# Patient Record
Sex: Male | Born: 1945
Health system: Southern US, Community
[De-identification: ages and names within clinical notes are randomized; demographics above are authoritative.]

## PROBLEM LIST (undated history)

## (undated) DIAGNOSIS — D589 Hereditary hemolytic anemia, unspecified: Principal | ICD-10-CM

## (undated) DIAGNOSIS — R011 Cardiac murmur, unspecified: Secondary | ICD-10-CM

## (undated) DIAGNOSIS — Z9989 Dependence on other enabling machines and devices: Secondary | ICD-10-CM

## (undated) DIAGNOSIS — R0600 Dyspnea, unspecified: Secondary | ICD-10-CM

## (undated) DIAGNOSIS — I77810 Thoracic aortic ectasia: Secondary | ICD-10-CM

## (undated) DIAGNOSIS — I499 Cardiac arrhythmia, unspecified: Secondary | ICD-10-CM

## (undated) DIAGNOSIS — C801 Malignant (primary) neoplasm, unspecified: Secondary | ICD-10-CM

## (undated) DIAGNOSIS — J449 Chronic obstructive pulmonary disease, unspecified: Secondary | ICD-10-CM

## (undated) DIAGNOSIS — G4733 Obstructive sleep apnea (adult) (pediatric): Secondary | ICD-10-CM

## (undated) DIAGNOSIS — R17 Unspecified jaundice: Secondary | ICD-10-CM

## (undated) DIAGNOSIS — I10 Essential (primary) hypertension: Secondary | ICD-10-CM

## (undated) DIAGNOSIS — I480 Paroxysmal atrial fibrillation: Secondary | ICD-10-CM

## (undated) DIAGNOSIS — R0609 Other forms of dyspnea: Secondary | ICD-10-CM

## (undated) DIAGNOSIS — R7303 Prediabetes: Secondary | ICD-10-CM

## (undated) DIAGNOSIS — K429 Umbilical hernia without obstruction or gangrene: Secondary | ICD-10-CM

## (undated) DIAGNOSIS — E785 Hyperlipidemia, unspecified: Secondary | ICD-10-CM

## (undated) DIAGNOSIS — Z9889 Other specified postprocedural states: Secondary | ICD-10-CM

## (undated) DIAGNOSIS — D591 Other autoimmune hemolytic anemias: Principal | ICD-10-CM

## (undated) DIAGNOSIS — M1990 Unspecified osteoarthritis, unspecified site: Secondary | ICD-10-CM

## (undated) DIAGNOSIS — K760 Fatty (change of) liver, not elsewhere classified: Secondary | ICD-10-CM

## (undated) HISTORY — DX: Chronic obstructive pulmonary disease, unspecified: J44.9

## (undated) HISTORY — DX: Hyperlipidemia, unspecified: E78.5

## (undated) HISTORY — DX: Essential (primary) hypertension: I10

## (undated) HISTORY — PX: SKIN SURGERY: SHX2413

## (undated) HISTORY — DX: Dependence on other enabling machines and devices: Z99.89

## (undated) HISTORY — DX: Obstructive sleep apnea (adult) (pediatric): G47.33

## (undated) HISTORY — DX: Other specified postprocedural states: Z98.890

## (undated) HISTORY — DX: Thoracic aortic ectasia: I77.810

## (undated) HISTORY — DX: Prediabetes: R73.03

## (undated) HISTORY — DX: Unspecified jaundice: R17

## (undated) HISTORY — DX: Hereditary hemolytic anemia, unspecified: D58.9

## (undated) HISTORY — PX: ADENOIDECTOMY: SUR15

## (undated) HISTORY — DX: Paroxysmal atrial fibrillation: I48.0

## (undated) HISTORY — DX: Fatty (change of) liver, not elsewhere classified: K76.0

## (undated) HISTORY — DX: Other forms of dyspnea: R06.09

## (undated) HISTORY — DX: Umbilical hernia without obstruction or gangrene: K42.9

## (undated) HISTORY — DX: Dyspnea, unspecified: R06.00

## (undated) HISTORY — DX: Nonrheumatic aortic (valve) stenosis: I35.0

## (undated) HISTORY — DX: Other autoimmune hemolytic anemias: D59.1

## (undated) HISTORY — PX: TONSILLECTOMY: SUR1361

---

## 2000-05-14 ENCOUNTER — Ambulatory Visit (HOSPITAL_COMMUNITY): Admission: RE | Admit: 2000-05-14 | Discharge: 2000-05-14 | Payer: Self-pay | Admitting: Gastroenterology

## 2000-05-23 ENCOUNTER — Inpatient Hospital Stay (HOSPITAL_COMMUNITY): Admission: EM | Admit: 2000-05-23 | Discharge: 2000-05-24 | Payer: Self-pay | Admitting: *Deleted

## 2000-06-10 ENCOUNTER — Encounter: Admission: RE | Admit: 2000-06-10 | Discharge: 2000-06-10 | Payer: Self-pay | Admitting: Family Medicine

## 2000-06-10 ENCOUNTER — Encounter: Payer: Self-pay | Admitting: Family Medicine

## 2002-03-04 ENCOUNTER — Emergency Department (HOSPITAL_COMMUNITY): Admission: EM | Admit: 2002-03-04 | Discharge: 2002-03-04 | Payer: Self-pay | Admitting: Emergency Medicine

## 2002-03-04 ENCOUNTER — Encounter: Payer: Self-pay | Admitting: Family Medicine

## 2003-02-16 ENCOUNTER — Ambulatory Visit (HOSPITAL_BASED_OUTPATIENT_CLINIC_OR_DEPARTMENT_OTHER): Admission: RE | Admit: 2003-02-16 | Discharge: 2003-02-16 | Payer: Self-pay | Admitting: Pulmonary Disease

## 2003-03-13 HISTORY — PX: NECK SURGERY: SHX720

## 2003-07-14 ENCOUNTER — Inpatient Hospital Stay (HOSPITAL_COMMUNITY): Admission: RE | Admit: 2003-07-14 | Discharge: 2003-07-15 | Payer: Self-pay | Admitting: Neurosurgery

## 2004-10-10 ENCOUNTER — Ambulatory Visit: Payer: Self-pay | Admitting: Cardiology

## 2005-05-01 ENCOUNTER — Ambulatory Visit: Payer: Self-pay | Admitting: Cardiovascular Disease

## 2005-05-07 ENCOUNTER — Ambulatory Visit: Payer: Self-pay | Admitting: Internal Medicine

## 2005-06-12 ENCOUNTER — Ambulatory Visit: Payer: Self-pay | Admitting: Cardiology

## 2005-07-24 ENCOUNTER — Ambulatory Visit: Payer: Self-pay | Admitting: Cardiology

## 2006-01-25 ENCOUNTER — Ambulatory Visit: Payer: Self-pay | Admitting: Cardiology

## 2007-02-27 ENCOUNTER — Ambulatory Visit: Payer: Self-pay | Admitting: Cardiology

## 2007-11-27 DIAGNOSIS — K2100 Gastro-esophageal reflux disease with esophagitis, without bleeding: Secondary | ICD-10-CM | POA: Insufficient documentation

## 2008-02-10 ENCOUNTER — Ambulatory Visit: Payer: Self-pay | Admitting: Cardiology

## 2008-02-13 ENCOUNTER — Ambulatory Visit: Payer: Self-pay

## 2008-03-01 DIAGNOSIS — E785 Hyperlipidemia, unspecified: Secondary | ICD-10-CM | POA: Insufficient documentation

## 2008-03-01 DIAGNOSIS — I10 Essential (primary) hypertension: Secondary | ICD-10-CM | POA: Insufficient documentation

## 2008-03-02 ENCOUNTER — Ambulatory Visit: Payer: Self-pay | Admitting: Critical Care Medicine

## 2008-03-02 DIAGNOSIS — G4733 Obstructive sleep apnea (adult) (pediatric): Secondary | ICD-10-CM | POA: Insufficient documentation

## 2008-03-02 DIAGNOSIS — R0989 Other specified symptoms and signs involving the circulatory and respiratory systems: Secondary | ICD-10-CM

## 2008-03-02 DIAGNOSIS — R0609 Other forms of dyspnea: Secondary | ICD-10-CM | POA: Insufficient documentation

## 2008-03-15 ENCOUNTER — Ambulatory Visit: Payer: Self-pay | Admitting: Critical Care Medicine

## 2008-03-18 ENCOUNTER — Encounter: Payer: Self-pay | Admitting: Critical Care Medicine

## 2008-03-24 ENCOUNTER — Ambulatory Visit: Payer: Self-pay | Admitting: Critical Care Medicine

## 2008-03-31 ENCOUNTER — Telehealth (INDEPENDENT_AMBULATORY_CARE_PROVIDER_SITE_OTHER): Payer: Self-pay | Admitting: *Deleted

## 2008-12-01 ENCOUNTER — Encounter (INDEPENDENT_AMBULATORY_CARE_PROVIDER_SITE_OTHER): Payer: Self-pay | Admitting: *Deleted

## 2008-12-15 DIAGNOSIS — I10 Essential (primary) hypertension: Secondary | ICD-10-CM | POA: Insufficient documentation

## 2009-01-11 ENCOUNTER — Ambulatory Visit: Payer: Self-pay | Admitting: Cardiology

## 2009-01-11 ENCOUNTER — Encounter (INDEPENDENT_AMBULATORY_CARE_PROVIDER_SITE_OTHER): Payer: Self-pay | Admitting: *Deleted

## 2009-02-14 ENCOUNTER — Telehealth (INDEPENDENT_AMBULATORY_CARE_PROVIDER_SITE_OTHER): Payer: Self-pay | Admitting: *Deleted

## 2009-02-16 ENCOUNTER — Ambulatory Visit (HOSPITAL_COMMUNITY): Admission: RE | Admit: 2009-02-16 | Discharge: 2009-02-16 | Payer: Self-pay | Admitting: Surgery

## 2009-02-24 ENCOUNTER — Encounter: Payer: Self-pay | Admitting: Cardiology

## 2009-03-12 HISTORY — PX: HIATAL HERNIA REPAIR: SHX195

## 2009-07-11 ENCOUNTER — Encounter: Payer: Self-pay | Admitting: Critical Care Medicine

## 2009-07-12 ENCOUNTER — Telehealth (INDEPENDENT_AMBULATORY_CARE_PROVIDER_SITE_OTHER): Payer: Self-pay | Admitting: *Deleted

## 2009-08-10 ENCOUNTER — Ambulatory Visit: Payer: Self-pay | Admitting: Critical Care Medicine

## 2009-08-10 DIAGNOSIS — J449 Chronic obstructive pulmonary disease, unspecified: Secondary | ICD-10-CM | POA: Insufficient documentation

## 2009-12-20 ENCOUNTER — Encounter: Payer: Self-pay | Admitting: Cardiology

## 2009-12-20 DIAGNOSIS — J439 Emphysema, unspecified: Secondary | ICD-10-CM | POA: Insufficient documentation

## 2010-01-04 ENCOUNTER — Inpatient Hospital Stay (HOSPITAL_COMMUNITY): Admission: EM | Admit: 2010-01-04 | Discharge: 2010-01-05 | Payer: Self-pay | Admitting: Emergency Medicine

## 2010-01-10 ENCOUNTER — Ambulatory Visit: Payer: Self-pay | Admitting: Cardiology

## 2010-01-10 ENCOUNTER — Encounter (INDEPENDENT_AMBULATORY_CARE_PROVIDER_SITE_OTHER): Payer: Self-pay | Admitting: *Deleted

## 2010-01-10 DIAGNOSIS — IMO0002 Reserved for concepts with insufficient information to code with codable children: Secondary | ICD-10-CM | POA: Insufficient documentation

## 2010-01-10 DIAGNOSIS — M1991 Primary osteoarthritis, unspecified site: Secondary | ICD-10-CM | POA: Insufficient documentation

## 2010-01-10 DIAGNOSIS — E875 Hyperkalemia: Secondary | ICD-10-CM | POA: Insufficient documentation

## 2010-01-16 ENCOUNTER — Telehealth: Payer: Self-pay | Admitting: Cardiology

## 2010-02-14 DIAGNOSIS — I70229 Atherosclerosis of native arteries of extremities with rest pain, unspecified extremity: Secondary | ICD-10-CM | POA: Insufficient documentation

## 2010-02-14 DIAGNOSIS — M48062 Spinal stenosis, lumbar region with neurogenic claudication: Secondary | ICD-10-CM | POA: Insufficient documentation

## 2010-02-14 DIAGNOSIS — N189 Chronic kidney disease, unspecified: Secondary | ICD-10-CM | POA: Insufficient documentation

## 2010-04-09 LAB — CONVERTED CEMR LAB
BUN: 39 mg/dL — ABNORMAL HIGH (ref 6–23)
CO2: 27 meq/L (ref 19–32)
Calcium: 9.8 mg/dL (ref 8.4–10.5)
Chloride: 98 meq/L (ref 96–112)
Creatinine, Ser: 1.5 mg/dL (ref 0.4–1.5)
GFR calc non Af Amer: 49.87 mL/min (ref >60)
Glucose, Bld: 95 mg/dL (ref 70–99)
Potassium: 4.6 meq/L (ref 3.5–5.1)
Sodium: 133 meq/L — ABNORMAL LOW (ref 135–145)

## 2010-04-13 NOTE — Assessment & Plan Note (Signed)
Summary: yearly f/u /cy  Medications Added FLOMAX 0.4 MG CAPS (TAMSULOSIN HCL) 1 cap once daily        Visit Type:  EPH Referring Provider:  Dr. Mar Daring Primary Provider:  Dr. Florina Ou  CC:  pt was in [facility] for hyperkalemia....denies any other complaints today.  History of Present Illness: [name] returns today after being discharged the hospital with leg pain and hyperkalemia. Please see the discharge summary.  His spironolactone and ACE inhibitor were discontinued. Remarkably, blood pressures have been excellent. His log shows an average about 1:15 to 125/75-80. Heart rate in the 60s.  He needs a set of electrolytes today.  His weight is up he is prediabetic per primary care. He's lost 6 pounds his hearing this. His lose another 15-20.  Note his creatinine was 1.7 on admission. Ultrasound and CT of the kidneys were normal in the hospital.  Current Medications (verified): 1)  Atenolol 50 Mg Tabs (Atenolol) .... Once Daily 2)  Norvasc 10 Mg Tabs (Amlodipine Besylate) .Marland Kitchen.. 1 By Mouth Daily 3)  Lipitor 20 Mg Tabs (Atorvastatin Calcium) .Marland Kitchen.. 1 By Mouth At Bedtime 4)  Aspirin 325 Mg Tabs (Aspirin) .Marland Kitchen.. 1 By Mouth Daily 5)  Multivitamins  Tabs (Multiple Vitamin) .Marland Kitchen.. 1 By Mouth Daily 6)  Hydrochlorothiazide 50 Mg Tabs (Hydrochlorothiazide) .Marland Kitchen.. 1 By Mouth Daily 7)  Clonidine Hcl 0.1 Mg Tabs (Clonidine Hcl) .Marland Kitchen.. 1 By Mouth Two Times A Day 8)  Lovaza 1 Gm Caps (Omega-3-Acid Ethyl Esters) .... 2 By Mouth Two Times A Day 9)  Spiriva Handihaler 18 Mcg Caps (Tiotropium Bromide Monohydrate) .... Inhale 1 Capsule Daily Using Handihaler 10)  Flomax 0.4 Mg Caps (Tamsulosin Hcl) .Marland Kitchen.. 1 Cap Once Daily  Allergies: 1)  ! Codeine  Past History:  Past Medical History: Last updated: 01/06/2009 DYSPNEA ON EXERTION (ICD-786.09) HYPERTENSION (ICD-401.9) HYPERLIPIDEMIA (ICD-272.4) SLEEP APNEA (ICD-780.57)    Past Surgical History: Last updated: 08/10/2009 Tonsillectomy Skin cancer   Neck surgery 8891 Umbilical hernia repair  Family History: Last updated: 01/06/2009 Family History Coronary Heart Disease mother and father   Social History: Last updated: 01/06/2009 married and lives with wife has children 1 dog occupation: realtor  Risk Factors: Alcohol Use: <1 (03/02/2008)  Risk Factors: Smoking Status: quit > 6 months (08/10/2009) Passive Smoke Exposure: yes (08/10/2009)  Review of Systems       negative other than history of present illness  Vital Signs:  Patient profile:   65 year old male Height:      73 inches Weight:      238 pounds BMI:     31.51 Pulse rate:   63 / minute Pulse rhythm:   irregular BP sitting:   110 / 62  (left arm) Cuff size:   large  Vitals Entered By: Julaine Hua, CMA (January 10, 2010 10:17 AM)  Physical Exam  General:  obese.   Head:  normocephalic and atraumatic Eyes:  PERRLA/EOM intact; conjunctiva and lids normal. Neck:  Neck supple, no JVD. No masses, thyromegaly or abnormal cervical nodes. Lungs:  Clear bilaterally to auscultation and percussion. Heart:  PMI nondisplaced, normal S1-S2, no murmur or gallop. No obvious carotid bruit Abdomen:  distended, positive bowel sounds, nontender, no midline bruit or flank bruit Msk:  Back normal, normal gait. Muscle strength and tone normal. Pulses:  pulses normal in all 4 extremities Extremities:  No clubbing or cyanosis. Neurologic:  Alert and oriented x 3. Skin:  Intact without lesions or rashes. Psych:  Normal affect.  EKG  Procedure date:  01/10/2010  Findings:      normal sinus rhythm, ST segment changes, no acute changes.  Impression & Recommendations:  Problem # 1:  HYPERTENSION (ICD-401.9) Assessment Improved  we'll check electrolytes today. Her potassium is normal, will not restrict fruits and vegetables. He is losing weight but also prediabetic condition she also has blood pressure. He is also not she would fall. With normal blood pressure  we'll not pursue renovascular studies. The following medications were removed from the medication list:    Benazepril Hcl 40 Mg Tabs (Benazepril hcl) .Marland Kitchen... 1 by mouth daily    Spironolactone 25 Mg Tabs (Spironolactone) .Marland Kitchen... 1 by mouth two times a day His updated medication list for this problem includes:    Atenolol 50 Mg Tabs (Atenolol) ..... Once daily    Norvasc 10 Mg Tabs (Amlodipine besylate) .Marland Kitchen... 1 by mouth daily    Aspirin 325 Mg Tabs (Aspirin) .Marland Kitchen... 1 by mouth daily    Hydrochlorothiazide 50 Mg Tabs (Hydrochlorothiazide) .Marland Kitchen... 1 by mouth daily    Clonidine Hcl 0.1 Mg Tabs (Clonidine hcl) .Marland Kitchen... 1 by mouth two times a day  Orders: TLB-BMP (Basic Metabolic Panel-BMET) (13887-[id number])  Problem # 2:  HYPERLIPIDEMIA (ICD-272.4)  His updated medication list for this problem includes:    Lipitor 20 Mg Tabs (Atorvastatin calcium) .Marland Kitchen... 1 by mouth at bedtime    Lovaza 1 Gm Caps (Omega-3-acid ethyl esters) .Marland Kitchen... 2 by mouth two times a day  Problem # 3:  SLEEP APNEA (ICD-780.57)  Patient Instructions: 1)  Your physician recommends that you schedule a follow-up appointment in: 6 months 2)  Your physician recommends that you return for lab work in: today--BMET

## 2010-04-13 NOTE — Assessment & Plan Note (Signed)
Summary: Pulmonary OV   Copy to:  Dr. Mar Daring Primary Provider/Referring Provider:  Dr. Florina Ou  CC:  Yearly follow up for refills.  Pt last seen 02/2008.  Pt states breathing is doing well overall.  Denies wheezing, chest tightness, and cough.  Requesting Spiriva Rx-[facility].  .  History of Present Illness: Pulmonary OV       66 yo WM with mild copd on spiriva ex smoker, moderate obesity  August 10, 2009 Last seen 2009.  On spiriva and doing well. Still some dyspnea wiht exertion, gaining weight. Pt denies any significant sore throat, nasal congestion or excess secretions, fever, chills, sweats, unintended weight loss, pleurtic or exertional chest pain, orthopnea PND, or leg swelling Pt denies any increase in rescue therapy over baseline, denies waking up needing it or having any early am or nocturnal exacerbations of coughing/wheezing/or dyspnea.   Preventive Screening-Counseling & Management  Alcohol-Tobacco     Smoking Status: quit > 6 months     Year Quit: 1970s     Passive Smoke Exposure: yes  Current Medications (verified): 1)  Atenolol 50 Mg Tabs (Atenolol) .... Once Daily 2)  Benazepril Hcl 40 Mg Tabs (Benazepril Hcl) .Marland Kitchen.. 1 By Mouth Daily 3)  Norvasc 10 Mg Tabs (Amlodipine Besylate) .Marland Kitchen.. 1 By Mouth Daily 4)  Lipitor 20 Mg Tabs (Atorvastatin Calcium) .Marland Kitchen.. 1 By Mouth At Bedtime 5)  Nexium 40 Mg Cpdr (Esomeprazole Magnesium) .Marland Kitchen.. 1 By Mouth Daily 6)  Aspirin 325 Mg Tabs (Aspirin) .Marland Kitchen.. 1 By Mouth Daily 7)  Multivitamins  Tabs (Multiple Vitamin) .Marland Kitchen.. 1 By Mouth Daily 8)  Klor-Con M20 20 Meq Cr-Tabs (Potassium Chloride Crys Cr) .Marland Kitchen.. 1 By Mouth Two Times A Day 9)  Hydrochlorothiazide 50 Mg Tabs (Hydrochlorothiazide) .Marland Kitchen.. 1 By Mouth Daily 10)  Clonidine Hcl 0.1 Mg Tabs (Clonidine Hcl) .Marland Kitchen.. 1 By Mouth Two Times A Day 11)  Spironolactone 25 Mg Tabs (Spironolactone) .Marland Kitchen.. 1 By Mouth Two Times A Day 12)  Lovaza 1 Gm Caps (Omega-3-Acid Ethyl Esters) .... 2 By Mouth Two Times A  Day 13)  Spiriva Handihaler 18 Mcg Caps (Tiotropium Bromide Monohydrate) .... Inhale 1 Capsule Daily Using Handihaler  Allergies (verified): 1)  ! Codeine  Past History:  Past medical, surgical, family and social histories (including risk factors) reviewed, and no changes noted (except as noted below).  Past Medical History: Reviewed history from 01/06/2009 and no changes required. DYSPNEA ON EXERTION (ICD-786.09) HYPERTENSION (ICD-401.9) HYPERLIPIDEMIA (ICD-272.4) SLEEP APNEA (ICD-780.57)    Past Surgical History: Tonsillectomy Skin cancer  Neck surgery 3335 Umbilical hernia repair  Family History: Reviewed history from 01/06/2009 and no changes required. Family History Coronary Heart Disease mother and father   Social History: Reviewed history from 01/06/2009 and no changes required. married and lives with wife has children 1 dog occupation: Cabin crew Smoking Status:  quit > 6 months Passive Smoke Exposure:  yes  Review of Systems       The patient complains of shortness of breath with activity.  The patient denies shortness of breath at rest, productive cough, non-productive cough, coughing up blood, chest pain, irregular heartbeats, acid heartburn, indigestion, loss of appetite, weight change, abdominal pain, difficulty swallowing, sore throat, tooth/dental problems, headaches, nasal congestion/difficulty breathing through nose, sneezing, itching, ear ache, anxiety, depression, hand/feet swelling, joint stiffness or pain, rash, change in color of mucus, and fever.    Vital Signs:  Patient profile:   65 year old male Height:      73 inches Weight:  247 pounds BMI:     32.71 O2 Sat:      94 % on Room air Temp:     98.3 degrees F oral Pulse rate:   59 / minute BP sitting:   114 / 66  (right arm) Cuff size:   large  Vitals Entered By: Raymondo Band RN (August 10, 2009 4:03 PM)  Nutrition Counseling: Patient's BMI is greater than 25 and therefore counseled on  weight management options.  O2 Flow:  Room air CC: Yearly follow up for refills.  Pt last seen 02/2008.  Pt states breathing is doing well overall.  Denies wheezing, chest tightness, cough.  Requesting Spiriva Rx-[facility].   Comments Medications reviewed with patient Daytime contact number verified with patient. Raymondo Band RN  August 10, 2009 4:03 PM    Physical Exam  Additional Exam:  Gen: Pleasant, well-nourished, in no distress , normal affect ENT: no lesions, no post nasal drip Neck: No JVD, no TMG, no carotid bruits Lungs: No use of accessory muscles, no dullness to percussion, clear without rales or rhonchi, distant bs Cardiovascular: RRR, heart sounds normal, no murmurs or gallops, no peripheral edema Abdomen: soft and non-tender, no HSM, BS normal Musculoskeletal: No deformities, no cyanosis or clubbing Neuro: alert, non-focal     Pulmonary Function Test Date: 08/10/2009 4:17 PM Gender: Male  Pre-Spirometry FVC    Value: 3.63 L/min   % Pred: 69.90 % FEV1    Value: 2.72 L     Pred: 3.91 L     % Pred: 69.60 % FEV1/FVC  Value: 74.91 %     % Pred: 99.50 %  Impression & Recommendations:  Problem # 1:  COPD, MILD (ICD-496) Assessment Improved mild copd golds stage I better wiht spiriva needs to lose weight plan weight loss program cont spiriva  Complete Medication List: 1)  Atenolol 50 Mg Tabs (Atenolol) .... Once daily 2)  Benazepril Hcl 40 Mg Tabs (Benazepril hcl) .Marland Kitchen.. 1 by mouth daily 3)  Norvasc 10 Mg Tabs (Amlodipine besylate) .Marland Kitchen.. 1 by mouth daily 4)  Lipitor 20 Mg Tabs (Atorvastatin calcium) .Marland Kitchen.. 1 by mouth at bedtime 5)  Nexium 40 Mg Cpdr (Esomeprazole magnesium) .Marland Kitchen.. 1 by mouth daily 6)  Aspirin 325 Mg Tabs (Aspirin) .Marland Kitchen.. 1 by mouth daily 7)  Multivitamins Tabs (Multiple vitamin) .Marland Kitchen.. 1 by mouth daily 8)  Klor-con M20 20 Meq Cr-tabs (Potassium chloride crys cr) .Marland Kitchen.. 1 by mouth two times a day 9)  Hydrochlorothiazide 50 Mg Tabs (Hydrochlorothiazide) .Marland Kitchen.. 1  by mouth daily 10)  Clonidine Hcl 0.1 Mg Tabs (Clonidine hcl) .Marland Kitchen.. 1 by mouth two times a day 11)  Spironolactone 25 Mg Tabs (Spironolactone) .Marland Kitchen.. 1 by mouth two times a day 12)  Lovaza 1 Gm Caps (Omega-3-acid ethyl esters) .... 2 by mouth two times a day 13)  Spiriva Handihaler 18 Mcg Caps (Tiotropium bromide monohydrate) .... Inhale 1 capsule daily using handihaler  Other Orders: Spirometry w/Graph (94010) Est. Patient Level III (62263)  Patient Instructions: 1)  Continue spiriva daily 2)  Focus on weight loss 3)  Return 12 months or sooner if necessary Prescriptions: SPIRIVA HANDIHALER 18 MCG CAPS (TIOTROPIUM BROMIDE MONOHYDRATE) Inhale 1 capsule daily using handihaler  #90 x 4   Entered and Authorized by:   Elsie Stain MD   Signed by:   Elsie Stain MD on 08/10/2009   Method used:   Electronically to        [facility] (retail)       [place], [address]  [id number]       Ph: [phone] or [phone]       Fax: [phone]   RxID:   [id number]    [facility] Spirometry  ID: [id number] Patient: RUSHAWN, CAPSHAW DOB: [birth year] Age: 65 Years Old Sex: Male Race: White Physician: Dr. Asencion Noble Height: 73 Weight: 247 Status: Unconfirmed Past Medical History:  DYSPNEA ON EXERTION (ICD-786.09) HYPERTENSION (ICD-401.9) HYPERLIPIDEMIA (ICD-272.4) SLEEP APNEA (ICD-780.57)    Recorded: 08/10/2009 4:17 PM  Parameter  Measured Predicted %Predicted FVC     3.63        5.19        69.90 FEV1     2.72        3.91        69.60 FEV1%   74.91        75.26        99.50 PEF    8.16        9.72        83.90   Interpretation: Mild restriction,  no obstruction  Appended Document: Pulmonary OV fax tammy spears

## 2010-04-13 NOTE — Miscellaneous (Signed)
Summary: spiriva denial   Clinical Lists Changes    received a refill request from [facility] for pt spiriva. refill denied because pt last seen in 02/2008 with no appending appts. [employer] CMA  Jul 11, 2009 10:34 AM

## 2010-04-13 NOTE — Progress Notes (Signed)
Summary: spiriva rx  Phone Note Call from Patient Call back at Home Phone [phone]   Caller: Patient Call For: wright Reason for Call: Refill Medication, Talk to Nurse Summary of Call: pt schedulled to come in on 08/10/2009 @ 4:00pm.  Can you go ahead and fill his Spiriva w/ [facility] one time? Initial call taken by: Zigmund Gottron,  Jul 12, 2009 9:58 AM  Follow-up for Phone Call        [facility] TCB for pt: does he have enough spiriva at home to wait the few days for [facility] to ship his rx? - and MUST keep appt in June to receive future refills. Parke Poisson CNA  Jul 12, 2009 10:35 AM   pt returned call.  pt states that he has 10days of spiriva left that should last him until the rx arrives from [facility].  reminded pt to keep his upcoming appt with PW.  pt verbalized his understanding.  rx sent to pharmacy. Follow-up by: Parke Poisson CNA,  Jul 12, 2009 12:01 PM    Prescriptions: SPIRIVA HANDIHALER 18 MCG CAPS (TIOTROPIUM BROMIDE MONOHYDRATE) Inhale 1 capsule daily using handihaler  #90 x 4   Entered by:   Parke Poisson CNA   Authorized by:   Elsie Stain MD   Signed by:   Parke Poisson CNA on 07/12/2009   Method used:   Electronically to        [facility] (mail-order)             ,          Ph: [phone]       Fax: [phone]   RxID:   [id number]

## 2010-04-13 NOTE — Progress Notes (Signed)
Summary: lab results   Phone Note Call from Patient   Reason for Call: Talk to Nurse, Lab or Test Results Summary of Call: pt calling for lab results from [phone] Initial call taken by: Lorenda Hatchet,  January 16, 2010 2:53 PM  Follow-up for Phone Call        Pt aware of lab results and recommendations. Horton Chin RN

## 2010-04-13 NOTE — Miscellaneous (Signed)
Summary: allergy update  Clinical Lists Changes  Allergies: Added new allergy or adverse reaction of ACE INHIBITORS Added new allergy or adverse reaction of * SPIRONOLACTONE

## 2010-04-13 NOTE — Letter (Signed)
Summary: Dr Rodman Key Tsuei's Office Note  Dr Rodman Key Tsuei's Office Note   Imported By: Sallee Provencal 04/05/2009 11:57:48  _____________________________________________________________________  External Attachment:    Type:   Image     Comment:   External Document

## 2010-04-13 NOTE — Letter (Signed)
Summary: [facility]   Imported By: Marilynne Drivers 01/11/2010 14:48:00  _____________________________________________________________________  External Attachment:    Type:   Image     Comment:   External Document

## 2010-05-24 LAB — CBC
HCT: 38.3 % — ABNORMAL LOW (ref 39.0–52.0)
MCH: 32.3 pg (ref 26.0–34.0)
MCH: 33.9 pg (ref 26.0–34.0)
MCHC: 33.9 g/dL (ref 30.0–36.0)
MCV: 93.6 fL (ref 78.0–100.0)
MCV: 95 fL (ref 78.0–100.0)
Platelets: 215 10*3/uL (ref 150–400)
Platelets: 235 10*3/uL (ref 150–400)
RDW: 14.5 % (ref 11.5–15.5)

## 2010-05-24 LAB — BASIC METABOLIC PANEL
BUN: 34 mg/dL — ABNORMAL HIGH (ref 6–23)
CO2: 18 mEq/L — ABNORMAL LOW (ref 19–32)
CO2: 20 mEq/L (ref 19–32)
CO2: 21 mEq/L (ref 19–32)
CO2: 22 mEq/L (ref 19–32)
Calcium: 9.8 mg/dL (ref 8.4–10.5)
Chloride: 107 mEq/L (ref 96–112)
Chloride: 112 mEq/L (ref 96–112)
Creatinine, Ser: 1.28 mg/dL (ref 0.4–1.5)
Creatinine, Ser: 1.35 mg/dL (ref 0.4–1.5)
Creatinine, Ser: 1.43 mg/dL (ref 0.4–1.5)
GFR calc Af Amer: >60 mL/min (ref >60)
GFR calc Af Amer: >60 mL/min (ref >60)
GFR calc non Af Amer: 39 mL/min — ABNORMAL LOW (ref >60)
Glucose, Bld: 110 mg/dL — ABNORMAL HIGH (ref 70–99)
Glucose, Bld: 128 mg/dL — ABNORMAL HIGH (ref 70–99)
Potassium: 5.1 mEq/L (ref 3.5–5.1)
Potassium: 5.2 mEq/L — ABNORMAL HIGH (ref 3.5–5.1)

## 2010-05-24 LAB — URINALYSIS, ROUTINE W REFLEX MICROSCOPIC
Nitrite: NEGATIVE
Specific Gravity, Urine: 1.023 (ref 1.005–1.030)
Urobilinogen, UA: 0.2 mg/dL (ref 0.0–1.0)

## 2010-05-24 LAB — RAPID URINE DRUG SCREEN, HOSP PERFORMED
Amphetamines: NOT DETECTED
Benzodiazepines: NOT DETECTED
Opiates: NOT DETECTED
Tetrahydrocannabinol: NOT DETECTED

## 2010-05-24 LAB — POCT I-STAT, CHEM 8
BUN: 57 mg/dL — ABNORMAL HIGH (ref 6–23)
Calcium, Ion: 1.22 mmol/L (ref 1.12–1.32)
Chloride: 114 mEq/L — ABNORMAL HIGH (ref 96–112)
Creatinine, Ser: 1.9 mg/dL — ABNORMAL HIGH (ref 0.4–1.5)
Potassium: 7.5 mEq/L (ref 3.5–5.1)
Sodium: 132 mEq/L — ABNORMAL LOW (ref 135–145)
TCO2: 17 mmol/L (ref 0–100)

## 2010-05-24 LAB — PHOSPHORUS
Phosphorus: 4.2 mg/dL (ref 2.3–4.6)
Phosphorus: 4.7 mg/dL — ABNORMAL HIGH (ref 2.3–4.6)

## 2010-05-24 LAB — MAGNESIUM
Magnesium: 2.1 mg/dL (ref 1.5–2.5)
Magnesium: 2.1 mg/dL (ref 1.5–2.5)

## 2010-05-24 LAB — DIFFERENTIAL
Lymphs Abs: 1.5 10*3/uL (ref 0.7–4.0)
Neutrophils Relative %: 77 % (ref 43–77)

## 2010-05-24 LAB — HEMOGLOBIN A1C: Mean Plasma Glucose: 126 mg/dL — ABNORMAL HIGH (ref <117)

## 2010-05-24 LAB — CREATININE, URINE, RANDOM: Creatinine, Urine: 141.9 mg/dL

## 2010-06-07 DIAGNOSIS — I4891 Unspecified atrial fibrillation: Secondary | ICD-10-CM | POA: Insufficient documentation

## 2010-06-08 ENCOUNTER — Encounter: Payer: Self-pay | Admitting: Cardiology

## 2010-06-09 ENCOUNTER — Telehealth: Payer: Self-pay | Admitting: Cardiology

## 2010-06-09 ENCOUNTER — Encounter: Payer: Self-pay | Admitting: Cardiology

## 2010-06-09 ENCOUNTER — Ambulatory Visit (INDEPENDENT_AMBULATORY_CARE_PROVIDER_SITE_OTHER): Payer: BC Managed Care – PPO | Admitting: Cardiology

## 2010-06-09 DIAGNOSIS — I4891 Unspecified atrial fibrillation: Secondary | ICD-10-CM

## 2010-06-09 DIAGNOSIS — I480 Paroxysmal atrial fibrillation: Secondary | ICD-10-CM

## 2010-06-09 DIAGNOSIS — Z7901 Long term (current) use of anticoagulants: Secondary | ICD-10-CM

## 2010-06-09 NOTE — Patient Instructions (Signed)
Your physician recommends that you schedule a follow-up appointment in:  3 months with Dr. Verl Blalock  Please avoid more than 1 alcoholic beverage per day  Your physician has requested that you have an echocardiogram. Echocardiography is a painless test that uses sound waves to create images of your heart. It provides your doctor with information about the size and shape of your heart and how well your heart's chambers and valves are working. This procedure takes approximately one hour. There are no restrictions for this procedure.

## 2010-06-09 NOTE — Telephone Encounter (Signed)
Pt returning call for coumadin clinic appt on Monday 06/12/10 at 10:30am. Pt aware. Horton Chin RN

## 2010-06-09 NOTE — Progress Notes (Signed)
   Patient ID: Steven Shepard, male    DOB: 1945/04/04, 65 y.o.   MRN: [id number]  HPI  [name] come today referred by Dr Modena Morrow for new onset PAF. He has had brief episodes of palpitations over th past month or so. No CP, SOB,DOE, presyncope or syncope. Risks include HTN which at one point was difficult to control but has been at goal now for several yrs. He will have right hip surgery this July with Dr Maureen Ralphs. Dr Modena Morrow called and was advised by Dr Caryl Comes to start Coumadin and change atenolol to metoprolol.    Review of Systems  [all other systems reviewed and are negative      Physical Exam  Constitutional: He is oriented to person, place, and time. He appears well-developed and well-nourished.  HENT:  Head: Normocephalic and atraumatic.  Eyes: EOM are normal. Pupils are equal, round, and reactive to light.  Neck: Normal range of motion. Neck supple. No JVD present. No tracheal deviation present. No thyromegaly present.  Cardiovascular: Normal rate, regular rhythm, normal heart sounds and intact distal pulses.  Exam reveals no gallop.   No murmur heard. Pulmonary/Chest: Effort normal and breath sounds normal. He has no rales.  Abdominal: Soft. Bowel sounds are normal. He exhibits no distension. There is no tenderness.  Musculoskeletal: Normal range of motion. He exhibits no edema.  Neurological: He is alert and oriented to person, place, and time.  Skin: Skin is warm.  Psychiatric: He has a normal mood and affect. His behavior is normal. Judgment and thought content normal.

## 2010-06-09 NOTE — Assessment & Plan Note (Signed)
He is NSR today. His sxs of palpitations seem to correlate with drinking more than 2 alcoholic beverages. He has no sxs with 1 or less. He is not a binge drinker. Advised to keep to <2 and note any future episodes. Will continue Coumadin for now, but if Echo is normal will change to ECASA 360m/d. I will also clear him for surgery this July. We also spent a lot of time talking about Pradaxa vs Coumadin. He chose to stay on Coumadin.

## 2010-06-12 ENCOUNTER — Encounter: Payer: Self-pay | Admitting: Cardiology

## 2010-06-12 ENCOUNTER — Telehealth: Payer: Self-pay | Admitting: *Deleted

## 2010-06-12 ENCOUNTER — Ambulatory Visit (INDEPENDENT_AMBULATORY_CARE_PROVIDER_SITE_OTHER): Payer: BC Managed Care – PPO | Admitting: *Deleted

## 2010-06-12 DIAGNOSIS — I4891 Unspecified atrial fibrillation: Secondary | ICD-10-CM

## 2010-06-12 DIAGNOSIS — I480 Paroxysmal atrial fibrillation: Secondary | ICD-10-CM

## 2010-06-12 DIAGNOSIS — Z7901 Long term (current) use of anticoagulants: Secondary | ICD-10-CM

## 2010-06-12 LAB — POCT INR: INR: 2.6

## 2010-06-12 NOTE — Telephone Encounter (Signed)
Started in error--please disregard--nt

## 2010-06-12 NOTE — Patient Instructions (Signed)
INR 2.6 Continue taking 1 tablet (4m) daily.  Recheck in 1 week.

## 2010-06-13 LAB — BASIC METABOLIC PANEL
BUN: 23 mg/dL (ref 6–23)
CO2: 25 mEq/L (ref 19–32)
Chloride: 103 mEq/L (ref 96–112)
GFR calc Af Amer: >60 mL/min (ref >60)
Potassium: 5 mEq/L (ref 3.5–5.1)

## 2010-06-13 LAB — DIFFERENTIAL
Blasts: 0 %
Eosinophils Absolute: 0.1 10*3/uL (ref 0.0–0.7)
Eosinophils Absolute: 0.1 10*3/uL (ref 0.0–0.7)
Eosinophils Relative: 1 % (ref 0–5)
Eosinophils Relative: 1 % (ref 0–5)
Lymphs Abs: 1.9 10*3/uL (ref 0.7–4.0)
Metamyelocytes Relative: 0 %
Monocytes Relative: 6 % (ref 3–12)
Myelocytes: 0 %
Promyelocytes Absolute: 0 %
nRBC: 0 /100 WBC

## 2010-06-13 LAB — CBC
HCT: 36.8 % — ABNORMAL LOW (ref 39.0–52.0)
MCHC: 35.1 g/dL (ref 30.0–36.0)
MCV: 90.8 fL (ref 78.0–100.0)
Platelets: 209 10*3/uL (ref 150–400)
RDW: 14 % (ref 11.5–15.5)
WBC: 7.2 10*3/uL (ref 4.0–10.5)

## 2010-06-14 ENCOUNTER — Encounter: Payer: BC Managed Care – PPO | Admitting: *Deleted

## 2010-06-20 ENCOUNTER — Ambulatory Visit (INDEPENDENT_AMBULATORY_CARE_PROVIDER_SITE_OTHER): Payer: BC Managed Care – PPO | Admitting: *Deleted

## 2010-06-20 ENCOUNTER — Other Ambulatory Visit (HOSPITAL_COMMUNITY): Payer: Self-pay | Admitting: Cardiology

## 2010-06-20 DIAGNOSIS — I4891 Unspecified atrial fibrillation: Secondary | ICD-10-CM

## 2010-06-20 DIAGNOSIS — Z9229 Personal history of other drug therapy: Secondary | ICD-10-CM | POA: Insufficient documentation

## 2010-06-20 DIAGNOSIS — Z7901 Long term (current) use of anticoagulants: Secondary | ICD-10-CM

## 2010-06-20 DIAGNOSIS — I480 Paroxysmal atrial fibrillation: Secondary | ICD-10-CM

## 2010-06-21 ENCOUNTER — Encounter: Payer: Self-pay | Admitting: Cardiology

## 2010-06-22 ENCOUNTER — Ambulatory Visit (HOSPITAL_COMMUNITY): Payer: BC Managed Care – PPO | Attending: Cardiology

## 2010-06-22 ENCOUNTER — Encounter: Payer: Self-pay | Admitting: Cardiology

## 2010-06-22 DIAGNOSIS — Z7901 Long term (current) use of anticoagulants: Secondary | ICD-10-CM

## 2010-06-22 DIAGNOSIS — I4891 Unspecified atrial fibrillation: Secondary | ICD-10-CM

## 2010-06-22 DIAGNOSIS — R002 Palpitations: Secondary | ICD-10-CM

## 2010-06-26 ENCOUNTER — Encounter: Payer: Self-pay | Admitting: Critical Care Medicine

## 2010-06-27 ENCOUNTER — Ambulatory Visit (INDEPENDENT_AMBULATORY_CARE_PROVIDER_SITE_OTHER): Payer: BC Managed Care – PPO | Admitting: *Deleted

## 2010-06-27 DIAGNOSIS — I480 Paroxysmal atrial fibrillation: Secondary | ICD-10-CM

## 2010-06-27 DIAGNOSIS — I4891 Unspecified atrial fibrillation: Secondary | ICD-10-CM

## 2010-06-27 LAB — POCT INR: INR: 3.9

## 2010-06-28 ENCOUNTER — Encounter: Payer: Self-pay | Admitting: Critical Care Medicine

## 2010-06-28 ENCOUNTER — Ambulatory Visit (INDEPENDENT_AMBULATORY_CARE_PROVIDER_SITE_OTHER): Payer: BC Managed Care – PPO | Admitting: Critical Care Medicine

## 2010-06-28 VITALS — BP 138/80 | HR 72 | Temp 97.9°F | Ht 73.0 in | Wt 249.8 lb

## 2010-06-28 DIAGNOSIS — J449 Chronic obstructive pulmonary disease, unspecified: Secondary | ICD-10-CM

## 2010-06-28 NOTE — Progress Notes (Signed)
Subjective:    Patient ID: Steven Shepard, male    DOB: [birth year], 65 y.o.   MRN: [id number]  Shortness of Breath This is a chronic problem. The current episode started more than 1 year ago. The problem occurs daily (has dyspnea with exertion ? deconditioning.  will recover quickly.). The problem has been gradually worsening (d/t not able to exercise). The average episode lasts 1 minute. Associated symptoms include leg pain and leg swelling. Pertinent negatives include no chest pain, hemoptysis, orthopnea, PND, sputum production or wheezing. Exacerbated by: Up steps, up incline.   Associated symptoms comments: occ prod cough with allergies, has pn drip with this . He has tried ipratropium inhalers for the symptoms. The treatment provided mild relief. His past medical history is significant for COPD.  06/28/2010 F/u copd golds I.  Now needs THR,  No new issues.  See symptoms. Pt denies any significant sore throat, nasal congestion or excess secretions, fever, chills, sweats, unintended weight loss, pleurtic or exertional chest pain, orthopnea PND, or leg swelling Pt denies any increase in rescue therapy over baseline, denies waking up needing it or having any early am or nocturnal exacerbations of coughing/wheezing/or dyspnea. Pt also denies any obvious fluctuation in symptoms with  weather or environmental change or other alleviating or aggravating factors   Note: Pulmonary Function Test  Date: 08/10/2009 4:17 PM  Gender: Male  Pre-Spirometry  FVC Value: 3.63 L/min % Pred: 69.90 %  FEV1 Value: 2.72 L Pred: 3.91 L % Pred: 69.60 %  FEV1/FVC Value: 74.91 % % Pred: 99.50 %   Past Medical History  Diagnosis Date  . DOE (dyspnea on exertion)   . Hyperlipidemia   . Hypertension   . Sleep apnea   . History of neck surgery   . Umbilical hernia      Family History  Problem Relation Age of Onset  . Coronary artery disease Mother   . Coronary artery disease Father      History    Social History  . Marital Status: Married    Spouse Name: N/A    Number of Children: N/A  . Years of Education: N/A   Occupational History  . Realtor    Social History Main Topics  . Smoking status: Former Smoker -- 1.5 packs/day for 3 years    Types: Cigarettes, Pipe    Quit date: 03/12/1974  . Smokeless tobacco: Never Used   Comment: quit appox 36 years ago  . Alcohol Use: Yes     occasionally  . Drug Use: No  . Sexually Active: Not on file   Other Topics Concern  . Not on file   Social History Narrative  . No narrative on file     Allergies  Allergen Reactions  . Ace Inhibitors   . Codeine   . Spironolactone      Outpatient Prescriptions Prior to Visit  Medication Sig Dispense Refill  . aspirin 81 MG tablet Take 81 mg by mouth daily.        Marland Kitchen atorvastatin (LIPITOR) 20 MG tablet Take 20 mg by mouth daily.        . cloNIDine (CATAPRES) 0.1 MG tablet Take 0.1 mg by mouth 2 (two) times daily.        . metoprolol (TOPROL-XL) 50 MG 24 hr tablet Take 50 mg by mouth 2 (two) times daily.        . Multiple Vitamin (MULTIVITAMIN) capsule Take 1 capsule by mouth daily.        Marland Kitchen  omega-3 acid ethyl esters (LOVAZA) 1 G capsule Take 2 g by mouth 2 (two) times daily.       . ranitidine (ZANTAC) 75 MG tablet Take 75 mg by mouth as needed.        . Tamsulosin HCl (FLOMAX) 0.4 MG CAPS Take 0.4 mg by mouth daily.        Marland Kitchen tiotropium (SPIRIVA) 18 MCG inhalation capsule Place 18 mcg into inhaler and inhale daily.        Marland Kitchen warfarin (COUMADIN) 5 MG tablet Take by mouth as directed.       Marland Kitchen amLODipine (NORVASC) 10 MG tablet Take 10 mg by mouth daily.           Review of Systems  Respiratory: Positive for shortness of breath. Negative for hemoptysis, sputum production and wheezing.   Cardiovascular: Positive for leg swelling. Negative for chest pain, orthopnea and PND.       Objective:   Physical Exam Gen: Pleasant, obese , in no distress,  normal affect  ENT: No lesions,   mouth clear,  oropharynx clear, no postnasal drip  Neck: No JVD, no TMG, no carotid bruits  Lungs: No use of accessory muscles, no dullness to percussion,distant bs  Cardiovascular: RRR, heart sounds normal, no murmur or gallops, no peripheral edema  Abdomen: soft and NT, no HSM,  BS normal  Musculoskeletal: No deformities, no cyanosis or clubbing  Neuro: alert, non focal  Skin: Warm, no lesions or rashes        Assessment & Plan:   COPD, MILD Mild COPD,  Stable, The pt should be able to under go planned [place] for [facility] No change spiriva      Updated Medication List Outpatient Encounter Prescriptions as of 06/28/2010  Medication Sig Dispense Refill  . amLODipine (NORVASC) 5 MG tablet Take 5 mg by mouth daily.        Marland Kitchen aspirin 81 MG tablet Take 81 mg by mouth daily.        Marland Kitchen atorvastatin (LIPITOR) 20 MG tablet Take 20 mg by mouth daily.        . cloNIDine (CATAPRES) 0.1 MG tablet Take 0.1 mg by mouth 2 (two) times daily.        . metoprolol (TOPROL-XL) 50 MG 24 hr tablet Take 50 mg by mouth 2 (two) times daily.        . Multiple Vitamin (MULTIVITAMIN) capsule Take 1 capsule by mouth daily.        Marland Kitchen omega-3 acid ethyl esters (LOVAZA) 1 G capsule Take 2 g by mouth 2 (two) times daily.       . ranitidine (ZANTAC) 75 MG tablet Take 75 mg by mouth as needed.        . Tamsulosin HCl (FLOMAX) 0.4 MG CAPS Take 0.4 mg by mouth daily.        Marland Kitchen tiotropium (SPIRIVA) 18 MCG inhalation capsule Place 18 mcg into inhaler and inhale daily.        Marland Kitchen warfarin (COUMADIN) 5 MG tablet Take by mouth as directed.       Marland Kitchen DISCONTD: amLODipine (NORVASC) 10 MG tablet Take 10 mg by mouth daily.

## 2010-06-28 NOTE — Patient Instructions (Signed)
No change in medications. Return in       6 months You are cleared for planned surgery

## 2010-06-29 NOTE — Assessment & Plan Note (Signed)
Mild COPD,  Stable, The pt should be able to under go planned [place] for [facility] No change spiriva

## 2010-07-04 ENCOUNTER — Ambulatory Visit (INDEPENDENT_AMBULATORY_CARE_PROVIDER_SITE_OTHER): Payer: BC Managed Care – PPO | Admitting: *Deleted

## 2010-07-04 DIAGNOSIS — I480 Paroxysmal atrial fibrillation: Secondary | ICD-10-CM

## 2010-07-04 DIAGNOSIS — I4891 Unspecified atrial fibrillation: Secondary | ICD-10-CM

## 2010-07-04 LAB — POCT INR: INR: 3.2

## 2010-07-18 ENCOUNTER — Ambulatory Visit (INDEPENDENT_AMBULATORY_CARE_PROVIDER_SITE_OTHER): Payer: BC Managed Care – PPO | Admitting: *Deleted

## 2010-07-18 DIAGNOSIS — I4891 Unspecified atrial fibrillation: Secondary | ICD-10-CM

## 2010-07-18 DIAGNOSIS — I480 Paroxysmal atrial fibrillation: Secondary | ICD-10-CM

## 2010-07-25 NOTE — Assessment & Plan Note (Signed)
[place] OFFICE NOTE   NAME:Eastridge, KEYNAN                    MRN:          [id number]  DATE:02/27/2007                            DOB:          10/21/1945    Mr. Macmaster comes in today for follow-up of his cardiac risk factors  as well as his hypertension.   His blood pressure has been under excellent control, around 120/60-70.  He is out of his exercise routine but denies any exertional chest  discomfort or angina.  He is followed by Dr. Florina Ou and his risk  factors are being appropriately and aggressively managed.   His weight has stayed about the same.  He weighs 236 today, 237 last  year.   MEDICATIONS:  1. Atenolol 100 mg a day.  2. Benazepril 40 mg a day.  3. Norvasc 10 mg a day.  4. Lipitor 20 mg at bedtime.  5. Nexium 40 mg a day.  6. Enteric-coated aspirin 325 a day.  7. Potassium 20 mEq b.i.d.  8. HCTZ 50 mg a day.  9. Clonidine 0.1 b.i.d.  10.Spironolactone 25 mg p.o. b.i.d.  11.Lovaza  two b.i.d.   PHYSICAL EXAMINATION:  Blood pressure is 115/68, pulse 58 and regular.   His EKG shows sinus brady, otherwise normal.  HEENT:  Normocephalic, atraumatic.  PERRLA.  Extraocular movements  intact.  Sclerae clear.  Facial symmetry is normal.  NECK:  Carotid upstrokes are equal bilaterally without bruits.  No JVD.  Thyroid is not enlarged.  Trachea is midline.  LUNGS:  Clear.  HEART:  Reveals soft S1/S2.  PMI is difficult to appreciate.  ABDOMEN:  Abdominal exam is soft.  No anisidine bruit.  No flank bruit.  No hepatomegaly.  EXTREMITIES:  No cyanosis, clubbing, or edema.  Pulses are brisk.  NEUROLOGIC:  Exam is intact.   Mr. Archey is doing well.  I have made no changes in his clinical  program.  We will see him back in a year.  We reviewed again the  symptoms of angina which he report  to Dr. Modena Morrow or I  if he has such.     Donley C. Verl Blalock, MD, [facility]  Electronically  Signed    TCW/MedQ  DD: 02/27/2007  DT: 02/28/2007  Job #: [id number]   cc:   Tammy R. Modena Morrow, M.D.

## 2010-07-25 NOTE — Assessment & Plan Note (Signed)
[place] OFFICE NOTE   NAME:SOMERVILLECarrson, Lightcap                    MRN:          [id number]  DATE:02/10/2008                            DOB:          [birth year]    Mr. Sutphin comes in today for followup of his hypertension and other  risk factors.  His blood pressure has been running extremely well.   His weight is up, and he has not been exercising.  He now weighs 244  from 53.   He does have some dyspnea on exertion, but no chest tightness or  pressure.  He really would like to get back into an exercise program.  It has been 5+ years since he has had a stress test.   He is now followed by Dr. Florina Ou.  She is following his lipids as  well as his other blood work.  I will leave this care in her excellent  hands.   His current meds are:  1. Atenolol 100 mg a day.  2. Benazepril 40 mg a day.  3. Lipitor 20 mg nightly.  4. Nexium 40 mg a day.  5. Enteric-coated aspirin 325 mg a day.  6. Multivitamin.  7. Potassium 20 mEq b.i.d.  8. Hydrochlorothiazide 50 mg a day.  9. Clonidine 0.1 mg b.i.d.  10.Spironolactone 25 mg p.o. b.i.d.  11.Lovaza 2 b.i.d.   PHYSICAL EXAMINATION:  Blood pressure is 120/72, his pulse is 54 and  regular.  His weight is 244.  HEENT is normal.  Carotid upstrokes are  equal bilaterally without bruits.  No JVD.  Thyroid is not enlarged.  Trachea is midline.  Lungs clear to auscultation and percussion.  Heart  reveals a regular rate and rhythm.  No gallop.  Abdominal exam is soft.  Good bowel sounds.  No midline or pulsatile mass.  No bruit.  Extremities reveal no cyanosis, clubbing, or edema.  Pulses are intact.  Neuro exam is intact.   ASSESSMENT AND PLAN:  Mr. Dwan blood pressure is under good  control and I am certain his lipids and other risk factors are in good  control with Dr. Teofilo Pod care.  Before we begin an exercise program, I  have suggested we do an  exercise rest stress Myoview to rule out any  obstructive coronary disease.   He also would like to lower the dose of his atenolol so he can get his  heart rate up higher with exercise.  I think this is quite reasonable.  I have cut him to 50 mg a day.  I suspect he will not have much effect  on his blood pressure.   Assuming his stress Myoview is normal, I will see him back in a year.     Bravlio C. Verl Blalock, MD, [facility]  Electronically Signed    TCW/MedQ  DD: 02/10/2008  DT: 02/11/2008  Job #: [id number]

## 2010-07-28 NOTE — Assessment & Plan Note (Signed)
[place] OFFICE NOTE   NAME:SOMERVILLETrelon, Plush                    MRN:          [id number]  DATE:01/25/2006                            DOB:          [birth year]    Mr. Kujawa returns today for further management of his hypertension.  He  brings in a long list of blood pressure checks today.  All are excellent.  He is running around 884-166 AYTKZSWF/09-32 diastolic.   He has not lost any further weight, but he is working out on a regular  basis.  He looks good, feels good; and is having no symptoms of angina,  ischemia, or exercise intolerance.   His medicines are listed on the maintenance medication list.  He has had  fish oil added per Dr. Modena Morrow.   PHYSICAL EXAMINATION:  His blood pressure today is 110/72.  His pulse is 56  and irregular.  Weight is 237.  He is in no acute distress.  Skin is warm and dry.  PERLA.  Extraocular movements are intact.  Sclerae are clear.  Facial  asymmetry is normal.  Dentition is satisfactory.  Carotid upstrokes are equal bilaterally without bruits.  No JVD.  Thyroid is  not enlarged.  Trachea is midline.  LUNGS:  Clear.  HEART: Reveals a regular rate and rhythm.  ABDOMINAL EXAM:  Soft bowel sounds, no organomegaly and no hepatomegaly.  EXTREMITIES:  Reveal no clubbing, cyanosis, or edema. Pulses are intact.  NEUROLOGIC EXAM:  Intact.  SKIN:  Intact.   EKG:  Shows sinus bradycardia.   ASSESSMENT AND PLAN:  Mr. Branam is doing well.  His blood pressure is  under excellent control.  I think that I have very little to offer at this  point.  I have asked him to see me back on a p.r.n. basis.  He will follow  along with [facility] as indicated.     Avin C. Verl Blalock, MD, [facility]  Electronically Signed    TCW/MedQ  DD: 01/25/2006  DT: 01/25/2006  Job #: [id number]   cc:   Tammy R. Modena Morrow, M.D.

## 2010-08-01 ENCOUNTER — Ambulatory Visit (INDEPENDENT_AMBULATORY_CARE_PROVIDER_SITE_OTHER): Payer: BC Managed Care – PPO | Admitting: *Deleted

## 2010-08-01 DIAGNOSIS — I480 Paroxysmal atrial fibrillation: Secondary | ICD-10-CM

## 2010-08-01 DIAGNOSIS — I4891 Unspecified atrial fibrillation: Secondary | ICD-10-CM

## 2010-08-01 LAB — POCT INR: INR: 3.3

## 2010-08-02 ENCOUNTER — Encounter: Payer: BC Managed Care – PPO | Admitting: *Deleted

## 2010-08-15 ENCOUNTER — Ambulatory Visit (INDEPENDENT_AMBULATORY_CARE_PROVIDER_SITE_OTHER): Payer: BC Managed Care – PPO | Admitting: *Deleted

## 2010-08-15 DIAGNOSIS — I480 Paroxysmal atrial fibrillation: Secondary | ICD-10-CM

## 2010-08-15 DIAGNOSIS — I4891 Unspecified atrial fibrillation: Secondary | ICD-10-CM

## 2010-08-15 LAB — POCT INR: INR: 3

## 2010-08-16 ENCOUNTER — Telehealth: Payer: Self-pay | Admitting: Cardiology

## 2010-08-16 NOTE — Telephone Encounter (Signed)
Faxed LOV, EKG & Echo. To [facility] @ [facility] ([phone]).

## 2010-08-23 ENCOUNTER — Telehealth: Payer: Self-pay | Admitting: Cardiology

## 2010-08-23 NOTE — Telephone Encounter (Signed)
Steven Shepard from Dr Sherwood Gambler Office # [phone] would like to talk to Dr. Verl Blalock re pt when Dr. Verl Blalock comes back to the office.

## 2010-08-24 NOTE — Telephone Encounter (Signed)
Will route to Dr. Verl Blalock.

## 2010-08-30 NOTE — Telephone Encounter (Signed)
Returned call to Dr Rita Ohara. Cleared for surgery or procedures.

## 2010-08-30 NOTE — Telephone Encounter (Signed)
Note faxed to Dr Lollie Sails office

## 2010-09-01 ENCOUNTER — Telehealth: Payer: Self-pay | Admitting: Cardiology

## 2010-09-01 NOTE — Telephone Encounter (Signed)
Faxed OV, EKG & Echo. To [facility] @ [place] ([phone]).

## 2010-09-07 ENCOUNTER — Encounter: Payer: Self-pay | Admitting: Cardiology

## 2010-09-07 ENCOUNTER — Ambulatory Visit (INDEPENDENT_AMBULATORY_CARE_PROVIDER_SITE_OTHER): Payer: BC Managed Care – PPO | Admitting: *Deleted

## 2010-09-07 ENCOUNTER — Ambulatory Visit (INDEPENDENT_AMBULATORY_CARE_PROVIDER_SITE_OTHER): Payer: BC Managed Care – PPO | Admitting: Cardiology

## 2010-09-07 VITALS — BP 120/82 | HR 69 | Ht 73.0 in | Wt 253.4 lb

## 2010-09-07 DIAGNOSIS — I4891 Unspecified atrial fibrillation: Secondary | ICD-10-CM

## 2010-09-07 DIAGNOSIS — Z7901 Long term (current) use of anticoagulants: Secondary | ICD-10-CM

## 2010-09-07 DIAGNOSIS — I10 Essential (primary) hypertension: Secondary | ICD-10-CM

## 2010-09-07 DIAGNOSIS — I480 Paroxysmal atrial fibrillation: Secondary | ICD-10-CM

## 2010-09-07 MED ORDER — AMLODIPINE BESYLATE 5 MG PO TABS: 5.0000 mg | ORAL_TABLET | Freq: Every day | ORAL | Status: DC

## 2010-09-07 NOTE — Progress Notes (Signed)
HPI Steven Shepard returns today for evaluation and management of his hypertension , atrial fibrillation,and also needs preoperative clearance for right hip surgery  July 9.  He is having no symptoms of the irregularity or palpitations. He is in sinus rhythm with a stable EKG today. He remains on Coumadin and will need to stop it at least 5 days before surgery. He is low risk and will not need overlap.  He's had significant swelling in the lower extremities. He is clearly not as mobile but is on maximum dose amlodipine. It  Has been effective in controlling his blood pressure. He is not on a diuretic.  Past Medical History  Diagnosis Date  . DOE (dyspnea on exertion)   . Hyperlipidemia   . Hypertension   . Sleep apnea   . History of neck surgery   . Umbilical hernia     Past Surgical History  Procedure Date  . Tonsillectomy   . Skin surgery     skin cancer  . Neck surgery 2005  . Umbilical hernia repair     Family History  Problem Relation Age of Onset  . Coronary artery disease Mother   . Coronary artery disease Father     History   Social History  . Marital Status: Married    Spouse Name: N/A    Number of Children: N/A  . Years of Education: N/A   Occupational History  . Realtor    Social History Main Topics  . Smoking status: Former Smoker -- 1.5 packs/day for 3 years    Types: Cigarettes, Pipe    Quit date: 03/12/1974  . Smokeless tobacco: Never Used   Comment: quit appox 36 years ago  . Alcohol Use: Yes     occasionally  . Drug Use: No  . Sexually Active: Not on file   Other Topics Concern  . Not on file   Social History Narrative  . No narrative on file    Allergies  Allergen Reactions  . Ace Inhibitors   . Codeine   . Spironolactone     Increases potassium    Current Outpatient Prescriptions  Medication Sig Dispense Refill  . amLODipine (NORVASC) 10 MG tablet Take 10 mg by mouth daily.        Marland Kitchen atorvastatin (LIPITOR) 20 MG tablet Take 20  mg by mouth daily.        . cloNIDine (CATAPRES) 0.1 MG tablet Take 0.1 mg by mouth 2 (two) times daily.        . metoprolol (TOPROL-XL) 50 MG 24 hr tablet Take 50 mg by mouth 2 (two) times daily.       . ranitidine (ZANTAC) 75 MG tablet Take 75 mg by mouth as needed.        . Tamsulosin HCl (FLOMAX) 0.4 MG CAPS Take 0.4 mg by mouth daily.        Marland Kitchen tiotropium (SPIRIVA) 18 MCG inhalation capsule Place 18 mcg into inhaler and inhale daily.        Marland Kitchen aspirin 81 MG tablet Take 81 mg by mouth daily.        . Multiple Vitamin (MULTIVITAMIN) capsule Take 1 capsule by mouth daily.        Marland Kitchen omega-3 acid ethyl esters (LOVAZA) 1 G capsule Take 2 g by mouth 2 (two) times daily.       Marland Kitchen warfarin (COUMADIN) 5 MG tablet Take by mouth as directed.       Marland Kitchen DISCONTD: amLODipine (NORVASC) 5 MG  tablet Take 5 mg by mouth daily.          ROS Negative other than HPI.   PE General Appearance: well developed, well nourished in no acute distress HEENT: symmetrical face, PERRLA, good dentition  Neck: no JVD, thyromegaly, or adenopathy, trachea midline Chest: symmetric without deformity Cardiac: PMI non-displaced, RRR, normal S1, S2, no gallop or murmur Lung: clear to ausculation and percussion Vascular: all pulses full without bruits  Abdominal: nondistended, nontender, good bowel sounds, no HSM, no bruits Extremities: no cyanosis, clubbing, bilateral lower study edema 2+ over 4+ no sign of DVT, no varicosities  Skin: normal color, no rashes Neuro: alert and oriented x 3, non-focal Pysch: normal affect  Filed Vitals:   09/07/10 1044  BP: 120/82  Pulse: 69  Height: 6' 1"  (1.854 m)  Weight: 253 lb 6.4 oz (114.941 kg)    EKG  Labs and Studies Reviewed.   Lab Results  Component Value Date   WBC 9.0 01/05/2010   HGB 13.0 01/05/2010   HCT 38.3* 01/05/2010   MCV 95.0 01/05/2010   PLT 215 01/05/2010      Chemistry      Component Value Date/Time   NA 133* 01/10/2010 0000   K 4.6 01/10/2010 0000   CL  98 01/10/2010 0000   CO2 27 01/10/2010 0000   BUN 39* 01/10/2010 0000   CREATININE 1.5 01/10/2010 0000      Component Value Date/Time   CALCIUM 9.8 01/10/2010 0000       No results found for this basename: CHOL   No results found for this basename: HDL   No results found for this basename: LDLCALC   No results found for this basename: TRIG   No results found for this basename: CHOLHDL   Lab Results  Component Value Date   HGBA1C  Value: 6.0 (NOTE)                                                                       According to the ADA Clinical Practice Recommendations for 2011, when HbA1c is used as a screening test:   >=6.5%   Diagnostic of Diabetes Mellitus           (if abnormal result  is confirmed)  5.7-6.4%   Increased risk of developing Diabetes Mellitus  References:Diagnosis and Classification of Diabetes Mellitus,Diabetes OIBB,0488,89(VQXIH 1):S62-S69 and Standards of Medical Care in         Diabetes - 2011,Diabetes WTUU,8280,03  (Suppl 1):S11-S61.* 01/05/2010   No results found for this basename: ALT, AST, GGT, ALKPHOS, BILITOT   No results found for this basename: TSH

## 2010-09-07 NOTE — Assessment & Plan Note (Signed)
Stable. Currently in normal sinus rhythm. Does not the overlap with Lovenox for surgery. Cleared for surgery at low risk.

## 2010-09-07 NOTE — Assessment & Plan Note (Signed)
Under good control. With his edema will initially increased salt restriction and lower the dose of his amlodipine to 5 mg per day. He'll let us know if he is having poor control of his blood pressure. See back in 3 months

## 2010-09-07 NOTE — Patient Instructions (Signed)
Your physician has recommended you make the following change in your medication: decrease amlodipine (norvasc). You may take 1/2 tablet of your 15m daily  Then start new prescription.  Your physician recommends that you schedule a follow-up appointment in: 3 months with Dr. WVerl Blalock Your physician has requested that you regularly monitor and record your blood pressure readings at home. Please use the same machine at the same time of day to check your readings and record them to bring to your follow-up visit.  A goal blood pressure of 130/80 or less.  Please remember to follow a low salt diet ( sodium).   Call our office if your blood pressure begins to go up and/ or your swelling increases.

## 2010-09-11 ENCOUNTER — Other Ambulatory Visit: Payer: Self-pay | Admitting: Orthopedic Surgery

## 2010-09-11 ENCOUNTER — Encounter (HOSPITAL_COMMUNITY): Payer: BC Managed Care – PPO

## 2010-09-11 ENCOUNTER — Ambulatory Visit (HOSPITAL_COMMUNITY)
Admission: RE | Admit: 2010-09-11 | Discharge: 2010-09-11 | Disposition: A | Discharge status: Home / Self Care | Payer: BC Managed Care – PPO | Source: Ambulatory Visit | Attending: Orthopedic Surgery | Admitting: Orthopedic Surgery

## 2010-09-11 ENCOUNTER — Other Ambulatory Visit (HOSPITAL_COMMUNITY): Payer: Self-pay | Admitting: Orthopedic Surgery

## 2010-09-11 DIAGNOSIS — M169 Osteoarthritis of hip, unspecified: Secondary | ICD-10-CM

## 2010-09-11 DIAGNOSIS — Z01812 Encounter for preprocedural laboratory examination: Secondary | ICD-10-CM | POA: Insufficient documentation

## 2010-09-11 DIAGNOSIS — J449 Chronic obstructive pulmonary disease, unspecified: Secondary | ICD-10-CM | POA: Insufficient documentation

## 2010-09-11 DIAGNOSIS — J4489 Other specified chronic obstructive pulmonary disease: Secondary | ICD-10-CM | POA: Insufficient documentation

## 2010-09-11 DIAGNOSIS — I4891 Unspecified atrial fibrillation: Secondary | ICD-10-CM | POA: Insufficient documentation

## 2010-09-11 DIAGNOSIS — Z01818 Encounter for other preprocedural examination: Secondary | ICD-10-CM | POA: Insufficient documentation

## 2010-09-11 DIAGNOSIS — Z79899 Other long term (current) drug therapy: Secondary | ICD-10-CM | POA: Insufficient documentation

## 2010-09-11 DIAGNOSIS — Z01811 Encounter for preprocedural respiratory examination: Secondary | ICD-10-CM | POA: Insufficient documentation

## 2010-09-11 DIAGNOSIS — Z7982 Long term (current) use of aspirin: Secondary | ICD-10-CM | POA: Insufficient documentation

## 2010-09-11 DIAGNOSIS — Z7901 Long term (current) use of anticoagulants: Secondary | ICD-10-CM | POA: Insufficient documentation

## 2010-09-11 DIAGNOSIS — I10 Essential (primary) hypertension: Secondary | ICD-10-CM | POA: Insufficient documentation

## 2010-09-11 DIAGNOSIS — M25559 Pain in unspecified hip: Secondary | ICD-10-CM | POA: Insufficient documentation

## 2010-09-11 LAB — COMPREHENSIVE METABOLIC PANEL
ALT: 118 U/L — ABNORMAL HIGH (ref 0–53)
AST: 56 U/L — ABNORMAL HIGH (ref 0–37)
Albumin: 3.5 g/dL (ref 3.5–5.2)
Alkaline Phosphatase: 61 U/L (ref 39–117)
BUN: 19 mg/dL (ref 6–23)
CO2: 27 mEq/L (ref 19–32)
Calcium: 9.6 mg/dL (ref 8.4–10.5)
Chloride: 102 mEq/L (ref 96–112)
Creatinine, Ser: 1 mg/dL (ref 0.50–1.35)
GFR calc Af Amer: >60 mL/min (ref >60)
GFR calc non Af Amer: >60 mL/min (ref >60)
Glucose, Bld: 136 mg/dL — ABNORMAL HIGH (ref 70–99)
Potassium: 4 mEq/L (ref 3.5–5.1)
Sodium: 136 mEq/L (ref 135–145)
Total Bilirubin: 1.1 mg/dL (ref 0.3–1.2)
Total Protein: 6.9 g/dL (ref 6.0–8.3)

## 2010-09-11 LAB — APTT: aPTT: 53 seconds — ABNORMAL HIGH (ref 24–37)

## 2010-09-11 LAB — CBC
Hemoglobin: 13.7 g/dL (ref 13.0–17.0)
MCH: 38.6 pg — ABNORMAL HIGH (ref 26.0–34.0)
MCHC: 39.7 g/dL — ABNORMAL HIGH (ref 30.0–36.0)
RDW: 18.7 % — ABNORMAL HIGH (ref 11.5–15.5)

## 2010-09-11 LAB — PROTIME-INR
INR: 2.81 — ABNORMAL HIGH (ref 0.00–1.49)
Prothrombin Time: 30 seconds — ABNORMAL HIGH (ref 11.6–15.2)

## 2010-09-11 LAB — SURGICAL PCR SCREEN
MRSA, PCR: NEGATIVE
Staphylococcus aureus: NEGATIVE

## 2010-09-11 LAB — URINALYSIS, ROUTINE W REFLEX MICROSCOPIC
Bilirubin Urine: NEGATIVE
Glucose, UA: NEGATIVE mg/dL
Hgb urine dipstick: NEGATIVE
Ketones, ur: NEGATIVE mg/dL
Protein, ur: NEGATIVE mg/dL
pH: 6 (ref 5.0–8.0)

## 2010-09-18 ENCOUNTER — Inpatient Hospital Stay (HOSPITAL_COMMUNITY)
Admission: RE | Admit: 2010-09-18 | Discharge: 2010-09-21 | DRG: 818 | Disposition: A | Discharge status: Home Health | Payer: BC Managed Care – PPO | Source: Ambulatory Visit | Attending: Orthopedic Surgery | Admitting: Orthopedic Surgery

## 2010-09-18 ENCOUNTER — Inpatient Hospital Stay (HOSPITAL_COMMUNITY): Payer: BC Managed Care – PPO

## 2010-09-18 DIAGNOSIS — J449 Chronic obstructive pulmonary disease, unspecified: Secondary | ICD-10-CM | POA: Diagnosis present

## 2010-09-18 DIAGNOSIS — M1610 Unilateral primary osteoarthritis, unspecified hip: Principal | ICD-10-CM | POA: Diagnosis present

## 2010-09-18 DIAGNOSIS — M169 Osteoarthritis of hip, unspecified: Principal | ICD-10-CM | POA: Diagnosis present

## 2010-09-18 DIAGNOSIS — H547 Unspecified visual loss: Secondary | ICD-10-CM | POA: Diagnosis present

## 2010-09-18 DIAGNOSIS — J4489 Other specified chronic obstructive pulmonary disease: Secondary | ICD-10-CM | POA: Diagnosis present

## 2010-09-18 DIAGNOSIS — I4891 Unspecified atrial fibrillation: Secondary | ICD-10-CM | POA: Diagnosis present

## 2010-09-18 DIAGNOSIS — I10 Essential (primary) hypertension: Secondary | ICD-10-CM | POA: Diagnosis present

## 2010-09-18 HISTORY — PX: TOTAL HIP ARTHROPLASTY: SHX124

## 2010-09-18 LAB — APTT: aPTT: 31 seconds (ref 24–37)

## 2010-09-18 LAB — ABO/RH: ABO/RH(D): O POS

## 2010-09-19 DIAGNOSIS — J449 Chronic obstructive pulmonary disease, unspecified: Secondary | ICD-10-CM

## 2010-09-19 LAB — BASIC METABOLIC PANEL
Calcium: 8.4 mg/dL (ref 8.4–10.5)
Chloride: 99 mEq/L (ref 96–112)
Creatinine, Ser: 0.87 mg/dL (ref 0.50–1.35)
GFR calc Af Amer: >60 mL/min (ref >60)
Sodium: 132 mEq/L — ABNORMAL LOW (ref 135–145)

## 2010-09-19 LAB — CBC
MCH: 35 pg — ABNORMAL HIGH (ref 26.0–34.0)
MCHC: 36.5 g/dL — ABNORMAL HIGH (ref 30.0–36.0)
Platelets: 158 10*3/uL (ref 150–400)

## 2010-09-19 LAB — TYPE AND SCREEN
ABO/RH(D): O POS
DAT, IgG: NEGATIVE

## 2010-09-19 LAB — PROTIME-INR: Prothrombin Time: 15.7 seconds — ABNORMAL HIGH (ref 11.6–15.2)

## 2010-09-20 LAB — BASIC METABOLIC PANEL
BUN: 19 mg/dL (ref 6–23)
Calcium: 8.3 mg/dL — ABNORMAL LOW (ref 8.4–10.5)
Creatinine, Ser: 0.86 mg/dL (ref 0.50–1.35)
GFR calc Af Amer: >60 mL/min (ref >60)
GFR calc non Af Amer: >60 mL/min (ref >60)

## 2010-09-20 LAB — CBC
MCHC: 33.9 g/dL (ref 30.0–36.0)
Platelets: 155 10*3/uL (ref 150–400)
RDW: 14.7 % (ref 11.5–15.5)

## 2010-09-20 LAB — PROTIME-INR: Prothrombin Time: 20.9 seconds — ABNORMAL HIGH (ref 11.6–15.2)

## 2010-09-20 NOTE — H&P (Signed)
NAME:  DANISH, RUFFINS NO.:  [id number]  MEDICAL RECORD NO.:  [id number]  LOCATION:                                 FACILITY:  PHYSICIAN:  Gaynelle Arabian, M.D.    DATE OF BIRTH:  1945-11-29  DATE OF ADMISSION:  09/18/2010 DATE OF DISCHARGE:                             HISTORY & PHYSICAL   CHIEF COMPLAINT:  Right hip pain.  BRIEF HISTORY:  Mr. Benney came in to see Dr. Wynelle Link back in March regarding his right hip pain.  He states this problem started back in October of last year.  He had a lot of back trouble and spinal stenosis. Dr. Sherwood Gambler had been following him since that.  Back in October, however, he started to experience some right groin and anterior thigh pain, difficulty putting on his socks.  He states he is at a point now where the hip pain is bothering him to do his daily activities and he now presents for a right total hip arthroplasty.  MEDICATION ALLERGIES:  CODEINE causes hallucinations and ACE INHIBITORS.  PRIMARY CARE PHYSICIAN:  Tammy R. Modena Morrow, M.D.  CARDIOLOGIST:  Marijo Conception. Verl Blalock, MD, [facility]  PULMONOLOGIST:  Burnett Harry. Joya Gaskins, MD, FCCP.  NEUROLOGIST:  Hosie Spangle, M.D.  GASTROENTEROLOGY:  Joyice Faster. Oletta Lamas, M.D.  CURRENT MEDICATIONS: 1. Amlodipine. 2. Aspirin which she will discontinue. 3. Atorvastatin. 4. Clonidine. 5. Metoprolol. 6. Multivitamin. 7. Omega-3 which she will discontinue. 8. Ranitidine. 9. Tamsulosin. 10.Spiriva. 11.Coumadin which we will await Dr. Winnifred Friar recommendation regarding     but I told the patient most likely he will discontinue it 5 days     prior to surgery.  PAST MEDICAL HISTORY: 1. End-stage arthritis of the right hip. 2. Impaired vision, he wears glasses. 3. COPD. 4. AFib. 5. Hypertension. 6. Arthritis.  PAST SURGICAL HISTORY: 1. C-spine surgery in 2005. 2. Umbilical hernia repair. 3. Tonsillectomy and adenoidectomy.  He has done fine with anesthesia in the past.  FAMILY  HISTORY:  Father passed at the age of 23, he had heart disease. Mother passed at age of 26, she also had heart disease.  SOCIAL HISTORY:  The patient is married.  He works as a Forensic psychologist.  He admits past use of tobacco products.  He quit about 34 years ago.  He also does not consume alcohol as this was triggering his atrial fibrillation.  He lives at home with his wife.  He does plan to go home following his hospital stay.  REVIEW OF SYSTEMS:  GENERAL:  Negative for fevers or weight change. HEENT/NEURO:  Negative for headache or blurred vision.  DERMATOLOGIC: Negative for rash.  RESPIRATORY:  Negative for shortness of breath. CARDIOVASCULAR:  Negative for palpitations.  Last EKG was 1 month ago. GI:  Negative for nausea or vomiting.  GU:  Positive for urinating at nighttime.  Negative for hematuria, dysuria.  MUSCULOSKELETAL:  Positive for joint pain, muscular pain, back pain.  PHYSICAL EXAMINATION:  VITAL SIGNS:  Pulse 72, respirations 18, blood pressure 120/70 in the left arm. GENERAL:  Mr. Antonelli is alert and oriented x3, well developed, well nourished, in no apparent distress.  He is a pleasant 65 year old male who has  stated height of 6 feet 1 inch and stated weight of 252 pounds. HEENT:  Normocephalic, atraumatic.  Extraocular movements intact.  The patient wears glasses. NECK:  Supple.  Full range of motion without lymphadenopathy. CHEST:  Lungs are clear to auscultation bilaterally without wheezes. HEART:  Regular rate and rhythm without murmur. ABDOMEN:  Bowel sounds present in all 4 quadrants.  Abdomen is nontender to palpation. EXTREMITIES:  Right hip flexion is 90 degrees.  No internal rotation, about 10 degrees of external rotation, 10 to 20 degrees of abduction with discomfort. SKIN:  Unremarkable. NEUROLOGIC:  Intact.  Peripheral vascular, carotid pulses 2+ bilaterally without bruit.  RADIOGRAPHS:  AP and lateral views of the right hip reveal  bone-on-bone changes with large osteophyte formation.  IMPRESSION:  End-stage arthritis of the right hip.  PLAN:  Right total hip arthroplasty to be performed by Dr. Wynelle Link.  At the time of dictation, we are pending clearance from Dr. Verl Blalock.     Toniann Fail, PAC   ______________________________ Gaynelle Arabian, M.D.    LD/MEDQ  D:  09/06/2010  T:  09/06/2010  Job:  [id number]  Electronically Signed by Gaynelle Arabian M.D. on 09/20/2010 12:33:56 PM

## 2010-09-20 NOTE — Op Note (Signed)
NAME:  [name], [name] NO.:  [id number]  MEDICAL RECORD NO.:  [id number]  LOCATION:  0006                         FACILITY:  [facility]  PHYSICIAN:  Gaynelle Arabian, M.D.    DATE OF BIRTH:  1945-11-26  DATE OF PROCEDURE:  09/18/2010 DATE OF DISCHARGE:                              OPERATIVE REPORT   PREOPERATIVE DIAGNOSIS:  Osteoarthritis right hip.  POSTOPERATIVE DIAGNOSIS:  Osteoarthritis right hip.  PROCEDURE:  Right total hip arthroplasty.  SURGEON:  Gaynelle Arabian, M.D.  ASSISTANT:  Arlee Muslim, PA-C  ANESTHESIA:  General.  ESTIMATED BLOOD LOSS:  600 mL.  DRAIN:  Hemovac x1.  COMPLICATIONS:  None.  CONDITION:  Stable to recovery.  BRIEF CLINICAL NOTE:  [name] is a 65 year old male with advanced end-stage arthritis of the right hip with progressively worsening pain and dysfunction.  He has failed nonoperative management and presents for total hip arthroplasty.  PROCEDURE IN DETAIL:  After successful administration of general anesthetic, the patient was placed in left lateral decubitus position with the right side up and held with the hip positioner.  Right lower extremity was isolated from his perineum with plastic drapes and prepped and draped in usual sterile fashion.  Short posterolateral incision was made with a 10 blade through subcutaneous tissue to the level of the fascia lata, which was incised in line with the skin incision.  The sciatic nerve was palpated and protected, and short rotators and capsule were isolated off the femur.  The hip was dislocated and the center of femoral head was marked.  A trial prosthesis was placed such that the center of the trial head corresponds to the center of his native femoral head.  Osteotomy line was marked on the femoral neck and osteotomy with an oscillating saw.  The femoral head was then removed.  Femoral retractors were placed around the proximal femur to gain access to the canal.  Starter  reamer was passed into the femoral canal, which was then thoroughly irrigated with saline to remove fatty contents. Axial reaming was performed up to 15.5 mm, then proximal reaming to a 20 F and the sleeve machined to extra-extra large.  A 20 F extra-extra large trial sleeve was then placed.  The femur was retracted anteriorly to gain acetabular exposure. Acetabular retractors were placed, labrum and osteophytes removed. Reaming was performed up to 55 mm for placement of 56 mm pinnacle acetabular shell.  This was impacted in anatomic position with excellent purchase.  Two additional dome screws were placed with excellent purchase.  Apex hole eliminator was placed and a 36-mm neutral +4 marathon liner was placed.  The trial stem was a 20 x 15 with a 36 +12 neck about 10 degrees beyond native anteversion.  The 36 +0 trial head was placed.  We reduced this fairly easily, but with a +3 we had appropriate soft-tissue tension.  Stability was great with full extension, full external rotation, 70 degrees flexion, 4 degrees adduction, 90 degrees internal rotation and 90 degrees of flexion and 70 degrees of internal rotation.  By placing the right leg on top of the left, it feels that leg lengths were equal.  The hips then dislocated and trials are removed.  The permanent 20 F extra-extra large sleeve was placed in the 20 x 15 stem with the 36 +12 neck placed and 10 degrees anteversion further than native.  The 36 +3 ceramic head was placed. The hips then reduced with the same stability parameters.  The wound was copiously irrigated with saline solution then the short rotators and capsule reattached to the femur through drill holes with Ethibond suture.  Fascia lata was closed over Hemovac drain with interrupted #1 Vicryl, subcu closed with #1, then #2-0 Vicryl and subcuticular closed with running 4-0 Monocryl.  Catheter for Marcaine pain pump was placed and pumps initiated.  The incisions cleaned  and dried and a sterile dressing applied.  He was then placed into a knee immobilizer, awakened and transported to Recovery in stable condition.     Gaynelle Arabian, M.D.     FA/MEDQ  D:  09/18/2010  T:  09/18/2010  Job:  [id number]  Electronically Signed by Gaynelle Arabian M.D. on 09/20/2010 12:33:59 PM

## 2010-09-21 LAB — CBC
HCT: 26 % — ABNORMAL LOW (ref 39.0–52.0)
MCHC: 33.1 g/dL (ref 30.0–36.0)
RDW: 15.2 % (ref 11.5–15.5)

## 2010-09-21 LAB — PROTIME-INR
INR: 1.57 — ABNORMAL HIGH (ref 0.00–1.49)
Prothrombin Time: 19.1 seconds — ABNORMAL HIGH (ref 11.6–15.2)

## 2010-09-22 ENCOUNTER — Ambulatory Visit (INDEPENDENT_AMBULATORY_CARE_PROVIDER_SITE_OTHER): Payer: Self-pay | Admitting: Cardiology

## 2010-09-22 DIAGNOSIS — R0989 Other specified symptoms and signs involving the circulatory and respiratory systems: Secondary | ICD-10-CM

## 2010-09-22 LAB — POCT INR: INR: 1.8

## 2010-09-25 NOTE — Discharge Summary (Signed)
NAME:  Steven Shepard, Steven Shepard NO.:  [id number]  MEDICAL RECORD NO.:  [id number]  LOCATION:  3220                         FACILITY:  [facility]  PHYSICIAN:  Gaynelle Arabian, M.D.    DATE OF BIRTH:  1945/08/07  DATE OF ADMISSION:  09/18/2010 DATE OF DISCHARGE:  09/21/2010                              DISCHARGE SUMMARY   ADMITTING DIAGNOSES: 1. Osteoarthritis right hip. 2. Impaired vision 3. Chronic obstructive pulmonary disease. 4. History of atrial fibrillation. 5. Hypertension 6. Osteoarthritis.  DISCHARGE DIAGNOSES: 1. Osteoarthritis, right hip, status post right total hip replacement     arthroplasty. 2. Postop acute blood loss anemia did not require transfusion. 3. Postop hyponatremia, improved. 4. Impaired vision 5. Chronic obstructive pulmonary disease. 6. History of atrial fibrillation. 7. Hypertension 8. Osteoarthritis.  PROCEDURE:  September 18, 2010, right total hip.  Surgeon, Dr. Wynelle Link. Assistant, Greenland Perkins, P.A.C.  Anesthesia general.  CONSULTS:  Pulmonary, Dr. Asencion Noble.  BRIEF HISTORY:  The patient is a 65 year old male well known to Dr. Wynelle Link with known end-stage arthritis of the right hip.  He has failed nonoperative management, now presents for total hip arthroplasty.  LABORATORY DATA:  Preop CBC showed hemoglobin 13.7 with hematocrit 34.5, white cell count 7.4, platelets 193.  PT/INR preop 30 and 2.81 with PTT of 53, on chronic Coumadin reading.  INR rechecked on the day of surgery was normal at 1.22.  Chem panel on admission slightly elevated glucose of 136, elevated AST of 56, elevated ALT of 118, otherwise, Chem panel within normal limits.  Preop UA negative.  Blood group type O positive. Nasal swabs were negative for staph aureus, negative for MRSA.  Serial CBCs were followed.  Hemoglobin dropped down to 11.3, then 10.8.  Last noted H and H of 8.6 and 26.0.  White count went up from 7.4 to 15.7, back down to 13.1.  Started  back on Coumadin.  Serial protimes were followed.  Last noted PT/INR 19.1 and 1.57.  Please note the patient did have a Lovenox bridge postop.  Serial BMETs were followed for 48 hours. Sodium did drop from 136 to 132, back up to 137.  Remaining BMETs within normal limits.  HOSPITAL COURSE:  The patient admitted to [facility], taken to OR, underwent above-stated procedure without complication.  The patient tolerated the procedure well, later transferred to recovery room on orthopedic floor, started on p.o. and IV analgesic pain control following surgery, was started back on his home meds.  Did have a history of COPD and we encouraged pulmonary toilet.  Started back on his Spiriva.  He was seen postop Dr. Asencion Noble and was doing satisfactory stable after his surgery.  Dr. Joya Gaskins was available for questions and did see the patient on postop day #1.  Started getting up out of bed, partial weightbearing by day 2.  The patient was doing very well.  Hemoglobin was 10.8 and electrolytes looked good.  We changed the dressing.  Incision looked good.  Continued to progress with therapy and walking over 150 feet on day 2.  By day 3, he was seen in rounds by Dr. Wynelle Link, no complaints.  Hemoglobin was down to 8.6 but he is  asymptomatic with this so we just put him on some iron supplementation, otherwise, everything else looked good.  Incision was healing well, was discharged home after therapy.  DISCHARGE/PLAN: 1. The patient discharged home on September 21, 2010. 2. Discharge diagnoses, please see above. 3. Discharge meds:  Dilaudid, Robaxin, Coumadin.  Continue home meds     of amlodipine, baby aspirin, clonidine, Flomax, Lipitor and     eyedrops, also Spiriva and Zantac.  DIET:  Heart-healthy diet.  ACTIVITIES:  Partial weightbearing, 25-50% to the right lower extremity. Hip precautions, total hip protocol.  Follow-up in 2 weeks.  DISPOSITION:  Home.  CONDITION ON DISCHARGE:   Improved.     Alexzandrew L. Dara Lords, P.A.C.   ______________________________ Gaynelle Arabian, M.D.    ALP/MEDQ  D:  09/21/2010  T:  09/21/2010  Job:  [id number]  cc:   Gaynelle Arabian, M.D. Fax: [phone]  Tammy R. Modena Morrow, M.D. Fax: [place]. [place], [place], [place] [address]  [address] [place] [address]  Patrick E. Joya Gaskins, MD, FCCP [address] [place] [address]  Hosie Spangle, M.D. Fax: Chireno Rolla Flatten., M.D. Fax: [phone]  Electronically Signed by Mickel Crow P.A.C. on 09/21/2010 10:42:55 AM Electronically Signed by Gaynelle Arabian M.D. on 09/25/2010 06:51:37 AM

## 2010-09-28 ENCOUNTER — Ambulatory Visit (INDEPENDENT_AMBULATORY_CARE_PROVIDER_SITE_OTHER): Payer: Self-pay | Admitting: Cardiovascular Disease

## 2010-09-28 DIAGNOSIS — R0989 Other specified symptoms and signs involving the circulatory and respiratory systems: Secondary | ICD-10-CM

## 2010-09-28 LAB — POCT INR: INR: 2.6

## 2010-10-04 ENCOUNTER — Telehealth: Payer: Self-pay | Admitting: Critical Care Medicine

## 2010-10-04 DIAGNOSIS — G4730 Sleep apnea, unspecified: Secondary | ICD-10-CM

## 2010-10-04 NOTE — Telephone Encounter (Signed)
Ok to make referral for sleep study

## 2010-10-04 NOTE — Telephone Encounter (Signed)
Order was sent to [facility]. Pt aware.  

## 2010-10-04 NOTE — Telephone Encounter (Signed)
Called and spoke with pt.  Pt is wanting to know if PW will order sleep study for him.  States he was dx with "borderline" osa approx 5 to 6 when he had a sleep study done at [facility] and was never started on cpap.  Pt states his wife tells him he stops breathing when sleeping.  Therefore pt would like to have another sleep study done to see if the results have worsened from the study he had done previously.  Pw, please advise.  Thanks.

## 2010-10-10 ENCOUNTER — Ambulatory Visit (HOSPITAL_BASED_OUTPATIENT_CLINIC_OR_DEPARTMENT_OTHER): Payer: BC Managed Care – PPO | Attending: Critical Care Medicine

## 2010-10-10 DIAGNOSIS — J4489 Other specified chronic obstructive pulmonary disease: Secondary | ICD-10-CM | POA: Insufficient documentation

## 2010-10-10 DIAGNOSIS — I10 Essential (primary) hypertension: Secondary | ICD-10-CM | POA: Insufficient documentation

## 2010-10-10 DIAGNOSIS — G4730 Sleep apnea, unspecified: Secondary | ICD-10-CM

## 2010-10-10 DIAGNOSIS — G4733 Obstructive sleep apnea (adult) (pediatric): Secondary | ICD-10-CM | POA: Insufficient documentation

## 2010-10-10 DIAGNOSIS — J449 Chronic obstructive pulmonary disease, unspecified: Secondary | ICD-10-CM | POA: Insufficient documentation

## 2010-10-12 ENCOUNTER — Ambulatory Visit (INDEPENDENT_AMBULATORY_CARE_PROVIDER_SITE_OTHER): Payer: BC Managed Care – PPO | Admitting: *Deleted

## 2010-10-12 DIAGNOSIS — I480 Paroxysmal atrial fibrillation: Secondary | ICD-10-CM

## 2010-10-12 DIAGNOSIS — I4891 Unspecified atrial fibrillation: Secondary | ICD-10-CM

## 2010-10-17 ENCOUNTER — Telehealth: Payer: Self-pay | Admitting: Critical Care Medicine

## 2010-10-17 NOTE — Telephone Encounter (Signed)
Spoke with Steven Shepard over at sleep center and she states the sleep study is in Dr. Bari Mantis box for him to read. Pt is aware we will call him as soon as it has been read. Please advise Dr. Elsworth Soho. Thanks  Charma Igo, [place]

## 2010-10-17 NOTE — Telephone Encounter (Signed)
Study not read yet,  Call sleep center and ask them to get it read and sent to me

## 2010-10-17 NOTE — Telephone Encounter (Signed)
lmomtcb x1 for danean

## 2010-10-17 NOTE — Telephone Encounter (Signed)
Dr Joya Gaskins pls advise if you have reviewed these results.

## 2010-10-19 ENCOUNTER — Telehealth: Payer: Self-pay | Admitting: Critical Care Medicine

## 2010-10-19 ENCOUNTER — Telehealth: Payer: Self-pay | Admitting: Cardiology

## 2010-10-19 DIAGNOSIS — I10 Essential (primary) hypertension: Secondary | ICD-10-CM

## 2010-10-19 DIAGNOSIS — G4733 Obstructive sleep apnea (adult) (pediatric): Secondary | ICD-10-CM

## 2010-10-19 DIAGNOSIS — J449 Chronic obstructive pulmonary disease, unspecified: Secondary | ICD-10-CM

## 2010-10-19 NOTE — Telephone Encounter (Signed)
Pl let him know - PSG showed severe obstructive sleep apnea corrected by CPAP. Pat, can you pl send order to start him on - CPAP 9cm, small nasal comfort gel mask, heated humidity, C-flex setting of 2 cm Download in 1 month I will ask JR to schedule routine OV with me for sleep within the next month.

## 2010-10-19 NOTE — Telephone Encounter (Signed)
Pt was taken off klondine when he had surgery and he wants to know if he needs to go back on it

## 2010-10-19 NOTE — Telephone Encounter (Signed)
See telephone encounter.

## 2010-10-19 NOTE — Telephone Encounter (Signed)
Call the pt and tell him sleep study shows severe sleep apnea See order for cpap and pt to f/u with RA for sleep eval

## 2010-10-19 NOTE — Telephone Encounter (Signed)
Pt calls today about resuming his clonidine post hip surgery. BP as follows:  Today  153/93                           10/18/10  142/93   184/103  Pt will resume clonidine as prior to surgery.  His discharge summary had stated for him to continue his clonidine but pt said " my blood pressure ran less than 150 and they told me to not take it unless it was over 237"  Diastolic remains 62'G-315'V Prior to surgery last ov with Dr. Verl Blalock Bp was 120/82 Horton Chin RN

## 2010-10-19 NOTE — Telephone Encounter (Signed)
Called, spoke with pt.  He is aware sleep study shows sever sleep apnea per PW.  He is aware order for cpap has been sent to [facility] and they will be contacting him about setting this up.  He is also aware PW wants him to f/u with RA for sleep eval.  This was schedule for Sept 6, 2012 at 4pm (this was RA's first available as he was out of the office the end of Aug).  Pt aware to arrive at 3:45.

## 2010-10-19 NOTE — Procedures (Signed)
NAME:  Steven Shepard, Steven Shepard NO.:  [id number]  MEDICAL RECORD NO.:  [id number]          PATIENT TYPE:  OUT  LOCATION:  SLEEP CENTER                 FACILITY:  [facility]  PHYSICIAN:  Rigoberto Noel, MD      DATE OF BIRTH:  11/23/1945  DATE OF STUDY:  10/10/2010                           NOCTURNAL POLYSOMNOGRAM  REFERRING PHYSICIAN:  Burnett Harry. Joya Gaskins, MD, [facility]  REFERRING PHYSICIAN:  Burnett Harry. Joya Gaskins, MD, FCCP  INDICATION FOR STUDY:  Mr. Isenberg is a 65 year old gentleman with COPD and hypertension, reported excessive daytime fatigue, witnessed apneas.  He also reported that the study 10 years ago had shown "borderline sleep apnea," but has not started CPAP.  EPWORTH SLEEPINESS SCORE:  At the time of the study, he weighed 245 pounds with a height of 6 feet and 1 inch, BMI of 32, neck size of 17 inches, Epworth sleepiness score of 7.  MEDICATIONS:  Amlodipine, aspirin, Spiriva, clonidine, methocarbamol, and tramadol.  At bedtime, he uses Lipitor, Flomax, metoprolol, clonidine, and warfarin.  This intervention polysomnogram was performed with a sleep technologist in attendance.  EEG, EOG, EMG, EKG, and respiratory parameters were recorded.  Sleep stages arousals, limb movements, and respiratory data were scored according to criteria laid out by the American Academy of Sleep Medicine.  SLEEP ARCHITECTURE:  Lights out was at 10:30 p.m. and lights on was at 5:02 a.m.  Total sleep time was 132 minutes with a sleep period time of 160 minutes and a sleep efficiency of 73%.  Sleep latency was 16 minutes and latency to REM sleep was 123 minutes.  Sleep stages of the percentage of total sleep time was N1 27%, N2 63%, N3 0%, REM sleep 10% (13 minutes).  Supine sleep was noted throughout.  During the titration portion, supine REM sleep accounted for 26 minutes. REM sleep was scattered around 3 a.m. and 4 a.m.  RESPIRATORY DATA:  During the diagnostic portion, there were  160 obstructive apneas, 5 central apneas, and 1 mixed apnea and 24 hypopneas with an apnea/hypopnea index of 86 events per hour at a lowest desaturation of 66%.  At this degree of respiratory disturbance, CPAP was initiated at 4 cm with nasal mask and titrated to a final level of 9 cm at this level for 97 minutes including 22 minutes of REM supine sleep, 1 obstructive apnea, 6 central apneas, and 0 mixed apnea were noted and 14 hypopneas were noted with an AHI of 13 events per hour and the low desaturation of 87%.  This appears to be the optimal level used during the study.  AROUSAL DATA:  During the diagnostic portion, there were 180 arousals with an arousal index of 81 events per hour of these, 35 were spontaneous and the rests were associated with respiratory events.  He had much fewer arousals with an index of 13 events per hour during titration portion.  OXYGEN DATA:  Oxygen saturation data:  Lowest desaturation during the diagnostic portion was 66% with the desaturation index of 74 events per hour and CPAP was 9 cm.  The lowest desaturation was 87%.  He spent 3 minutes with a saturation less than 88% during the titration portion.  CARDIAC  DATA:  The low heart rate was 35 beats per minute.  The high heart rate recorded was an artifact.  No arrhythmias were noted.  MOVEMENT-PARASOMNIA:  Limb movement data:  No significant limb movements were noted.  DISCUSSION:  Severe desaturations during REM sleep.  He was desensitized with a small Respironics ComfortGel nasal mask.  Heated humidity and C- Flex of 3 cm were all added for patient's comfort.  He appeared to be better rested in the morning after the study.  IMPRESSIONS-RECOMMENDATIONS:  Impression: 1. Severe obstructive sleep apnea with hypopneas causing sleep     fragmentation and oxygen desaturation and today this was corrected     by CPAP of 9 cm with a small nasal mask. 2. No evidence of cardiac arrhythmias, limb  movements, or behavioral     disturbance during sleep.  Recommendations: 1. CPAP should be initiated at 9 cm with a small nasal mask.  Heated     humidity and C-Flex of 3 cm can be added for patient comfort and     compliance can be monitored at this level. 2. He should be advised against or decreasing doses of medications     with sedative side effects such as methocarbamol. 3. He should be cautioned against driving when sleepy.     Rigoberto Noel, MD Electronically Signed    RVA/MEDQ  D:  10/19/2010 08:37:29  T:  10/19/2010 09:18:31  Job:  [id number]

## 2010-10-23 NOTE — Telephone Encounter (Signed)
JR, Please confirm this message has been taken care of. I do see appt with RA made for 11-16-10 at 4pm. Thanks.

## 2010-10-24 NOTE — Telephone Encounter (Signed)
Pt is aware of above and has started CPAP on Friday, no complaints at this time.

## 2010-11-09 ENCOUNTER — Ambulatory Visit (INDEPENDENT_AMBULATORY_CARE_PROVIDER_SITE_OTHER): Payer: BC Managed Care – PPO | Admitting: *Deleted

## 2010-11-09 DIAGNOSIS — I480 Paroxysmal atrial fibrillation: Secondary | ICD-10-CM

## 2010-11-09 DIAGNOSIS — I4891 Unspecified atrial fibrillation: Secondary | ICD-10-CM

## 2010-11-16 ENCOUNTER — Ambulatory Visit (INDEPENDENT_AMBULATORY_CARE_PROVIDER_SITE_OTHER): Payer: BC Managed Care – PPO | Admitting: Pulmonary Disease

## 2010-11-16 ENCOUNTER — Encounter: Payer: Self-pay | Admitting: Pulmonary Disease

## 2010-11-16 DIAGNOSIS — G4730 Sleep apnea, unspecified: Secondary | ICD-10-CM

## 2010-11-16 NOTE — Progress Notes (Signed)
  Subjective:    Patient ID: Steven Shepard, male    DOB: 1945/06/28, 65 y.o.   MRN: [id number]  HPI PCP - Charleston Poot  64/M, ex smoker, realtor with midl COPD referred for management of obstructive sleep apnea . He was dx with "borderline" osa approx 5 to 6 when he had a sleep study done at [facility] and was never started on cpap. Pt states his wife tells him he stops breathing when sleeping.  ESS 8. Bedtime 10-11p, bedtime meds include clonidine, lipitor & metoprolol. Latency 10-15 mins, 1-2 awakenings , nocturia +, sleeps on his side with 1 pillow, loud snoring & apneas witnessed by wife. He was hospitalised for hip replacement & needed oxygen during sleep. He has gained 30 lbs over the last 2 yrs. PSG showed severe obstructive sleep apnea with AHI 86/h  corrected by CPAP 9 cm Started on CPAP 9cm, small nasal comfort gel mask, heated humidity, C-flex setting of 2 cm - He is tolertaing this well, feels more refreshed, no leak, mask ok, pressure ok.   Review of Systems  Constitutional: Positive for unexpected weight change. Negative for fever and appetite change.  HENT: Negative for ear pain, congestion, sore throat, rhinorrhea, sneezing, trouble swallowing, dental problem and postnasal drip.   Eyes: Negative for redness.  Respiratory: Positive for shortness of breath. Negative for cough and wheezing.   Cardiovascular: Positive for leg swelling. Negative for chest pain and palpitations.  Gastrointestinal: Negative for nausea, vomiting, abdominal pain and diarrhea.  Genitourinary: Negative for dysuria and urgency.  Musculoskeletal: Negative for joint swelling.  Skin: Negative for rash.  Neurological: Negative for syncope and headaches.  Hematological: Does not bruise/bleed easily.  Psychiatric/Behavioral: Negative for dysphoric mood. The patient is not nervous/anxious.        Objective:   Physical Exam  Gen. Pleasant, well-nourished, in no distress, normal affect ENT - no lesions, no  post nasal drip, class 2 airway Neck: No JVD, no thyromegaly, no carotid bruits Lungs: no use of accessory muscles, no dullness to percussion, clear without rales or rhonchi  Cardiovascular: Rhythm regular, heart sounds  normal, no murmurs or gallops, no peripheral edema Abdomen: soft and non-tender, no hepatosplenomegaly, BS normal. Musculoskeletal: No deformities, no cyanosis or clubbing Neuro:  alert, non focal       Assessment & Plan:

## 2010-11-16 NOTE — Patient Instructions (Addendum)
You are adjusting well to your machine You are set at 9 cm We will give you feedback once we look at the download

## 2010-11-16 NOTE — Assessment & Plan Note (Signed)
The pathophysiology of obstructive sleep apnea , it's cardiovascular consequences & modes of treatment including CPAP were discused with the patient in detail & they evidenced understanding. PSG showed severe obstructive sleep apnea with AHI 86/h  corrected by CPAP 9 cm & he is having good results with cpap. Hopefully he will have benefits for BP  & A fibn too. Weight loss encouraged, compliance with goal of at least 4-6 hrs every night is the expectation. Advised against medications with sedative side effects Cautioned against driving when sleepy - understanding that sleepiness will vary on a day to day basis

## 2010-11-29 ENCOUNTER — Telehealth: Payer: Self-pay | Admitting: Critical Care Medicine

## 2010-11-29 DIAGNOSIS — G4730 Sleep apnea, unspecified: Secondary | ICD-10-CM

## 2010-11-29 DIAGNOSIS — G4733 Obstructive sleep apnea (adult) (pediatric): Secondary | ICD-10-CM

## 2010-11-29 NOTE — Telephone Encounter (Signed)
Called, spoke with pt.  He was informed of below results and recs per PW.  He verbalized understanding of this and is aware order will be sent to DME for this.  He voiced no further questions/concerns at this time.

## 2010-11-29 NOTE — Telephone Encounter (Signed)
Call pt and tell him I received his cpap compliance report.  He is doing well.  The Cpap seems to be working well,  Cpap setting of 9 appears to be the best setting. I will send order to his DME company for this

## 2010-12-07 ENCOUNTER — Ambulatory Visit (INDEPENDENT_AMBULATORY_CARE_PROVIDER_SITE_OTHER): Payer: BC Managed Care – PPO | Admitting: *Deleted

## 2010-12-07 DIAGNOSIS — I480 Paroxysmal atrial fibrillation: Secondary | ICD-10-CM

## 2010-12-07 DIAGNOSIS — I4891 Unspecified atrial fibrillation: Secondary | ICD-10-CM

## 2010-12-08 ENCOUNTER — Encounter: Payer: Self-pay | Admitting: Critical Care Medicine

## 2010-12-15 ENCOUNTER — Ambulatory Visit: Payer: BC Managed Care – PPO | Admitting: Cardiology

## 2010-12-21 ENCOUNTER — Telehealth: Payer: Self-pay | Admitting: Critical Care Medicine

## 2010-12-21 DIAGNOSIS — G4730 Sleep apnea, unspecified: Secondary | ICD-10-CM

## 2010-12-21 NOTE — Telephone Encounter (Signed)
Call the pt and tell him his compliance with CPAP is excellent with good resolution of his apneas based on 12/20/10 report

## 2010-12-25 NOTE — Telephone Encounter (Signed)
I called and spoke with pt.  I informed him his compliance with CPAP is excellent with good resolution of his apneas based on the 12/20/10 report per Dr. Joya Gaskins.  He verbalized understanding of these results and voiced no further questions/concerns at this time.

## 2010-12-26 ENCOUNTER — Encounter: Payer: Self-pay | Admitting: Cardiology

## 2010-12-26 ENCOUNTER — Ambulatory Visit (INDEPENDENT_AMBULATORY_CARE_PROVIDER_SITE_OTHER): Payer: BC Managed Care – PPO | Admitting: Cardiology

## 2010-12-26 ENCOUNTER — Encounter (INDEPENDENT_AMBULATORY_CARE_PROVIDER_SITE_OTHER): Payer: BC Managed Care – PPO | Admitting: *Deleted

## 2010-12-26 ENCOUNTER — Encounter: Payer: Self-pay | Admitting: Critical Care Medicine

## 2010-12-26 VITALS — BP 138/80 | HR 72 | Ht 73.0 in | Wt 258.0 lb

## 2010-12-26 DIAGNOSIS — Z7901 Long term (current) use of anticoagulants: Secondary | ICD-10-CM

## 2010-12-26 DIAGNOSIS — I480 Paroxysmal atrial fibrillation: Secondary | ICD-10-CM

## 2010-12-26 DIAGNOSIS — M7989 Other specified soft tissue disorders: Secondary | ICD-10-CM

## 2010-12-26 DIAGNOSIS — R609 Edema, unspecified: Secondary | ICD-10-CM

## 2010-12-26 DIAGNOSIS — M79609 Pain in unspecified limb: Secondary | ICD-10-CM

## 2010-12-26 DIAGNOSIS — I10 Essential (primary) hypertension: Secondary | ICD-10-CM

## 2010-12-26 DIAGNOSIS — I4891 Unspecified atrial fibrillation: Secondary | ICD-10-CM

## 2010-12-26 MED ORDER — DILTIAZEM HCL ER COATED BEADS 240 MG PO CP24: 240.0000 mg | ORAL_CAPSULE | Freq: Every day | ORAL | Status: DC

## 2010-12-26 NOTE — Patient Instructions (Addendum)
Your physician has recommended you make the following change in your medication:  Stop Amlodipine  Start Diltiazem  Your physician has requested that you have a lower extremity venous duplex. This test is an ultrasound of the veins in the legs or arms. It looks at venous blood flow that carries blood from the heart to the legs or arms. Allow one hour for a Lower Venous exam. Allow thirty minutes for an Upper Venous exam. There are no restrictions or special instructions.Today at [place] wants you to follow-up in: 6 months with Dr. Verl Blalock. You will receive a reminder letter in the mail two months in advance. If you don't receive a letter, please call our office to schedule the follow-up appointment.

## 2010-12-26 NOTE — Progress Notes (Signed)
HPI Steven Shepard comes in today for evaluation and management of some edema. He had right hip surgery in July and ever since she's had swelling in both lower extremities. He is on chronic Coumadin his INRs been therapeutic over the last several months. He does have some pain in the back of his right calf when he stretches his foot upward.  He denies any chest pain, including pleuritic pain. He said no shortness of breath for months. He is not really increased his activity that much. His weight is up. He is on amlodipine.  The swelling does go down at night. Blood pressure is under good control. Past Medical History  Diagnosis Date  . DOE (dyspnea on exertion)   . Hyperlipidemia   . Hypertension   . Sleep apnea   . History of neck surgery   . Umbilical hernia     Past Surgical History  Procedure Date  . Tonsillectomy   . Skin surgery     skin cancer  . Neck surgery 2005  . Umbilical hernia repair   . Total hip arthroplasty September 18, 2010    right Dr. Maureen Ralphs    Family History  Problem Relation Age of Onset  . Coronary artery disease Mother   . Coronary artery disease Father     History   Social History  . Marital Status: Married    Spouse Name: N/A    Number of Children: N/A  . Years of Education: N/A   Occupational History  . Realtor    Social History Main Topics  . Smoking status: Former Smoker -- 1.5 packs/day for 3 years    Types: Cigarettes, Pipe    Quit date: 03/12/1974  . Smokeless tobacco: Never Used   Comment: quit appox 36 years ago  . Alcohol Use: Yes     occasionally  . Drug Use: No  . Sexually Active: Not on file   Other Topics Concern  . Not on file   Social History Narrative  . No narrative on file    Allergies  Allergen Reactions  . Ace Inhibitors   . Codeine   . Spironolactone     Increases potassium    Current Outpatient Prescriptions  Medication Sig Dispense Refill  . acetaminophen (TYLENOL) 650 MG CR tablet Take 650 mg by mouth  2 (two) times daily as needed.        Marland Kitchen aspirin 81 MG tablet Take 81 mg by mouth daily.        Marland Kitchen atorvastatin (LIPITOR) 20 MG tablet Take 20 mg by mouth daily.        . cloNIDine (CATAPRES) 0.1 MG tablet Take 0.1 mg by mouth 2 (two) times daily.        . methocarbamol (ROBAXIN) 500 MG tablet Take 500 mg by mouth 4 (four) times daily as needed.        . metoprolol (TOPROL-XL) 50 MG 24 hr tablet Take 50 mg by mouth 2 (two) times daily.       . Multiple Vitamin (MULTIVITAMIN) capsule Take 1 capsule by mouth daily.        . ranitidine (ZANTAC) 75 MG tablet Take 75 mg by mouth as needed.        . Tamsulosin HCl (FLOMAX) 0.4 MG CAPS Take 0.4 mg by mouth daily.        Marland Kitchen tiotropium (SPIRIVA) 18 MCG inhalation capsule Place 18 mcg into inhaler and inhale daily.        Marland Kitchen warfarin (COUMADIN)  5 MG tablet Take by mouth as directed.       Marland Kitchen omega-3 acid ethyl esters (LOVAZA) 1 G capsule Take 2 g by mouth 2 (two) times daily.         ROS Negative other than HPI.   PE General Appearance: well developed, well nourished in no acute distress, obese HEENT: symmetrical face, PERRLA, good dentition  Neck: no JVD, thyromegaly, or adenopathy, trachea midline Chest: symmetric without deformity Cardiac: PMI non-displaced, RRR, normal S1, S2, no gallop or murmur Lung: clear to ausculation and percussion Vascular: all pulses full without bruits  Abdominal: nondistended, nontender, good bowel sounds, no HSM, no bruits Extremities: no cyanosis, clubbing ,2+ pitting edema bilaterally in the lower extremities,no sign of DVT, no varicosities  Skin: normal color, no rashes Neuro: alert and oriented x 3, non-focal Pysch: normal affect Filed Vitals:   12/26/10 1421  BP: 138/80  Pulse: 72  Height: 6' 1"  (1.854 m)  Weight: 258 lb (117.028 kg)    EKG Normal sinus rhythm normal EKG Labs and Studies Reviewed.   Lab Results  Component Value Date   WBC 13.1* 09/21/2010   HGB 8.6* 09/21/2010   HCT 26.0* 09/21/2010    MCV 91.2 09/21/2010   PLT 144* 09/21/2010      Chemistry      Component Value Date/Time   NA 137 09/20/2010 0443   K 4.3 09/20/2010 0443   CL 104 09/20/2010 0443   CO2 29 09/20/2010 0443   BUN 19 09/20/2010 0443   CREATININE 0.86 09/20/2010 0443      Component Value Date/Time   CALCIUM 8.3* 09/20/2010 0443   ALKPHOS 61 09/11/2010 1400   AST 56* 09/11/2010 1400   ALT 118* 09/11/2010 1400   BILITOT 1.1 09/11/2010 1400       No results found for this basename: CHOL   No results found for this basename: HDL   No results found for this basename: LDLCALC   No results found for this basename: TRIG   No results found for this basename: CHOLHDL   Lab Results  Component Value Date   HGBA1C  Value: 6.0 (NOTE)                                                                       According to the ADA Clinical Practice Recommendations for 2011, when HbA1c is used as a screening test:   >=6.5%   Diagnostic of Diabetes Mellitus           (if abnormal result  is confirmed)  5.7-6.4%   Increased risk of developing Diabetes Mellitus  References:Diagnosis and Classification of Diabetes Mellitus,Diabetes FKCL,2751,70(YFVCB 1):S62-S69 and Standards of Medical Care in         Diabetes - 2011,Diabetes Care,2011,34  (Suppl 1):S11-S61.* 01/05/2010   Lab Results  Component Value Date   ALT 118* 09/11/2010   AST 56* 09/11/2010   ALKPHOS 61 09/11/2010   BILITOT 1.1 09/11/2010   No results found for this basename: TSH

## 2010-12-26 NOTE — Assessment & Plan Note (Signed)
This could all be from amlodipine and decreased mobility. His INRs have been therapeutic with his right calf pain we will check venous Dopplers. I switched his amlodipine to diltiazem extended release 240 mg per day. I've encouraged increased activity.

## 2011-01-03 ENCOUNTER — Encounter: Payer: Self-pay | Admitting: Cardiology

## 2011-01-04 ENCOUNTER — Ambulatory Visit (INDEPENDENT_AMBULATORY_CARE_PROVIDER_SITE_OTHER): Payer: BC Managed Care – PPO | Admitting: *Deleted

## 2011-01-04 DIAGNOSIS — I480 Paroxysmal atrial fibrillation: Secondary | ICD-10-CM

## 2011-01-04 DIAGNOSIS — I4891 Unspecified atrial fibrillation: Secondary | ICD-10-CM

## 2011-01-04 DIAGNOSIS — Z7901 Long term (current) use of anticoagulants: Secondary | ICD-10-CM

## 2011-01-12 ENCOUNTER — Telehealth: Payer: Self-pay | Admitting: Hematology & Oncology

## 2011-01-12 NOTE — Telephone Encounter (Signed)
Left pt message to call and schedule appointment

## 2011-01-12 NOTE — Telephone Encounter (Signed)
Talked to Pt about coming in to see MD 11-13 he has an appointment for another MD at 2pm. Talked to Dr. Marin Olp he said to have Pt come in at 8am. Pt said he has a Dental appointment that day and will call me back

## 2011-01-12 NOTE — Telephone Encounter (Signed)
Per MD review left message to call and schedule apoointment

## 2011-01-12 NOTE — Telephone Encounter (Signed)
Pt made 11-13 appointment

## 2011-01-22 ENCOUNTER — Other Ambulatory Visit: Payer: Self-pay | Admitting: Hematology & Oncology

## 2011-01-22 ENCOUNTER — Encounter: Payer: Self-pay | Admitting: Hematology & Oncology

## 2011-01-22 DIAGNOSIS — D589 Hereditary hemolytic anemia, unspecified: Secondary | ICD-10-CM

## 2011-01-22 HISTORY — DX: Hereditary hemolytic anemia, unspecified: D58.9

## 2011-01-23 ENCOUNTER — Ambulatory Visit: Payer: BC Managed Care – PPO

## 2011-01-23 ENCOUNTER — Ambulatory Visit (HOSPITAL_BASED_OUTPATIENT_CLINIC_OR_DEPARTMENT_OTHER): Payer: BC Managed Care – PPO | Admitting: Hematology & Oncology

## 2011-01-23 ENCOUNTER — Ambulatory Visit (INDEPENDENT_AMBULATORY_CARE_PROVIDER_SITE_OTHER): Payer: BC Managed Care – PPO | Admitting: Pulmonary Disease

## 2011-01-23 ENCOUNTER — Other Ambulatory Visit (HOSPITAL_BASED_OUTPATIENT_CLINIC_OR_DEPARTMENT_OTHER): Payer: BC Managed Care – PPO | Admitting: Lab

## 2011-01-23 ENCOUNTER — Encounter: Payer: Self-pay | Admitting: Hematology & Oncology

## 2011-01-23 ENCOUNTER — Encounter: Payer: Self-pay | Admitting: Pulmonary Disease

## 2011-01-23 VITALS — BP 147/80 | HR 73 | Temp 98.1°F

## 2011-01-23 DIAGNOSIS — D589 Hereditary hemolytic anemia, unspecified: Secondary | ICD-10-CM

## 2011-01-23 DIAGNOSIS — Z7901 Long term (current) use of anticoagulants: Secondary | ICD-10-CM

## 2011-01-23 DIAGNOSIS — D591 Autoimmune hemolytic anemia, unspecified: Secondary | ICD-10-CM

## 2011-01-23 DIAGNOSIS — D5912 Cold autoimmune hemolytic anemia: Secondary | ICD-10-CM | POA: Insufficient documentation

## 2011-01-23 DIAGNOSIS — G4730 Sleep apnea, unspecified: Secondary | ICD-10-CM

## 2011-01-23 DIAGNOSIS — I480 Paroxysmal atrial fibrillation: Secondary | ICD-10-CM

## 2011-01-23 DIAGNOSIS — I4891 Unspecified atrial fibrillation: Secondary | ICD-10-CM

## 2011-01-23 HISTORY — DX: Cold autoimmune hemolytic anemia: D59.12

## 2011-01-23 LAB — CBC WITH DIFFERENTIAL (CANCER CENTER ONLY)
BASO#: 0 10*3/uL (ref 0.0–0.2)
Eosinophils Absolute: 0.1 10*3/uL (ref 0.0–0.5)
HCT: UNDETERMINED % (ref 38.7–49.9)
HGB: 13.6 g/dL (ref 13.0–17.1)
MCH: UNDETERMINED pg (ref 28.0–33.4)
MCHC: UNDETERMINED g/dL (ref 32.0–35.9)
MCV: UNDETERMINED fL (ref 82–98)
MONO%: 10 % (ref 0.0–13.0)
NEUT#: 3.4 10*3/uL (ref 1.5–6.5)
NEUT%: 58.8 % (ref 40.0–80.0)
RBC: UNDETERMINED 10*6/uL (ref 4.20–5.70)

## 2011-01-23 NOTE — Patient Instructions (Signed)
Your CPAP set at 9cm We discussed supplies & care of your machine

## 2011-01-23 NOTE — Progress Notes (Signed)
This office note has been dictated. CSN: [id number]

## 2011-01-23 NOTE — Progress Notes (Signed)
  Subjective:    Patient ID: Steven Shepard, male    DOB: [birth year], 65 y.o.   MRN: [id number]  HPI PCP - Charleston Poot  64/M, ex smoker, realtor with mild COPD for FU of obstructive sleep apnea .  He was dx with "borderline" osa approx 5 to 6 when he had a sleep study done at [facility] and was never started on cpap. Pt states his wife tells him he stops breathing when sleeping.  ESS 8. Bedtime 10-11p, bedtime meds include clonidine, lipitor & metoprolol. Latency 10-15 mins, 1-2 awakenings , nocturia +, sleeps on his side with 1 pillow, loud snoring & apneas witnessed by wife. He was hospitalised for hip replacement & needed oxygen during sleep. He has gained 30 lbs over the last 2 yrs.  PSG showed severe obstructive sleep apnea with AHI 86/h corrected by CPAP 9 cm  Started on CPAP 9cm, small nasal comfort gel mask, heated humidity, C-flex setting of 2 cm -  01/23/2011 Reviewed downloads sep & oct' 12 >> excellent compliance 7.5 h on 9 cm, no leak  He is tolertaing this well, feels more refreshed, no leak, mask ok, pressure ok    Review of Systems Patient denies significant dyspnea,cough, hemoptysis,  chest pain, palpitations, pedal edema, orthopnea, paroxysmal nocturnal dyspnea, lightheadedness, nausea, vomiting, abdominal or  leg pains      Objective:   Physical Exam  Gen. Pleasant, well-nourished, in no distress ENT - no lesions, no post nasal drip Neck: No JVD, no thyromegaly, no carotid bruits Lungs: no use of accessory muscles, no dullness to percussion, clear without rales or rhonchi  Cardiovascular: Rhythm regular, heart sounds  normal, no murmurs or gallops, no peripheral edema Musculoskeletal: No deformities, no cyanosis or clubbing        Assessment & Plan:

## 2011-01-23 NOTE — Assessment & Plan Note (Signed)
8/12 PSG showed severe obstructive sleep apnea with AHI 86/h  corrected by CPAP 9 cm 11/12 >> excellent compliance on downloads Reviewed care of CPPA machine, supplies & compliance issues Weight loss encouraged, compliance with goal of at least 4-6 hrs every night is the expectation. Advised against medications with sedative side effects Cautioned against driving when sleepy - understanding that sleepiness will vary on a day to day basis Reassess if wt fluctuates by 20 lbs

## 2011-01-23 NOTE — Progress Notes (Signed)
CC:   Tammy R. Modena Morrow, M.D. Amy C. Verl Blalock, MD, [facility]. 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  HISTORY OF PRESENT ILLNESS:  Steven Shepard is a very nice 65 year old white gentleman.  He is followed by Dr. Florina Ou.  He has several medical issues.  He does have a history of atrial fibrillation.  He is on Coumadin.  He has hyperlipidemia.  He has hypertension.  He does have a history of past tobacco use.  He had a hip surgery, I think, for his right hip back in July.  He said at that time they had some issues with his blood.  Apparently, he has been having difficulties with getting his blood work done.  Going back to 2010, a CBC was done which showed a white cell count of 7.4, hemoglobin 12.9, hematocrit 27.9, and platelet count 171.  This, I believe, was an error.  He had lab work done in December 2011.  At that point in time, his hemoglobin was 12.4.  It was reported, however, that there was some interference that indicated the presence of a cold agglutinin.  Dr. Modena Morrow felt that a formal hematologic evaluation was indicated and, as such, referred to the [facility].  He has had lab work done recently.  He did have hepatitis panel done which was all negative.  In June 2012, a CBC showed a white cell count of 6.2, hemoglobin 12 and 13.4, hematocrit 32.3, and platelet count 173.  MCV was 88.  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  He has not had any weight loss or weight gain.  He has had no fevers, sweats, or chills.  He has had no nausea or vomiting.  There has been no change in medications.  He says that his wife has noted that when he goes out in cold weather that his nose does turn "blue."  His past medical history is remarkable for: 1. Hyperlipidemia. 2. Hypertension. 3. Sleep apnea. 4. Paroxysmal atrial fibrillation.  ALLERGIES:  ACE  inhibitors.  MEDICATIONS:  Aspirin 81 mg p.o. daily, Lipitor 20 mg p.o. daily, clonidine 0.1 mg p.o. b.i.d., Cardizem CD 240 mg p.o. daily, Toprol-XL 100 mg p.o. daily, Zantac 75 mg p.o. daily p.r.n., Flomax 0.4 mg p.o. daily, Coumadin daily, Spiriva inhaler as indicated.  SOCIAL HISTORY:  There is a past history of tobacco use.  He has not smoked for quite a while.  He has rare alcohol use.  FAMILY HISTORY:  Noncontributory.  PHYSICAL EXAM:  General:  This is a well-developed, well-nourished white gentleman in no obvious distress.  Vital Signs:  Temperature of 98.4. Pulse is 72, respiratory rate 16, blood pressure 138/80.  Weight is 258. Head and Neck Exam:  Normocephalic, atraumatic skull.  There are no ocular or oral lesions.  There are no palpable cervical or supraclavicular lymph nodes.  Thyroid is not palpable.  There is no scleral icterus.  Lungs:  Clear bilaterally.  Cardiac Exam:  Regular rate and rhythm with normal S1 and S2.  There are no murmurs, rubs, or bruits.  Abdominal Exam:  Soft with good bowel sounds.  There is no palpable abdominal mass.  There is no palpable hepatosplenomegaly.  Back Exam:  No tenderness over the spine, ribs, or hips.  Extremities:  No clubbing, cyanosis, or edema.  He has good range motion of his joints. He has good pulses in distal  extremities.  Skin Exam:  No rashes, ecchymosis, or petechia.  Neurological Exam:  No focal neurological deficits.  His laboratory studies are pending.  IMPRESSION:  [name] is a 65 year old gentleman with, in all likelihood, cold agglutinin disease.  He appears to be relatively asymptomatic with this.  The Lab is having to run this under warm conditions.  I asked for a cold agglutinin titer on him.  I still could not find anything on his physical exam that would indicate a source for the cold agglutinin disease.  However, we probably will need to send him over to CT scan to rule out an underlying  lymphoma.  I do recommend that [name] be started on folic acid.  I think 1 mg of folic acid a day would be appropriate for him.  I gave him instructions as to how to protect himself in the cold weather.  He sort of has been doing this already.  I do not see need for a bone marrow biopsy on [name] at the present time.  I do not think we need to get him back to the office unless he is having problems.  I told [name] to watch out for shortness breath and fatigue.  These may be indicators of worsening anemia.  If we do need to treat [name], I probably would consider Rituxan or prednisone.  I spent a good hour or more with [name].  He is a real nice guy. He really is from [place], [place].  We talked about that quite a bit.    ______________________________ Volanda Napoleon, M.D. PRE/MEDQ  D:  01/23/2011  T:  01/23/2011  Job:  [id number]

## 2011-01-24 LAB — COMPREHENSIVE METABOLIC PANEL
Albumin: 4.2 g/dL (ref 3.5–5.2)
Alkaline Phosphatase: 76 U/L (ref 39–117)
BUN: 17 mg/dL (ref 6–23)
Calcium: 9.6 mg/dL (ref 8.4–10.5)
Creatinine, Ser: 1.08 mg/dL (ref 0.50–1.35)
Glucose, Bld: 97 mg/dL (ref 70–99)
Potassium: 4.2 mEq/L (ref 3.5–5.3)

## 2011-01-24 LAB — RETICULOCYTES (CHCC)

## 2011-01-24 LAB — HAPTOGLOBIN: Haptoglobin: 117 mg/dL (ref 30–200)

## 2011-01-31 ENCOUNTER — Other Ambulatory Visit: Payer: Self-pay

## 2011-02-14 ENCOUNTER — Other Ambulatory Visit: Payer: Self-pay | Admitting: Hematology & Oncology

## 2011-02-14 DIAGNOSIS — D5912 Cold autoimmune hemolytic anemia: Secondary | ICD-10-CM

## 2011-02-15 ENCOUNTER — Telehealth: Payer: Self-pay | Admitting: *Deleted

## 2011-02-15 ENCOUNTER — Ambulatory Visit (INDEPENDENT_AMBULATORY_CARE_PROVIDER_SITE_OTHER): Payer: BC Managed Care – PPO | Admitting: *Deleted

## 2011-02-15 ENCOUNTER — Encounter: Payer: BC Managed Care – PPO | Admitting: *Deleted

## 2011-02-15 DIAGNOSIS — I4891 Unspecified atrial fibrillation: Secondary | ICD-10-CM

## 2011-02-15 DIAGNOSIS — Z7901 Long term (current) use of anticoagulants: Secondary | ICD-10-CM

## 2011-02-15 DIAGNOSIS — I480 Paroxysmal atrial fibrillation: Secondary | ICD-10-CM

## 2011-02-15 NOTE — Telephone Encounter (Signed)
Mailed 04-2011 schedule

## 2011-03-28 ENCOUNTER — Other Ambulatory Visit: Payer: Self-pay | Admitting: Neurosurgery

## 2011-03-28 DIAGNOSIS — M5030 Other cervical disc degeneration, unspecified cervical region: Secondary | ICD-10-CM

## 2011-03-28 DIAGNOSIS — M5412 Radiculopathy, cervical region: Secondary | ICD-10-CM

## 2011-03-28 DIAGNOSIS — M47812 Spondylosis without myelopathy or radiculopathy, cervical region: Secondary | ICD-10-CM

## 2011-03-29 ENCOUNTER — Ambulatory Visit (INDEPENDENT_AMBULATORY_CARE_PROVIDER_SITE_OTHER): Payer: BC Managed Care – PPO | Admitting: *Deleted

## 2011-03-29 DIAGNOSIS — Z7901 Long term (current) use of anticoagulants: Secondary | ICD-10-CM

## 2011-03-29 DIAGNOSIS — I480 Paroxysmal atrial fibrillation: Secondary | ICD-10-CM

## 2011-03-29 DIAGNOSIS — I4891 Unspecified atrial fibrillation: Secondary | ICD-10-CM

## 2011-03-29 LAB — POCT INR: INR: 2.2

## 2011-04-03 ENCOUNTER — Ambulatory Visit
Admission: RE | Admit: 2011-04-03 | Discharge: 2011-04-03 | Disposition: A | Discharge status: Home / Self Care | Payer: BC Managed Care – PPO | Source: Ambulatory Visit | Attending: Neurosurgery | Admitting: Neurosurgery

## 2011-04-03 DIAGNOSIS — M47812 Spondylosis without myelopathy or radiculopathy, cervical region: Secondary | ICD-10-CM

## 2011-04-03 DIAGNOSIS — M5412 Radiculopathy, cervical region: Secondary | ICD-10-CM

## 2011-04-03 DIAGNOSIS — M5030 Other cervical disc degeneration, unspecified cervical region: Secondary | ICD-10-CM

## 2011-04-18 ENCOUNTER — Other Ambulatory Visit: Payer: Self-pay | Admitting: Neurosurgery

## 2011-04-18 DIAGNOSIS — M67919 Unspecified disorder of synovium and tendon, unspecified shoulder: Secondary | ICD-10-CM

## 2011-04-18 DIAGNOSIS — M48061 Spinal stenosis, lumbar region without neurogenic claudication: Secondary | ICD-10-CM

## 2011-04-18 DIAGNOSIS — M25512 Pain in left shoulder: Secondary | ICD-10-CM

## 2011-04-26 ENCOUNTER — Ambulatory Visit
Admission: RE | Admit: 2011-04-26 | Discharge: 2011-04-26 | Disposition: A | Discharge status: Home / Self Care | Payer: BC Managed Care – PPO | Source: Ambulatory Visit | Attending: Neurosurgery | Admitting: Neurosurgery

## 2011-04-26 DIAGNOSIS — M67919 Unspecified disorder of synovium and tendon, unspecified shoulder: Secondary | ICD-10-CM

## 2011-05-09 ENCOUNTER — Other Ambulatory Visit (HOSPITAL_BASED_OUTPATIENT_CLINIC_OR_DEPARTMENT_OTHER): Payer: BC Managed Care – PPO | Admitting: Lab

## 2011-05-09 ENCOUNTER — Ambulatory Visit (HOSPITAL_BASED_OUTPATIENT_CLINIC_OR_DEPARTMENT_OTHER): Payer: BC Managed Care – PPO | Admitting: Hematology & Oncology

## 2011-05-09 DIAGNOSIS — D591 Autoimmune hemolytic anemia, unspecified: Secondary | ICD-10-CM

## 2011-05-09 DIAGNOSIS — D589 Hereditary hemolytic anemia, unspecified: Secondary | ICD-10-CM

## 2011-05-09 DIAGNOSIS — I4891 Unspecified atrial fibrillation: Secondary | ICD-10-CM

## 2011-05-09 DIAGNOSIS — D5912 Cold autoimmune hemolytic anemia: Secondary | ICD-10-CM

## 2011-05-09 LAB — CBC WITH DIFFERENTIAL (CANCER CENTER ONLY)
BASO%: 0.4 % (ref 0.0–2.0)
EOS%: 1.1 % (ref 0.0–7.0)
LYMPH#: 1.8 10*3/uL (ref 0.9–3.3)
LYMPH%: 25 % (ref 14.0–48.0)
MCV: UNDETERMINED fL (ref 82–98)
MONO#: 0.6 10*3/uL (ref 0.1–0.9)
Platelets: 240 10*3/uL (ref 145–400)
WBC: 7.1 10*3/uL (ref 4.0–10.0)

## 2011-05-09 NOTE — Progress Notes (Signed)
This office note has been dictated.

## 2011-05-10 ENCOUNTER — Ambulatory Visit (INDEPENDENT_AMBULATORY_CARE_PROVIDER_SITE_OTHER): Payer: BC Managed Care – PPO | Admitting: *Deleted

## 2011-05-10 DIAGNOSIS — I480 Paroxysmal atrial fibrillation: Secondary | ICD-10-CM

## 2011-05-10 DIAGNOSIS — I4891 Unspecified atrial fibrillation: Secondary | ICD-10-CM

## 2011-05-10 DIAGNOSIS — Z7901 Long term (current) use of anticoagulants: Secondary | ICD-10-CM

## 2011-05-10 NOTE — Progress Notes (Signed)
CC:   Tammy R. Modena Morrow, M.D. Blakeley C. Verl Blalock, MD, [facility]. Joya Gaskins, MD, FCCP  DIAGNOSIS:  Cold agglutinin disease.  CURRENT THERAPY:  Folic acid 1 mg p.o. daily.  INTERIM HISTORY:  Steven Shepard comes in for follow-up.  I first saw him back in November.  At that point in time, we did a workup for his hemolytic anemia.  So far his workup has been pretty much unremarkable.  He has had an MRI of his cervical spine and shoulder.  This was done in January.  Everything looked okay with respect to his bone marrow.  His lab results when we first saw him all were fairly much unremarkable. His blood was positive for complement.  He had a normal haptoglobin.  A cold agglutinin titer was to have been sent off.  Unfortunately, I do not think we can find the results in the Epic system.  He has had no problems with his hands or feet.  He has not noticed any change in color with or temperature when he gets out in the cold weather.  He has had no headache.  He has had some cough.  He did smoke in the past.  PHYSICAL EXAMINATION:  General Appearance:  This is a well-developed, well-nourished white gentleman in no obvious distress.  Vital Signs: Show a temperature of 97.9, pulse 74, respiratory rate 18, blood pressure 160/88.  Weight is 257.  Head and Neck Exam:  Shows a normocephalic, atraumatic skull.  There is no scleral icterus.  There are no ocular or oral lesions.  He has no palpable cervical or supraclavicular lymph nodes.  Lungs:  Clear bilaterally.  He has no rales, wheezes or rhonchi.  Cardiac Exam:  Regular rate and rhythm with an occasional extra beat.  He has no murmurs, rubs or bruits.  Abdominal Exam:  Soft with good bowel sounds.  He is mildly obese.  He has no fluid wave.  There is no palpable hepatosplenomegaly.  Back Exam:  No tenderness over the spine, ribs or hips.  Extremities:  Show no clubbing, cyanosis or edema.  Neurological Exam:  Shows no focal neurological  deficits.  LABORATORY STUDIES:  White cell count is 7.1, hemoglobin 13, and platelet count is 240.  His white cell differential shows 65 segs, 25 lymphocytes.  IMPRESSION:  Steven Shepard is a 66 year old gentleman with cold agglutinin hemolytic anemia.  His hemolysis really is minimal at this point.  Again, the folic acid is going to be very key for Steven Shepard.  At this point, we will plan to get him back in 6 months.  Within one more month, I really believe that he will do well.  He is on Coumadin for the atrial fibrillation.  This actually may help him out a little bit.    ______________________________ Volanda Napoleon, M.D. PRE/MEDQ  D:  05/09/2011  T:  05/10/2011  Job:  [id number]

## 2011-05-13 LAB — RETICULOCYTES (CHCC)

## 2011-05-13 LAB — LACTATE DEHYDROGENASE: LDH: 187 U/L (ref 94–250)

## 2011-05-13 LAB — COLD AGGLUTININ TITER: Cold Agglutinin Titer: 1:2560 {titer} — AB

## 2011-06-13 ENCOUNTER — Encounter: Payer: Self-pay | Admitting: Cardiology

## 2011-06-13 ENCOUNTER — Ambulatory Visit (INDEPENDENT_AMBULATORY_CARE_PROVIDER_SITE_OTHER): Payer: BC Managed Care – PPO | Admitting: Cardiology

## 2011-06-13 VITALS — BP 158/78 | HR 66 | Ht 73.0 in | Wt 257.0 lb

## 2011-06-13 DIAGNOSIS — R609 Edema, unspecified: Secondary | ICD-10-CM

## 2011-06-13 DIAGNOSIS — I4891 Unspecified atrial fibrillation: Secondary | ICD-10-CM

## 2011-06-13 DIAGNOSIS — I480 Paroxysmal atrial fibrillation: Secondary | ICD-10-CM

## 2011-06-13 DIAGNOSIS — I10 Essential (primary) hypertension: Secondary | ICD-10-CM

## 2011-06-13 DIAGNOSIS — E785 Hyperlipidemia, unspecified: Secondary | ICD-10-CM

## 2011-06-13 DIAGNOSIS — Z7901 Long term (current) use of anticoagulants: Secondary | ICD-10-CM

## 2011-06-13 NOTE — Patient Instructions (Signed)
Your physician recommends that you continue on your current medications as directed. Please refer to the Current Medication list given to you today.  Your physician wants you to follow-up in: 1 year with Dr. Verl Blalock. You will receive a reminder letter in the mail two months in advance. If you don't receive a letter, please call our office to schedule the follow-up appointment.

## 2011-06-13 NOTE — Assessment & Plan Note (Signed)
Stable. No clinical recurrence. Continue current meds including anticoagulation.

## 2011-06-13 NOTE — Progress Notes (Signed)
HPI Steven Shepard comes in today for evaluation and management of his history of difficult to control hypertension and also history of paroxysmal A. fib. He has occasional palpitations for  brief periods. He remains on Coumadin. He denies any bleeding or melena.  His regular blood work checked by Dr.Spear. He does get annual physical. He is compliant with medications. Does not check his blood pressures however.  He denies any chest pain or angina. He has no claudication or symptoms of TIAs.  Past Medical History  Diagnosis Date  . DOE (dyspnea on exertion)   . Hyperlipidemia   . Hypertension   . Sleep apnea   . History of neck surgery   . Umbilical hernia   . Anemia, hemolytic 01/22/2011  . 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 acetaminophen (TYLENOL) 650 MG CR tablet Take 650 mg by mouth 2 (two) times daily as needed.        Marland Kitchen aspirin 81 MG tablet Take 81 mg by mouth daily.        Marland Kitchen atorvastatin (LIPITOR) 20 MG tablet Take 20 mg by mouth daily.        . cloNIDine (CATAPRES) 0.1 MG tablet Take 0.1 mg by mouth 2 (two) times daily.        Marland Kitchen diltiazem (CARDIZEM CD) 240 MG 24 hr capsule Take 1 capsule (240 mg total) by mouth daily.  90 capsule  3  . folic acid (FOLVITE) 037 MCG tablet Take 400 mcg by mouth 2 (two) times daily.      . methocarbamol (ROBAXIN) 500 MG tablet Take 500 mg by mouth 4 (four) times daily as needed.        . metoprolol (TOPROL-XL) 50 MG 24 hr tablet Take 50 mg by mouth 2 (two) times daily.       . Multiple Vitamin (MULTIVITAMIN) capsule Take 1 capsule by mouth daily.        . ranitidine (ZANTAC) 75 MG tablet Take 75 mg by mouth 2 (two) times daily as needed.      . Tamsulosin HCl (FLOMAX) 0.4 MG CAPS Take 0.4 mg by mouth daily.        Marland Kitchen tiotropium (SPIRIVA) 18 MCG inhalation capsule Place 18 mcg into inhaler and inhale daily.        Marland Kitchen warfarin (COUMADIN) 5 MG tablet Take by mouth as directed.          Allergies  Allergen Reactions  . Ace Inhibitors Other (See Comments)  . Codeine   . Spironolactone     Increases potassium    Family History  Problem Relation Age of Onset  . Coronary artery disease Mother   . Coronary artery disease Father     History   Social History  . Marital Status: Married    Spouse Name: N/A    Number of Children: N/A  . Years of Education: N/A   Occupational History  . Realtor    Social History Main Topics  . Smoking status: Former Smoker -- 1.5 packs/day for 3 years    Types: Cigarettes, Pipe    Quit date: 03/12/1974  . Smokeless tobacco: Never Used   Comment: quit appox 36 years ago  . Alcohol Use: Yes     occasionally  . Drug Use: No  . Sexually Active: Not on file   Other Topics Concern  . Not on file   Social History Narrative  . No narrative on file  ROS ALL NEGATIVE EXCEPT THOSE NOTED IN HPI  PE  General Appearance: well developed, well nourished in no acute distress,obese HEENT: symmetrical face, PERRLA, good dentition  Neck: no JVD, thyromegaly, or adenopathy, trachea midline Chest: symmetric without deformity Cardiac: PMI non-displaced, RRR, normal S1, S2, no gallop or murmur Lung: clear to ausculation and percussion Vascular: all pulses full without bruits  Abdominal: nondistended, nontender, good bowel sounds, no HSM, no bruits Extremities: no cyanosis, clubbing, 1+ edema no sign of DVT, no varicosities  Skin: normal color, no rashes Neuro: alert and oriented x 3, non-focal Pysch: normal affect  EKG Normal sinus rhythm, first AV block, no acute changes. BMET    Component Value Date/Time   NA 139 01/23/2011 0821   K 4.2 01/23/2011 0821   CL 103 01/23/2011 0821   CO2 25 01/23/2011 0821   GLUCOSE 97 01/23/2011 0821   BUN 17 01/23/2011 0821   CREATININE 1.08 01/23/2011 0821   CALCIUM 9.6 01/23/2011 0821   GFRNONAA >60 09/20/2010 0443   GFRAA >60 09/20/2010 0443    Lipid Panel  No results found for  this basename: chol, trig, hdl, cholhdl, vldl, ldlcalc    CBC    Component Value Date/Time   WBC 7.1 05/09/2011 0933   WBC 13.1* 09/21/2010 0442   RBC 2.85* 09/21/2010 0442   HGB 13.0 05/09/2011 0933   HGB 8.6* 09/21/2010 0442   HCT Unable to Determine 05/09/2011 0933   HCT 26.0* 09/21/2010 0442   PLT 240 Platelet count confirmed by slide estimate 05/09/2011 0933   PLT 144* 09/21/2010 0442   MCV Unable to Determine 05/09/2011 0933   MCV 91.2 09/21/2010 0442   MCH Unable to Determine 05/09/2011 0933   MCH 30.2 09/21/2010 0442   MCHC Unable to Determine 05/09/2011 0933   MCHC 33.1 09/21/2010 0442   RDW Cold Agglutinin 05/09/2011 0933   RDW 15.2 09/21/2010 0442   LYMPHSABS 1.8 05/09/2011 0933   LYMPHSABS 1.5 01/04/2010 1533   MONOABS 0.4 01/04/2010 1533   EOSABS 0.1 05/09/2011 0933   EOSABS 0.0 01/04/2010 1533   BASOSABS 0.0 05/09/2011 0933   BASOSABS 0.0 01/04/2010 1533

## 2011-06-21 ENCOUNTER — Ambulatory Visit (INDEPENDENT_AMBULATORY_CARE_PROVIDER_SITE_OTHER): Payer: BC Managed Care – PPO

## 2011-06-21 DIAGNOSIS — Z7901 Long term (current) use of anticoagulants: Secondary | ICD-10-CM

## 2011-06-21 DIAGNOSIS — I4891 Unspecified atrial fibrillation: Secondary | ICD-10-CM

## 2011-06-21 DIAGNOSIS — I480 Paroxysmal atrial fibrillation: Secondary | ICD-10-CM

## 2011-06-21 LAB — POCT INR: INR: 2.4

## 2011-08-02 ENCOUNTER — Ambulatory Visit (INDEPENDENT_AMBULATORY_CARE_PROVIDER_SITE_OTHER): Payer: BC Managed Care – PPO

## 2011-08-02 DIAGNOSIS — I4891 Unspecified atrial fibrillation: Secondary | ICD-10-CM

## 2011-08-02 DIAGNOSIS — Z7901 Long term (current) use of anticoagulants: Secondary | ICD-10-CM

## 2011-08-02 DIAGNOSIS — I480 Paroxysmal atrial fibrillation: Secondary | ICD-10-CM

## 2011-08-02 LAB — POCT INR: INR: 2.5

## 2011-09-18 ENCOUNTER — Ambulatory Visit (INDEPENDENT_AMBULATORY_CARE_PROVIDER_SITE_OTHER): Payer: BC Managed Care – PPO | Admitting: Pharmacist

## 2011-09-18 DIAGNOSIS — Z7901 Long term (current) use of anticoagulants: Secondary | ICD-10-CM

## 2011-09-18 DIAGNOSIS — I480 Paroxysmal atrial fibrillation: Secondary | ICD-10-CM

## 2011-09-18 DIAGNOSIS — I4891 Unspecified atrial fibrillation: Secondary | ICD-10-CM

## 2011-09-18 LAB — POCT INR: INR: 3.2

## 2011-10-16 ENCOUNTER — Ambulatory Visit (INDEPENDENT_AMBULATORY_CARE_PROVIDER_SITE_OTHER): Payer: BC Managed Care – PPO | Admitting: *Deleted

## 2011-10-16 DIAGNOSIS — I480 Paroxysmal atrial fibrillation: Secondary | ICD-10-CM

## 2011-10-16 DIAGNOSIS — Z7901 Long term (current) use of anticoagulants: Secondary | ICD-10-CM

## 2011-10-16 DIAGNOSIS — I4891 Unspecified atrial fibrillation: Secondary | ICD-10-CM

## 2011-10-16 LAB — POCT INR: INR: 3.2

## 2011-11-08 ENCOUNTER — Ambulatory Visit (INDEPENDENT_AMBULATORY_CARE_PROVIDER_SITE_OTHER): Payer: BC Managed Care – PPO | Admitting: *Deleted

## 2011-11-08 DIAGNOSIS — Z7901 Long term (current) use of anticoagulants: Secondary | ICD-10-CM

## 2011-11-08 DIAGNOSIS — I480 Paroxysmal atrial fibrillation: Secondary | ICD-10-CM

## 2011-11-08 DIAGNOSIS — I4891 Unspecified atrial fibrillation: Secondary | ICD-10-CM

## 2011-11-08 LAB — POCT INR: INR: 2.2

## 2011-11-21 ENCOUNTER — Other Ambulatory Visit (HOSPITAL_BASED_OUTPATIENT_CLINIC_OR_DEPARTMENT_OTHER): Payer: BC Managed Care – PPO | Admitting: Lab

## 2011-11-21 ENCOUNTER — Ambulatory Visit (HOSPITAL_BASED_OUTPATIENT_CLINIC_OR_DEPARTMENT_OTHER): Payer: BC Managed Care – PPO | Admitting: Hematology & Oncology

## 2011-11-21 VITALS — BP 147/81 | HR 66 | Temp 97.6°F | Resp 20 | Ht 73.0 in | Wt 254.0 lb

## 2011-11-21 DIAGNOSIS — D591 Autoimmune hemolytic anemia, unspecified: Secondary | ICD-10-CM

## 2011-11-21 DIAGNOSIS — D589 Hereditary hemolytic anemia, unspecified: Secondary | ICD-10-CM

## 2011-11-21 DIAGNOSIS — D5912 Cold autoimmune hemolytic anemia: Secondary | ICD-10-CM

## 2011-11-21 LAB — CBC WITH DIFFERENTIAL (CANCER CENTER ONLY)
BASO#: 0 10*3/uL (ref 0.0–0.2)
BASO%: 0.6 % (ref 0.0–2.0)
EOS%: 1.4 % (ref 0.0–7.0)
HGB: 13.5 g/dL (ref 13.0–17.1)
LYMPH#: 1.9 10*3/uL (ref 0.9–3.3)
MCH: UNDETERMINED pg (ref 28.0–33.4)
MCHC: UNDETERMINED g/dL (ref 32.0–35.9)
MONO%: 9.6 % (ref 0.0–13.0)
NEUT#: 3.9 10*3/uL (ref 1.5–6.5)
Platelets: 172 10*3/uL (ref 145–400)

## 2011-11-21 LAB — CHCC SATELLITE - SMEAR

## 2011-11-21 NOTE — Progress Notes (Signed)
This office note has been dictated.

## 2011-11-22 NOTE — Progress Notes (Signed)
CC:   Tammy R. Modena Morrow, M.D. Jaques C. Verl Blalock, MD, [facility]. Joya Gaskins, MD, FCCP  DIAGNOSIS:  Cold agglutinin disease.  CURRENT THERAPY:  Folic acid 1 mg p.o. daily.  INTERIM HISTORY:  Mr. Barreiro comes in for followup.  I think we last saw him back in February.  He did have some lab work done in I think May.  He has had no problems with the cold agglutinins.  He is on folic acid. He is working.  He is having no problem with fatigue or weakness.  He has had no change in his other medications.  He is on Coumadin.  There has been no bleeding from the Coumadin.  He also takes low-dose aspirin.  PHYSICAL EXAMINATION:  This is a well-developed, well-nourished white gentleman in no obvious distress.  Vital signs:  Temperature of 97.6, pulse 66, respiratory rate 20, blood pressure 147/81.  Weight is 254. Head and neck:  Normocephalic, atraumatic skull.  There are no ocular or oral lesions.  There are no palpable cervical or supraclavicular lymph nodes.  Lungs:  Clear bilaterally.  Cardiac:  Regular rate and rhythm with a normal S1, S2.  He does have a 2/6 systolic ejection murmur. Abdomen:  Soft with good bowel sounds.  There is no fluid wave.  There is no palpable hepatosplenomegaly.  Back:  No tenderness of the spine, ribs, or hips.  Extremities:  No clubbing, cyanosis or edema.  Skin:  No rashes.  LABORATORY STUDIES:  White cell count is 6.6, hemoglobin 13.5, platelet count 172.  IMPRESSION:  Mr. Ambrosio is a 66 year old gentleman with cold agglutinin disease.  He is holding very stable.  With the warm weather, it is no surprise that his blood count is actually a little bit better.  I think back we can probably get him back in 6 months' time.  I do not think we need any blood work in between visits.    ______________________________ Volanda Napoleon, M.D. PRE/MEDQ  D:  11/21/2011  T:  11/22/2011  Job:  [id number]

## 2011-11-29 LAB — COLD AGGLUTININ TITER: Cold Agglutinin Titer: 1:5120 {titer} — AB

## 2011-12-05 ENCOUNTER — Other Ambulatory Visit: Payer: Self-pay | Admitting: Cardiology

## 2011-12-06 ENCOUNTER — Ambulatory Visit (INDEPENDENT_AMBULATORY_CARE_PROVIDER_SITE_OTHER): Payer: BC Managed Care – PPO | Admitting: *Deleted

## 2011-12-06 DIAGNOSIS — I4891 Unspecified atrial fibrillation: Secondary | ICD-10-CM

## 2011-12-06 DIAGNOSIS — I480 Paroxysmal atrial fibrillation: Secondary | ICD-10-CM

## 2011-12-06 DIAGNOSIS — Z7901 Long term (current) use of anticoagulants: Secondary | ICD-10-CM

## 2012-01-03 ENCOUNTER — Ambulatory Visit (INDEPENDENT_AMBULATORY_CARE_PROVIDER_SITE_OTHER): Payer: BC Managed Care – PPO | Admitting: *Deleted

## 2012-01-03 DIAGNOSIS — I4891 Unspecified atrial fibrillation: Secondary | ICD-10-CM

## 2012-01-03 DIAGNOSIS — I480 Paroxysmal atrial fibrillation: Secondary | ICD-10-CM

## 2012-01-03 DIAGNOSIS — Z7901 Long term (current) use of anticoagulants: Secondary | ICD-10-CM

## 2012-01-10 DIAGNOSIS — R972 Elevated prostate specific antigen [PSA]: Secondary | ICD-10-CM | POA: Insufficient documentation

## 2012-01-10 DIAGNOSIS — G4730 Sleep apnea, unspecified: Secondary | ICD-10-CM | POA: Insufficient documentation

## 2012-01-17 ENCOUNTER — Ambulatory Visit (INDEPENDENT_AMBULATORY_CARE_PROVIDER_SITE_OTHER): Payer: Medicare Other | Admitting: *Deleted

## 2012-01-17 DIAGNOSIS — I4891 Unspecified atrial fibrillation: Secondary | ICD-10-CM

## 2012-01-17 DIAGNOSIS — I480 Paroxysmal atrial fibrillation: Secondary | ICD-10-CM

## 2012-01-17 DIAGNOSIS — Z7901 Long term (current) use of anticoagulants: Secondary | ICD-10-CM

## 2012-02-14 ENCOUNTER — Encounter: Payer: Self-pay | Admitting: Pulmonary Disease

## 2012-02-14 ENCOUNTER — Ambulatory Visit (INDEPENDENT_AMBULATORY_CARE_PROVIDER_SITE_OTHER): Payer: Medicare Other | Admitting: Pulmonary Disease

## 2012-02-14 ENCOUNTER — Ambulatory Visit (INDEPENDENT_AMBULATORY_CARE_PROVIDER_SITE_OTHER): Payer: Medicare Other | Admitting: Pharmacist

## 2012-02-14 VITALS — BP 138/78 | HR 67 | Temp 98.0°F | Ht 73.0 in | Wt 259.6 lb

## 2012-02-14 DIAGNOSIS — G4730 Sleep apnea, unspecified: Secondary | ICD-10-CM

## 2012-02-14 DIAGNOSIS — I4891 Unspecified atrial fibrillation: Secondary | ICD-10-CM

## 2012-02-14 DIAGNOSIS — Z7901 Long term (current) use of anticoagulants: Secondary | ICD-10-CM

## 2012-02-14 DIAGNOSIS — J449 Chronic obstructive pulmonary disease, unspecified: Secondary | ICD-10-CM

## 2012-02-14 DIAGNOSIS — I480 Paroxysmal atrial fibrillation: Secondary | ICD-10-CM

## 2012-02-14 LAB — POCT INR: INR: 2.3

## 2012-02-14 NOTE — Assessment & Plan Note (Signed)
Stable Ct spiriva

## 2012-02-14 NOTE — Assessment & Plan Note (Signed)
8/12 PSG showed severe obstructive sleep apnea with AHI 86/h  corrected by CPAP 9 cm 11/13 >> excellent compliance on downloads  Weight loss encouraged, compliance with goal of at least 6 hrs every night is the expectation. Advised against medications with sedative side effects Cautioned against driving when sleepy - understanding that sleepiness will vary on a day to day basis

## 2012-02-14 NOTE — Progress Notes (Signed)
  Subjective:    Patient ID: Steven Shepard, male    DOB: 1945/07/29, 66 y.o.   MRN: [id number]  HPI PCP - Tammy Spears   66/M, ex smoker, realtor with mild COPD & A fibn for FU of obstructive sleep apnea .  He was dx with "borderline" osa approx 5 to 6 when he had a sleep study done at [facility] and was never started on cpap. Pt states his wife tells him he stops breathing when sleeping.  ESS 8. Bedtime 10-11p, bedtime meds include clonidine, lipitor & metoprolol. Latency 10-15 mins, 1-2 awakenings , nocturia +, sleeps on his side with 1 pillow, loud snoring & apneas witnessed by wife. He was hospitalised for hip replacement & needed oxygen during sleep. He has gained 30 lbs over the last 2 yrs.  PSG showed severe obstructive sleep apnea with AHI 86/h corrected by CPAP 9 cm  Started on CPAP 9cm, small nasal comfort gel mask, heated humidity, C-flex setting of 2 cm -  02/14/2012 downloads sep & oct' 12 >> excellent compliance 7.5 h on 9 cm, no leak  He is tolertaing cpap well, feels more refreshed, no leak, mask ok, pressure ok Download on 9cm 11/13 -no residuals, small leak +, good usage He feels he has received pneumovax    Review of Systems  neg for any significant sore throat, dysphagia, itching, sneezing, nasal congestion or excess/ purulent secretions, fever, chills, sweats, unintended wt loss, pleuritic or exertional cp, hempoptysis, orthopnea pnd or change in chronic leg swelling. Also denies presyncope, palpitations, heartburn, abdominal pain, nausea, vomiting, diarrhea or change in bowel or urinary habits, dysuria,hematuria, rash, arthralgias, visual complaints, headache, numbness weakness or ataxia.     Objective:   Physical Exam  Gen. Pleasant, well-nourished, in no distress ENT - no lesions, no post nasal drip Neck: No JVD, no thyromegaly, no carotid bruits Lungs: no use of accessory muscles, no dullness to percussion, clear without rales or rhonchi  Cardiovascular: Rhythm  regular, heart sounds  normal, no murmurs or gallops, no peripheral edema Musculoskeletal: No deformities, no cyanosis or clubbing        Assessment & Plan:

## 2012-02-14 NOTE — Patient Instructions (Signed)
Stay on CPAP 9 cm

## 2012-02-21 ENCOUNTER — Ambulatory Visit (INDEPENDENT_AMBULATORY_CARE_PROVIDER_SITE_OTHER): Payer: Medicare Other | Admitting: Cardiology

## 2012-02-21 ENCOUNTER — Encounter: Payer: Self-pay | Admitting: Cardiology

## 2012-02-21 VITALS — BP 140/80 | Ht 73.0 in | Wt 251.0 lb

## 2012-02-21 DIAGNOSIS — I480 Paroxysmal atrial fibrillation: Secondary | ICD-10-CM

## 2012-02-21 DIAGNOSIS — Z7901 Long term (current) use of anticoagulants: Secondary | ICD-10-CM

## 2012-02-21 DIAGNOSIS — I4891 Unspecified atrial fibrillation: Secondary | ICD-10-CM

## 2012-02-21 DIAGNOSIS — E785 Hyperlipidemia, unspecified: Secondary | ICD-10-CM

## 2012-02-21 DIAGNOSIS — I10 Essential (primary) hypertension: Secondary | ICD-10-CM

## 2012-02-21 DIAGNOSIS — R609 Edema, unspecified: Secondary | ICD-10-CM

## 2012-02-21 NOTE — Assessment & Plan Note (Signed)
At goal most the time looking through his Journal. Continue current medications per Dr. Modena Morrow.

## 2012-02-21 NOTE — Assessment & Plan Note (Signed)
Stable. Continue anticoagulation and rate control. He appears to be in sinus rhythm today.

## 2012-02-21 NOTE — Patient Instructions (Addendum)
Your physician recommends that you continue on your current medications as directed. Please refer to the Current Medication list given to you today.  Your physician wants you to follow-up in: 1 year with Dr. Verl Blalock. You will receive a reminder letter in the mail two months in advance. If you don't receive a letter, please call our office to schedule the follow-up appointment.

## 2012-02-21 NOTE — Progress Notes (Signed)
HPI Steven Shepard returns today for evaluation and management of his history of atrial fibrillation, anticoagulation, and difficult to control blood pressure. Dr. Modena Morrow a primary care is managing his blood pressure as well as his hyperlipidemia. I reviewed lab work from our office today which demonstrates an LDL 91, total cholesterol 154, and an HDL 38. His triglycerides are normal. Fasting blood sugar was borderline at 100.  He also brings blood pressure readings in today. Some numbers are elevated but he is averaging somewhere around 135-140/80-85. His clonidine was recently doubled by Dr. Modena Morrow.  He denies any palpitations, chest pain, orthopnea, PND and his edema has improved. He is exercising a couple times a week. Flexibility is improved dramatically from his hip procedure.  He also has been diagnosed with cold agglutinins.  Past Medical History  Diagnosis Date  . DOE (dyspnea on exertion)   . Hyperlipidemia   . Hypertension   . Sleep apnea   . History of neck surgery   . Umbilical hernia   . Anemia, hemolytic 01/22/2011  . 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 acetaminophen (TYLENOL) 650 MG CR tablet Take 650 mg by mouth 2 (two) times daily as needed.        Marland Kitchen aspirin 81 MG tablet Take 81 mg by mouth daily.        Marland Kitchen atorvastatin (LIPITOR) 20 MG tablet Take 20 mg by mouth daily.        . cloNIDine (CATAPRES) 0.2 MG tablet Take 0.2 mg by mouth 2 (two) times daily.      Marland Kitchen diltiazem (CARDIZEM CD) 240 MG 24 hr capsule TAKE 1 TABLET BY MOUTH EVERY DAY  90 capsule  3  . folic acid (FOLVITE) 235 MCG tablet Take 400 mcg by mouth 2 (two) times daily.      . methocarbamol (ROBAXIN) 500 MG tablet Take 500 mg by mouth 4 (four) times daily as needed.        . metoprolol (TOPROL-XL) 50 MG 24 hr tablet Take 50 mg by mouth 2 (two) times daily.       . Multiple Vitamin (MULTIVITAMIN) capsule Take 1 capsule by mouth daily.         . ranitidine (ZANTAC) 75 MG tablet Take 75 mg by mouth 2 (two) times daily as needed.      . Tamsulosin HCl (FLOMAX) 0.4 MG CAPS Take 0.4 mg by mouth daily.        Marland Kitchen tiotropium (SPIRIVA) 18 MCG inhalation capsule Place 18 mcg into inhaler and inhale daily.        Marland Kitchen warfarin (COUMADIN) 5 MG tablet Take by mouth as directed. 2.5 mg. every day but Thursday and Saturday is 5 mg.        Allergies  Allergen Reactions  . Ace Inhibitors Other (See Comments)  . Codeine   . Spironolactone     Increases potassium    Family History  Problem Relation Age of Onset  . Coronary artery disease Mother   . Coronary artery disease Father     History   Social History  . Marital Status: Married    Spouse Name: N/A    Number of Children: N/A  . Years of Education: N/A   Occupational History  . Realtor    Social History Main Topics  . Smoking status: Former Smoker -- 1.5 packs/day for 3 years    Types: Cigarettes, Pipe    Quit date: 03/12/1974  .  Smokeless tobacco: Never Used     Comment: quit appox 36 years ago  . Alcohol Use: Yes     Comment: occasionally  . Drug Use: No  . Sexually Active: Not on file   Other Topics Concern  . Not on file   Social History Narrative  . No narrative on file    ROS ALL NEGATIVE EXCEPT THOSE NOTED IN HPI  PE  General Appearance: well developed, well nourished in no acute distress, overweight HEENT: symmetrical face, PERRLA, good dentition  Neck: no JVD, thyromegaly, or adenopathy, trachea midline Chest: symmetric without deformity Cardiac: PMI non-displaced, RRR, normal S1, S2, no gallop or murmur Lung: clear to ausculation and percussion Vascular: all pulses full without bruits  Abdominal: nondistended, nontender, good bowel sounds, no HSM, no bruits Extremities: no cyanosis, clubbing or edema, no sign of DVT, no varicosities  Skin: normal color, no rashes Neuro: alert and oriented x 3, non-focal Pysch: normal affect  EKG  BMET     Component Value Date/Time   NA 139 01/23/2011 0821   K 4.2 01/23/2011 0821   CL 103 01/23/2011 0821   CO2 25 01/23/2011 0821   GLUCOSE 97 01/23/2011 0821   BUN 17 01/23/2011 0821   CREATININE 1.08 01/23/2011 0821   CALCIUM 9.6 01/23/2011 0821   GFRNONAA >60 09/20/2010 0443   GFRAA >60 09/20/2010 0443    Lipid Panel  No results found for this basename: chol, trig, hdl, cholhdl, vldl, ldlcalc    CBC    Component Value Date/Time   WBC 6.6 11/21/2011 0830   WBC 13.1* 09/21/2010 0442   RBC 2.85* 09/21/2010 0442   HGB 13.5 11/21/2011 0830   HGB 8.6* 09/21/2010 0442   HCT Unable to Determine 11/21/2011 0830   HCT 26.0* 09/21/2010 0442   PLT 172 11/21/2011 0830   PLT 144* 09/21/2010 0442   MCV Unable to Determine 11/21/2011 0830   MCV 91.2 09/21/2010 0442   MCH Unable to Determine 11/21/2011 0830   MCH 30.2 09/21/2010 0442   MCHC Unable to Determine 11/21/2011 0830   MCHC 33.1 09/21/2010 0442   RDW Cold Agglutinin 11/21/2011 0830   RDW 15.2 09/21/2010 0442   LYMPHSABS 1.9 11/21/2011 0830   LYMPHSABS 1.5 01/04/2010 1533   MONOABS 0.4 01/04/2010 1533   EOSABS 0.1 11/21/2011 0830   EOSABS 0.0 01/04/2010 1533   BASOSABS 0.0 11/21/2011 0830   BASOSABS 0.0 01/04/2010 1533

## 2012-03-14 ENCOUNTER — Other Ambulatory Visit: Payer: Self-pay | Admitting: *Deleted

## 2012-03-14 MED ORDER — WARFARIN SODIUM 5 MG PO TABS: ORAL_TABLET | ORAL | Status: DC

## 2012-03-27 ENCOUNTER — Ambulatory Visit (INDEPENDENT_AMBULATORY_CARE_PROVIDER_SITE_OTHER): Payer: Medicare Other

## 2012-03-27 DIAGNOSIS — I4891 Unspecified atrial fibrillation: Secondary | ICD-10-CM

## 2012-03-27 DIAGNOSIS — Z7901 Long term (current) use of anticoagulants: Secondary | ICD-10-CM

## 2012-03-27 DIAGNOSIS — I480 Paroxysmal atrial fibrillation: Secondary | ICD-10-CM

## 2012-03-27 LAB — POCT INR: INR: 2.8

## 2012-05-06 ENCOUNTER — Telehealth: Payer: Self-pay | Admitting: Hematology & Oncology

## 2012-05-06 NOTE — Telephone Encounter (Signed)
Left pt message moved 3-12 to 4-2

## 2012-05-08 ENCOUNTER — Ambulatory Visit (INDEPENDENT_AMBULATORY_CARE_PROVIDER_SITE_OTHER): Payer: Medicare Other

## 2012-05-08 DIAGNOSIS — Z7901 Long term (current) use of anticoagulants: Secondary | ICD-10-CM

## 2012-05-08 DIAGNOSIS — I4891 Unspecified atrial fibrillation: Secondary | ICD-10-CM

## 2012-05-08 DIAGNOSIS — I480 Paroxysmal atrial fibrillation: Secondary | ICD-10-CM

## 2012-05-08 LAB — POCT INR: INR: 2.2

## 2012-05-21 ENCOUNTER — Other Ambulatory Visit: Payer: BC Managed Care – PPO | Admitting: Lab

## 2012-05-21 ENCOUNTER — Ambulatory Visit: Payer: BC Managed Care – PPO | Admitting: Hematology & Oncology

## 2012-06-11 ENCOUNTER — Other Ambulatory Visit: Payer: Self-pay | Admitting: Lab

## 2012-06-11 ENCOUNTER — Other Ambulatory Visit (HOSPITAL_BASED_OUTPATIENT_CLINIC_OR_DEPARTMENT_OTHER): Payer: Medicare Other | Admitting: Lab

## 2012-06-11 ENCOUNTER — Ambulatory Visit (HOSPITAL_BASED_OUTPATIENT_CLINIC_OR_DEPARTMENT_OTHER): Payer: Medicare Other | Admitting: Hematology & Oncology

## 2012-06-11 ENCOUNTER — Ambulatory Visit: Payer: Self-pay | Admitting: Hematology & Oncology

## 2012-06-11 VITALS — BP 158/84 | HR 64 | Temp 97.7°F | Resp 18 | Ht 73.0 in | Wt 254.0 lb

## 2012-06-11 DIAGNOSIS — D591 Autoimmune hemolytic anemia, unspecified: Secondary | ICD-10-CM

## 2012-06-11 DIAGNOSIS — D5912 Cold autoimmune hemolytic anemia: Secondary | ICD-10-CM

## 2012-06-11 DIAGNOSIS — D589 Hereditary hemolytic anemia, unspecified: Secondary | ICD-10-CM

## 2012-06-11 LAB — RETICULOCYTES (CHCC)
RBC.: 4.8 MIL/uL (ref 4.22–5.81)
Retic Ct Pct: 3.1 % — ABNORMAL HIGH (ref 0.4–2.3)

## 2012-06-11 LAB — CBC WITH DIFFERENTIAL (CANCER CENTER ONLY)
BASO%: 0.7 % (ref 0.0–2.0)
EOS%: 1.2 % (ref 0.0–7.0)
HGB: 13.1 g/dL (ref 13.0–17.1)
LYMPH#: 1.8 10*3/uL (ref 0.9–3.3)
MCHC: UNDETERMINED g/dL (ref 32.0–35.9)
MONO%: 8.5 % (ref 0.0–13.0)
NEUT#: 4.7 10*3/uL (ref 1.5–6.5)
Platelets: 175 10*3/uL (ref 145–400)

## 2012-06-11 LAB — CHCC SATELLITE - SMEAR

## 2012-06-11 NOTE — Progress Notes (Signed)
This office note has been dictated.

## 2012-06-12 NOTE — Progress Notes (Signed)
CC:   Tammy R. Modena Morrow, M.D. Mete C. Verl Blalock, MD, [facility] [place] Harry. Joya Gaskins, MD, FCCP  DIAGNOSES:  Cold agglutinin disease.  CURRENT THERAPY:  Folic acid 1 mg p.o. daily.  INTERIM HISTORY:  [name] comes in for his 56-monthfollowup.  He is doing well.  He got through the wintertime okay.  He did not notice any problems with pain in his hands or feet.  He dressed very warmly.  He has had no fatigue.  He is still working without any difficulties.  He does continue on Coumadin.  He has had no problems with the Coumadin. He is also on low-dose aspirin.  PHYSICAL EXAMINATION:  General:  This is a well-developed, well- nourished white gentleman in no obvious distress.  Vital signs: Temperature of 97.7, pulse 54, respiratory rate 18, blood pressure 158/84.  Weight is 254.  Head and neck:  Normocephalic, atraumatic skull.  There are no ocular or oral lesions.  There are no palpable cervical or supraclavicular lymph nodes.  Lungs:  Clear bilaterally. Cardiac:  Regular rate and rhythm with a normal S1 and S2.  There are no murmurs, rubs, or bruits.  Abdomen:  Soft with good bowel sounds.  There is no palpable abdominal mass.  There is no fluid wave.  No palpable hepatosplenomegaly.  Extremities:  No clubbing, cyanosis, or edema. Neurological:  No focal neurological deficit.  Skin:  No rashes, ecchymoses, or petechiae.  LABORATORY STUDIES:  White cell count is 6.1, hemoglobin 13.1, and platelet count 150.  IMPRESSION:  [name] a 67year old gentleman with cold agglutinin disease.  He actually has done well with this.  He is only on folic acid.  For now, we will just follow him along every 6 months.  Now with the warmer weather coming, his blood count should continue to stabilize, or even improve.  When we last saw him in September, his cold agglutinin titer was 1:5120.    ______________________________ PVolanda Napoleon M.D. PRE/MEDQ  D:  06/11/2012  T:  06/12/2012  Job:   [id number]

## 2012-06-17 ENCOUNTER — Ambulatory Visit (INDEPENDENT_AMBULATORY_CARE_PROVIDER_SITE_OTHER): Payer: Medicare Other | Admitting: Cardiology

## 2012-06-17 ENCOUNTER — Ambulatory Visit (INDEPENDENT_AMBULATORY_CARE_PROVIDER_SITE_OTHER): Payer: Medicare Other | Admitting: *Deleted

## 2012-06-17 ENCOUNTER — Encounter: Payer: Self-pay | Admitting: Cardiology

## 2012-06-17 VITALS — BP 170/90 | HR 80 | Ht 73.0 in | Wt 258.0 lb

## 2012-06-17 DIAGNOSIS — Z7901 Long term (current) use of anticoagulants: Secondary | ICD-10-CM

## 2012-06-17 DIAGNOSIS — I480 Paroxysmal atrial fibrillation: Secondary | ICD-10-CM

## 2012-06-17 DIAGNOSIS — I358 Other nonrheumatic aortic valve disorders: Secondary | ICD-10-CM | POA: Insufficient documentation

## 2012-06-17 DIAGNOSIS — I10 Essential (primary) hypertension: Secondary | ICD-10-CM

## 2012-06-17 DIAGNOSIS — I4891 Unspecified atrial fibrillation: Secondary | ICD-10-CM

## 2012-06-17 DIAGNOSIS — R011 Cardiac murmur, unspecified: Secondary | ICD-10-CM

## 2012-06-17 LAB — POCT INR: INR: 3.1

## 2012-06-17 MED ORDER — DILTIAZEM HCL ER COATED BEADS 360 MG PO CP24: 360.0000 mg | ORAL_CAPSULE | Freq: Every day | ORAL | Status: DC

## 2012-06-17 MED ORDER — CLONIDINE HCL 0.3 MG PO TABS: 0.3000 mg | ORAL_TABLET | Freq: Two times a day (BID) | ORAL | Status: DC

## 2012-06-17 NOTE — Progress Notes (Signed)
HPI Mr Steven Shepard turns today for his evaluation and management of paroxysmal A. fib and difficult to control hypertension. He also is hyperlipidemia which is being followed by Dr.Spear.  He denies any palpitations or symptoms of recurrent A. fib. He does not check his blood pressure at home. He is working out in [employer] 2 days a week. Weight has been stable but he has not lost any. He's very compliant with his medications.  Past Medical History  Diagnosis Date  . DOE (dyspnea on exertion)   . Hyperlipidemia   . Hypertension   . Sleep apnea   . History of neck surgery   . Umbilical hernia   . Anemia, hemolytic 01/22/2011  . 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 acetaminophen (TYLENOL) 650 MG CR tablet Take 650 mg by mouth 2 (two) times daily as needed.        Marland Kitchen aspirin 81 MG tablet Take 81 mg by mouth daily.        Marland Kitchen atorvastatin (LIPITOR) 20 MG tablet Take 20 mg by mouth daily.        . cloNIDine (CATAPRES) 0.3 MG tablet Take 1 tablet (0.3 mg total) by mouth 2 (two) times daily.  180 tablet  3  . diltiazem (CARDIZEM CD) 360 MG 24 hr capsule Take 1 capsule (360 mg total) by mouth daily.  90 capsule  3  . folic acid (FOLVITE) 830 MCG tablet Take 400 mcg by mouth 2 (two) times daily.      . metoprolol (TOPROL-XL) 50 MG 24 hr tablet Take 50 mg by mouth 2 (two) times daily.       . Multiple Vitamin (MULTIVITAMIN) capsule Take 1 capsule by mouth daily.        . ranitidine (ZANTAC) 75 MG tablet Take 75 mg by mouth 2 (two) times daily as needed.      . Tamsulosin HCl (FLOMAX) 0.4 MG CAPS Take 0.4 mg by mouth daily.        Marland Kitchen tiotropium (SPIRIVA) 18 MCG inhalation capsule Place 18 mcg into inhaler and inhale daily.        Marland Kitchen warfarin (COUMADIN) 5 MG tablet Take as directed by coumadin clinic  30 tablet  3   No current facility-administered medications for this visit.    Allergies  Allergen Reactions  . Ace Inhibitors Other  (See Comments)  . Codeine   . Spironolactone     Increases potassium    Family History  Problem Relation Age of Onset  . Coronary artery disease Mother   . Coronary artery disease Father     History   Social History  . Marital Status: Married    Spouse Name: N/A    Number of Children: N/A  . Years of Education: N/A   Occupational History  . Realtor    Social History Main Topics  . Smoking status: Former Smoker -- 1.50 packs/day for 3 years    Types: Cigarettes, Pipe    Quit date: 03/12/1974  . Smokeless tobacco: Never Used     Comment: quit appox 36 years ago  . Alcohol Use: Yes     Comment: occasionally  . Drug Use: No  . Sexually Active: Not on file   Other Topics Concern  . Not on file   Social History Narrative  . No narrative on file    ROS ALL NEGATIVE EXCEPT THOSE NOTED IN HPI  PE  General Appearance: well  developed, well nourished in no acute distress, muscular, overweight HEENT: symmetrical face, PERRLA, good dentition  Neck: no JVD, thyromegaly, or adenopathy, trachea midline Chest: symmetric without deformity Cardiac: PMI non-displaced, RRR, normal S1, S2, soft systolic murmur left upper sternal border with normal splitting of S2, no diastolic component Lung: clear to ausculation and percussion Vascular: all pulses full without bruits  Abdominal: nondistended, nontender, good bowel sounds, no HSM, no bruits Extremities: no cyanosis, clubbing or edema, no sign of DVT, no varicosities  Skin: normal color, no rashes Neuro: alert and oriented x 3, non-focal Pysch: normal affect  EKG Normal sinus rhythm, no acute change.  BMET    Component Value Date/Time   NA 139 01/23/2011 0821   K 4.2 01/23/2011 0821   CL 103 01/23/2011 0821   CO2 25 01/23/2011 0821   GLUCOSE 97 01/23/2011 0821   BUN 17 01/23/2011 0821   CREATININE 1.08 01/23/2011 0821   CALCIUM 9.6 01/23/2011 0821   GFRNONAA >60 09/20/2010 0443   GFRAA >60 09/20/2010 0443    Lipid  Panel  No results found for this basename: chol, trig, hdl, cholhdl, vldl, ldlcalc    CBC    Component Value Date/Time   WBC 7.2 06/11/2012 0825   WBC 13.1* 09/21/2010 0442   RBC 2.85* 09/21/2010 0442   HGB 13.1 06/11/2012 0825   HGB 8.6* 09/21/2010 0442   HCT Unable to Determine 06/11/2012 0825   HCT 26.0* 09/21/2010 0442   PLT 175 06/11/2012 0825   PLT 144* 09/21/2010 0442   MCV Unable to Determine 06/11/2012 0825   MCV 91.2 09/21/2010 0442   MCH Unable to Determine 06/11/2012 0825   MCH 30.2 09/21/2010 0442   MCHC Unable to Determine 06/11/2012 0825   MCHC 33.1 09/21/2010 0442   RDW Cold Agglutinin 06/11/2012 0825   RDW 15.2 09/21/2010 0442   LYMPHSABS 1.8 06/11/2012 0825   LYMPHSABS 1.5 01/04/2010 1533   MONOABS 0.4 01/04/2010 1533   EOSABS 0.1 06/11/2012 0825   EOSABS 0.0 01/04/2010 1533   BASOSABS 0.1 06/11/2012 0825   BASOSABS 0.0 01/04/2010 1533

## 2012-06-17 NOTE — Assessment & Plan Note (Signed)
Not well-controlled. Will increase clonidine to 0.3 twice a day and increase diltiazem 360 mg daily. Systolic blood pressure goal less than 140.

## 2012-06-17 NOTE — Patient Instructions (Addendum)
Increase Diltiazem to 319m daily.  Change Clonidine dose to 0.3104mtwice daily.  Your physician wants you to follow-up in: 1 year with Dr. WaVerl Blalock You will receive a reminder letter in the mail two months in advance. If you don't receive a letter, please call our office to schedule the follow-up appointment.

## 2012-06-17 NOTE — Assessment & Plan Note (Signed)
In sinus rhythm today. Continue better blood pressure control as well as anticoagulation.

## 2012-07-29 ENCOUNTER — Ambulatory Visit (INDEPENDENT_AMBULATORY_CARE_PROVIDER_SITE_OTHER): Payer: Medicare Other

## 2012-07-29 DIAGNOSIS — Z7901 Long term (current) use of anticoagulants: Secondary | ICD-10-CM

## 2012-07-29 DIAGNOSIS — I480 Paroxysmal atrial fibrillation: Secondary | ICD-10-CM

## 2012-07-29 DIAGNOSIS — I4891 Unspecified atrial fibrillation: Secondary | ICD-10-CM

## 2012-07-29 LAB — POCT INR: INR: 2.7

## 2012-08-28 ENCOUNTER — Telehealth: Payer: Self-pay | Admitting: Pharmacist

## 2012-08-28 NOTE — Telephone Encounter (Signed)
New problem   Pt wants to move appt out for 1 wk

## 2012-08-28 NOTE — Telephone Encounter (Signed)
Moved patients coumadin appt back 1 week.

## 2012-09-02 ENCOUNTER — Ambulatory Visit (INDEPENDENT_AMBULATORY_CARE_PROVIDER_SITE_OTHER): Payer: Medicare Other | Admitting: *Deleted

## 2012-09-02 DIAGNOSIS — I4891 Unspecified atrial fibrillation: Secondary | ICD-10-CM

## 2012-09-02 DIAGNOSIS — I480 Paroxysmal atrial fibrillation: Secondary | ICD-10-CM

## 2012-09-02 DIAGNOSIS — Z7901 Long term (current) use of anticoagulants: Secondary | ICD-10-CM

## 2012-09-02 LAB — POCT INR: INR: 3.3

## 2012-09-03 ENCOUNTER — Other Ambulatory Visit: Payer: Self-pay | Admitting: Cardiology

## 2012-10-07 ENCOUNTER — Ambulatory Visit (INDEPENDENT_AMBULATORY_CARE_PROVIDER_SITE_OTHER): Payer: Medicare Other | Admitting: *Deleted

## 2012-10-07 DIAGNOSIS — I4891 Unspecified atrial fibrillation: Secondary | ICD-10-CM

## 2012-10-07 DIAGNOSIS — Z7901 Long term (current) use of anticoagulants: Secondary | ICD-10-CM

## 2012-10-07 DIAGNOSIS — I480 Paroxysmal atrial fibrillation: Secondary | ICD-10-CM

## 2012-10-21 ENCOUNTER — Ambulatory Visit (INDEPENDENT_AMBULATORY_CARE_PROVIDER_SITE_OTHER): Payer: Medicare Other | Admitting: *Deleted

## 2012-10-21 DIAGNOSIS — Z7901 Long term (current) use of anticoagulants: Secondary | ICD-10-CM

## 2012-10-21 DIAGNOSIS — I480 Paroxysmal atrial fibrillation: Secondary | ICD-10-CM

## 2012-10-21 DIAGNOSIS — I4891 Unspecified atrial fibrillation: Secondary | ICD-10-CM

## 2012-11-13 ENCOUNTER — Ambulatory Visit (INDEPENDENT_AMBULATORY_CARE_PROVIDER_SITE_OTHER): Payer: Medicare Other | Admitting: Pharmacist

## 2012-11-13 DIAGNOSIS — I4891 Unspecified atrial fibrillation: Secondary | ICD-10-CM

## 2012-11-13 DIAGNOSIS — Z7901 Long term (current) use of anticoagulants: Secondary | ICD-10-CM

## 2012-11-13 DIAGNOSIS — I480 Paroxysmal atrial fibrillation: Secondary | ICD-10-CM

## 2012-11-13 LAB — POCT INR: INR: 1.9

## 2012-12-04 ENCOUNTER — Ambulatory Visit (INDEPENDENT_AMBULATORY_CARE_PROVIDER_SITE_OTHER): Payer: Medicare Other | Admitting: *Deleted

## 2012-12-04 DIAGNOSIS — Z7901 Long term (current) use of anticoagulants: Secondary | ICD-10-CM

## 2012-12-04 DIAGNOSIS — I4891 Unspecified atrial fibrillation: Secondary | ICD-10-CM

## 2012-12-04 DIAGNOSIS — I480 Paroxysmal atrial fibrillation: Secondary | ICD-10-CM

## 2012-12-09 ENCOUNTER — Other Ambulatory Visit: Payer: Self-pay | Admitting: Gastroenterology

## 2012-12-09 DIAGNOSIS — R748 Abnormal levels of other serum enzymes: Secondary | ICD-10-CM

## 2012-12-09 DIAGNOSIS — K824 Cholesterolosis of gallbladder: Secondary | ICD-10-CM

## 2012-12-10 ENCOUNTER — Ambulatory Visit (HOSPITAL_BASED_OUTPATIENT_CLINIC_OR_DEPARTMENT_OTHER): Payer: Medicare Other | Admitting: Hematology & Oncology

## 2012-12-10 ENCOUNTER — Other Ambulatory Visit (HOSPITAL_BASED_OUTPATIENT_CLINIC_OR_DEPARTMENT_OTHER): Payer: Medicare Other | Admitting: Lab

## 2012-12-10 VITALS — BP 147/73 | HR 61 | Temp 97.7°F | Resp 18 | Ht 73.0 in | Wt 257.0 lb

## 2012-12-10 DIAGNOSIS — D5912 Cold autoimmune hemolytic anemia: Secondary | ICD-10-CM

## 2012-12-10 DIAGNOSIS — D591 Autoimmune hemolytic anemia, unspecified: Secondary | ICD-10-CM

## 2012-12-10 LAB — CBC WITH DIFFERENTIAL (CANCER CENTER ONLY)
BASO#: 0 10*3/uL (ref 0.0–0.2)
EOS%: 1.5 % (ref 0.0–7.0)
HGB: 13.3 g/dL (ref 13.0–17.1)
MCH: UNDETERMINED pg (ref 28.0–33.4)
MCHC: UNDETERMINED g/dL (ref 32.0–35.9)
MONO%: 8.8 % (ref 0.0–13.0)
NEUT#: 4.2 10*3/uL (ref 1.5–6.5)
Platelets: 177 10*3/uL (ref 145–400)

## 2012-12-11 NOTE — Progress Notes (Signed)
This office note has been dictated.

## 2012-12-12 NOTE — Progress Notes (Signed)
CC:   Tammy R. Modena Morrow, M.D.  DIAGNOSIS:  Cold agglutinin disease.  CURRENT THERAPY:  Folic acid 1 mg p.o. q. day.  INTERIM HISTORY:  Mr. Batten comes in for followup.  We last saw him back in April.  Typically, with his cold agglutinin disease, his hemoglobin tends to get better in the summertime.  He has had no problems over the summertime.  He has had no fatigue or weakness.  He is still working.  He wants to try to retire, but is not sure when he can do this.  He has had no joint aches or pains.  He has had no pain in the hands or feet.  There has been no change in bowel or bladder habits.  He has had no cough.  There has been no nausea or vomiting.  He has had no skin rashes.  He has had no headache.  There has been no double vision or blurred vision.  PHYSICAL EXAMINATION:  General:  This is a well-developed, well- nourished white gentleman in no obvious distress.  Vital signs: Temperature of 97.7, pulse 61, respiratory rate 18, blood pressure 147/73.  Weight is 257 pounds.  Head and neck:  Normocephalic, atraumatic skull.  There are no ocular or oral lesions.  There are no palpable cervical or supraclavicular lymph nodes.  Lungs:  Clear to percussion and auscultation bilaterally.  Cardiac:  Regular rate and rhythm with a normal S1 and S2.  There are no murmurs, rubs, or bruits. Abdomen:  Soft.  He has good bowel sounds.  There is no palpable abdominal mass.  There is no fluid wave.  There is no palpable hepatosplenomegaly.  Back:  No tenderness over the spine, ribs, or hips. Extremities:  No clubbing, cyanosis, or edema.  LABORATORY STUDIES:  White cell count is 7.4, hemoglobin 13.3, and platelet count 177.  IMPRESSION:  Mr. Gianino is a 67 year old gentleman with cold agglutinin disease.  He has been asymptomatic with this.  He said he has cold agglutinins, but they really have not caused him problems, to-date.  He continues on folic acid.  We will plan to see  him back in 6 more months.  He knows that he needs to make sure to wear gloves and a hat if it gets below 40 degrees.  I told him that he could come back to see [place] before 6 months if he feels that he needs to have any blood work done.    ______________________________ Volanda Napoleon, M.D. PRE/MEDQ  D:  12/11/2012  T:  12/12/2012  Job:  [id number]

## 2012-12-16 ENCOUNTER — Ambulatory Visit
Admission: RE | Admit: 2012-12-16 | Discharge: 2012-12-16 | Disposition: A | Discharge status: Home / Self Care | Payer: Medicare Other | Source: Ambulatory Visit | Attending: Gastroenterology | Admitting: Gastroenterology

## 2012-12-16 DIAGNOSIS — K824 Cholesterolosis of gallbladder: Secondary | ICD-10-CM

## 2012-12-16 DIAGNOSIS — R748 Abnormal levels of other serum enzymes: Secondary | ICD-10-CM

## 2012-12-17 LAB — COLD AGGLUTININ TITER: Cold Agglutinin Titer: 1:2560 {titer} — AB

## 2013-01-01 ENCOUNTER — Ambulatory Visit (INDEPENDENT_AMBULATORY_CARE_PROVIDER_SITE_OTHER): Payer: Medicare Other | Admitting: General Practice

## 2013-01-01 DIAGNOSIS — Z7901 Long term (current) use of anticoagulants: Secondary | ICD-10-CM

## 2013-01-01 DIAGNOSIS — I480 Paroxysmal atrial fibrillation: Secondary | ICD-10-CM

## 2013-01-01 DIAGNOSIS — I4891 Unspecified atrial fibrillation: Secondary | ICD-10-CM

## 2013-01-01 LAB — POCT INR: INR: 3

## 2013-01-09 DIAGNOSIS — D891 Cryoglobulinemia: Secondary | ICD-10-CM | POA: Insufficient documentation

## 2013-01-29 ENCOUNTER — Ambulatory Visit (INDEPENDENT_AMBULATORY_CARE_PROVIDER_SITE_OTHER): Payer: Medicare Other | Admitting: Pharmacist

## 2013-01-29 ENCOUNTER — Encounter (INDEPENDENT_AMBULATORY_CARE_PROVIDER_SITE_OTHER): Payer: Self-pay

## 2013-01-29 DIAGNOSIS — I4891 Unspecified atrial fibrillation: Secondary | ICD-10-CM

## 2013-01-29 DIAGNOSIS — Z7901 Long term (current) use of anticoagulants: Secondary | ICD-10-CM

## 2013-01-29 DIAGNOSIS — I480 Paroxysmal atrial fibrillation: Secondary | ICD-10-CM

## 2013-01-29 LAB — POCT INR: INR: 2.4

## 2013-02-02 ENCOUNTER — Other Ambulatory Visit: Payer: Self-pay | Admitting: *Deleted

## 2013-02-02 MED ORDER — WARFARIN SODIUM 5 MG PO TABS: ORAL_TABLET | ORAL | Status: DC

## 2013-02-24 ENCOUNTER — Encounter: Payer: Self-pay | Admitting: Cardiology

## 2013-02-24 ENCOUNTER — Ambulatory Visit (INDEPENDENT_AMBULATORY_CARE_PROVIDER_SITE_OTHER): Payer: Medicare Other | Admitting: Cardiology

## 2013-02-24 VITALS — BP 144/82 | HR 60 | Ht 72.0 in | Wt 254.0 lb

## 2013-02-24 DIAGNOSIS — I4891 Unspecified atrial fibrillation: Secondary | ICD-10-CM

## 2013-02-24 DIAGNOSIS — I480 Paroxysmal atrial fibrillation: Secondary | ICD-10-CM

## 2013-02-24 DIAGNOSIS — I10 Essential (primary) hypertension: Secondary | ICD-10-CM

## 2013-02-24 DIAGNOSIS — E785 Hyperlipidemia, unspecified: Secondary | ICD-10-CM

## 2013-02-24 LAB — COMPREHENSIVE METABOLIC PANEL
ALT: 25 U/L (ref 0–53)
AST: 24 U/L (ref 0–37)
Albumin: 4.1 g/dL (ref 3.5–5.2)
Alkaline Phosphatase: 71 U/L (ref 39–117)
BUN: 13 mg/dL (ref 6–23)
CO2: 28 mEq/L (ref 19–32)
Calcium: 8.9 mg/dL (ref 8.4–10.5)
Chloride: 101 mEq/L (ref 96–112)
Creatinine, Ser: 1.1 mg/dL (ref 0.4–1.5)
GFR: 74.28 mL/min (ref >60.00)
Glucose, Bld: 111 mg/dL — ABNORMAL HIGH (ref 70–99)
Potassium: 4.1 mEq/L (ref 3.5–5.1)
Sodium: 135 mEq/L (ref 135–145)
Total Bilirubin: 1.5 mg/dL — ABNORMAL HIGH (ref 0.3–1.2)
Total Protein: 6.7 g/dL (ref 6.0–8.3)

## 2013-02-24 NOTE — Patient Instructions (Signed)
Lab work ( cmp ) today     Your physician wants you to follow-up in: 6 months. You will receive a reminder letter in the mail two months in advance. If you don't receive a letter, please call our office to schedule the follow-up appointment.

## 2013-02-24 NOTE — Progress Notes (Signed)
Patient ID: Steven Shepard, male   DOB: 01/10/1946, 67 y.o.   MRN: [id number]     Patient Name: Steven Shepard Date of Encounter: 02/24/2013  Primary Care Provider:  Florina Ou, MD Primary Cardiologist:  Ena Dawley, H   Problem List   Past Medical History  Diagnosis Date  . DOE (dyspnea on exertion)   . Hyperlipidemia   . Hypertension   . Sleep apnea   . History of neck surgery   . Umbilical hernia   . Anemia, hemolytic 01/22/2011  . 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 Tonsillectomy    . Skin surgery      skin cancer  . Neck surgery  2005  . Umbilical hernia repair    . Total hip arthroplasty  September 18, 2010    right Dr. Maureen Ralphs    Allergies  Allergies  Allergen Reactions  . Ace Inhibitors Other (See Comments)  . Codeine   . Spironolactone     Increases potassium    HPI Steven Shepard turns today for his evaluation and management of paroxysmal A. fib and difficult to control hypertension. He also is hyperlipidemia which is being followed by Dr.Spear.  He states that he has palpitations especially after few glasses of wine but not more than once every couple of months. He does check his blood pressure at home and is normal. He is working out in the gym 2 days a week. Weight has been stable but he has not lost any. He's very compliant with his medications. No chest pain or SOB, but fatigue with exercise. Muscle pain (harmstring), worse at rest.  ROS ALL NEGATIVE EXCEPT THOSE NOTED IN HPI  PE  General Appearance: well developed, well nourished in no acute distress, muscular, overweight HEENT: symmetrical face, PERRLA, good dentition  Neck: no JVD, thyromegaly, or adenopathy, trachea midline Chest: symmetric without deformity Cardiac: PMI non-displaced, RRR, normal S1, S2, soft systolic murmur left upper sternal border with normal splitting of S2, no diastolic component Lung: clear to ausculation and  percussion Vascular: all pulses full without bruits  Abdominal: nondistended, nontender, good bowel sounds, no HSM, no bruits Extremities: no cyanosis, clubbing or edema, no sign of DVT, no varicosities  Skin: normal color, no rashes Neuro: alert and oriented x 3, non-focal Pysch: normal affect  EKG Normal sinus rhythm, no acute change.   Home Medications  Prior to Admission medications   Medication Sig Start Date End Date Taking? Authorizing Provider  acetaminophen (TYLENOL) 650 MG CR tablet Take 650 mg by mouth 2 (two) times daily as needed.     Yes Historical Provider, MD  aspirin 81 MG tablet Take 81 mg by mouth daily.     Yes Historical Provider, MD  atorvastatin (LIPITOR) 20 MG tablet Take 20 mg by mouth daily.     Yes Historical Provider, MD  cloNIDine (CATAPRES) 0.3 MG tablet Take 1 tablet (0.3 mg total) by mouth 2 (two) times daily. 06/17/12  Yes Renella Cunas, MD  diltiazem (CARDIZEM CD) 360 MG 24 hr capsule Take 1 capsule (360 mg total) by mouth daily. 06/17/12  Yes Renella Cunas, MD  Fexofenadine HCl (ALLEGRA PO) Take by mouth every morning.   Yes Historical Provider, MD  folic acid (FOLVITE) 235 MCG tablet Take 400 mcg by mouth 2 (two) times daily.   Yes Historical Provider, MD  metoprolol (TOPROL-XL) 50 MG 24 hr tablet Take 50 mg by mouth 2 (two) times daily.  Yes Historical Provider, MD  Multiple Vitamin (MULTIVITAMIN) capsule Take 1 capsule by mouth daily.     Yes Historical Provider, MD  ranitidine (ZANTAC) 75 MG tablet Take 75 mg by mouth 2 (two) times daily as needed.   Yes Historical Provider, MD  Tamsulosin HCl (FLOMAX) 0.4 MG CAPS Take 0.4 mg by mouth daily.     Yes Historical Provider, MD  tiotropium (SPIRIVA) 18 MCG inhalation capsule Place 18 mcg into inhaler and inhale daily.     Yes Historical Provider, MD  warfarin (COUMADIN) 5 MG tablet TAKE AS DIRECTED BY COUMADIN CLINIC 02/02/13  Yes Dorothy Spark, MD    Family History  Family History  Problem  Relation Age of Onset  . Coronary artery disease Mother   . Coronary artery disease Father     Social History  History   Social History  . Marital Status: Married    Spouse Name: N/A    Number of Children: N/A  . Years of Education: N/A   Occupational History  . Realtor    Social History Main Topics  . Smoking status: Former Smoker -- 1.50 packs/day for 3 years    Types: Cigarettes, Pipe    Quit date: 03/12/1974  . Smokeless tobacco: Never Used     Comment: quit appox 36 years ago  . Alcohol Use: Yes     Comment: occasionally  . Drug Use: No  . Sexual Activity: Not on file   Other Topics Concern  . Not on file   Social History Narrative  . No narrative on file     Review of Systems, as per HPI, otherwise negative General:  No chills, fever, night sweats or weight changes.  Cardiovascular:  No chest pain, dyspnea on exertion, edema, orthopnea, palpitations, paroxysmal nocturnal dyspnea. Dermatological: No rash, lesions/masses Respiratory: No cough, dyspnea Urologic: No hematuria, dysuria Abdominal:   No nausea, vomiting, diarrhea, bright red blood per rectum, melena, or hematemesis Neurologic:  No visual changes, wkns, changes in mental status. All other systems reviewed and are otherwise negative except as noted above.  Physical Exam  Blood pressure 144/82, pulse 60, height 6' (1.829 m), weight 254 lb (115.214 kg).  General: Pleasant, NAD Psych: Normal affect. Neuro: Alert and oriented X 3. Moves all extremities spontaneously. HEENT: Normal  Neck: Supple without bruits or JVD. Lungs:  Resp regular and unlabored, CTA. Heart: RRR no s3, s4, or murmurs. Abdomen: Soft, non-tender, non-distended, BS + x 4.  Extremities: No clubbing, cyanosis or edema. DP/PT/Radials 2+ and equal bilaterally.  Labs:  No results found for this basename: CKTOTAL, CKMB, TROPONINI,  in the last 72 hours Lab Results  Component Value Date   WBC 7.4 12/10/2012   HGB 13.3 12/10/2012    HCT Unable to Determine 12/10/2012   MCV Unable to Determine 12/10/2012   PLT 177 12/10/2012   No results found for this basename: NA, K, CL, CO2, BUN, CREATININE, CALCIUM, LABALBU, PROT, BILITOT, ALKPHOS, ALT, AST, GLUCOSE,  in the last 168 hours No results found for this basename: CHOL, HDL, LDLCALC, TRIG   No results found for this basename: DDIMER   No components found with this basename: POCBNP,   Accessory Clinical Findings  echocardiogram  ECG - SR, 1. AVB. RAD, negat T wave in III< and aVF, unchanged from April 2014   Assessment & Plan  67 year old male  1. Hypertension - well controlled, at home 130-140 range in the am, then lower throughout the day, continue the  same regimen  2. Hyperlipidemia - followed by his PCP Dr Greta Doom  3. Lower extremity pain at rest and harmstring pain - doesn't sound like PAD, also good pulses on exam, we will check liver enzymes to evaluate for statin side effects. He was advised to follow with his PCP about potentially switching to a weaker statin, I won't change as I don't know the lipids levels  Follow up in 6 months    Dorothy Spark, MD, [facility] 02/24/2013, 8:51 AM

## 2013-02-26 ENCOUNTER — Ambulatory Visit (INDEPENDENT_AMBULATORY_CARE_PROVIDER_SITE_OTHER): Payer: Medicare Other | Admitting: *Deleted

## 2013-02-26 DIAGNOSIS — I480 Paroxysmal atrial fibrillation: Secondary | ICD-10-CM

## 2013-02-26 DIAGNOSIS — I4891 Unspecified atrial fibrillation: Secondary | ICD-10-CM

## 2013-02-26 DIAGNOSIS — Z7901 Long term (current) use of anticoagulants: Secondary | ICD-10-CM

## 2013-03-17 ENCOUNTER — Other Ambulatory Visit: Payer: Self-pay | Admitting: Cardiology

## 2013-04-07 ENCOUNTER — Ambulatory Visit (INDEPENDENT_AMBULATORY_CARE_PROVIDER_SITE_OTHER): Payer: Medicare Other | Admitting: *Deleted

## 2013-04-07 DIAGNOSIS — Z5181 Encounter for therapeutic drug level monitoring: Secondary | ICD-10-CM

## 2013-04-07 DIAGNOSIS — Z7901 Long term (current) use of anticoagulants: Secondary | ICD-10-CM

## 2013-04-07 DIAGNOSIS — I4891 Unspecified atrial fibrillation: Secondary | ICD-10-CM

## 2013-04-07 DIAGNOSIS — I480 Paroxysmal atrial fibrillation: Secondary | ICD-10-CM

## 2013-04-07 LAB — POCT INR: INR: 3.8

## 2013-04-17 ENCOUNTER — Ambulatory Visit (INDEPENDENT_AMBULATORY_CARE_PROVIDER_SITE_OTHER): Payer: Medicare Other | Admitting: *Deleted

## 2013-04-17 DIAGNOSIS — Z7901 Long term (current) use of anticoagulants: Secondary | ICD-10-CM

## 2013-04-17 DIAGNOSIS — I4891 Unspecified atrial fibrillation: Secondary | ICD-10-CM

## 2013-04-17 DIAGNOSIS — I480 Paroxysmal atrial fibrillation: Secondary | ICD-10-CM

## 2013-04-17 DIAGNOSIS — Z5181 Encounter for therapeutic drug level monitoring: Secondary | ICD-10-CM

## 2013-04-17 LAB — POCT INR: INR: 2.7

## 2013-04-24 ENCOUNTER — Encounter: Payer: Self-pay | Admitting: Pulmonary Disease

## 2013-04-24 ENCOUNTER — Ambulatory Visit (INDEPENDENT_AMBULATORY_CARE_PROVIDER_SITE_OTHER): Payer: Medicare Other | Admitting: Pulmonary Disease

## 2013-04-24 VITALS — BP 158/80 | HR 74 | Temp 98.0°F | Ht 73.0 in | Wt 263.0 lb

## 2013-04-24 DIAGNOSIS — J449 Chronic obstructive pulmonary disease, unspecified: Secondary | ICD-10-CM

## 2013-04-24 DIAGNOSIS — G4730 Sleep apnea, unspecified: Secondary | ICD-10-CM

## 2013-04-24 DIAGNOSIS — L03119 Cellulitis of unspecified part of limb: Secondary | ICD-10-CM | POA: Insufficient documentation

## 2013-04-24 NOTE — Assessment & Plan Note (Signed)
Trial off spiriva Prevnar shot

## 2013-04-24 NOTE — Patient Instructions (Signed)
Your CPAP is set at 9 cm Trial off spiriva Prevnar shot

## 2013-04-24 NOTE — Progress Notes (Signed)
   Subjective:    Patient ID: Steven Shepard, male    DOB: 1945-07-19, 68 y.o.   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  PSG showed severe obstructive sleep apnea with AHI 86/h corrected by CPAP 9 cm  Started on CPAP 9cm, small nasal comfort gel mask, heated humidity, C-flex setting of 2 cm -   downloads sep & oct' 12 >> excellent compliance 7.5 h on 9 cm, no leak   Download on 9cm 11/13 -no residuals, small leak +, good usage   pneumovax 2011  32mDownload 04/2013 - no residuals, , usage >7h, excellent! No leak He is tolerating cpap well, feels more refreshed, no leak, mask ok, pressure ok Remains on spiriva & allegra 2010 Spirometry FEV1 83%, ratio 69    Review of Systems neg for any significant sore throat, dysphagia, itching, sneezing, nasal congestion or excess/ purulent secretions, fever, chills, sweats, unintended wt loss, pleuritic or exertional cp, hempoptysis, orthopnea pnd or change in chronic leg swelling. Also denies presyncope, palpitations, heartburn, abdominal pain, nausea, vomiting, diarrhea or change in bowel or urinary habits, dysuria,hematuria, rash, arthralgias, visual complaints, headache, numbness weakness or ataxia.     Objective:   Physical Exam  Gen. Pleasant, obese, in no distress ENT - no lesions, no post nasal drip Neck: No JVD, no thyromegaly, no carotid bruits Lungs: no use of accessory muscles, no dullness to percussion, decreased without rales or rhonchi  Cardiovascular: Rhythm regular, heart sounds  normal, no murmurs or gallops, no peripheral edema Musculoskeletal: No deformities, no cyanosis or clubbing , no tremors       Assessment & Plan:

## 2013-04-24 NOTE — Assessment & Plan Note (Signed)
Ct cpap 9 cm  Weight loss encouraged, compliance with goal of at least 4-6 hrs every night is the expectation. Advised against medications with sedative side effects Cautioned against driving when sleepy - understanding that sleepiness will vary on a day to day basis

## 2013-05-13 ENCOUNTER — Ambulatory Visit (INDEPENDENT_AMBULATORY_CARE_PROVIDER_SITE_OTHER): Payer: Medicare Other | Admitting: *Deleted

## 2013-05-13 DIAGNOSIS — Z5181 Encounter for therapeutic drug level monitoring: Secondary | ICD-10-CM

## 2013-05-13 DIAGNOSIS — I4891 Unspecified atrial fibrillation: Secondary | ICD-10-CM

## 2013-05-13 DIAGNOSIS — I480 Paroxysmal atrial fibrillation: Secondary | ICD-10-CM

## 2013-05-13 LAB — POCT INR: INR: 4.2

## 2013-05-20 ENCOUNTER — Other Ambulatory Visit: Payer: Self-pay | Admitting: Cardiology

## 2013-05-24 ENCOUNTER — Other Ambulatory Visit: Payer: Self-pay | Admitting: Cardiology

## 2013-05-26 ENCOUNTER — Ambulatory Visit (INDEPENDENT_AMBULATORY_CARE_PROVIDER_SITE_OTHER): Payer: Medicare Other

## 2013-05-26 DIAGNOSIS — I4891 Unspecified atrial fibrillation: Secondary | ICD-10-CM

## 2013-05-26 DIAGNOSIS — Z5181 Encounter for therapeutic drug level monitoring: Secondary | ICD-10-CM

## 2013-05-26 DIAGNOSIS — I480 Paroxysmal atrial fibrillation: Secondary | ICD-10-CM

## 2013-05-26 LAB — POCT INR: INR: 3.7

## 2013-06-09 ENCOUNTER — Ambulatory Visit (INDEPENDENT_AMBULATORY_CARE_PROVIDER_SITE_OTHER): Payer: Medicare Other

## 2013-06-09 DIAGNOSIS — Z5181 Encounter for therapeutic drug level monitoring: Secondary | ICD-10-CM

## 2013-06-09 DIAGNOSIS — I4891 Unspecified atrial fibrillation: Secondary | ICD-10-CM

## 2013-06-09 DIAGNOSIS — I480 Paroxysmal atrial fibrillation: Secondary | ICD-10-CM

## 2013-06-09 LAB — POCT INR: INR: 2.6

## 2013-06-10 ENCOUNTER — Other Ambulatory Visit (HOSPITAL_BASED_OUTPATIENT_CLINIC_OR_DEPARTMENT_OTHER): Payer: Medicare Other | Admitting: Lab

## 2013-06-10 ENCOUNTER — Ambulatory Visit (HOSPITAL_BASED_OUTPATIENT_CLINIC_OR_DEPARTMENT_OTHER): Payer: Medicare Other | Admitting: Hematology & Oncology

## 2013-06-10 VITALS — BP 141/81 | HR 65 | Temp 98.0°F | Resp 20 | Ht 73.0 in | Wt 260.0 lb

## 2013-06-10 DIAGNOSIS — D5912 Cold autoimmune hemolytic anemia: Secondary | ICD-10-CM

## 2013-06-10 DIAGNOSIS — D589 Hereditary hemolytic anemia, unspecified: Secondary | ICD-10-CM

## 2013-06-10 DIAGNOSIS — D591 Autoimmune hemolytic anemia, unspecified: Secondary | ICD-10-CM

## 2013-06-10 LAB — CBC WITH DIFFERENTIAL (CANCER CENTER ONLY)
BASO#: 0 10*3/uL (ref 0.0–0.2)
BASO%: 0.5 % (ref 0.0–2.0)
EOS%: 1.2 % (ref 0.0–7.0)
Eosinophils Absolute: 0.1 10*3/uL (ref 0.0–0.5)
HCT: UNDETERMINED % (ref 38.7–49.9)
HEMOGLOBIN: 12.3 g/dL — AB (ref 13.0–17.1)
LYMPH#: 2.2 10*3/uL (ref 0.9–3.3)
LYMPH%: 28.5 % (ref 14.0–48.0)
MCH: UNDETERMINED pg (ref 28.0–33.4)
MCHC: UNDETERMINED g/dL (ref 32.0–35.9)
MCV: UNDETERMINED fL (ref 82–98)
MONO#: 0.6 10*3/uL (ref 0.1–0.9)
MONO%: 8.4 % (ref 0.0–13.0)
NEUT#: 4.7 10*3/uL (ref 1.5–6.5)
NEUT%: 61.4 % (ref 40.0–80.0)
Platelets: 177 10*3/uL (ref 145–400)
RBC: UNDETERMINED 10*6/uL (ref 4.20–5.70)
WBC: 7.6 10*3/uL (ref 4.0–10.0)

## 2013-06-10 LAB — CHCC SATELLITE - SMEAR

## 2013-06-10 NOTE — Progress Notes (Signed)
Hematology and Oncology Follow Up Visit  Steven Shepard [id number] [birth year] 68 y.o. 06/10/2013   Principle Diagnosis:   Cold agglutinin disease  Current Therapy:    Folic acid 2 mg by mouth daily     Interim History:  Mr.  Shepard is back for followup to be seen her 6 months. He's done well. He had no problems over the wintertime. He makes sure that he stays warm.  He's had no fatigue or weakness. He still working. He should try to retire soon.  He's had no headache. He's had no abdominal pain. There's been no cough or shortness of breath. He's had no change in bowel or bladder habits. There's been no rashes. He's had no palpable lymph glands. He's had no issues with infectious.  He does have atrial fibrillation. He is on Coumadin. He's not had any bleeding from this.  Medications: Current outpatient prescriptions:acetaminophen (TYLENOL) 650 MG CR tablet, Take 650 mg by mouth 2 (two) times daily as needed.  , Disp: , Rfl: ;  aspirin 81 MG tablet, Take 81 mg by mouth daily.  , Disp: , Rfl: ;  atorvastatin (LIPITOR) 20 MG tablet, Take 20 mg by mouth daily.  , Disp: , Rfl: ;  cloNIDine (CATAPRES) 0.3 MG tablet, TAKE 1 TABLET BY MOUTH TWICE A DAY, Disp: 180 tablet, Rfl: 0 DILTIAZEM HCL CD 360 MG 24 hr capsule, TAKE ONE CAPSULE BY MOUTH EVERY DAY, Disp: 90 capsule, Rfl: 0;  Fexofenadine HCl (ALLEGRA PO), Take by mouth every morning., Disp: , Rfl: ;  folic acid (FOLVITE) 809 MCG tablet, Take 400 mcg by mouth 2 (two) times daily., Disp: , Rfl: ;  metoprolol (TOPROL-XL) 50 MG 24 hr tablet, Take 50 mg by mouth 2 (two) times daily. , Disp: , Rfl:  Multiple Vitamin (MULTIVITAMIN) capsule, Take 1 capsule by mouth daily.  , Disp: , Rfl: ;  ranitidine (ZANTAC) 75 MG tablet, Take 75 mg by mouth 2 (two) times daily as needed., Disp: , Rfl: ;  Tamsulosin HCl (FLOMAX) 0.4 MG CAPS, Take 0.4 mg by mouth daily.  , Disp: , Rfl: ;  tiotropium (SPIRIVA) 18 MCG inhalation capsule, Place 18 mcg into inhaler and  inhale daily. , Disp: , Rfl:  warfarin (COUMADIN) 5 MG tablet, TAKE AS DIRECTED BY COUMADIN CLINIC, Disp: 30 tablet, Rfl: 3  Allergies:  Allergies  Allergen Reactions  . Ace Inhibitors Other (See Comments)  . Codeine   . Spironolactone     Increases potassium    Past Medical History, Surgical history, Social history, and Family History were reviewed and updated.  Review of Systems: As above  Physical Exam:  height is 6' 1"  (1.854 m) and weight is 260 lb (117.935 kg). His oral temperature is 98 F (36.7 C). His blood pressure is 141/81 and his pulse is 65. His respiration is 20.   Well-developed and well-nourished gentleman. Head and neck exam shows no adenopathy in the neck. There is no scleral icterus. Lungs are clear. Cardiac exam regular in rhythm. He does have a 2/6 systolic ejection murmur. Abdomen soft. He has good bowel sounds. There is no fluid wave. There is no palpable liver or spleen tip. Extremities shows no clubbing cyanosis or edema. Skin exam no rashes.  Lab Results  Component Value Date   WBC 7.6 06/10/2013   HGB 12.3* 06/10/2013   HCT Unable to Determine 06/10/2013   MCV Unable to Determine 06/10/2013   PLT 177 06/10/2013     Chemistry  Component Value Date/Time   NA 135 02/24/2013 0924   K 4.1 02/24/2013 0924   CL 101 02/24/2013 0924   CO2 28 02/24/2013 0924   BUN 13 02/24/2013 0924   CREATININE 1.1 02/24/2013 0924      Component Value Date/Time   CALCIUM 8.9 02/24/2013 0924   ALKPHOS 71 02/24/2013 0924   AST 24 02/24/2013 0924   ALT 25 02/24/2013 0924   BILITOT 1.5* 02/24/2013 0924         Impression and Plan: Steven Shepard is C59-year-old gentleman with cold agglutinin disease. This is idiopathic. He has never required therapy for this. His hemoglobin has been minimally depressed. With the wintertime, I expect his hemoglobin to be down a little bit.  We will plan for another six-month followup.  I do not see any need for any additional studies  that need to be done.   Volanda Napoleon, MD 4/1/20159:16 AM

## 2013-06-13 LAB — RETICULOCYTES (CHCC)
ABS Retic: 117.5 10*3/uL (ref 19.0–186.0)
RETIC CT PCT: 2.7 % — AB (ref 0.4–2.3)

## 2013-06-13 LAB — COLD AGGLUTININ TITER: Cold Agglutinin Titer: 1:640 {titer} — AB

## 2013-06-13 LAB — HAPTOGLOBIN: Haptoglobin: 83 mg/dL (ref 45–215)

## 2013-06-30 ENCOUNTER — Ambulatory Visit (INDEPENDENT_AMBULATORY_CARE_PROVIDER_SITE_OTHER): Payer: Medicare Other | Admitting: *Deleted

## 2013-06-30 DIAGNOSIS — I480 Paroxysmal atrial fibrillation: Secondary | ICD-10-CM

## 2013-06-30 DIAGNOSIS — I4891 Unspecified atrial fibrillation: Secondary | ICD-10-CM

## 2013-06-30 DIAGNOSIS — Z5181 Encounter for therapeutic drug level monitoring: Secondary | ICD-10-CM

## 2013-06-30 LAB — POCT INR: INR: 3.3

## 2013-07-14 ENCOUNTER — Ambulatory Visit (INDEPENDENT_AMBULATORY_CARE_PROVIDER_SITE_OTHER): Payer: Medicare Other | Admitting: Pharmacist

## 2013-07-14 DIAGNOSIS — Z5181 Encounter for therapeutic drug level monitoring: Secondary | ICD-10-CM

## 2013-07-14 DIAGNOSIS — I4891 Unspecified atrial fibrillation: Secondary | ICD-10-CM

## 2013-07-14 DIAGNOSIS — I480 Paroxysmal atrial fibrillation: Secondary | ICD-10-CM

## 2013-07-14 LAB — POCT INR: INR: 3.6

## 2013-07-28 ENCOUNTER — Ambulatory Visit (INDEPENDENT_AMBULATORY_CARE_PROVIDER_SITE_OTHER): Payer: Medicare Other | Admitting: *Deleted

## 2013-07-28 DIAGNOSIS — I4891 Unspecified atrial fibrillation: Secondary | ICD-10-CM

## 2013-07-28 DIAGNOSIS — Z5181 Encounter for therapeutic drug level monitoring: Secondary | ICD-10-CM

## 2013-07-28 DIAGNOSIS — I480 Paroxysmal atrial fibrillation: Secondary | ICD-10-CM

## 2013-07-28 LAB — POCT INR: INR: 2.2

## 2013-08-18 ENCOUNTER — Ambulatory Visit (INDEPENDENT_AMBULATORY_CARE_PROVIDER_SITE_OTHER): Payer: Medicare Other | Admitting: *Deleted

## 2013-08-18 DIAGNOSIS — I480 Paroxysmal atrial fibrillation: Secondary | ICD-10-CM

## 2013-08-18 DIAGNOSIS — Z5181 Encounter for therapeutic drug level monitoring: Secondary | ICD-10-CM

## 2013-08-18 DIAGNOSIS — I4891 Unspecified atrial fibrillation: Secondary | ICD-10-CM

## 2013-08-18 LAB — POCT INR: INR: 1.8

## 2013-08-24 ENCOUNTER — Ambulatory Visit (INDEPENDENT_AMBULATORY_CARE_PROVIDER_SITE_OTHER): Payer: Medicare Other | Admitting: Cardiology

## 2013-08-24 ENCOUNTER — Encounter: Payer: Self-pay | Admitting: Cardiology

## 2013-08-24 VITALS — BP 128/70 | HR 57 | Ht 73.0 in | Wt 250.0 lb

## 2013-08-24 DIAGNOSIS — R600 Localized edema: Secondary | ICD-10-CM

## 2013-08-24 DIAGNOSIS — I10 Essential (primary) hypertension: Secondary | ICD-10-CM

## 2013-08-24 DIAGNOSIS — I739 Peripheral vascular disease, unspecified: Secondary | ICD-10-CM

## 2013-08-24 DIAGNOSIS — R609 Edema, unspecified: Secondary | ICD-10-CM

## 2013-08-24 MED ORDER — HYDROCHLOROTHIAZIDE 25 MG PO TABS: 25.0000 mg | ORAL_TABLET | Freq: Every day | ORAL | Status: DC

## 2013-08-24 NOTE — Progress Notes (Signed)
Patient ID: Steven Shepard, male   DOB: April 03, 1945, 68 y.o.   MRN: [id number]    Patient Name: Steven Shepard Date of Encounter: 08/24/2013  Primary Care Provider:  Florina Ou, MD Primary Cardiologist:  Dorothy Spark  Problem List   Past Medical History  Diagnosis Date  . DOE (dyspnea on exertion)   . Hyperlipidemia   . Hypertension   . Sleep apnea   . History of neck surgery   . Umbilical hernia   . Anemia, hemolytic 01/22/2011  . 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 Tonsillectomy    . Skin surgery      skin cancer  . Neck surgery  2005  . Umbilical hernia repair    . Total hip arthroplasty  September 18, 2010    right Dr. Maureen Ralphs    Allergies  Allergies  Allergen Reactions  . Ace Inhibitors Other (See Comments)  . Codeine   . Spironolactone     Increases potassium    HPI Mr Mustin turns today for his evaluation and management of paroxysmal A. fib and difficult to control hypertension. He also is hyperlipidemia which is being followed by Dr.Spear.  He states that he has palpitations especially after few glasses of wine but not more than once every couple of months. He does check his blood pressure at home and is normal. He is working out in the gym 2 days a week. Weight has been stable but he has not lost any. He's very compliant with his medications. No chest pain or SOB, but fatigue with exercise. Muscle pain (harmstring), worse at rest.  The patient is coming after 6 months, his checked liver function test was normal. He's complaining of lower extremity edema that has been present for the last few months and calf pain after walking moderate distances. He denies chest pain, dyspnea on exertion, he experiences a few second lasting palpitations with no syncope.  ROS ALL NEGATIVE EXCEPT THOSE NOTED IN HPI  PE  General Appearance: well developed, well nourished in no acute distress, muscular, overweight  HEENT: symmetrical face, PERRLA, good dentition  Neck: no JVD, thyromegaly, or adenopathy, trachea midline Chest: symmetric without deformity Cardiac: PMI non-displaced, RRR, normal S1, S2, soft systolic murmur left upper sternal border with normal splitting of S2, no diastolic component Lung: clear to ausculation and percussion Vascular: all pulses full without bruits  Abdominal: nondistended, nontender, good bowel sounds, no HSM, no bruits Extremities: no cyanosis, clubbing or edema, no sign of DVT, no varicosities  Skin: normal color, no rashes Neuro: alert and oriented x 3, non-focal Pysch: normal affect  EKG Normal sinus rhythm, no acute change.   Home Medications  Prior to Admission medications   Medication Sig Start Date End Date Taking? Authorizing Provider  acetaminophen (TYLENOL) 650 MG CR tablet Take 650 mg by mouth 2 (two) times daily as needed.     Yes Historical Provider, MD  aspirin 81 MG tablet Take 81 mg by mouth daily.     Yes Historical Provider, MD  atorvastatin (LIPITOR) 20 MG tablet Take 20 mg by mouth daily.     Yes Historical Provider, MD  cloNIDine (CATAPRES) 0.3 MG tablet Take 1 tablet (0.3 mg total) by mouth 2 (two) times daily. 06/17/12  Yes Renella Cunas, MD  diltiazem (CARDIZEM CD) 360 MG 24 hr capsule Take 1 capsule (360 mg total) by mouth daily. 06/17/12  Yes Renella Cunas, MD  Fexofenadine HCl (  ALLEGRA PO) Take by mouth every morning.   Yes Historical Provider, MD  folic acid (FOLVITE) 834 MCG tablet Take 400 mcg by mouth 2 (two) times daily.   Yes Historical Provider, MD  metoprolol (TOPROL-XL) 50 MG 24 hr tablet Take 50 mg by mouth 2 (two) times daily.    Yes Historical Provider, MD  Multiple Vitamin (MULTIVITAMIN) capsule Take 1 capsule by mouth daily.     Yes Historical Provider, MD  ranitidine (ZANTAC) 75 MG tablet Take 75 mg by mouth 2 (two) times daily as needed.   Yes Historical Provider, MD  Tamsulosin HCl (FLOMAX) 0.4 MG CAPS Take 0.4 mg by mouth  daily.     Yes Historical Provider, MD  tiotropium (SPIRIVA) 18 MCG inhalation capsule Place 18 mcg into inhaler and inhale daily.     Yes Historical Provider, MD  warfarin (COUMADIN) 5 MG tablet TAKE AS DIRECTED BY COUMADIN CLINIC 02/02/13  Yes Dorothy Spark, MD    Family History  Family History  Problem Relation Age of Onset  . Coronary artery disease Mother   . Coronary artery disease Father     Social History  History   Social History  . Marital Status: Married    Spouse Name: N/A    Number of Children: N/A  . Years of Education: N/A   Occupational History  . Realtor    Social History Main Topics  . Smoking status: Former Smoker -- 1.50 packs/day for 3 years    Types: Cigarettes, Pipe    Quit date: 03/12/1974  . Smokeless tobacco: Never Used     Comment: quit appox 36 years ago  . Alcohol Use: Yes     Comment: occasionally  . Drug Use: No  . Sexual Activity: Not on file   Other Topics Concern  . Not on file   Social History Narrative  . No narrative on file     Review of Systems, as per HPI, otherwise negative General:  No chills, fever, night sweats or weight changes.  Cardiovascular:  No chest pain, dyspnea on exertion, edema, orthopnea, palpitations, paroxysmal nocturnal dyspnea. Dermatological: No rash, lesions/masses Respiratory: No cough, dyspnea Urologic: No hematuria, dysuria Abdominal:   No nausea, vomiting, diarrhea, bright red blood per rectum, melena, or hematemesis Neurologic:  No visual changes, wkns, changes in mental status. All other systems reviewed and are otherwise negative except as noted above.  Physical Exam  Blood pressure 128/70, pulse 57, height 6' 1"  (1.854 m), weight 250 lb (113.399 kg).  General: Pleasant, NAD Psych: Normal affect. Neuro: Alert and oriented X 3. Moves all extremities spontaneously. HEENT: Normal  Neck: Supple without bruits or JVD. Lungs:  Resp regular and unlabored, CTA. Heart: RRR no s3, s4, or  murmurs. Abdomen: Soft, non-tender, non-distended, BS + x 4.  Extremities: No clubbing, cyanosis or edema. DP/PT/Radials 2+ and equal bilaterally.  Labs:  No results found for this basename: CKTOTAL, CKMB, TROPONINI,  in the last 72 hours Lab Results  Component Value Date   WBC 7.6 06/10/2013   HGB 12.3* 06/10/2013   HCT Unable to Determine 06/10/2013   MCV Unable to Determine 06/10/2013   PLT 177 06/10/2013   No results found for this basename: NA, K, CL, CO2, BUN, CREATININE, CALCIUM, LABALBU, PROT, BILITOT, ALKPHOS, ALT, AST, GLUCOSE,  in the last 168 hours No results found for this basename: CHOL,  HDL,  LDLCALC,  TRIG   No results found for this basename: DDIMER   No components found  with this basename: POCBNP,   Accessory Clinical Findings  Echocardiogram - none  ECG - Sinus bradycardia, RAD, negat T wave in III, and aVF, unchanged from April 2014 and December 2014    Assessment & Plan  68 year old male  1. Hypertension - well controlled, at home 130-140 range in the am, then lower throughout the day, continue the same regimen  2. Hyperlipidemia - followed by his PCP Dr Greta Doom, normal LFTs  3. Lower extremity pain at rest - normal liver enzymes, we'll check bilateral arterial duplex to rule out peripheral arterial disease.. to evaluate for statin side effects. He was advised to follow with his PCP about potentially switching to a weaker statin, I won't change as I don't know the lipids levels  4. LE edema - start HCTZ 25 mg po daily and check BMP in 3 week, we will check echo for systolic and diastolic function as none in our records   Follow up in 3 months.   Dorothy Spark, MD, [facility] 08/24/2013, 11:38 AM

## 2013-08-24 NOTE — Patient Instructions (Addendum)
**Note De-Identified  Obfuscation** Your physician has requested that you have a lower extremity arterial duplex. This test is an ultrasound of the arteries in the legs . It looks at arterial blood flow in the legs. Allow one hour for Lower and Upper Arterial scans. There are no restrictions or special instructions  Your physician has requested that you have an echocardiogram. Echocardiography is a painless test that uses sound waves to create images of your heart. It provides your doctor with information about the size and shape of your heart and how well your heart's chambers and valves are working. This procedure takes approximately one hour. There are no restrictions for this procedure.  Your physician has recommended you make the following change in your medication: start taking HCTZ 25 mg daily  Your physician recommends that you return for lab work on: September 07, 2013  Your physician recommends that you schedule a follow-up appointment in: 3 months

## 2013-08-25 ENCOUNTER — Other Ambulatory Visit (HOSPITAL_COMMUNITY): Payer: Self-pay | Admitting: *Deleted

## 2013-08-25 DIAGNOSIS — I70219 Atherosclerosis of native arteries of extremities with intermittent claudication, unspecified extremity: Secondary | ICD-10-CM

## 2013-08-27 ENCOUNTER — Other Ambulatory Visit: Payer: Self-pay | Admitting: *Deleted

## 2013-08-27 MED ORDER — CLONIDINE HCL 0.3 MG PO TABS: ORAL_TABLET | ORAL | Status: DC

## 2013-08-27 MED ORDER — DILTIAZEM HCL ER COATED BEADS 360 MG PO CP24: ORAL_CAPSULE | ORAL | Status: DC

## 2013-08-31 ENCOUNTER — Ambulatory Visit (HOSPITAL_COMMUNITY): Payer: Medicare Other | Attending: Cardiology | Admitting: Radiology

## 2013-08-31 DIAGNOSIS — R202 Paresthesia of skin: Secondary | ICD-10-CM

## 2013-08-31 DIAGNOSIS — Z87891 Personal history of nicotine dependence: Secondary | ICD-10-CM | POA: Insufficient documentation

## 2013-08-31 DIAGNOSIS — R200 Anesthesia of skin: Secondary | ICD-10-CM

## 2013-08-31 DIAGNOSIS — I70219 Atherosclerosis of native arteries of extremities with intermittent claudication, unspecified extremity: Secondary | ICD-10-CM

## 2013-08-31 DIAGNOSIS — E785 Hyperlipidemia, unspecified: Secondary | ICD-10-CM | POA: Insufficient documentation

## 2013-08-31 DIAGNOSIS — I10 Essential (primary) hypertension: Secondary | ICD-10-CM | POA: Insufficient documentation

## 2013-08-31 DIAGNOSIS — I739 Peripheral vascular disease, unspecified: Secondary | ICD-10-CM

## 2013-08-31 NOTE — Progress Notes (Signed)
Lower arterial doppler performed.

## 2013-09-08 ENCOUNTER — Other Ambulatory Visit (INDEPENDENT_AMBULATORY_CARE_PROVIDER_SITE_OTHER): Payer: Medicare Other

## 2013-09-08 ENCOUNTER — Ambulatory Visit (INDEPENDENT_AMBULATORY_CARE_PROVIDER_SITE_OTHER): Payer: Medicare Other | Admitting: Pharmacist Clinician (PhC)/ Clinical Pharmacy Specialist

## 2013-09-08 DIAGNOSIS — R609 Edema, unspecified: Secondary | ICD-10-CM

## 2013-09-08 DIAGNOSIS — I4891 Unspecified atrial fibrillation: Secondary | ICD-10-CM

## 2013-09-08 DIAGNOSIS — Z5181 Encounter for therapeutic drug level monitoring: Secondary | ICD-10-CM

## 2013-09-08 DIAGNOSIS — I739 Peripheral vascular disease, unspecified: Secondary | ICD-10-CM

## 2013-09-08 DIAGNOSIS — I480 Paroxysmal atrial fibrillation: Secondary | ICD-10-CM

## 2013-09-08 DIAGNOSIS — R600 Localized edema: Secondary | ICD-10-CM

## 2013-09-08 DIAGNOSIS — I10 Essential (primary) hypertension: Secondary | ICD-10-CM

## 2013-09-08 LAB — BASIC METABOLIC PANEL
BUN: 18 mg/dL (ref 6–23)
CO2: 27 mEq/L (ref 19–32)
Calcium: 8.9 mg/dL (ref 8.4–10.5)
Chloride: 101 mEq/L (ref 96–112)
Creatinine, Ser: 1 mg/dL (ref 0.4–1.5)
GFR: 81.19 mL/min (ref >60.00)
Glucose, Bld: 157 mg/dL — ABNORMAL HIGH (ref 70–99)
Potassium: 3.6 mEq/L (ref 3.5–5.1)
Sodium: 136 mEq/L (ref 135–145)

## 2013-09-08 LAB — POCT INR: INR: 2.2

## 2013-09-16 ENCOUNTER — Ambulatory Visit (HOSPITAL_COMMUNITY): Payer: Medicare Other | Attending: Cardiovascular Disease | Admitting: Cardiology

## 2013-09-16 DIAGNOSIS — I4891 Unspecified atrial fibrillation: Secondary | ICD-10-CM | POA: Diagnosis not present

## 2013-09-16 DIAGNOSIS — R0609 Other forms of dyspnea: Secondary | ICD-10-CM | POA: Diagnosis not present

## 2013-09-16 DIAGNOSIS — E785 Hyperlipidemia, unspecified: Secondary | ICD-10-CM | POA: Insufficient documentation

## 2013-09-16 DIAGNOSIS — I10 Essential (primary) hypertension: Secondary | ICD-10-CM | POA: Diagnosis not present

## 2013-09-16 DIAGNOSIS — F17200 Nicotine dependence, unspecified, uncomplicated: Secondary | ICD-10-CM | POA: Insufficient documentation

## 2013-09-16 DIAGNOSIS — R609 Edema, unspecified: Secondary | ICD-10-CM

## 2013-09-16 DIAGNOSIS — R0989 Other specified symptoms and signs involving the circulatory and respiratory systems: Secondary | ICD-10-CM | POA: Insufficient documentation

## 2013-09-16 DIAGNOSIS — R600 Localized edema: Secondary | ICD-10-CM

## 2013-09-16 NOTE — Progress Notes (Signed)
Echo performed. 

## 2013-10-06 ENCOUNTER — Ambulatory Visit (INDEPENDENT_AMBULATORY_CARE_PROVIDER_SITE_OTHER): Payer: Medicare Other

## 2013-10-06 DIAGNOSIS — I4891 Unspecified atrial fibrillation: Secondary | ICD-10-CM

## 2013-10-06 DIAGNOSIS — I480 Paroxysmal atrial fibrillation: Secondary | ICD-10-CM

## 2013-10-06 DIAGNOSIS — Z5181 Encounter for therapeutic drug level monitoring: Secondary | ICD-10-CM

## 2013-10-06 LAB — POCT INR: INR: 1.7

## 2013-10-22 ENCOUNTER — Ambulatory Visit (INDEPENDENT_AMBULATORY_CARE_PROVIDER_SITE_OTHER): Payer: Medicare Other | Admitting: Pharmacist

## 2013-10-22 DIAGNOSIS — Z5181 Encounter for therapeutic drug level monitoring: Secondary | ICD-10-CM

## 2013-10-22 DIAGNOSIS — I480 Paroxysmal atrial fibrillation: Secondary | ICD-10-CM

## 2013-10-22 DIAGNOSIS — I4891 Unspecified atrial fibrillation: Secondary | ICD-10-CM

## 2013-10-22 LAB — POCT INR: INR: 2.8

## 2013-11-13 ENCOUNTER — Telehealth: Payer: Self-pay | Admitting: Hematology & Oncology

## 2013-11-13 NOTE — Telephone Encounter (Signed)
Pt moved 10-1 to 10-16 RN aware

## 2013-11-18 ENCOUNTER — Other Ambulatory Visit: Payer: Self-pay | Admitting: Gastroenterology

## 2013-11-18 DIAGNOSIS — K824 Cholesterolosis of gallbladder: Secondary | ICD-10-CM

## 2013-11-19 ENCOUNTER — Ambulatory Visit (INDEPENDENT_AMBULATORY_CARE_PROVIDER_SITE_OTHER): Payer: Medicare Other | Admitting: *Deleted

## 2013-11-19 DIAGNOSIS — Z5181 Encounter for therapeutic drug level monitoring: Secondary | ICD-10-CM

## 2013-11-19 DIAGNOSIS — I4891 Unspecified atrial fibrillation: Secondary | ICD-10-CM

## 2013-11-19 DIAGNOSIS — I480 Paroxysmal atrial fibrillation: Secondary | ICD-10-CM

## 2013-11-19 LAB — POCT INR: INR: 2.3

## 2013-11-25 ENCOUNTER — Encounter: Payer: Self-pay | Admitting: Cardiology

## 2013-11-25 ENCOUNTER — Telehealth: Payer: Self-pay | Admitting: Cardiology

## 2013-11-25 ENCOUNTER — Ambulatory Visit (INDEPENDENT_AMBULATORY_CARE_PROVIDER_SITE_OTHER): Payer: Medicare Other | Admitting: Cardiology

## 2013-11-25 VITALS — BP 130/78 | HR 63 | Ht 73.0 in | Wt 251.0 lb

## 2013-11-25 DIAGNOSIS — I358 Other nonrheumatic aortic valve disorders: Secondary | ICD-10-CM

## 2013-11-25 DIAGNOSIS — E785 Hyperlipidemia, unspecified: Secondary | ICD-10-CM

## 2013-11-25 DIAGNOSIS — R011 Cardiac murmur, unspecified: Secondary | ICD-10-CM

## 2013-11-25 DIAGNOSIS — I10 Essential (primary) hypertension: Secondary | ICD-10-CM

## 2013-11-25 DIAGNOSIS — E119 Type 2 diabetes mellitus without complications: Secondary | ICD-10-CM

## 2013-11-25 DIAGNOSIS — I4891 Unspecified atrial fibrillation: Secondary | ICD-10-CM

## 2013-11-25 DIAGNOSIS — Z7901 Long term (current) use of anticoagulants: Secondary | ICD-10-CM

## 2013-11-25 DIAGNOSIS — I480 Paroxysmal atrial fibrillation: Secondary | ICD-10-CM

## 2013-11-25 DIAGNOSIS — I70219 Atherosclerosis of native arteries of extremities with intermittent claudication, unspecified extremity: Secondary | ICD-10-CM

## 2013-11-25 MED ORDER — FUROSEMIDE 40 MG PO TABS: 40.0000 mg | ORAL_TABLET | Freq: Every day | ORAL | Status: DC

## 2013-11-25 MED ORDER — POTASSIUM CHLORIDE ER 10 MEQ PO TBCR: 10.0000 meq | EXTENDED_RELEASE_TABLET | Freq: Every day | ORAL | Status: DC

## 2013-11-25 NOTE — Patient Instructions (Addendum)
Your physician has recommended you make the following change in your medication:   STOP TAKING HYDROCHLOROTHIAZIDE NOW  START TAKING LASIX 40 MG DAILY  START TAKING POTASSIUM 10 mEq DAILY   Your physician recommends that you return for lab work in: [place] [address] [place] (CMP, HEMOGLOBIN A1C, LIPIDS)   PLEASE BE FASTING AT [place]    Your physician recommends that you schedule a follow-up appointment in: [place]

## 2013-11-25 NOTE — Telephone Encounter (Signed)
Pt wanted to know if its ok to start taking his newly prescribed Lasix and Potassium pill until next Monday, d/t driving out of town, long distances.  Pt states he will continue to take her current regimen prior to, and start new meds next Monday.  Pt states its for comfort reasons because of long travel time.  Informed the pt that would be safe to take his current regiment until next Monday, and then d/c the HCTZ and start the Lasix and K.  Pt verbalized understanding and pleased with all the assistance provided.

## 2013-11-25 NOTE — Progress Notes (Signed)
Patient ID: Demaurion Dicioccio, male   DOB: [birth year], 68 y.o.   MRN: [id number]    Patient Name: Steven Shepard Date of Encounter: 11/25/2013  Primary Care Provider:  Florina Ou, MD Primary Cardiologist:  Dorothy Spark  Problem List   Past Medical History  Diagnosis Date  . DOE (dyspnea on exertion)   . Hyperlipidemia   . Hypertension   . Sleep apnea   . History of neck surgery   . Umbilical hernia   . Anemia, hemolytic 01/22/2011  . 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 Tonsillectomy    . Skin surgery      skin cancer  . Neck surgery  2005  . Umbilical hernia repair    . Total hip arthroplasty  September 18, 2010    right Dr. Maureen Ralphs    Allergies  Allergies  Allergen Reactions  . Ace Inhibitors Other (See Comments)  . Codeine   . Spironolactone     Increases potassium    HPI Mr Licciardi turns today for his evaluation and management of paroxysmal A. fib and difficult to control hypertension. He also is hyperlipidemia which is being followed by Dr.Spear.  He states that he has palpitations especially after few glasses of wine but not more than once every couple of months. He does check his blood pressure at home and is normal. He is working out in the gym 2 days a week. Weight has been stable but he has not lost any. He's very compliant with his medications. No chest pain or SOB, but fatigue with exercise. Muscle pain (harmstring), worse at rest.  The patient is coming after 3 months, his checked liver function test was normal. He's complaining of unchanged lower extremity edema despite HCTZ, no chest pain, stable exertional SOB, no palpitations or syncope.  He has been seen by GI for fatty liver and gallbladder polyps.  The patient has elevated blood sugar, he is starting diet under dietitian (his brother in law) and staring to exercise. HbA1c checked yesterday, no results yet.   PE  General Appearance:  well developed, well nourished in no acute distress, muscular, overweight HEENT: symmetrical face, PERRLA, good dentition  Neck: no JVD, thyromegaly, or adenopathy, trachea midline Chest: symmetric without deformity Cardiac: PMI non-displaced, RRR, normal S1, S2, soft systolic murmur left upper sternal border with normal splitting of S2, no diastolic component Lung: clear to ausculation and percussion Vascular: all pulses full without bruits  Abdominal: nondistended, nontender, good bowel sounds, no HSM, no bruits Extremities: no cyanosis, clubbing or edema, no sign of DVT, no varicosities  Skin: normal color, no rashes Neuro: alert and oriented x 3, non-focal Pysch: normal affect  EKG Normal sinus rhythm, no acute change.   Home Medications  Prior to Admission medications   Medication Sig Start Date End Date Taking? Authorizing Provider  acetaminophen (TYLENOL) 650 MG CR tablet Take 650 mg by mouth 2 (two) times daily as needed.     Yes Historical Provider, MD  aspirin 81 MG tablet Take 81 mg by mouth daily.     Yes Historical Provider, MD  atorvastatin (LIPITOR) 20 MG tablet Take 20 mg by mouth daily.     Yes Historical Provider, MD  cloNIDine (CATAPRES) 0.3 MG tablet Take 1 tablet (0.3 mg total) by mouth 2 (two) times daily. 06/17/12  Yes Renella Cunas, MD  diltiazem (CARDIZEM CD) 360 MG 24 hr capsule Take 1 capsule (360 mg total) by  mouth daily. 06/17/12  Yes Renella Cunas, MD  Fexofenadine HCl (ALLEGRA PO) Take by mouth every morning.   Yes Historical Provider, MD  folic acid (FOLVITE) 774 MCG tablet Take 400 mcg by mouth 2 (two) times daily.   Yes Historical Provider, MD  metoprolol (TOPROL-XL) 50 MG 24 hr tablet Take 50 mg by mouth 2 (two) times daily.    Yes Historical Provider, MD  Multiple Vitamin (MULTIVITAMIN) capsule Take 1 capsule by mouth daily.     Yes Historical Provider, MD  ranitidine (ZANTAC) 75 MG tablet Take 75 mg by mouth 2 (two) times daily as needed.   Yes Historical  Provider, MD  Tamsulosin HCl (FLOMAX) 0.4 MG CAPS Take 0.4 mg by mouth daily.     Yes Historical Provider, MD  tiotropium (SPIRIVA) 18 MCG inhalation capsule Place 18 mcg into inhaler and inhale daily.     Yes Historical Provider, MD  warfarin (COUMADIN) 5 MG tablet TAKE AS DIRECTED BY COUMADIN CLINIC 02/02/13  Yes Dorothy Spark, MD    Family History  Family History  Problem Relation Age of Onset  . Coronary artery disease Mother   . Coronary artery disease Father     Social History  History   Social History  . Marital Status: Married    Spouse Name: N/A    Number of Children: N/A  . Years of Education: N/A   Occupational History  . Realtor    Social History Main Topics  . Smoking status: Former Smoker -- 1.50 packs/day for 3 years    Types: Cigarettes, Pipe    Quit date: 03/12/1974  . Smokeless tobacco: Never Used     Comment: quit appox 36 years ago  . Alcohol Use: Yes     Comment: occasionally  . Drug Use: No  . Sexual Activity: Not on file   Other Topics Concern  . Not on file   Social History Narrative  . No narrative on file     Review of Systems, as per HPI, otherwise negative General:  No chills, fever, night sweats or weight changes.  Cardiovascular:  No chest pain, dyspnea on exertion, edema, orthopnea, palpitations, paroxysmal nocturnal dyspnea. Dermatological: No rash, lesions/masses Respiratory: No cough, dyspnea Urologic: No hematuria, dysuria Abdominal:   No nausea, vomiting, diarrhea, bright red blood per rectum, melena, or hematemesis Neurologic:  No visual changes, wkns, changes in mental status. All other systems reviewed and are otherwise negative except as noted above.  Physical Exam  Blood pressure 130/78, pulse 63, height 6' 1"  (1.854 m), weight 251 lb (113.853 kg).  General: Pleasant, NAD Psych: Normal affect. Neuro: Alert and oriented X 3. Moves all extremities spontaneously. HEENT: Normal  Neck: Supple without bruits or  JVD. Lungs:  Resp regular and unlabored, CTA. Heart: RRR no s3, s4, or murmurs. Abdomen: Soft, non-tender, non-distended, BS + x 4.  Extremities: No clubbing, cyanosis or edema. DP/PT/Radials 2+ and equal bilaterally.  Labs:  No results found for this basename: CKTOTAL, CKMB, TROPONINI,  in the last 72 hours Lab Results  Component Value Date   WBC 7.6 06/10/2013   HGB 12.3* 06/10/2013   HCT Unable to Determine 06/10/2013   MCV Unable to Determine 06/10/2013   PLT 177 06/10/2013   Accessory Clinical Findings  Echocardiogram - none  ECG - Sinus bradycardia, RAD, negat T wave in III, and aVF, unchanged from April 2014 and December 2014    Assessment & Plan  68 year old male  1. Hypertension -  well controlled now with adding HCTZ  2. Hyperlipidemia - followed by his PCP Dr Greta Doom, normal LFTs  3. Lower extremity pain at rest - normal liver enzymes, normal bilateral arterial duplex.  4. LE edema - no improvement to HCTZ, we will switch to Lasix 40 mg po daily and KCl 10 mEq daily.   5. Abnormal blood sugar - starting diet, encourage to exercise, we will check HbA1c in 2 months  Follow up in 2 months with CMP, lipids and HbA1c.   Dorothy Spark, MD, [facility] 11/25/2013, 8:14 AM

## 2013-11-25 NOTE — Telephone Encounter (Signed)
New message    Patient calling back to speak with the nurse - seen in the office today

## 2013-11-26 ENCOUNTER — Other Ambulatory Visit: Payer: Self-pay | Admitting: *Deleted

## 2013-11-26 ENCOUNTER — Ambulatory Visit: Payer: Medicare Other | Admitting: Cardiology

## 2013-11-27 ENCOUNTER — Other Ambulatory Visit: Payer: Self-pay | Admitting: *Deleted

## 2013-11-27 ENCOUNTER — Other Ambulatory Visit: Payer: Self-pay

## 2013-11-27 MED ORDER — WARFARIN SODIUM 5 MG PO TABS: ORAL_TABLET | ORAL | Status: DC

## 2013-11-27 MED ORDER — CLONIDINE HCL 0.3 MG PO TABS: ORAL_TABLET | ORAL | Status: DC

## 2013-11-27 MED ORDER — DILTIAZEM HCL ER COATED BEADS 360 MG PO CP24: ORAL_CAPSULE | ORAL | Status: DC

## 2013-12-01 ENCOUNTER — Ambulatory Visit
Admission: RE | Admit: 2013-12-01 | Discharge: 2013-12-01 | Disposition: A | Discharge status: Home / Self Care | Payer: Medicare Other | Source: Ambulatory Visit | Attending: Gastroenterology | Admitting: Gastroenterology

## 2013-12-01 DIAGNOSIS — K824 Cholesterolosis of gallbladder: Secondary | ICD-10-CM

## 2013-12-08 ENCOUNTER — Encounter: Payer: Self-pay | Admitting: Pharmacist

## 2013-12-08 NOTE — Progress Notes (Signed)
This encounter was created in error - please disregard.

## 2013-12-10 ENCOUNTER — Other Ambulatory Visit: Payer: Medicare Other | Admitting: Lab

## 2013-12-10 ENCOUNTER — Ambulatory Visit: Payer: Medicare Other | Admitting: Hematology & Oncology

## 2013-12-15 ENCOUNTER — Ambulatory Visit (INDEPENDENT_AMBULATORY_CARE_PROVIDER_SITE_OTHER): Payer: Medicare Other | Admitting: *Deleted

## 2013-12-15 DIAGNOSIS — I480 Paroxysmal atrial fibrillation: Secondary | ICD-10-CM

## 2013-12-15 DIAGNOSIS — Z5181 Encounter for therapeutic drug level monitoring: Secondary | ICD-10-CM

## 2013-12-15 LAB — POCT INR: INR: 1.7

## 2013-12-25 ENCOUNTER — Encounter: Payer: Self-pay | Admitting: Hematology & Oncology

## 2013-12-25 ENCOUNTER — Ambulatory Visit (HOSPITAL_BASED_OUTPATIENT_CLINIC_OR_DEPARTMENT_OTHER): Payer: Medicare Other | Admitting: Hematology & Oncology

## 2013-12-25 ENCOUNTER — Other Ambulatory Visit (HOSPITAL_BASED_OUTPATIENT_CLINIC_OR_DEPARTMENT_OTHER): Payer: Medicare Other | Admitting: Lab

## 2013-12-25 VITALS — BP 120/70 | HR 59 | Temp 98.0°F | Resp 18 | Ht 71.0 in | Wt 239.0 lb

## 2013-12-25 DIAGNOSIS — D591 Other autoimmune hemolytic anemias: Secondary | ICD-10-CM

## 2013-12-25 DIAGNOSIS — D589 Hereditary hemolytic anemia, unspecified: Secondary | ICD-10-CM

## 2013-12-25 DIAGNOSIS — D5912 Cold autoimmune hemolytic anemia: Secondary | ICD-10-CM

## 2013-12-25 LAB — CBC WITH DIFFERENTIAL (CANCER CENTER ONLY)
BASO#: 0 10*3/uL (ref 0.0–0.2)
BASO%: 0.5 % (ref 0.0–2.0)
EOS%: 0.8 % (ref 0.0–7.0)
Eosinophils Absolute: 0.1 10*3/uL (ref 0.0–0.5)
HCT: UNDETERMINED % (ref 38.7–49.9)
HGB: 13.3 g/dL (ref 13.0–17.1)
LYMPH#: 1.4 10*3/uL (ref 0.9–3.3)
LYMPH%: 22.4 % (ref 14.0–48.0)
MCH: UNDETERMINED pg (ref 28.0–33.4)
MCHC: UNDETERMINED g/dL (ref 32.0–35.9)
MCV: UNDETERMINED fL (ref 82–98)
MONO#: 0.6 10*3/uL (ref 0.1–0.9)
MONO%: 9.3 % (ref 0.0–13.0)
NEUT#: 4.3 10*3/uL (ref 1.5–6.5)
NEUT%: 67 % (ref 40.0–80.0)
PLATELETS: 190 10*3/uL (ref 145–400)
RBC: UNDETERMINED 10*6/uL (ref 4.20–5.70)
WBC: 6.4 10*3/uL (ref 4.0–10.0)

## 2013-12-25 LAB — CHCC SATELLITE - SMEAR

## 2013-12-25 NOTE — Progress Notes (Signed)
Hematology and Oncology Follow Up Visit  Steven Shepard [id number] [birth year] 68 y.o. 12/25/2013   Principle Diagnosis:   Cold agglutinin disease  Current Therapy:    Folic acid 2 mg by mouth daily     Interim History:  Steven Shepard is back for followup to be seen his 6 months. He's done well. He had no problems over the summer. He makes sure that he stays warm.  He is still working. He's had no problems with work. He's had no fatigue. Appetite is doing good. He's had no bleeding. He is on Coumadin. He has atrial fibrillation.  He's had no rashes.  He's lost some weight loss. He's had a few changes in his medications.  He's had no headache. He's had no abdominal pain. There's been no cough or shortness of breath. He's had no change in bowel or bladder habits.  He's had no palpable lymph glands. He's had no issues with i infections  Medications: Current outpatient prescriptions:acetaminophen (TYLENOL) 650 MG CR tablet, Take 650 mg by mouth 2 (two) times daily as needed.  , Disp: , Rfl: ;  aspirin 81 MG tablet, Take 81 mg by mouth daily.  , Disp: , Rfl: ;  atorvastatin (LIPITOR) 20 MG tablet, Take 20 mg by mouth daily.  , Disp: , Rfl: ;  cloNIDine (CATAPRES) 0.3 MG tablet, TAKE 1 TABLET BY MOUTH TWICE A DAY, Disp: 180 tablet, Rfl: 0 diltiazem (DILTIAZEM HCL CD) 360 MG 24 hr capsule, TAKE ONE CAPSULE BY MOUTH EVERY DAY, Disp: 90 capsule, Rfl: 0;  Fexofenadine HCl (ALLEGRA PO), Take by mouth every morning., Disp: , Rfl: ;  folic acid (FOLVITE) 725 MCG tablet, Take 400 mcg by mouth 2 (two) times daily., Disp: , Rfl: ;  furosemide (LASIX) 40 MG tablet, Take 1 tablet (40 mg total) by mouth daily., Disp: 90 tablet, Rfl: 6 metoprolol (TOPROL-XL) 50 MG 24 hr tablet, Take 50 mg by mouth 2 (two) times daily. , Disp: , Rfl: ;  Multiple Vitamin (MULTIVITAMIN) capsule, Take 1 capsule by mouth daily.  , Disp: , Rfl: ;  potassium chloride (K-DUR) 10 MEQ tablet, Take 1 tablet (10 mEq total) by mouth  daily., Disp: 90 tablet, Rfl: 6;  ranitidine (ZANTAC) 75 MG tablet, Take 75 mg by mouth as needed. , Disp: , Rfl:  Tamsulosin HCl (FLOMAX) 0.4 MG CAPS, Take 0.4 mg by mouth daily.  , Disp: , Rfl: ;  warfarin (COUMADIN) 5 MG tablet, Take as directed by Coumadin Clinic, Disp: 30 tablet, Rfl: 3  Allergies:  Allergies  Allergen Reactions  . Ace Inhibitors Other (See Comments)  . Codeine   . Spironolactone     Increases potassium    Past Medical History, Surgical history, Social history, and Family History were reviewed and updated.  Review of Systems: As above  Physical Exam:  height is 5' 11"  (1.803 m) and weight is 239 lb (108.41 kg). His oral temperature is 98 F (36.7 C). His blood pressure is 120/70 and his pulse is 59. His respiration is 18.   Well-developed and well-nourished gentleman. Head and neck exam shows no adenopathy in the neck. There is no scleral icterus. Lungs are clear. Cardiac exam regular in rhythm. He does have a 2/6 systolic ejection murmur. Abdomen soft. He has good bowel sounds. There is no fluid wave. There is no palpable liver or spleen tip. Extremities shows no clubbing cyanosis or edema. Skin exam no rashes.  Lab Results  Component Value Date   WBC  6.4 12/25/2013   HGB 13.3 12/25/2013   HCT Unable to Determine 12/25/2013   MCV Unable to Determine 12/25/2013   PLT 190 12/25/2013     Chemistry      Component Value Date/Time   NA 136 09/08/2013 0805   K 3.6 09/08/2013 0805   CL 101 09/08/2013 0805   CO2 27 09/08/2013 0805   BUN 18 09/08/2013 0805   CREATININE 1.0 09/08/2013 0805      Component Value Date/Time   CALCIUM 8.9 09/08/2013 0805   ALKPHOS 71 02/24/2013 0924   AST 24 02/24/2013 0924   ALT 25 02/24/2013 0924   BILITOT 1.5* 02/24/2013 0924         Impression and Plan: Steven Shepard is 68 year-old gentleman with cold agglutinin disease. This is idiopathic. He has never required therapy for this. His hemoglobin has been pretty stable over  the summer, it goes up. With the wintertime, I expect his hemoglobin to be down a little bit.  Since he is doing well. I don't we have to see her back formally. I told him that he can was call us if he thinks he needs to have blood work done. He is definitely okay with this. I told him to make sure that he takes his folic acid.  I do not see any need for any additional studies that need to be done.   Volanda Napoleon, MD 10/16/20152:47 PM

## 2013-12-29 LAB — RETICULOCYTES (CHCC)
ABS RETIC: 78.5 10*3/uL (ref 19.0–186.0)
RBC.: 4.13 MIL/uL — ABNORMAL LOW (ref 4.22–5.81)
Retic Ct Pct: 1.9 % (ref 0.4–2.3)

## 2013-12-29 LAB — COLD AGGLUTININ TITER: Cold Agglutinin Titer: 1:5120 {titer} — AB

## 2013-12-30 ENCOUNTER — Ambulatory Visit (INDEPENDENT_AMBULATORY_CARE_PROVIDER_SITE_OTHER): Payer: Medicare Other | Admitting: Pharmacist

## 2013-12-30 DIAGNOSIS — Z5181 Encounter for therapeutic drug level monitoring: Secondary | ICD-10-CM

## 2013-12-30 DIAGNOSIS — I480 Paroxysmal atrial fibrillation: Secondary | ICD-10-CM

## 2013-12-30 LAB — POCT INR: INR: 1.6

## 2014-01-11 DIAGNOSIS — N400 Enlarged prostate without lower urinary tract symptoms: Secondary | ICD-10-CM | POA: Insufficient documentation

## 2014-01-14 ENCOUNTER — Ambulatory Visit (INDEPENDENT_AMBULATORY_CARE_PROVIDER_SITE_OTHER): Payer: Medicare Other | Admitting: *Deleted

## 2014-01-14 DIAGNOSIS — I480 Paroxysmal atrial fibrillation: Secondary | ICD-10-CM

## 2014-01-14 DIAGNOSIS — Z5181 Encounter for therapeutic drug level monitoring: Secondary | ICD-10-CM

## 2014-01-14 LAB — POCT INR: INR: 2.1

## 2014-01-19 ENCOUNTER — Other Ambulatory Visit (INDEPENDENT_AMBULATORY_CARE_PROVIDER_SITE_OTHER): Payer: Medicare Other

## 2014-01-19 DIAGNOSIS — Z7901 Long term (current) use of anticoagulants: Secondary | ICD-10-CM

## 2014-01-19 DIAGNOSIS — E785 Hyperlipidemia, unspecified: Secondary | ICD-10-CM

## 2014-01-19 DIAGNOSIS — I480 Paroxysmal atrial fibrillation: Secondary | ICD-10-CM

## 2014-01-19 DIAGNOSIS — I10 Essential (primary) hypertension: Secondary | ICD-10-CM

## 2014-01-19 DIAGNOSIS — E119 Type 2 diabetes mellitus without complications: Secondary | ICD-10-CM

## 2014-01-19 DIAGNOSIS — R011 Cardiac murmur, unspecified: Secondary | ICD-10-CM

## 2014-01-19 DIAGNOSIS — I358 Other nonrheumatic aortic valve disorders: Secondary | ICD-10-CM

## 2014-01-19 LAB — LIPID PANEL
Cholesterol: 133 mg/dL (ref 0–200)
HDL: 32.5 mg/dL — ABNORMAL LOW (ref >39.00)
LDL Cholesterol: 85 mg/dL (ref 0–99)
NonHDL: 100.5
Total CHOL/HDL Ratio: 4
Triglycerides: 80 mg/dL (ref 0.0–149.0)
VLDL: 16 mg/dL (ref 0.0–40.0)

## 2014-01-19 LAB — COMPREHENSIVE METABOLIC PANEL
ALT: 21 U/L (ref 0–53)
AST: 20 U/L (ref 0–37)
Albumin: 3.4 g/dL — ABNORMAL LOW (ref 3.5–5.2)
Alkaline Phosphatase: 63 U/L (ref 39–117)
BUN: 15 mg/dL (ref 6–23)
CO2: 27 mEq/L (ref 19–32)
Calcium: 9 mg/dL (ref 8.4–10.5)
Chloride: 105 mEq/L (ref 96–112)
Creatinine, Ser: 1 mg/dL (ref 0.4–1.5)
GFR: 75.72 mL/min (ref >60.00)
Glucose, Bld: 106 mg/dL — ABNORMAL HIGH (ref 70–99)
Potassium: 4.2 mEq/L (ref 3.5–5.1)
Sodium: 142 mEq/L (ref 135–145)
Total Bilirubin: 1.3 mg/dL — ABNORMAL HIGH (ref 0.2–1.2)
Total Protein: 6.4 g/dL (ref 6.0–8.3)

## 2014-01-19 LAB — HEMOGLOBIN A1C: Hgb A1c MFr Bld: 5.3 % (ref 4.6–6.5)

## 2014-01-22 ENCOUNTER — Telehealth: Payer: Self-pay | Admitting: Cardiology

## 2014-01-22 NOTE — Telephone Encounter (Signed)
New message      Returning a nurses call from yesterday

## 2014-01-22 NOTE — Telephone Encounter (Signed)
Already informed

## 2014-01-22 NOTE — Telephone Encounter (Signed)
**Note De-Identified  Obfuscation** The pt is advised of his lab results, he verbalized understanding.

## 2014-01-25 ENCOUNTER — Encounter: Payer: Self-pay | Admitting: Cardiology

## 2014-01-25 ENCOUNTER — Ambulatory Visit (INDEPENDENT_AMBULATORY_CARE_PROVIDER_SITE_OTHER): Payer: Medicare Other | Admitting: Cardiology

## 2014-01-25 ENCOUNTER — Ambulatory Visit (INDEPENDENT_AMBULATORY_CARE_PROVIDER_SITE_OTHER): Payer: Medicare Other | Admitting: *Deleted

## 2014-01-25 VITALS — BP 126/72 | HR 59 | Ht 71.0 in | Wt 232.0 lb

## 2014-01-25 DIAGNOSIS — I10 Essential (primary) hypertension: Secondary | ICD-10-CM

## 2014-01-25 DIAGNOSIS — Z5181 Encounter for therapeutic drug level monitoring: Secondary | ICD-10-CM

## 2014-01-25 DIAGNOSIS — I70219 Atherosclerosis of native arteries of extremities with intermittent claudication, unspecified extremity: Secondary | ICD-10-CM

## 2014-01-25 DIAGNOSIS — I480 Paroxysmal atrial fibrillation: Secondary | ICD-10-CM

## 2014-01-25 LAB — POCT INR: INR: 2.2

## 2014-01-25 NOTE — Progress Notes (Signed)
Patient ID: Nico Syme, male   DOB: [birth year], 68 y.o.   MRN: [id number]    Patient Name: Steven Shepard Date of Encounter: 01/25/2014  Primary Care Provider:  Florina Ou, MD Primary Cardiologist:  Dorothy Spark  Problem List   Past Medical History  Diagnosis Date  . DOE (dyspnea on exertion)   . Hyperlipidemia   . Hypertension   . Sleep apnea   . History of neck surgery   . Umbilical hernia   . Anemia, hemolytic 01/22/2011  . 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 Tonsillectomy    . Skin surgery      skin cancer  . Neck surgery  2005  . Umbilical hernia repair    . Total hip arthroplasty  September 18, 2010    right Dr. Maureen Ralphs    Allergies  Allergies  Allergen Reactions  . Ace Inhibitors Other (See Comments)  . Codeine   . Spironolactone     Increases potassium    HPI Mr Michon turns today for his evaluation and management of paroxysmal A. fib and difficult to control hypertension. He also is hyperlipidemia which is being followed by Dr.Spear.  He states that he has palpitations especially after few glasses of wine but not more than once every couple of months. He does check his blood pressure at home and is normal. He is working out in the gym 2 days a week. Weight has been stable but he has not lost any. He's very compliant with his medications. No chest pain or SOB, but fatigue with exercise. Muscle pain (harmstring), worse at rest.  The patient is coming after 3 months, his checked liver function test was normal. He's complaining of unchanged lower extremity edema despite HCTZ, no chest pain, stable exertional SOB, no palpitations or syncope.  He has been seen by GI for fatty liver and gallbladder polyps.  The patient has elevated blood sugar, he is starting diet under dietitian (his brother in law) and staring to exercise. HbA1c checked yesterday, no results yet.  01/25/2014 - patient is  coming for 2 months for follow-up for lower extremity edema. He states that his edema has significantly improved since switching from hydrochlorothiazide to Lasix. He is compliant with all of his meds. He denies any chest pain or shortness of breath. He exercises twice a week with a personal trainer at the gym. His no other complaints.   PE  General Appearance: well developed, well nourished in no acute distress, muscular, overweight HEENT: symmetrical face, PERRLA, good dentition  Neck: no JVD, thyromegaly, or adenopathy, trachea midline Chest: symmetric without deformity Cardiac: PMI non-displaced, RRR, normal S1, S2, soft systolic murmur left upper sternal border with normal splitting of S2, no diastolic component Lung: clear to ausculation and percussion Vascular: all pulses full without bruits  Abdominal: nondistended, nontender, good bowel sounds, no HSM, no bruits Extremities: no cyanosis, clubbing or edema, no sign of DVT, no varicosities  Skin: normal color, no rashes Neuro: alert and oriented x 3, non-focal Pysch: normal affect  EKG Normal sinus rhythm, no acute change.   Home Medications  Prior to Admission medications   Medication Sig Start Date End Date Taking? Authorizing Provider  acetaminophen (TYLENOL) 650 MG CR tablet Take 650 mg by mouth 2 (two) times daily as needed.     Yes Historical Provider, MD  aspirin 81 MG tablet Take 81 mg by mouth daily.     Yes Historical Provider,  MD  atorvastatin (LIPITOR) 20 MG tablet Take 20 mg by mouth daily.     Yes Historical Provider, MD  cloNIDine (CATAPRES) 0.3 MG tablet Take 1 tablet (0.3 mg total) by mouth 2 (two) times daily. 06/17/12  Yes Renella Cunas, MD  diltiazem (CARDIZEM CD) 360 MG 24 hr capsule Take 1 capsule (360 mg total) by mouth daily. 06/17/12  Yes Renella Cunas, MD  Fexofenadine HCl (ALLEGRA PO) Take by mouth every morning.   Yes Historical Provider, MD  folic acid (FOLVITE) 828 MCG tablet Take 400 mcg by mouth 2  (two) times daily.   Yes Historical Provider, MD  metoprolol (TOPROL-XL) 50 MG 24 hr tablet Take 50 mg by mouth 2 (two) times daily.    Yes Historical Provider, MD  Multiple Vitamin (MULTIVITAMIN) capsule Take 1 capsule by mouth daily.     Yes Historical Provider, MD  ranitidine (ZANTAC) 75 MG tablet Take 75 mg by mouth 2 (two) times daily as needed.   Yes Historical Provider, MD  Tamsulosin HCl (FLOMAX) 0.4 MG CAPS Take 0.4 mg by mouth daily.     Yes Historical Provider, MD  tiotropium (SPIRIVA) 18 MCG inhalation capsule Place 18 mcg into inhaler and inhale daily.     Yes Historical Provider, MD  warfarin (COUMADIN) 5 MG tablet TAKE AS DIRECTED BY COUMADIN CLINIC 02/02/13  Yes Dorothy Spark, MD    Family History  Family History  Problem Relation Age of Onset  . Coronary artery disease Mother   . Coronary artery disease Father     Social History  History   Social History  . Marital Status: Married    Spouse Name: N/A    Number of Children: N/A  . Years of Education: N/A   Occupational History  . Realtor    Social History Main Topics  . Smoking status: Former Smoker -- 1.50 packs/day for 3 years    Types: Cigarettes, Pipe    Quit date: 03/12/1974  . Smokeless tobacco: Never Used     Comment: quit appox 36 years ago  . Alcohol Use: Yes     Comment: occasionally  . Drug Use: No  . Sexual Activity: Not on file   Other Topics Concern  . Not on file   Social History Narrative     Review of Systems, as per HPI, otherwise negative General:  No chills, fever, night sweats or weight changes.  Cardiovascular:  No chest pain, dyspnea on exertion, edema, orthopnea, palpitations, paroxysmal nocturnal dyspnea. Dermatological: No rash, lesions/masses Respiratory: No cough, dyspnea Urologic: No hematuria, dysuria Abdominal:   No nausea, vomiting, diarrhea, bright red blood per rectum, melena, or hematemesis Neurologic:  No visual changes, wkns, changes in mental status. All  other systems reviewed and are otherwise negative except as noted above.  Physical Exam  Blood pressure 126/72, pulse 59, height 5' 11"  (1.803 m), weight 232 lb (105.235 kg).  General: Pleasant, NAD Psych: Normal affect. Neuro: Alert and oriented X 3. Moves all extremities spontaneously. HEENT: Normal  Neck: Supple without bruits or JVD. Lungs:  Resp regular and unlabored, CTA. Heart: RRR no s3, s4, or murmurs. Abdomen: Soft, non-tender, non-distended, BS + x 4.  Extremities: No clubbing, cyanosis or edema. DP/PT/Radials 2+ and equal bilaterally.  Labs:  No results for input(s): CKTOTAL, CKMB, TROPONINI in the last 72 hours. Lab Results  Component Value Date   WBC 6.4 12/25/2013   HGB 13.3 12/25/2013   HCT Unable to Determine 12/25/2013  MCV Unable to Determine 12/25/2013   PLT 190 12/25/2013   Accessory Clinical Findings  Echocardiogram - 09/16/2013 Left ventricle: The cavity size was normal. Wall thickness was increased in a pattern of mild LVH. Systolic function was normal. The estimated ejection fraction was in the range of 55% to 60%. Wall motion was normal; there were no regional wall motion abnormalities. - Left atrium: The atrium was mildly dilated. - Atrial septum: No defect or patent foramen ovale was identified.  ECG - Sinus bradycardia, RAD, negat T wave in III, and aVF, unchanged from April 2014 and December 2014    Assessment & Plan  68 year old male  1. Hypertension - well controlled now with adding HCTZ  2. Hyperlipidemia - followed by his PCP Dr Greta Doom, normal LFTs  3. Lower extremity pain at rest - normal liver enzymes, normal bilateral arterial duplex.  4. LE edema - no improvement to HCTZ, resulted Lasix.   5. Abnormal blood sugar - with strict diet and exercise a hemoglobin A1c 5.3% 40s normal value.  Follow-up in one year.   Dorothy Spark, MD, [facility] 01/25/2014, 9:04 AM

## 2014-01-25 NOTE — Patient Instructions (Signed)
Your physician wants you to follow-up in: [place]. You will receive a reminder letter in the mail two months in advance. If you don't receive a letter, please call our office to schedule the follow-up appointment.   NO CHANGES WERE MADE TODAY

## 2014-02-23 ENCOUNTER — Ambulatory Visit (INDEPENDENT_AMBULATORY_CARE_PROVIDER_SITE_OTHER): Payer: Medicare Other

## 2014-02-23 DIAGNOSIS — I480 Paroxysmal atrial fibrillation: Secondary | ICD-10-CM

## 2014-02-23 DIAGNOSIS — Z5181 Encounter for therapeutic drug level monitoring: Secondary | ICD-10-CM

## 2014-02-23 LAB — POCT INR: INR: 1.7

## 2014-02-25 ENCOUNTER — Other Ambulatory Visit: Payer: Self-pay | Admitting: *Deleted

## 2014-02-25 MED ORDER — CLONIDINE HCL 0.3 MG PO TABS: ORAL_TABLET | ORAL | Status: DC

## 2014-02-25 MED ORDER — DILTIAZEM HCL ER COATED BEADS 360 MG PO CP24: ORAL_CAPSULE | ORAL | Status: DC

## 2014-03-16 ENCOUNTER — Ambulatory Visit (INDEPENDENT_AMBULATORY_CARE_PROVIDER_SITE_OTHER): Payer: Medicare Other

## 2014-03-16 DIAGNOSIS — L821 Other seborrheic keratosis: Secondary | ICD-10-CM | POA: Diagnosis not present

## 2014-03-16 DIAGNOSIS — D2372 Other benign neoplasm of skin of left lower limb, including hip: Secondary | ICD-10-CM | POA: Diagnosis not present

## 2014-03-16 DIAGNOSIS — D1801 Hemangioma of skin and subcutaneous tissue: Secondary | ICD-10-CM | POA: Diagnosis not present

## 2014-03-16 DIAGNOSIS — D2271 Melanocytic nevi of right lower limb, including hip: Secondary | ICD-10-CM | POA: Diagnosis not present

## 2014-03-16 DIAGNOSIS — Z85828 Personal history of other malignant neoplasm of skin: Secondary | ICD-10-CM | POA: Diagnosis not present

## 2014-03-16 DIAGNOSIS — I480 Paroxysmal atrial fibrillation: Secondary | ICD-10-CM

## 2014-03-16 DIAGNOSIS — L570 Actinic keratosis: Secondary | ICD-10-CM | POA: Diagnosis not present

## 2014-03-16 DIAGNOSIS — D2261 Melanocytic nevi of right upper limb, including shoulder: Secondary | ICD-10-CM | POA: Diagnosis not present

## 2014-03-16 DIAGNOSIS — Z5181 Encounter for therapeutic drug level monitoring: Secondary | ICD-10-CM | POA: Diagnosis not present

## 2014-03-16 DIAGNOSIS — D2262 Melanocytic nevi of left upper limb, including shoulder: Secondary | ICD-10-CM | POA: Diagnosis not present

## 2014-03-16 DIAGNOSIS — D225 Melanocytic nevi of trunk: Secondary | ICD-10-CM | POA: Diagnosis not present

## 2014-03-16 LAB — POCT INR: INR: 1.7

## 2014-03-17 DIAGNOSIS — M4726 Other spondylosis with radiculopathy, lumbar region: Secondary | ICD-10-CM | POA: Diagnosis not present

## 2014-03-17 DIAGNOSIS — M4806 Spinal stenosis, lumbar region: Secondary | ICD-10-CM | POA: Diagnosis not present

## 2014-03-17 DIAGNOSIS — Z6830 Body mass index (BMI) 30.0-30.9, adult: Secondary | ICD-10-CM | POA: Diagnosis not present

## 2014-03-17 DIAGNOSIS — M4316 Spondylolisthesis, lumbar region: Secondary | ICD-10-CM | POA: Diagnosis not present

## 2014-03-17 DIAGNOSIS — M5136 Other intervertebral disc degeneration, lumbar region: Secondary | ICD-10-CM | POA: Diagnosis not present

## 2014-03-30 ENCOUNTER — Ambulatory Visit (INDEPENDENT_AMBULATORY_CARE_PROVIDER_SITE_OTHER): Payer: Medicare Other | Admitting: Pharmacist

## 2014-03-30 DIAGNOSIS — I480 Paroxysmal atrial fibrillation: Secondary | ICD-10-CM

## 2014-03-30 DIAGNOSIS — Z5181 Encounter for therapeutic drug level monitoring: Secondary | ICD-10-CM

## 2014-03-30 LAB — POCT INR: INR: 2.3

## 2014-04-13 ENCOUNTER — Ambulatory Visit (INDEPENDENT_AMBULATORY_CARE_PROVIDER_SITE_OTHER): Payer: Medicare Other | Admitting: *Deleted

## 2014-04-13 DIAGNOSIS — I480 Paroxysmal atrial fibrillation: Secondary | ICD-10-CM

## 2014-04-13 DIAGNOSIS — Z5181 Encounter for therapeutic drug level monitoring: Secondary | ICD-10-CM

## 2014-04-13 LAB — POCT INR: INR: 2.6

## 2014-04-13 NOTE — Patient Instructions (Signed)
For your procedure take your last dose of coumadin on 04/15/14 for procedure on 04/22/14. Resume Coumadin as instructed by Dr. Sherwood Gambler. Call us with any questions [phone]

## 2014-04-22 DIAGNOSIS — M4806 Spinal stenosis, lumbar region: Secondary | ICD-10-CM | POA: Diagnosis not present

## 2014-04-22 DIAGNOSIS — M5136 Other intervertebral disc degeneration, lumbar region: Secondary | ICD-10-CM | POA: Diagnosis not present

## 2014-04-22 DIAGNOSIS — M47816 Spondylosis without myelopathy or radiculopathy, lumbar region: Secondary | ICD-10-CM | POA: Diagnosis not present

## 2014-04-22 DIAGNOSIS — M4316 Spondylolisthesis, lumbar region: Secondary | ICD-10-CM | POA: Diagnosis not present

## 2014-04-23 ENCOUNTER — Telehealth: Payer: Self-pay | Admitting: *Deleted

## 2014-04-23 NOTE — Telephone Encounter (Signed)
Patient called and stated that per Dr. Sherwood Gambler he should restart Coumadin on 11/13/98 due to complications after spinal injection. He had an upcoming appt on 2/16 and would like to change appt 1 week after Coumadin restart. Appt changed to 1 week after starting coumadin back. He understands risks of stroke and clots. Advised to follow Physician instructions.

## 2014-05-04 ENCOUNTER — Ambulatory Visit (INDEPENDENT_AMBULATORY_CARE_PROVIDER_SITE_OTHER): Payer: Medicare Other | Admitting: *Deleted

## 2014-05-04 DIAGNOSIS — I480 Paroxysmal atrial fibrillation: Secondary | ICD-10-CM

## 2014-05-04 DIAGNOSIS — Z5181 Encounter for therapeutic drug level monitoring: Secondary | ICD-10-CM

## 2014-05-04 DIAGNOSIS — M150 Primary generalized (osteo)arthritis: Secondary | ICD-10-CM | POA: Diagnosis not present

## 2014-05-04 DIAGNOSIS — M1990 Unspecified osteoarthritis, unspecified site: Secondary | ICD-10-CM | POA: Diagnosis not present

## 2014-05-04 LAB — POCT INR: INR: 1.6

## 2014-05-13 ENCOUNTER — Ambulatory Visit (INDEPENDENT_AMBULATORY_CARE_PROVIDER_SITE_OTHER): Payer: Medicare Other | Admitting: *Deleted

## 2014-05-13 DIAGNOSIS — Z5181 Encounter for therapeutic drug level monitoring: Secondary | ICD-10-CM

## 2014-05-13 DIAGNOSIS — I480 Paroxysmal atrial fibrillation: Secondary | ICD-10-CM

## 2014-05-13 LAB — POCT INR: INR: 3.2

## 2014-05-17 ENCOUNTER — Other Ambulatory Visit: Payer: Self-pay | Admitting: *Deleted

## 2014-05-18 MED ORDER — WARFARIN SODIUM 5 MG PO TABS: ORAL_TABLET | ORAL | Status: DC

## 2014-05-18 NOTE — Telephone Encounter (Signed)
Refill done as requested

## 2014-05-25 ENCOUNTER — Ambulatory Visit (INDEPENDENT_AMBULATORY_CARE_PROVIDER_SITE_OTHER): Payer: Medicare Other | Admitting: *Deleted

## 2014-05-25 DIAGNOSIS — I480 Paroxysmal atrial fibrillation: Secondary | ICD-10-CM | POA: Diagnosis not present

## 2014-05-25 DIAGNOSIS — Z5181 Encounter for therapeutic drug level monitoring: Secondary | ICD-10-CM | POA: Diagnosis not present

## 2014-05-25 LAB — POCT INR: INR: 3.4

## 2014-05-26 ENCOUNTER — Encounter (INDEPENDENT_AMBULATORY_CARE_PROVIDER_SITE_OTHER): Payer: Self-pay

## 2014-05-26 ENCOUNTER — Encounter: Payer: Self-pay | Admitting: Pulmonary Disease

## 2014-05-26 ENCOUNTER — Ambulatory Visit (INDEPENDENT_AMBULATORY_CARE_PROVIDER_SITE_OTHER): Payer: Medicare Other | Admitting: Pulmonary Disease

## 2014-05-26 VITALS — BP 102/68 | HR 62 | Ht 73.0 in | Wt 230.6 lb

## 2014-05-26 DIAGNOSIS — G4733 Obstructive sleep apnea (adult) (pediatric): Secondary | ICD-10-CM | POA: Diagnosis not present

## 2014-05-26 DIAGNOSIS — J410 Simple chronic bronchitis: Secondary | ICD-10-CM

## 2014-05-26 NOTE — Assessment & Plan Note (Signed)
Remains on Allegra as needed for allergies. No SOB or DOE, no persistent cough.

## 2014-05-26 NOTE — Progress Notes (Signed)
   Subjective:    Patient ID: Steven Shepard, male    DOB: 1945/07/08, 69 y.o.   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  PSG showed severe obstructive sleep apnea with AHI 86/h corrected by CPAP 9 cm  Started on CPAP 9cm, small nasal comfort gel mask, heated humidity, C-flex setting of 2 cm -   downloads sep & oct' 12 >> excellent compliance 7.5 h on 9 cm, no leak   Download on 9cm 11/13 -no residuals, small leak +, good usage  pneumovax 2011 2010 Spirometry FEV1 83%, ratio 69   76mDownload 04/2013 - no residuals, , usage >7h, excellent! 05/26/14: >7h usage, no leakage, av pressure 8, AHI 0.2.  He is tolerating cpap well, feels more refreshed, no leak, mask ok, pressure ok Remains on spiriva & allegra A. Fib has been rate controlled, remains on coumadin, Cardiologist is Dr. NMeda Coffee On Lasix 470maily.  Has mild peripheral edema but follows low sodium diet. Was prediabetic but has lost weight by cutting carbs/sugars with improvement of blood sugar.    Review of Systems neg for any significant sore throat, dysphagia, itching, sneezing, nasal congestion or excess/ purulent secretions, fever, chills, sweats, unintended wt loss, pleuritic or exertional cp, hempoptysis, orthopnea pnd or change in chronic leg swelling. Also denies presyncope, palpitations, heartburn, abdominal pain, nausea, vomiting, diarrhea or change in bowel or urinary habits, dysuria,hematuria, rash, arthralgias, visual complaints, headache, numbness weakness or ataxia.     Objective:   Physical Exam  Gen. Pleasant, obese, in no distress ENT - no lesions, no post nasal drip Neck: No JVD, no thyromegaly, no carotid bruits Lungs: no use of accessory muscles, no dullness to percussion, decreased without rales or rhonchi  Cardiovascular: Rhythm regular, heart sounds  normal, no murmurs  or gallops, 1+ pitting edema bilaterally up to his knees Musculoskeletal: No deformities, no cyanosis or clubbing , no tremors       Assessment & Plan:

## 2014-05-26 NOTE — Patient Instructions (Signed)
-  Excellent job on using your CPAP machine.  -Follow up with [place] in 1 year or sooner as needed.

## 2014-05-26 NOTE — Assessment & Plan Note (Addendum)
Excellent compliance >7 hrs, tolerating mask well. AHI 0.2, no leak.  Continue current settings.  Follow up in 1 yr  Weight loss encouraged, compliance with goal of at least 4-6 hrs every night is the expectation. Advised against medications with sedative side effects Cautioned against driving when sleepy - understanding that sleepiness will vary on a day to day basis

## 2014-06-14 ENCOUNTER — Ambulatory Visit (INDEPENDENT_AMBULATORY_CARE_PROVIDER_SITE_OTHER): Payer: Medicare Other

## 2014-06-14 DIAGNOSIS — Z5181 Encounter for therapeutic drug level monitoring: Secondary | ICD-10-CM | POA: Diagnosis not present

## 2014-06-14 DIAGNOSIS — I480 Paroxysmal atrial fibrillation: Secondary | ICD-10-CM | POA: Diagnosis not present

## 2014-06-14 LAB — POCT INR: INR: 2.7

## 2014-06-15 DIAGNOSIS — Z6830 Body mass index (BMI) 30.0-30.9, adult: Secondary | ICD-10-CM | POA: Diagnosis not present

## 2014-06-15 DIAGNOSIS — M4316 Spondylolisthesis, lumbar region: Secondary | ICD-10-CM | POA: Diagnosis not present

## 2014-06-15 DIAGNOSIS — M4726 Other spondylosis with radiculopathy, lumbar region: Secondary | ICD-10-CM | POA: Diagnosis not present

## 2014-06-15 DIAGNOSIS — M4806 Spinal stenosis, lumbar region: Secondary | ICD-10-CM | POA: Diagnosis not present

## 2014-06-15 DIAGNOSIS — M5136 Other intervertebral disc degeneration, lumbar region: Secondary | ICD-10-CM | POA: Diagnosis not present

## 2014-06-25 DIAGNOSIS — Z7982 Long term (current) use of aspirin: Secondary | ICD-10-CM | POA: Diagnosis not present

## 2014-06-25 DIAGNOSIS — S9031XA Contusion of right foot, initial encounter: Secondary | ICD-10-CM | POA: Diagnosis not present

## 2014-06-25 DIAGNOSIS — S99921A Unspecified injury of right foot, initial encounter: Secondary | ICD-10-CM | POA: Diagnosis not present

## 2014-06-25 DIAGNOSIS — M19071 Primary osteoarthritis, right ankle and foot: Secondary | ICD-10-CM | POA: Diagnosis not present

## 2014-06-25 DIAGNOSIS — M79671 Pain in right foot: Secondary | ICD-10-CM | POA: Diagnosis not present

## 2014-06-25 DIAGNOSIS — I10 Essential (primary) hypertension: Secondary | ICD-10-CM | POA: Diagnosis not present

## 2014-06-25 DIAGNOSIS — Z7901 Long term (current) use of anticoagulants: Secondary | ICD-10-CM | POA: Diagnosis not present

## 2014-06-29 DIAGNOSIS — Z85828 Personal history of other malignant neoplasm of skin: Secondary | ICD-10-CM | POA: Diagnosis not present

## 2014-06-29 DIAGNOSIS — L821 Other seborrheic keratosis: Secondary | ICD-10-CM | POA: Diagnosis not present

## 2014-06-29 DIAGNOSIS — D485 Neoplasm of uncertain behavior of skin: Secondary | ICD-10-CM | POA: Diagnosis not present

## 2014-06-29 DIAGNOSIS — D2372 Other benign neoplasm of skin of left lower limb, including hip: Secondary | ICD-10-CM | POA: Diagnosis not present

## 2014-06-29 DIAGNOSIS — L570 Actinic keratosis: Secondary | ICD-10-CM | POA: Diagnosis not present

## 2014-06-29 DIAGNOSIS — L859 Epidermal thickening, unspecified: Secondary | ICD-10-CM | POA: Diagnosis not present

## 2014-06-29 DIAGNOSIS — L738 Other specified follicular disorders: Secondary | ICD-10-CM | POA: Diagnosis not present

## 2014-07-09 DIAGNOSIS — S838X2A Sprain of other specified parts of left knee, initial encounter: Secondary | ICD-10-CM | POA: Diagnosis not present

## 2014-07-09 DIAGNOSIS — M25562 Pain in left knee: Secondary | ICD-10-CM | POA: Diagnosis not present

## 2014-07-09 DIAGNOSIS — M2242 Chondromalacia patellae, left knee: Secondary | ICD-10-CM | POA: Diagnosis not present

## 2014-07-09 DIAGNOSIS — M25662 Stiffness of left knee, not elsewhere classified: Secondary | ICD-10-CM | POA: Diagnosis not present

## 2014-07-14 ENCOUNTER — Ambulatory Visit (INDEPENDENT_AMBULATORY_CARE_PROVIDER_SITE_OTHER): Payer: Medicare Other | Admitting: *Deleted

## 2014-07-14 DIAGNOSIS — Z5181 Encounter for therapeutic drug level monitoring: Secondary | ICD-10-CM

## 2014-07-14 DIAGNOSIS — I480 Paroxysmal atrial fibrillation: Secondary | ICD-10-CM

## 2014-07-14 LAB — POCT INR: INR: 2.1

## 2014-07-27 DIAGNOSIS — M25562 Pain in left knee: Secondary | ICD-10-CM | POA: Diagnosis not present

## 2014-07-27 DIAGNOSIS — M2242 Chondromalacia patellae, left knee: Secondary | ICD-10-CM | POA: Diagnosis not present

## 2014-08-18 ENCOUNTER — Ambulatory Visit (INDEPENDENT_AMBULATORY_CARE_PROVIDER_SITE_OTHER): Payer: Medicare Other | Admitting: Pharmacist

## 2014-08-18 DIAGNOSIS — Z5181 Encounter for therapeutic drug level monitoring: Secondary | ICD-10-CM

## 2014-08-18 DIAGNOSIS — I480 Paroxysmal atrial fibrillation: Secondary | ICD-10-CM

## 2014-08-18 LAB — POCT INR: INR: 2.3

## 2014-08-19 DIAGNOSIS — H35033 Hypertensive retinopathy, bilateral: Secondary | ICD-10-CM | POA: Diagnosis not present

## 2014-08-23 DIAGNOSIS — Z0000 Encounter for general adult medical examination without abnormal findings: Secondary | ICD-10-CM | POA: Diagnosis not present

## 2014-08-23 DIAGNOSIS — Z125 Encounter for screening for malignant neoplasm of prostate: Secondary | ICD-10-CM | POA: Diagnosis not present

## 2014-08-23 DIAGNOSIS — R972 Elevated prostate specific antigen [PSA]: Secondary | ICD-10-CM | POA: Diagnosis not present

## 2014-08-23 DIAGNOSIS — I10 Essential (primary) hypertension: Secondary | ICD-10-CM | POA: Diagnosis not present

## 2014-09-22 ENCOUNTER — Ambulatory Visit (INDEPENDENT_AMBULATORY_CARE_PROVIDER_SITE_OTHER): Payer: Medicare Other | Admitting: Pharmacist

## 2014-09-22 DIAGNOSIS — Z5181 Encounter for therapeutic drug level monitoring: Secondary | ICD-10-CM | POA: Diagnosis not present

## 2014-09-22 DIAGNOSIS — I480 Paroxysmal atrial fibrillation: Secondary | ICD-10-CM | POA: Diagnosis not present

## 2014-09-22 LAB — POCT INR: INR: 2.1

## 2014-10-05 DIAGNOSIS — Z85828 Personal history of other malignant neoplasm of skin: Secondary | ICD-10-CM | POA: Diagnosis not present

## 2014-10-05 DIAGNOSIS — L821 Other seborrheic keratosis: Secondary | ICD-10-CM | POA: Diagnosis not present

## 2014-10-05 DIAGNOSIS — L570 Actinic keratosis: Secondary | ICD-10-CM | POA: Diagnosis not present

## 2014-10-05 DIAGNOSIS — D1801 Hemangioma of skin and subcutaneous tissue: Secondary | ICD-10-CM | POA: Diagnosis not present

## 2014-10-13 ENCOUNTER — Telehealth: Payer: Self-pay | Admitting: Hematology & Oncology

## 2014-10-13 NOTE — Telephone Encounter (Signed)
Faxed all medical records to:  [place]  [address] F: [phone] P: [phone]  If did not get records, advised to call: [phone] for [place] pick up.  Case: [id number]   COPY SCANNED

## 2014-10-19 ENCOUNTER — Telehealth: Payer: Self-pay | Admitting: Cardiology

## 2014-10-19 NOTE — Telephone Encounter (Signed)
Will route this message to Dr Meda Coffee to advise on this.

## 2014-10-19 NOTE — Telephone Encounter (Signed)
New message    Pt needs referral to a general PCP and would like to know who Dr. Meda Coffee would suggest

## 2014-10-20 NOTE — Telephone Encounter (Signed)
Physicians from [facility] on [address], he should call and see if any of them accept new patients.

## 2014-10-20 NOTE — Telephone Encounter (Signed)
Left detailed message on pts confirmed VM that per Dr Meda Coffee she recommends the pt seek out a MD from [facility] on [address], and he should call and see if they accept any new pts.  Left detailed message that a referral will not be required for the pt to establish care with recommended PCP.  Left detailed message for the pt to call back with any additional questions.

## 2014-10-29 DIAGNOSIS — D044 Carcinoma in situ of skin of scalp and neck: Secondary | ICD-10-CM | POA: Diagnosis not present

## 2014-10-29 DIAGNOSIS — Z85828 Personal history of other malignant neoplasm of skin: Secondary | ICD-10-CM | POA: Diagnosis not present

## 2014-10-29 DIAGNOSIS — L821 Other seborrheic keratosis: Secondary | ICD-10-CM | POA: Diagnosis not present

## 2014-11-03 ENCOUNTER — Ambulatory Visit (INDEPENDENT_AMBULATORY_CARE_PROVIDER_SITE_OTHER): Payer: Medicare Other | Admitting: Pharmacist

## 2014-11-03 DIAGNOSIS — Z5181 Encounter for therapeutic drug level monitoring: Secondary | ICD-10-CM

## 2014-11-03 DIAGNOSIS — I480 Paroxysmal atrial fibrillation: Secondary | ICD-10-CM

## 2014-11-03 LAB — POCT INR: INR: 2.5

## 2014-11-09 ENCOUNTER — Other Ambulatory Visit: Payer: Self-pay | Admitting: Gastroenterology

## 2014-11-09 DIAGNOSIS — I480 Paroxysmal atrial fibrillation: Secondary | ICD-10-CM | POA: Diagnosis not present

## 2014-11-09 DIAGNOSIS — Z7901 Long term (current) use of anticoagulants: Secondary | ICD-10-CM | POA: Diagnosis not present

## 2014-11-09 DIAGNOSIS — K7581 Nonalcoholic steatohepatitis (NASH): Secondary | ICD-10-CM

## 2014-11-09 DIAGNOSIS — R932 Abnormal findings on diagnostic imaging of liver and biliary tract: Secondary | ICD-10-CM

## 2014-11-09 DIAGNOSIS — K824 Cholesterolosis of gallbladder: Secondary | ICD-10-CM | POA: Diagnosis not present

## 2014-11-09 DIAGNOSIS — D591 Other autoimmune hemolytic anemias: Secondary | ICD-10-CM | POA: Diagnosis not present

## 2014-11-12 ENCOUNTER — Ambulatory Visit
Admission: RE | Admit: 2014-11-12 | Discharge: 2014-11-12 | Disposition: A | Discharge status: Home / Self Care | Payer: Medicare Other | Source: Ambulatory Visit | Attending: Gastroenterology | Admitting: Gastroenterology

## 2014-11-12 DIAGNOSIS — R932 Abnormal findings on diagnostic imaging of liver and biliary tract: Secondary | ICD-10-CM

## 2014-11-12 DIAGNOSIS — K7581 Nonalcoholic steatohepatitis (NASH): Secondary | ICD-10-CM

## 2014-11-14 ENCOUNTER — Other Ambulatory Visit: Payer: Self-pay | Admitting: Cardiology

## 2014-11-23 ENCOUNTER — Telehealth: Payer: Self-pay | Admitting: Hematology & Oncology

## 2014-11-23 NOTE — Telephone Encounter (Signed)
Faxed medical records to:  [facility], [place]  [id number] P: [phone] F: [place] SCANNED

## 2014-11-24 ENCOUNTER — Telehealth: Payer: Self-pay | Admitting: Cardiology

## 2014-11-24 NOTE — Telephone Encounter (Signed)
New message    Pt has question regarding something that is noted in his records Pt needs clarification for his life insurance

## 2014-11-24 NOTE — Telephone Encounter (Signed)
Pt calling to request his echo results from 09/16/13 Dr Meda Coffee ordered and resulted.  Pt request to pick this report up, for he needs this to apply for life insurance.  Printed off report and informed the pt this will be available for him to pick up at the front desk today.  Pt verbalized understanding and gracious for all the assistance provided.  Confirmed with Maudie Mercury in medical records that this is ok to leave this report at the front desk for the pt to pick up.  Per Maudie Mercury she states that the greeter will need the pt to sign a pt access form when he comes in to pick this form up.  Will inform Greeter Amber to have the pt sign this form.

## 2014-12-09 ENCOUNTER — Other Ambulatory Visit: Payer: Self-pay | Admitting: Obstetrics and Gynecology

## 2014-12-09 DIAGNOSIS — M5136 Other intervertebral disc degeneration, lumbar region: Secondary | ICD-10-CM | POA: Diagnosis not present

## 2014-12-09 DIAGNOSIS — M4806 Spinal stenosis, lumbar region: Secondary | ICD-10-CM | POA: Diagnosis not present

## 2014-12-09 DIAGNOSIS — R160 Hepatomegaly, not elsewhere classified: Secondary | ICD-10-CM

## 2014-12-09 DIAGNOSIS — M47816 Spondylosis without myelopathy or radiculopathy, lumbar region: Secondary | ICD-10-CM | POA: Diagnosis not present

## 2014-12-14 ENCOUNTER — Ambulatory Visit (INDEPENDENT_AMBULATORY_CARE_PROVIDER_SITE_OTHER): Payer: Medicare Other | Admitting: *Deleted

## 2014-12-14 ENCOUNTER — Ambulatory Visit
Admission: RE | Admit: 2014-12-14 | Discharge: 2014-12-14 | Disposition: A | Discharge status: Home / Self Care | Payer: Medicare Other | Source: Ambulatory Visit | Attending: Obstetrics and Gynecology | Admitting: Obstetrics and Gynecology

## 2014-12-14 DIAGNOSIS — K7689 Other specified diseases of liver: Secondary | ICD-10-CM | POA: Diagnosis not present

## 2014-12-14 DIAGNOSIS — R160 Hepatomegaly, not elsewhere classified: Secondary | ICD-10-CM

## 2014-12-14 DIAGNOSIS — I480 Paroxysmal atrial fibrillation: Secondary | ICD-10-CM | POA: Diagnosis not present

## 2014-12-14 DIAGNOSIS — Z5181 Encounter for therapeutic drug level monitoring: Secondary | ICD-10-CM | POA: Diagnosis not present

## 2014-12-14 LAB — POCT INR: INR: 1

## 2014-12-14 MED ORDER — IOPAMIDOL (ISOVUE-300) INJECTION 61%
125.0000 mL | Freq: Once | INTRAVENOUS | Status: AC | PRN
  Administered 2014-12-14: 125 mL via INTRAVENOUS

## 2014-12-22 ENCOUNTER — Ambulatory Visit (INDEPENDENT_AMBULATORY_CARE_PROVIDER_SITE_OTHER): Payer: Medicare Other | Admitting: *Deleted

## 2014-12-22 DIAGNOSIS — I480 Paroxysmal atrial fibrillation: Secondary | ICD-10-CM | POA: Diagnosis not present

## 2014-12-22 DIAGNOSIS — Z5181 Encounter for therapeutic drug level monitoring: Secondary | ICD-10-CM

## 2014-12-22 LAB — POCT INR: INR: 1.9

## 2014-12-28 DIAGNOSIS — L821 Other seborrheic keratosis: Secondary | ICD-10-CM | POA: Diagnosis not present

## 2014-12-28 DIAGNOSIS — D2362 Other benign neoplasm of skin of left upper limb, including shoulder: Secondary | ICD-10-CM | POA: Diagnosis not present

## 2014-12-28 DIAGNOSIS — L570 Actinic keratosis: Secondary | ICD-10-CM | POA: Diagnosis not present

## 2014-12-28 DIAGNOSIS — D692 Other nonthrombocytopenic purpura: Secondary | ICD-10-CM | POA: Diagnosis not present

## 2014-12-28 DIAGNOSIS — Z85828 Personal history of other malignant neoplasm of skin: Secondary | ICD-10-CM | POA: Diagnosis not present

## 2014-12-28 DIAGNOSIS — D485 Neoplasm of uncertain behavior of skin: Secondary | ICD-10-CM | POA: Diagnosis not present

## 2015-01-05 ENCOUNTER — Ambulatory Visit (INDEPENDENT_AMBULATORY_CARE_PROVIDER_SITE_OTHER): Payer: Medicare Other | Admitting: *Deleted

## 2015-01-05 DIAGNOSIS — Z5181 Encounter for therapeutic drug level monitoring: Secondary | ICD-10-CM

## 2015-01-05 DIAGNOSIS — I480 Paroxysmal atrial fibrillation: Secondary | ICD-10-CM | POA: Diagnosis not present

## 2015-01-05 LAB — POCT INR: INR: 3.6

## 2015-01-19 ENCOUNTER — Ambulatory Visit (INDEPENDENT_AMBULATORY_CARE_PROVIDER_SITE_OTHER): Payer: Medicare Other | Admitting: *Deleted

## 2015-01-19 DIAGNOSIS — Z5181 Encounter for therapeutic drug level monitoring: Secondary | ICD-10-CM | POA: Diagnosis not present

## 2015-01-19 DIAGNOSIS — I480 Paroxysmal atrial fibrillation: Secondary | ICD-10-CM | POA: Diagnosis not present

## 2015-01-19 LAB — POCT INR: INR: 2.4

## 2015-02-04 ENCOUNTER — Other Ambulatory Visit: Payer: Self-pay | Admitting: Cardiology

## 2015-02-10 ENCOUNTER — Ambulatory Visit (INDEPENDENT_AMBULATORY_CARE_PROVIDER_SITE_OTHER): Payer: Medicare Other | Admitting: *Deleted

## 2015-02-10 DIAGNOSIS — Z5181 Encounter for therapeutic drug level monitoring: Secondary | ICD-10-CM

## 2015-02-10 DIAGNOSIS — I480 Paroxysmal atrial fibrillation: Secondary | ICD-10-CM

## 2015-02-10 LAB — POCT INR: INR: 2.3

## 2015-02-11 ENCOUNTER — Other Ambulatory Visit: Payer: Self-pay | Admitting: Cardiology

## 2015-02-11 ENCOUNTER — Telehealth: Payer: Self-pay | Admitting: *Deleted

## 2015-02-11 NOTE — Telephone Encounter (Signed)
Will have to forward this message to Dr Meda Coffee for further review and orders to change recommended medication, and follow-up thereafter.

## 2015-02-11 NOTE — Telephone Encounter (Signed)
Pharmacist from [facility] on [address] left a voicemail on the refill line stating that the patient is in the donut hole and the cardizem cd 374m will cost him $281.00, but he can get cardizem cd 1850mcheaper and take two daily. Ok to change?

## 2015-02-13 NOTE — Telephone Encounter (Signed)
No problem, Steven Shepard please change. K

## 2015-02-14 ENCOUNTER — Other Ambulatory Visit: Payer: Self-pay | Admitting: *Deleted

## 2015-02-14 MED ORDER — DILTIAZEM HCL ER COATED BEADS 180 MG PO CP24: 360.0000 mg | ORAL_CAPSULE | Freq: Every day | ORAL | Status: DC

## 2015-02-14 NOTE — Telephone Encounter (Signed)
New rx sent in for cardizem cd 176m with a sig of take two daily.

## 2015-02-16 ENCOUNTER — Ambulatory Visit (INDEPENDENT_AMBULATORY_CARE_PROVIDER_SITE_OTHER): Payer: Medicare Other | Admitting: Cardiology

## 2015-02-16 ENCOUNTER — Encounter: Payer: Self-pay | Admitting: Cardiology

## 2015-02-16 VITALS — BP 142/74 | HR 66 | Ht 72.5 in | Wt 249.0 lb

## 2015-02-16 DIAGNOSIS — I5031 Acute diastolic (congestive) heart failure: Secondary | ICD-10-CM | POA: Diagnosis not present

## 2015-02-16 DIAGNOSIS — I480 Paroxysmal atrial fibrillation: Secondary | ICD-10-CM | POA: Diagnosis not present

## 2015-02-16 DIAGNOSIS — E785 Hyperlipidemia, unspecified: Secondary | ICD-10-CM

## 2015-02-16 DIAGNOSIS — R600 Localized edema: Secondary | ICD-10-CM

## 2015-02-16 DIAGNOSIS — I110 Hypertensive heart disease with heart failure: Secondary | ICD-10-CM

## 2015-02-16 DIAGNOSIS — I4891 Unspecified atrial fibrillation: Secondary | ICD-10-CM | POA: Diagnosis not present

## 2015-02-16 DIAGNOSIS — I509 Heart failure, unspecified: Secondary | ICD-10-CM | POA: Diagnosis not present

## 2015-02-16 LAB — CBC WITH DIFFERENTIAL/PLATELET
Basophils Absolute: 0 10*3/uL (ref 0.0–0.1)
Basophils Relative: 0 % (ref 0–1)
Eosinophils Absolute: 0.1 10*3/uL (ref 0.0–0.7)
Eosinophils Relative: 1 % (ref 0–5)
HCT: 38.6 % — ABNORMAL LOW (ref 39.0–52.0)
Hemoglobin: 13.7 g/dL (ref 13.0–17.0)
Lymphocytes Relative: 32 % (ref 12–46)
Lymphs Abs: 2.2 10*3/uL (ref 0.7–4.0)
MCH: 33.3 pg (ref 26.0–34.0)
MCHC: 35.5 g/dL (ref 30.0–36.0)
MCV: 93.7 fL (ref 78.0–100.0)
MPV: 9.8 fL (ref 8.6–12.4)
Monocytes Absolute: 0.6 10*3/uL (ref 0.1–1.0)
Monocytes Relative: 9 % (ref 3–12)
Neutro Abs: 4 10*3/uL (ref 1.7–7.7)
Neutrophils Relative %: 58 % (ref 43–77)
Platelets: 246 10*3/uL (ref 150–400)
RBC: 4.12 MIL/uL — ABNORMAL LOW (ref 4.22–5.81)
RDW: 15.7 % — ABNORMAL HIGH (ref 11.5–15.5)
WBC: 6.9 10*3/uL (ref 4.0–10.5)

## 2015-02-16 LAB — COMPREHENSIVE METABOLIC PANEL
ALT: 28 U/L (ref 9–46)
AST: 19 U/L (ref 10–35)
Albumin: 4.2 g/dL (ref 3.6–5.1)
Alkaline Phosphatase: 76 U/L (ref 40–115)
BUN: 14 mg/dL (ref 7–25)
CO2: 28 mmol/L (ref 20–31)
Calcium: 9.1 mg/dL (ref 8.6–10.3)
Chloride: 103 mmol/L (ref 98–110)
Creat: 0.77 mg/dL (ref 0.70–1.25)
Glucose, Bld: 84 mg/dL (ref 65–99)
Potassium: 3.7 mmol/L (ref 3.5–5.3)
Sodium: 139 mmol/L (ref 135–146)
Total Bilirubin: 2 mg/dL — ABNORMAL HIGH (ref 0.2–1.2)
Total Protein: 6.3 g/dL (ref 6.1–8.1)

## 2015-02-16 LAB — TSH: TSH: 3.721 u[IU]/mL (ref 0.350–4.500)

## 2015-02-16 MED ORDER — FUROSEMIDE 40 MG PO TABS: 40.0000 mg | ORAL_TABLET | Freq: Two times a day (BID) | ORAL | Status: DC

## 2015-02-16 MED ORDER — POTASSIUM CHLORIDE CRYS ER 20 MEQ PO TBCR: 20.0000 meq | EXTENDED_RELEASE_TABLET | Freq: Every day | ORAL | Status: DC

## 2015-02-16 NOTE — Progress Notes (Signed)
Patient ID: Steven Shepard, male   DOB: [birth year], 69 y.o.   MRN: [id number]    Patient Name: Steven Shepard Date of Encounter: 02/16/2015  Primary Care Provider:  Charolette Forward, PA-C Primary Cardiologist:  Dorothy Spark  Problem List   Past Medical History  Diagnosis Date  . DOE (dyspnea on exertion)   . Hyperlipidemia   . Hypertension   . Sleep apnea   . History of neck surgery   . Umbilical hernia   . Anemia, hemolytic ([place]) 01/22/2011  . 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 Tonsillectomy    . Skin surgery      skin cancer  . Neck surgery  2005  . Umbilical hernia repair    . Total hip arthroplasty  September 18, 2010    right Dr. Maureen Ralphs    Allergies  Allergies  Allergen Reactions  . Ace Inhibitors Other (See Comments)    Other reaction(s): Other (See Comments) High K+  . Codeine     Other reaction(s): Other (See Comments) Hallucinations  . Spironolactone     Increases potassium   Chief complain: lower extremity edema  HPI Steven Shepard is being followed in our clinic for paroxysmal A. fib and difficult to control hypertension. He also is hyperlipidemia which is being followed by Dr.Spear. He states that he has palpitations especially after few glasses of wine but not more than once every couple of months. He does check his blood pressure at home and is normal. He is working out in the gym 2 days a week. Weight has been stable but he has not lost any. He's very compliant with his medications. No chest pain or SOB, but fatigue with exercise. Muscle pain (harmstring), worse at rest. He has been seen by GI for fatty liver and gallbladder polyps.  The patient has elevated blood sugar, he is starting diet under dietitian (his brother in law) and Is exercising on and off.  02/16/2015 - the patient is coming after one year, he states he has been doing okay but in the last couple months gain weight  and hasn't been watching his diet, his blood pressure has been borderline and he has developed lower extremity edema that is worse than usual. He denies any orthopnea, paroxysmal nocturnal dyspnea, chest pain, claudication palpitations or syncope.  PE Blood pressure 142/74, weight 279 pounds, pulse 66. General Appearance: well developed, well nourished in no acute distress, muscular, overweight HEENT: symmetrical face, PERRLA, good dentition  Neck: no JVD, thyromegaly, or adenopathy, trachea midline Chest: symmetric without deformity Cardiac: PMI non-displaced, RRR, normal S1, S2, soft systolic murmur left upper sternal border with normal splitting of S2, no diastolic component Lung: clear to ausculation and percussion Vascular: all pulses full without bruits  Abdominal: nondistended, nontender, good bowel sounds, no HSM, no bruits Extremities: no cyanosis, clubbing, there is 2+ feeding bilateral lower extremity edema up to his mid calves, no sign of DVT, no varicosities  Skin: normal color, no rashes Neuro: alert and oriented x 3, non-focal Pysch: normal affect  EKG Normal sinus rhythm, no acute change.   Home Medications  Prior to Admission medications   Medication Sig Start Date End Date Taking? Authorizing Provider  acetaminophen (TYLENOL) 650 MG CR tablet Take 650 mg by mouth 2 (two) times daily as needed.     Yes Historical Provider, MD  aspirin 81 MG tablet Take 81 mg by mouth daily.  Yes Historical Provider, MD  atorvastatin (LIPITOR) 20 MG tablet Take 20 mg by mouth daily.     Yes Historical Provider, MD  cloNIDine (CATAPRES) 0.3 MG tablet Take 1 tablet (0.3 mg total) by mouth 2 (two) times daily. 06/17/12  Yes Renella Cunas, MD  diltiazem (CARDIZEM CD) 360 MG 24 hr capsule Take 1 capsule (360 mg total) by mouth daily. 06/17/12  Yes Renella Cunas, MD  Fexofenadine HCl (ALLEGRA PO) Take by mouth every morning.   Yes Historical Provider, MD  folic acid (FOLVITE) 505 MCG tablet  Take 400 mcg by mouth 2 (two) times daily.   Yes Historical Provider, MD  metoprolol (TOPROL-XL) 50 MG 24 hr tablet Take 50 mg by mouth 2 (two) times daily.    Yes Historical Provider, MD  Multiple Vitamin (MULTIVITAMIN) capsule Take 1 capsule by mouth daily.     Yes Historical Provider, MD  ranitidine (ZANTAC) 75 MG tablet Take 75 mg by mouth 2 (two) times daily as needed.   Yes Historical Provider, MD  Tamsulosin HCl (FLOMAX) 0.4 MG CAPS Take 0.4 mg by mouth daily.     Yes Historical Provider, MD  tiotropium (SPIRIVA) 18 MCG inhalation capsule Place 18 mcg into inhaler and inhale daily.     Yes Historical Provider, MD  warfarin (COUMADIN) 5 MG tablet TAKE AS DIRECTED BY COUMADIN CLINIC 02/02/13  Yes Dorothy Spark, MD    Family History  Family History  Problem Relation Age of Onset  . Coronary artery disease Mother   . Coronary artery disease Father     Social History  Social History   Social History  . Marital Status: Married    Spouse Name: N/A  . Number of Children: N/A  . Years of Education: N/A   Occupational History  . Realtor    Social History Main Topics  . Smoking status: Former Smoker -- 1.50 packs/day for 3 years    Types: Cigarettes, Pipe    Quit date: 03/12/1974  . Smokeless tobacco: Never Used     Comment: quit appox 36 years ago  . Alcohol Use: Yes     Comment: occasionally  . Drug Use: No  . Sexual Activity: Not on file   Other Topics Concern  . Not on file   Social History Narrative     Review of Systems, as per HPI, otherwise negative General:  No chills, fever, night sweats or weight changes.  Cardiovascular:  No chest pain, dyspnea on exertion, edema, orthopnea, palpitations, paroxysmal nocturnal dyspnea. Dermatological: No rash, lesions/masses Respiratory: No cough, dyspnea Urologic: No hematuria, dysuria Abdominal:   No nausea, vomiting, diarrhea, bright red blood per rectum, melena, or hematemesis Neurologic:  No visual changes, wkns,  changes in mental status. All other systems reviewed and are otherwise negative except as noted above.  Physical Exam  Blood pressure 142/74, pulse 66, height 6' 0.5" (1.842 m), weight 249 lb (112.946 kg).  General: Pleasant, NAD Psych: Normal affect. Neuro: Alert and oriented X 3. Moves all extremities spontaneously. HEENT: Normal  Neck: Supple without bruits or JVD. Lungs:  Resp regular and unlabored, CTA. Heart: RRR no s3, s4, or murmurs. Abdomen: Soft, non-tender, non-distended, BS + x 4.  Extremities: No clubbing, cyanosis or edema. DP/PT/Radials 2+ and equal bilaterally.  Labs:  No results for input(s): CKTOTAL, CKMB, TROPONINI in the last 72 hours. Lab Results  Component Value Date   WBC 6.4 12/25/2013   HGB 13.3 12/25/2013   HCT Unable to Determine  12/25/2013   MCV Unable to Determine 12/25/2013   PLT 190 12/25/2013   Accessory Clinical Findings  Echocardiogram - 09/16/2013 Left ventricle: The cavity size was normal. Wall thickness was increased in a pattern of mild LVH. Systolic function was normal. The estimated ejection fraction was in the range of 55% to 60%. Wall motion was normal; there were no regional wall motion abnormalities. - Left atrium: The atrium was mildly dilated. - Atrial septum: No defect or patent foramen ovale was identified.  ECG - normal sinus rhythm, 66 bpm, normal EKG unchanged from prior.    Assessment & Plan  69 year old male  1. Acute on chronic diastolic CHF - we will increase Lasix to 40 mg by mouth twice a day, increase potassium to 20 mEq daily. Check basic labs with electrolytes and creatinine today in follow-up in 6 weeks. Patient is advised to change his diet and especially limit the salt intake.  2. Hypertension - uncontrolled, veering increasing Lasix to 40 mg orally twice a day  3. Hyperlipidemia - followed by his PCP Dr Greta Doom, normal LFTs  4. Lower extremity pain at rest - normal liver enzymes, normal bilateral  arterial duplex.   Follow-up in 6 weeks, check CMET, CBC W DIFF, TSH, AND BNP today.  Dorothy Spark, MD, [facility] 02/16/2015, 12:20 PM

## 2015-02-16 NOTE — Patient Instructions (Signed)
Medication Instructions:   INCREASE YOUR LASIX TO 40 MG TWICE DAILY---TAKE ONE TABLET AT 8 AM AND ONE TABLET AT 2PM.   INCREASE YOUR POTASSIUM CHLORIDE TO 20 mEq ONCE DAILY   Labwork:  TODAY---CMET, CBC W DIFF, TSH, AND BNP    Follow-Up:  2 MONTHS WITH DR Meda Coffee     If you need a refill on your cardiac medications before your next appointment, please call your pharmacy.

## 2015-02-17 LAB — BRAIN NATRIURETIC PEPTIDE: Brain Natriuretic Peptide: 21.8 pg/mL (ref 0.0–100.0)

## 2015-03-09 ENCOUNTER — Ambulatory Visit (INDEPENDENT_AMBULATORY_CARE_PROVIDER_SITE_OTHER): Payer: Medicare Other | Admitting: Pharmacist

## 2015-03-09 DIAGNOSIS — I480 Paroxysmal atrial fibrillation: Secondary | ICD-10-CM | POA: Diagnosis not present

## 2015-03-09 DIAGNOSIS — Z5181 Encounter for therapeutic drug level monitoring: Secondary | ICD-10-CM

## 2015-03-09 LAB — POCT INR: INR: 2.9

## 2015-03-29 DIAGNOSIS — L570 Actinic keratosis: Secondary | ICD-10-CM | POA: Diagnosis not present

## 2015-03-29 DIAGNOSIS — Z85828 Personal history of other malignant neoplasm of skin: Secondary | ICD-10-CM | POA: Diagnosis not present

## 2015-03-29 DIAGNOSIS — D692 Other nonthrombocytopenic purpura: Secondary | ICD-10-CM | POA: Diagnosis not present

## 2015-04-12 ENCOUNTER — Ambulatory Visit (INDEPENDENT_AMBULATORY_CARE_PROVIDER_SITE_OTHER): Payer: Medicare Other

## 2015-04-12 DIAGNOSIS — I480 Paroxysmal atrial fibrillation: Secondary | ICD-10-CM

## 2015-04-12 DIAGNOSIS — Z5181 Encounter for therapeutic drug level monitoring: Secondary | ICD-10-CM | POA: Diagnosis not present

## 2015-04-12 LAB — POCT INR: INR: 2.8

## 2015-04-13 ENCOUNTER — Telehealth: Payer: Self-pay | Admitting: *Deleted

## 2015-04-13 NOTE — Telephone Encounter (Signed)
Patient called to inform us that he is scheduled for a spinal injection on 04/21/15 by Dr. Sherwood Gambler and he needs to hold his Coumadin/Warfarin.   Called Dr. Marlene Lard office & spoke with Yetta Flock; she stated she would fax over a clearance form. Also, returned a call to the pt to inform him that we have contacted them & that we would be in touch with him soon as we receive the form and he verbalized understanding.

## 2015-04-13 NOTE — Telephone Encounter (Addendum)
Spoke with Mauri Reading, CMA for Dr. Elodia Florence.  She stated they had received cardiac clearance for pt to take last dose of Coumadin on 2/4 and resume on 2/11.  I could not find this documented in our records.  Pt is on Coumadin for Afib and no history of TIA or CVA so this should be fine.  Spoke with pt.  Informed him of this information and rescheduled his next INR for 10 days after restarting Coumadin

## 2015-04-15 DIAGNOSIS — B9689 Other specified bacterial agents as the cause of diseases classified elsewhere: Secondary | ICD-10-CM | POA: Diagnosis not present

## 2015-04-15 DIAGNOSIS — J019 Acute sinusitis, unspecified: Secondary | ICD-10-CM | POA: Diagnosis not present

## 2015-04-20 ENCOUNTER — Ambulatory Visit (INDEPENDENT_AMBULATORY_CARE_PROVIDER_SITE_OTHER): Payer: Medicare Other | Admitting: Cardiology

## 2015-04-20 ENCOUNTER — Encounter: Payer: Self-pay | Admitting: Cardiology

## 2015-04-20 ENCOUNTER — Telehealth: Payer: Self-pay | Admitting: *Deleted

## 2015-04-20 VITALS — BP 110/58 | HR 72 | Ht 72.5 in | Wt 251.0 lb

## 2015-04-20 DIAGNOSIS — I10 Essential (primary) hypertension: Secondary | ICD-10-CM | POA: Diagnosis not present

## 2015-04-20 DIAGNOSIS — I480 Paroxysmal atrial fibrillation: Secondary | ICD-10-CM

## 2015-04-20 DIAGNOSIS — R600 Localized edema: Secondary | ICD-10-CM

## 2015-04-20 NOTE — Telephone Encounter (Signed)
Patient is not having his spinal injection due to having a URI, he has been holding his coumadin since 04/15/2015, we will boost an extra 1/2 tablet tonight, moved his appointment up to 04/27/2015.  Normal Coumadin dose 27m tablet 1/2 tablet every day except 1 tablet on Tuesdays and Thursdays.

## 2015-04-20 NOTE — Progress Notes (Signed)
Patient ID: Steven Shepard, male   DOB: 01-22-1946, 70 y.o.   MRN: [id number]    Patient Name: Steven Shepard Date of Encounter: 04/20/2015  Primary Care Provider:  Charolette Forward, PA-C Primary Cardiologist:  Dorothy Spark  Problem List   Past Medical History  Diagnosis Date  . DOE (dyspnea on exertion)   . Hyperlipidemia   . Hypertension   . Sleep apnea   . History of neck surgery   . Umbilical hernia   . Anemia, hemolytic ([place]) 01/22/2011  . 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 Tonsillectomy    . Skin surgery      skin cancer  . Neck surgery  2005  . Umbilical hernia repair    . Total hip arthroplasty  September 18, 2010    right Dr. Maureen Ralphs    Allergies  Allergies  Allergen Reactions  . Ace Inhibitors Other (See Comments)    Other reaction(s): Other (See Comments) High K+  . Codeine     Other reaction(s): Other (See Comments) Hallucinations  . Spironolactone     Increases potassium   Chief complain: lower extremity edema  HPI Steven Shepard is being followed in our clinic for paroxysmal A. fib and difficult to control hypertension. He also is hyperlipidemia which is being followed by Dr.Spear. He states that he has palpitations especially after few glasses of wine but not more than once every couple of months. He does check his blood pressure at home and is normal. He is working out in the gym 2 days a week. Weight has been stable but he has not lost any. He's very compliant with his medications. No chest pain or SOB, but fatigue with exercise. Muscle pain (harmstring), worse at rest. He has been seen by GI for fatty liver and gallbladder polyps.  The patient has elevated blood sugar, he is starting diet under dietitian (his brother in law) and Is exercising on and off.  04/20/2015 - the patient is coming after 2 months, at the last visit he complaining of lower extremity edema and his Lasix was  increased to 40 mg daily. He states that it has improved somewhat but it's persistent mostly toward the end of the day. He otherwise denies orthopnea, paroxysmal nocturnal dyspnea, dyspnea on exertion or chest pain. He also denies claudications or palpitations or syncope.  PE Blood pressure 142/74, weight 279 pounds, pulse 66. General Appearance: well developed, well nourished in no acute distress, muscular, overweight HEENT: symmetrical face, PERRLA, good dentition  Neck: no JVD, thyromegaly, or adenopathy, trachea midline Chest: symmetric without deformity Cardiac: PMI non-displaced, RRR, normal S1, S2, soft systolic murmur left upper sternal border with normal splitting of S2, no diastolic component Lung: clear to ausculation and percussion Vascular: all pulses full without bruits  Abdominal: nondistended, nontender, good bowel sounds, no HSM, no bruits Extremities: no cyanosis, clubbing, there is 2+ feeding bilateral lower extremity edema up to his mid calves, no sign of DVT, no varicosities  Skin: normal color, no rashes Neuro: alert and oriented x 3, non-focal Pysch: normal affect  EKG Normal sinus rhythm, no acute change.  Home Medications  Prior to Admission medications   Medication Sig Start Date End Date Taking? Authorizing Provider  acetaminophen (TYLENOL) 650 MG CR tablet Take 650 mg by mouth 2 (two) times daily as needed.     Yes Historical Provider, MD  aspirin 81 MG tablet Take 81 mg by mouth daily.  Yes Historical Provider, MD  atorvastatin (LIPITOR) 20 MG tablet Take 20 mg by mouth daily.     Yes Historical Provider, MD  cloNIDine (CATAPRES) 0.3 MG tablet Take 1 tablet (0.3 mg total) by mouth 2 (two) times daily. 06/17/12  Yes Renella Cunas, MD  diltiazem (CARDIZEM CD) 360 MG 24 hr capsule Take 1 capsule (360 mg total) by mouth daily. 06/17/12  Yes Renella Cunas, MD  Fexofenadine HCl (ALLEGRA PO) Take by mouth every morning.   Yes Historical Provider, MD  folic acid  (FOLVITE) 094 MCG tablet Take 400 mcg by mouth 2 (two) times daily.   Yes Historical Provider, MD  metoprolol (TOPROL-XL) 50 MG 24 hr tablet Take 50 mg by mouth 2 (two) times daily.    Yes Historical Provider, MD  Multiple Vitamin (MULTIVITAMIN) capsule Take 1 capsule by mouth daily.     Yes Historical Provider, MD  ranitidine (ZANTAC) 75 MG tablet Take 75 mg by mouth 2 (two) times daily as needed.   Yes Historical Provider, MD  Tamsulosin HCl (FLOMAX) 0.4 MG CAPS Take 0.4 mg by mouth daily.     Yes Historical Provider, MD  tiotropium (SPIRIVA) 18 MCG inhalation capsule Place 18 mcg into inhaler and inhale daily.     Yes Historical Provider, MD  warfarin (COUMADIN) 5 MG tablet TAKE AS DIRECTED BY COUMADIN CLINIC 02/02/13  Yes Dorothy Spark, MD    Family History  Family History  Problem Relation Age of Onset  . Coronary artery disease Mother   . Coronary artery disease Father     Social History  Social History   Social History  . Marital Status: Married    Spouse Name: N/A  . Number of Children: N/A  . Years of Education: N/A   Occupational History  . Realtor    Social History Main Topics  . Smoking status: Former Smoker -- 1.50 packs/day for 3 years    Types: Cigarettes, Pipe    Quit date: 03/12/1974  . Smokeless tobacco: Never Used     Comment: quit appox 36 years ago  . Alcohol Use: Yes     Comment: occasionally  . Drug Use: No  . Sexual Activity: Not on file   Other Topics Concern  . Not on file   Social History Narrative     Review of Systems, as per HPI, otherwise negative General:  No chills, fever, night sweats or weight changes.  Cardiovascular:  No chest pain, dyspnea on exertion, edema, orthopnea, palpitations, paroxysmal nocturnal dyspnea. Dermatological: No rash, lesions/masses Respiratory: No cough, dyspnea Urologic: No hematuria, dysuria Abdominal:   No nausea, vomiting, diarrhea, bright red blood per rectum, melena, or hematemesis Neurologic:   No visual changes, wkns, changes in mental status. All other systems reviewed and are otherwise negative except as noted above.  Physical Exam  Blood pressure 110/58, pulse 72, height 6' 0.5" (1.842 m), weight 251 lb (113.853 kg).  General: Pleasant, NAD Psych: Normal affect. Neuro: Alert and oriented X 3. Moves all extremities spontaneously. HEENT: Normal  Neck: Supple without bruits or JVD. Lungs:  Resp regular and unlabored, CTA. Heart: RRR no s3, s4, or murmurs. Abdomen: Soft, non-tender, non-distended, BS + x 4.  Extremities: No clubbing, cyanosis, mild bilateral lower extremity edema. DP/PT/Radials 2+ and equal bilaterally.  Labs:  No results for input(s): CKTOTAL, CKMB, TROPONINI in the last 72 hours. Lab Results  Component Value Date   WBC 6.9 02/16/2015   HGB 13.7 02/16/2015   HCT  38.6* 02/16/2015   MCV 93.7 02/16/2015   PLT 246 02/16/2015   Accessory Clinical Findings  Echocardiogram - 09/16/2013 Left ventricle: The cavity size was normal. Wall thickness was increased in a pattern of mild LVH. Systolic function was normal. The estimated ejection fraction was in the range of 55% to 60%. Wall motion was normal; there were no regional wall motion abnormalities. - Left atrium: The atrium was mildly dilated. - Atrial septum: No defect or patent foramen ovale was identified.  ECG - normal sinus rhythm, 66 bpm, normal EKG unchanged from prior.    Assessment & Plan  70 year old male  1. Lower extremity edema - no other evidence of heart failure, BNP checked at the last visit 20. He's advised to cut down on salt, elevate his legs as much as possible continue taking Lasix and use compression stockings. This doesn't seem to be related to his heart.  2. Hypertension - controlled after increasing Lasix to 40 mg orally twice a day  3. Hyperlipidemia - followed by his PCP Dr Greta Doom, normal LFTs  4. Lower extremity pain at rest - normal liver enzymes, normal  bilateral arterial duplex.  5. Paroxysmal A. Fib - currently in sinus rhythm on chronic anticoagulation with warfarin.  Follow-up in 6 months.  Dorothy Spark, MD, [facility] 04/20/2015, 9:49 AM

## 2015-04-20 NOTE — Patient Instructions (Signed)
Your physician recommends that you continue on your current medications as directed. Please refer to the Current Medication list given to you today.     Your physician wants you to follow-up in: [place] will receive a reminder letter in the mail two months in advance. If you don't receive a letter, please call our office to schedule the follow-up appointment.

## 2015-04-22 DIAGNOSIS — J019 Acute sinusitis, unspecified: Secondary | ICD-10-CM | POA: Diagnosis not present

## 2015-04-22 DIAGNOSIS — B9689 Other specified bacterial agents as the cause of diseases classified elsewhere: Secondary | ICD-10-CM | POA: Diagnosis not present

## 2015-04-27 ENCOUNTER — Ambulatory Visit (INDEPENDENT_AMBULATORY_CARE_PROVIDER_SITE_OTHER): Payer: Medicare Other | Admitting: *Deleted

## 2015-04-27 DIAGNOSIS — I480 Paroxysmal atrial fibrillation: Secondary | ICD-10-CM

## 2015-04-27 DIAGNOSIS — Z5181 Encounter for therapeutic drug level monitoring: Secondary | ICD-10-CM | POA: Diagnosis not present

## 2015-04-27 LAB — POCT INR: INR: 1.6

## 2015-05-05 ENCOUNTER — Other Ambulatory Visit: Payer: Self-pay | Admitting: Cardiology

## 2015-05-06 ENCOUNTER — Telehealth: Payer: Self-pay

## 2015-05-06 NOTE — Telephone Encounter (Signed)
Refill request received from [facility] on [address] for atorvastatin. Per Dr. Francesca Oman last [place] on 04/20/15, pt's lipids are followed by his PCP. I called pt & informed him that he would need to contact his PCP for refills. Pt verbalized understanding.

## 2015-05-07 ENCOUNTER — Other Ambulatory Visit: Payer: Self-pay | Admitting: Cardiology

## 2015-05-09 ENCOUNTER — Other Ambulatory Visit: Payer: Self-pay | Admitting: Cardiology

## 2015-05-11 ENCOUNTER — Ambulatory Visit (INDEPENDENT_AMBULATORY_CARE_PROVIDER_SITE_OTHER): Payer: Medicare Other | Admitting: *Deleted

## 2015-05-11 DIAGNOSIS — I480 Paroxysmal atrial fibrillation: Secondary | ICD-10-CM | POA: Diagnosis not present

## 2015-05-11 DIAGNOSIS — Z5181 Encounter for therapeutic drug level monitoring: Secondary | ICD-10-CM | POA: Diagnosis not present

## 2015-05-11 LAB — POCT INR: INR: 2.5

## 2015-05-12 ENCOUNTER — Other Ambulatory Visit: Payer: Self-pay | Admitting: Cardiology

## 2015-05-21 DIAGNOSIS — IMO0002 Reserved for concepts with insufficient information to code with codable children: Secondary | ICD-10-CM | POA: Insufficient documentation

## 2015-05-21 DIAGNOSIS — M1710 Unilateral primary osteoarthritis, unspecified knee: Secondary | ICD-10-CM | POA: Insufficient documentation

## 2015-05-21 DIAGNOSIS — M169 Osteoarthritis of hip, unspecified: Secondary | ICD-10-CM | POA: Insufficient documentation

## 2015-05-24 DIAGNOSIS — E6609 Other obesity due to excess calories: Secondary | ICD-10-CM | POA: Diagnosis not present

## 2015-05-24 DIAGNOSIS — I10 Essential (primary) hypertension: Secondary | ICD-10-CM | POA: Diagnosis not present

## 2015-05-24 DIAGNOSIS — N400 Enlarged prostate without lower urinary tract symptoms: Secondary | ICD-10-CM | POA: Diagnosis not present

## 2015-05-24 DIAGNOSIS — I480 Paroxysmal atrial fibrillation: Secondary | ICD-10-CM | POA: Diagnosis not present

## 2015-05-24 DIAGNOSIS — E782 Mixed hyperlipidemia: Secondary | ICD-10-CM | POA: Diagnosis not present

## 2015-05-26 DIAGNOSIS — I10 Essential (primary) hypertension: Secondary | ICD-10-CM | POA: Diagnosis not present

## 2015-05-26 DIAGNOSIS — E782 Mixed hyperlipidemia: Secondary | ICD-10-CM | POA: Diagnosis not present

## 2015-06-02 ENCOUNTER — Ambulatory Visit: Payer: Medicare Other | Admitting: Pulmonary Disease

## 2015-06-08 ENCOUNTER — Telehealth: Payer: Self-pay | Admitting: Pharmacist

## 2015-06-08 ENCOUNTER — Encounter: Payer: Self-pay | Admitting: Pulmonary Disease

## 2015-06-08 ENCOUNTER — Ambulatory Visit (INDEPENDENT_AMBULATORY_CARE_PROVIDER_SITE_OTHER): Payer: Medicare Other | Admitting: Pulmonary Disease

## 2015-06-08 ENCOUNTER — Ambulatory Visit (INDEPENDENT_AMBULATORY_CARE_PROVIDER_SITE_OTHER): Payer: Medicare Other | Admitting: *Deleted

## 2015-06-08 VITALS — BP 128/80 | HR 54 | Ht 73.0 in | Wt 254.6 lb

## 2015-06-08 DIAGNOSIS — Z5181 Encounter for therapeutic drug level monitoring: Secondary | ICD-10-CM | POA: Diagnosis not present

## 2015-06-08 DIAGNOSIS — I480 Paroxysmal atrial fibrillation: Secondary | ICD-10-CM | POA: Diagnosis not present

## 2015-06-08 DIAGNOSIS — G4733 Obstructive sleep apnea (adult) (pediatric): Secondary | ICD-10-CM | POA: Diagnosis not present

## 2015-06-08 DIAGNOSIS — J449 Chronic obstructive pulmonary disease, unspecified: Secondary | ICD-10-CM

## 2015-06-08 LAB — POCT INR: INR: 2.3

## 2015-06-08 NOTE — Patient Instructions (Signed)
Your CPAP is set at 9 cm CPAP supplies will be renewed Call as needed

## 2015-06-08 NOTE — Telephone Encounter (Signed)
Received fax from [facility] that pt is scheduled for a lumbar injection with Dr. Jovita Gamma on 06/16/15. Pt on Coumadin for afib, CHADS2 score of 3 with no history of stroke or TIA. Ok to hold Coumadin x5 days prior to procedure. Clearance form faxed on 3/29 to #[phone].

## 2015-06-08 NOTE — Assessment & Plan Note (Signed)
stable °

## 2015-06-08 NOTE — Assessment & Plan Note (Signed)
Your CPAP is set at 9 cm CPAP supplies will be renewed Call as needed  Weight loss encouraged, compliance with goal of at least 4-6 hrs every night is the expectation. Advised against medications with sedative side effects Cautioned against driving when sleepy - understanding that sleepiness will vary on a day to day basis

## 2015-06-08 NOTE — Progress Notes (Signed)
   Subjective:    Patient ID: Steven Shepard, male    DOB: 12-04-1945, 70 y.o.   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    06/08/2015  Chief Complaint  Patient presents with  . Follow-up    doing well on CPAP.  Discuss mini travel size CPAP.  No concerns.    He is tolerating cpap well, feels more refreshed, no leak, mask ok, pressure ok  Download 05/2015 >> on 9 cm, no residuals, good usage Off spiriva & allegra A. Fib has been rate controlled, remains on coumadin,  On metoprolol, cardizem & clonidine for rate control  On Lasix 74mdaily for Has mild peripheral edema   Significant tests/ events  PSG 09/2010  severe obstructive sleep apnea with AHI 86/h corrected by CPAP 9 cm  Maintained on CPAP 9cm, small nasal comfort gel mask,   2010 Spirometry FEV1 83%, ratio 69  Review of Systems Patient denies significant dyspnea,cough, hemoptysis,  chest pain, palpitations, pedal edema, orthopnea, paroxysmal nocturnal dyspnea, lightheadedness, nausea, vomiting, abdominal or  leg pains      Objective:   Physical Exam  Gen. Pleasant, well-nourished, in no distress ENT - no lesions, no post nasal drip Neck: No JVD, no thyromegaly, no carotid bruits Lungs: no use of accessory muscles, no dullness to percussion, clear without rales or rhonchi  Cardiovascular: Rhythm regular, heart sounds  normal, no murmurs or gallops, no peripheral edema Musculoskeletal: No deformities, no cyanosis or clubbing        Assessment & Plan:

## 2015-06-08 NOTE — Telephone Encounter (Addendum)
Spoke with Steven Shepard with [facility]. They request that pt be off Coumadin for 7 days rather than 5. He held for 7 days for the same injection a month ago. Resent clearance form. Hopefully pt will not need injections every month since holding 7 days results in him having a subtherapeutic INR for half of every month - will monitor. Pt aware last dose of Coumadin is tonight. He is scheduled for INR check the morning of spinal injection.

## 2015-06-16 ENCOUNTER — Ambulatory Visit (INDEPENDENT_AMBULATORY_CARE_PROVIDER_SITE_OTHER): Payer: Medicare Other

## 2015-06-16 DIAGNOSIS — Z5181 Encounter for therapeutic drug level monitoring: Secondary | ICD-10-CM

## 2015-06-16 DIAGNOSIS — I480 Paroxysmal atrial fibrillation: Secondary | ICD-10-CM

## 2015-06-16 DIAGNOSIS — M5136 Other intervertebral disc degeneration, lumbar region: Secondary | ICD-10-CM | POA: Diagnosis not present

## 2015-06-16 DIAGNOSIS — M4806 Spinal stenosis, lumbar region: Secondary | ICD-10-CM | POA: Diagnosis not present

## 2015-06-16 DIAGNOSIS — M4726 Other spondylosis with radiculopathy, lumbar region: Secondary | ICD-10-CM | POA: Diagnosis not present

## 2015-06-16 LAB — POCT INR: INR: 1.1

## 2015-06-23 ENCOUNTER — Other Ambulatory Visit: Payer: Self-pay | Admitting: Cardiology

## 2015-06-23 ENCOUNTER — Ambulatory Visit (INDEPENDENT_AMBULATORY_CARE_PROVIDER_SITE_OTHER): Payer: Medicare Other | Admitting: *Deleted

## 2015-06-23 DIAGNOSIS — Z5181 Encounter for therapeutic drug level monitoring: Secondary | ICD-10-CM

## 2015-06-23 DIAGNOSIS — I480 Paroxysmal atrial fibrillation: Secondary | ICD-10-CM | POA: Diagnosis not present

## 2015-06-23 LAB — POCT INR: INR: 1.2

## 2015-06-28 DIAGNOSIS — D2361 Other benign neoplasm of skin of right upper limb, including shoulder: Secondary | ICD-10-CM | POA: Diagnosis not present

## 2015-06-28 DIAGNOSIS — Z85828 Personal history of other malignant neoplasm of skin: Secondary | ICD-10-CM | POA: Diagnosis not present

## 2015-06-28 DIAGNOSIS — L570 Actinic keratosis: Secondary | ICD-10-CM | POA: Diagnosis not present

## 2015-06-30 ENCOUNTER — Ambulatory Visit (INDEPENDENT_AMBULATORY_CARE_PROVIDER_SITE_OTHER): Payer: Medicare Other | Admitting: *Deleted

## 2015-06-30 ENCOUNTER — Telehealth: Payer: Self-pay | Admitting: *Deleted

## 2015-06-30 DIAGNOSIS — I480 Paroxysmal atrial fibrillation: Secondary | ICD-10-CM | POA: Diagnosis not present

## 2015-06-30 DIAGNOSIS — I5031 Acute diastolic (congestive) heart failure: Secondary | ICD-10-CM

## 2015-06-30 DIAGNOSIS — Z5181 Encounter for therapeutic drug level monitoring: Secondary | ICD-10-CM | POA: Diagnosis not present

## 2015-06-30 LAB — POCT INR: INR: 3.1

## 2015-06-30 MED ORDER — POTASSIUM CHLORIDE CRYS ER 20 MEQ PO TBCR: 20.0000 meq | EXTENDED_RELEASE_TABLET | Freq: Two times a day (BID) | ORAL | Status: DC

## 2015-06-30 NOTE — Telephone Encounter (Signed)
Spoke with the pt to confirm how much K-Dur he is taking on a daily basis, and per the pt, he states that he saw Dr Meda Coffee in the office today, when he came for his coumadin appt, and he confirmed with her that he should be taking K-dur 20 mEq po BID.  Pt is on lasix 40 mg po BID, and taking K-dur 20 mEq po bid would be equal to his diuretic dose.  Confirmed the pharmacy of choice with the pt.  Sent in a 90 day supply, as pt requested.  Pt gracious for all the assistance provided.

## 2015-06-30 NOTE — Telephone Encounter (Signed)
Per message left on the refill voicemail patient stated that he is now taking potassium 24mq bid. At 02/16/15 office visit, he was instructed to increase to 231m daily. Please advise as to what current therapy should be. Thanks, MI

## 2015-07-01 ENCOUNTER — Encounter: Payer: Self-pay | Admitting: Pulmonary Disease

## 2015-07-07 DIAGNOSIS — G4730 Sleep apnea, unspecified: Secondary | ICD-10-CM | POA: Diagnosis not present

## 2015-07-07 DIAGNOSIS — I10 Essential (primary) hypertension: Secondary | ICD-10-CM | POA: Diagnosis not present

## 2015-07-07 DIAGNOSIS — R072 Precordial pain: Secondary | ICD-10-CM | POA: Diagnosis not present

## 2015-07-07 DIAGNOSIS — E785 Hyperlipidemia, unspecified: Secondary | ICD-10-CM | POA: Diagnosis not present

## 2015-07-07 DIAGNOSIS — I482 Chronic atrial fibrillation: Secondary | ICD-10-CM | POA: Diagnosis not present

## 2015-07-07 DIAGNOSIS — Z7901 Long term (current) use of anticoagulants: Secondary | ICD-10-CM | POA: Diagnosis not present

## 2015-07-07 DIAGNOSIS — R002 Palpitations: Secondary | ICD-10-CM | POA: Diagnosis not present

## 2015-07-07 DIAGNOSIS — I517 Cardiomegaly: Secondary | ICD-10-CM | POA: Diagnosis not present

## 2015-07-07 DIAGNOSIS — I4891 Unspecified atrial fibrillation: Secondary | ICD-10-CM | POA: Diagnosis not present

## 2015-07-14 ENCOUNTER — Ambulatory Visit (INDEPENDENT_AMBULATORY_CARE_PROVIDER_SITE_OTHER): Payer: Medicare Other

## 2015-07-14 ENCOUNTER — Telehealth: Payer: Self-pay

## 2015-07-14 DIAGNOSIS — Z5181 Encounter for therapeutic drug level monitoring: Secondary | ICD-10-CM | POA: Diagnosis not present

## 2015-07-14 DIAGNOSIS — I480 Paroxysmal atrial fibrillation: Secondary | ICD-10-CM

## 2015-07-14 LAB — POCT INR: INR: 3.4

## 2015-07-14 NOTE — Telephone Encounter (Signed)
Pt seen in [facility] today for scheduled f/u appt.  Pt states while in [place] he experienced onset of heart fluttering, an hour after onset HR and palpitations increased per pt report, pt went by EMS to ED. Pt was in afib, BP elevated, pt was given IV Diltiazem in the ED, stayed 1-2 hours and then was released home.  On discharge paperwork pt was advised to contact and f/u with cardiologist once returns home to [place].  Pt brought in copy of labwork, Chest xray report and EKG from ED visit in [facility] for Dr Meda Coffee to review.  Please call pt [phone] to follow-up once reviewed.  Thanks.

## 2015-07-14 NOTE — Telephone Encounter (Signed)
This is know PAF, he is already on Coumadin, follow up as scheduled unless he has another a-fib episode.

## 2015-07-14 NOTE — Telephone Encounter (Signed)
Will send this message to Dr Meda Coffee to make her aware of this, as well as hand her documentation pt provided, in regards to this incident.

## 2015-07-14 NOTE — Telephone Encounter (Signed)
Notified the pt that per Dr Meda Coffee, he has known PAF, he is already on Coumadin, and she recommends that he follow-up as planned for 5 months out, unless he has another a-fib episode, then he should notify us back.  Pt verbalized understanding, agrees with this plan, and gracious for all the assistance provided.  Pt states he's feeling much better now.

## 2015-07-20 ENCOUNTER — Encounter: Payer: Self-pay | Admitting: Cardiology

## 2015-07-28 ENCOUNTER — Ambulatory Visit (INDEPENDENT_AMBULATORY_CARE_PROVIDER_SITE_OTHER): Payer: Medicare Other

## 2015-07-28 DIAGNOSIS — Z5181 Encounter for therapeutic drug level monitoring: Secondary | ICD-10-CM | POA: Diagnosis not present

## 2015-07-28 DIAGNOSIS — I480 Paroxysmal atrial fibrillation: Secondary | ICD-10-CM

## 2015-07-28 LAB — POCT INR: INR: 2.2

## 2015-08-04 DIAGNOSIS — H04123 Dry eye syndrome of bilateral lacrimal glands: Secondary | ICD-10-CM | POA: Diagnosis not present

## 2015-08-17 ENCOUNTER — Other Ambulatory Visit: Payer: Self-pay | Admitting: Cardiology

## 2015-08-18 ENCOUNTER — Ambulatory Visit (INDEPENDENT_AMBULATORY_CARE_PROVIDER_SITE_OTHER): Payer: Medicare Other | Admitting: Pharmacist

## 2015-08-18 DIAGNOSIS — I480 Paroxysmal atrial fibrillation: Secondary | ICD-10-CM

## 2015-08-18 DIAGNOSIS — Z5181 Encounter for therapeutic drug level monitoring: Secondary | ICD-10-CM

## 2015-08-18 LAB — POCT INR: INR: 2.1

## 2015-08-29 ENCOUNTER — Other Ambulatory Visit: Payer: Self-pay | Admitting: *Deleted

## 2015-08-29 MED ORDER — METOPROLOL SUCCINATE ER 50 MG PO TB24: 50.0000 mg | ORAL_TABLET | Freq: Two times a day (BID) | ORAL | Status: DC

## 2015-09-22 ENCOUNTER — Encounter (INDEPENDENT_AMBULATORY_CARE_PROVIDER_SITE_OTHER): Payer: Self-pay

## 2015-09-22 ENCOUNTER — Ambulatory Visit (INDEPENDENT_AMBULATORY_CARE_PROVIDER_SITE_OTHER): Payer: Medicare Other | Admitting: *Deleted

## 2015-09-22 DIAGNOSIS — I480 Paroxysmal atrial fibrillation: Secondary | ICD-10-CM | POA: Diagnosis not present

## 2015-09-22 DIAGNOSIS — Z5181 Encounter for therapeutic drug level monitoring: Secondary | ICD-10-CM | POA: Diagnosis not present

## 2015-09-22 LAB — POCT INR: INR: 2.3

## 2015-09-27 DIAGNOSIS — S129XXA Fracture of neck, unspecified, initial encounter: Secondary | ICD-10-CM | POA: Diagnosis not present

## 2015-09-27 DIAGNOSIS — M5136 Other intervertebral disc degeneration, lumbar region: Secondary | ICD-10-CM | POA: Diagnosis not present

## 2015-09-27 DIAGNOSIS — M4722 Other spondylosis with radiculopathy, cervical region: Secondary | ICD-10-CM | POA: Diagnosis not present

## 2015-09-27 DIAGNOSIS — M5030 Other cervical disc degeneration, unspecified cervical region: Secondary | ICD-10-CM | POA: Diagnosis not present

## 2015-09-27 DIAGNOSIS — M542 Cervicalgia: Secondary | ICD-10-CM | POA: Diagnosis not present

## 2015-09-27 DIAGNOSIS — Z6833 Body mass index (BMI) 33.0-33.9, adult: Secondary | ICD-10-CM | POA: Diagnosis not present

## 2015-09-27 DIAGNOSIS — M4726 Other spondylosis with radiculopathy, lumbar region: Secondary | ICD-10-CM | POA: Diagnosis not present

## 2015-09-27 DIAGNOSIS — M4316 Spondylolisthesis, lumbar region: Secondary | ICD-10-CM | POA: Diagnosis not present

## 2015-09-27 DIAGNOSIS — M4806 Spinal stenosis, lumbar region: Secondary | ICD-10-CM | POA: Diagnosis not present

## 2015-10-25 DIAGNOSIS — K8020 Calculus of gallbladder without cholecystitis without obstruction: Secondary | ICD-10-CM | POA: Diagnosis not present

## 2015-10-25 DIAGNOSIS — Z7901 Long term (current) use of anticoagulants: Secondary | ICD-10-CM | POA: Diagnosis not present

## 2015-10-25 DIAGNOSIS — K7581 Nonalcoholic steatohepatitis (NASH): Secondary | ICD-10-CM | POA: Diagnosis not present

## 2015-10-25 DIAGNOSIS — I480 Paroxysmal atrial fibrillation: Secondary | ICD-10-CM | POA: Diagnosis not present

## 2015-11-03 ENCOUNTER — Encounter (INDEPENDENT_AMBULATORY_CARE_PROVIDER_SITE_OTHER): Payer: Self-pay

## 2015-11-03 ENCOUNTER — Ambulatory Visit (INDEPENDENT_AMBULATORY_CARE_PROVIDER_SITE_OTHER): Payer: Medicare Other | Admitting: *Deleted

## 2015-11-03 DIAGNOSIS — I480 Paroxysmal atrial fibrillation: Secondary | ICD-10-CM | POA: Diagnosis not present

## 2015-11-03 DIAGNOSIS — Z5181 Encounter for therapeutic drug level monitoring: Secondary | ICD-10-CM | POA: Diagnosis not present

## 2015-11-03 LAB — POCT INR: INR: 2.3

## 2015-11-15 ENCOUNTER — Encounter: Payer: Self-pay | Admitting: Cardiology

## 2015-11-15 DIAGNOSIS — Z23 Encounter for immunization: Secondary | ICD-10-CM | POA: Diagnosis not present

## 2015-11-15 DIAGNOSIS — E6609 Other obesity due to excess calories: Secondary | ICD-10-CM | POA: Diagnosis not present

## 2015-11-15 DIAGNOSIS — E782 Mixed hyperlipidemia: Secondary | ICD-10-CM | POA: Diagnosis not present

## 2015-11-15 DIAGNOSIS — N400 Enlarged prostate without lower urinary tract symptoms: Secondary | ICD-10-CM | POA: Diagnosis not present

## 2015-11-15 DIAGNOSIS — R7301 Impaired fasting glucose: Secondary | ICD-10-CM | POA: Insufficient documentation

## 2015-11-15 DIAGNOSIS — I10 Essential (primary) hypertension: Secondary | ICD-10-CM | POA: Diagnosis not present

## 2015-11-16 ENCOUNTER — Other Ambulatory Visit: Payer: Self-pay | Admitting: Cardiology

## 2015-11-30 ENCOUNTER — Encounter: Payer: Self-pay | Admitting: *Deleted

## 2015-11-30 ENCOUNTER — Ambulatory Visit (INDEPENDENT_AMBULATORY_CARE_PROVIDER_SITE_OTHER): Payer: Medicare Other | Admitting: *Deleted

## 2015-11-30 ENCOUNTER — Ambulatory Visit (INDEPENDENT_AMBULATORY_CARE_PROVIDER_SITE_OTHER): Payer: Medicare Other | Admitting: Cardiology

## 2015-11-30 VITALS — BP 130/80 | HR 63 | Ht 73.0 in | Wt 255.0 lb

## 2015-11-30 DIAGNOSIS — Z5181 Encounter for therapeutic drug level monitoring: Secondary | ICD-10-CM | POA: Diagnosis not present

## 2015-11-30 DIAGNOSIS — I358 Other nonrheumatic aortic valve disorders: Secondary | ICD-10-CM

## 2015-11-30 DIAGNOSIS — I110 Hypertensive heart disease with heart failure: Secondary | ICD-10-CM | POA: Diagnosis not present

## 2015-11-30 DIAGNOSIS — I480 Paroxysmal atrial fibrillation: Secondary | ICD-10-CM | POA: Diagnosis not present

## 2015-11-30 DIAGNOSIS — E785 Hyperlipidemia, unspecified: Secondary | ICD-10-CM | POA: Diagnosis not present

## 2015-11-30 DIAGNOSIS — R600 Localized edema: Secondary | ICD-10-CM

## 2015-11-30 DIAGNOSIS — R011 Cardiac murmur, unspecified: Secondary | ICD-10-CM | POA: Diagnosis not present

## 2015-11-30 LAB — POCT INR: INR: 2.2

## 2015-11-30 NOTE — Progress Notes (Signed)
Patient ID: Steven Shepard, male   DOB: [birth year], 70 y.o.   MRN: [id number]    Patient Name: Steven Shepard Date of Encounter: 11/30/2015  Primary Care Provider:  Charolette Forward, PA-C Primary Cardiologist:  Ena Dawley  Problem List   Past Medical History:  Diagnosis Date  . Anemia, hemolytic ([place]) 01/22/2011  . Atrial fibrillation ([place])   . 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 COPD (chronic obstructive pulmonary disease) ([place])   . DOE (dyspnea on exertion)   . History of neck surgery   . Hyperlipidemia   . Hypertension   . OSA on CPAP   . Umbilical hernia    Past Surgical History:  Procedure Laterality Date  . ADENOIDECTOMY    . HIATAL HERNIA REPAIR  2011  . NECK SURGERY  2005  . SKIN SURGERY     skin cancer  . TONSILLECTOMY    . TOTAL HIP ARTHROPLASTY Right 09/18/2010   Dr. Maureen Ralphs    Allergies  Allergies  Allergen Reactions  . Ace Inhibitors Other (See Comments)    Other reaction(s): Other (See Comments) High K+  . Codeine     Other reaction(s): Other (See Comments) Hallucinations  . Spironolactone     Increases potassium   Chief complain: lower extremity edema  HPI Mr [name] is being followed in our clinic for paroxysmal A. fib and difficult to control hypertension. He also is hyperlipidemia which is being followed by Dr.Spear. He states that he has palpitations especially after few glasses of wine but not more than once every couple of months. He does check his blood pressure at home and is normal. He is working out in the gym 2 days a week. Weight has been stable but he has not lost any. He's very compliant with his medications. No chest pain or SOB, but fatigue with exercise. Muscle pain (harmstring), worse at rest. He has been seen by GI for fatty liver and gallbladder polyps.  The patient has elevated blood sugar, he is starting diet under dietitian (his brother in law) and Is exercising on and off.  12/02/2015 - This is 6  months follow-up, patient states that he has no chest pain shortness of breath palpitations or syncope. He has occasional lower extremity edema mostly toward the end of the day.  No recent palpitations. No orthopnea or proximal nocturnal dyspnea. He has been compliant with his meds. He admits that his diet hasn't been ideal, and that he doesn't exercise.   PE Blood pressure 142/74, weight 279 pounds, pulse 66. General Appearance: well developed, well nourished in no acute distress, muscular, overweight HEENT: symmetrical face, PERRLA, good dentition  Neck: no JVD, thyromegaly, or adenopathy, trachea midline Chest: symmetric without deformity Cardiac: PMI non-displaced, RRR, normal S1, S2, soft systolic murmur left upper sternal border with normal splitting of S2, no diastolic component Lung: clear to ausculation and percussion Vascular: all pulses full without bruits  Abdominal: nondistended, nontender, good bowel sounds, no HSM, no bruits Extremities: no cyanosis, clubbing, there is 2+ feeding bilateral lower extremity edema up to his mid calves, no sign of DVT, no varicosities  Skin: normal color, no rashes Neuro: alert and oriented x 3, non-focal Pysch: normal affect  EKG Normal sinus rhythm, no acute change.  Home Medications  Prior to Admission medications   Medication Sig Start Date End Date Taking? Authorizing Provider  acetaminophen (TYLENOL) 650 MG CR tablet Take 650 mg by mouth 2 (two) times daily as needed.     Yes  Historical Provider, MD  aspirin 81 MG tablet Take 81 mg by mouth daily.     Yes Historical Provider, MD  atorvastatin (LIPITOR) 20 MG tablet Take 20 mg by mouth daily.     Yes Historical Provider, MD  cloNIDine (CATAPRES) 0.3 MG tablet Take 1 tablet (0.3 mg total) by mouth 2 (two) times daily. 06/17/12  Yes Renella Cunas, MD  diltiazem (CARDIZEM CD) 360 MG 24 hr capsule Take 1 capsule (360 mg total) by mouth daily. 06/17/12  Yes Renella Cunas, MD  Fexofenadine HCl  (ALLEGRA PO) Take by mouth every morning.   Yes Historical Provider, MD  folic acid (FOLVITE) 256 MCG tablet Take 400 mcg by mouth 2 (two) times daily.   Yes Historical Provider, MD  metoprolol (TOPROL-XL) 50 MG 24 hr tablet Take 50 mg by mouth 2 (two) times daily.    Yes Historical Provider, MD  Multiple Vitamin (MULTIVITAMIN) capsule Take 1 capsule by mouth daily.     Yes Historical Provider, MD  ranitidine (ZANTAC) 75 MG tablet Take 75 mg by mouth 2 (two) times daily as needed.   Yes Historical Provider, MD  Tamsulosin HCl (FLOMAX) 0.4 MG CAPS Take 0.4 mg by mouth daily.     Yes Historical Provider, MD  tiotropium (SPIRIVA) 18 MCG inhalation capsule Place 18 mcg into inhaler and inhale daily.     Yes Historical Provider, MD  warfarin (COUMADIN) 5 MG tablet TAKE AS DIRECTED BY COUMADIN CLINIC 02/02/13  Yes Dorothy Spark, MD    Family History  Family History  Problem Relation Age of Onset  . Coronary artery disease Mother   . Hypertension Mother   . Coronary artery disease Father   . Hypertension Father   . Healthy Sister   . Liver disease Brother   . Diabetes Brother   . Diabetes Sister   . Hypertension Sister   . Colon cancer Neg Hx   . Colonic polyp Neg Hx     Social History  Social History   Social History  . Marital status: Married    Spouse name: N/A  . Number of children: N/A  . Years of education: N/A   Occupational History  . [employer]   Social History Main Topics  . Smoking status: Former Smoker    Packs/day: 1.50    Years: 3.00    Types: Cigarettes, Pipe    Quit date: 03/12/1973  . Smokeless tobacco: Never Used     Comment: quit appox 36 years ago  . Alcohol use Yes     Comment: occasionally  . Drug use: No  . Sexual activity: Not on file   Other Topics Concern  . Not on file   Social History Narrative  . No narrative on file     Review of Systems, as per HPI, otherwise negative General:  No chills, fever, night sweats or weight  changes.  Cardiovascular:  No chest pain, dyspnea on exertion, edema, orthopnea, palpitations, paroxysmal nocturnal dyspnea. Dermatological: No rash, lesions/masses Respiratory: No cough, dyspnea Urologic: No hematuria, dysuria Abdominal:   No nausea, vomiting, diarrhea, bright red blood per rectum, melena, or hematemesis Neurologic:  No visual changes, wkns, changes in mental status. All other systems reviewed and are otherwise negative except as noted above.  Physical Exam  Blood pressure 130/80, pulse 63, height 6' 1"  (1.854 m), weight 255 lb (115.7 kg).  General: Pleasant, NAD Psych: Normal affect. Neuro: Alert and oriented X 3. Moves all extremities spontaneously. HEENT:  Normal  Neck: Supple without bruits or JVD. Lungs:  Resp regular and unlabored, CTA. Heart: RRR no s3, s4, or murmurs. Abdomen: Soft, non-tender, non-distended, BS + x 4.  Extremities: No clubbing, cyanosis, mild bilateral lower extremity edema. DP/PT/Radials 2+ and equal bilaterally.  Labs:  No results for input(s): CKTOTAL, CKMB, TROPONINI in the last 72 hours. Lab Results  Component Value Date   WBC 6.9 02/16/2015   HGB 13.7 02/16/2015   HCT 38.6 (L) 02/16/2015   MCV 93.7 02/16/2015   PLT 246 02/16/2015   Accessory Clinical Findings  Echocardiogram - 09/16/2013 Left ventricle: The cavity size was normal. Wall thickness was increased in a pattern of mild LVH. Systolic function was normal. The estimated ejection fraction was in the range of 55% to 60%. Wall motion was normal; there were no regional wall motion abnormalities. - Left atrium: The atrium was mildly dilated. - Atrial septum: No defect or patent foramen ovale was identified.  ECG - normal sinus rhythm, 66 bpm, normal EKG unchanged from prior.    Assessment & Plan  70 year old male  1. Lower extremity edema - no other evidence of heart failure, BNP checked at the last visit 20. He's advised to cut down on salt, elevate his  legs as much as possible continue taking Lasix and use compression stockings. This doesn't seem to be related to his heart. Advised about low sodium diet.  2. Hypertension - controlled after increasing Lasix to 40 mg orally twice a day  3. Hyperlipidemia - followed by his PCP Dr Greta Doom, on atorvastatin 20 mg daily, normal LFTs  4. Paroxysmal A. Fib - currently in sinus rhythm on chronic anticoagulation with warfarin. No bleeding, he admits that his episode of A. fib was related to drinking, he is advised about complete avoidance of binge drinking.  Follow-up in 6 months.  Ena Dawley, MD, [facility] 11/30/2015, 9:20 AM

## 2015-11-30 NOTE — Patient Instructions (Signed)
Medication Instructions:   Your physician recommends that you continue on your current medications as directed. Please refer to the Current Medication list given to you today.    Testing/Procedures:  Your physician has requested that you have an echocardiogram. Echocardiography is a painless test that uses sound waves to create images of your heart. It provides your doctor with information about the size and shape of your heart and how well your heart's chambers and valves are working. This procedure takes approximately one hour. There are no restrictions for this procedure.    Follow-Up:  Your physician wants you to follow-up in: [place] will receive a reminder letter in the mail two months in advance. If you don't receive a letter, please call our office to schedule the follow-up appointment.      If you need a refill on your cardiac medications before your next appointment, please call your pharmacy.

## 2015-12-13 ENCOUNTER — Ambulatory Visit (HOSPITAL_COMMUNITY): Payer: Medicare Other | Attending: Cardiovascular Disease

## 2015-12-13 ENCOUNTER — Encounter (INDEPENDENT_AMBULATORY_CARE_PROVIDER_SITE_OTHER): Payer: Self-pay

## 2015-12-13 ENCOUNTER — Other Ambulatory Visit: Payer: Self-pay

## 2015-12-13 DIAGNOSIS — I110 Hypertensive heart disease with heart failure: Secondary | ICD-10-CM | POA: Insufficient documentation

## 2015-12-13 DIAGNOSIS — E785 Hyperlipidemia, unspecified: Secondary | ICD-10-CM | POA: Diagnosis not present

## 2015-12-13 DIAGNOSIS — I350 Nonrheumatic aortic (valve) stenosis: Secondary | ICD-10-CM | POA: Insufficient documentation

## 2015-12-13 DIAGNOSIS — I501 Left ventricular failure: Secondary | ICD-10-CM | POA: Insufficient documentation

## 2015-12-13 DIAGNOSIS — I480 Paroxysmal atrial fibrillation: Secondary | ICD-10-CM | POA: Insufficient documentation

## 2015-12-13 DIAGNOSIS — I358 Other nonrheumatic aortic valve disorders: Secondary | ICD-10-CM | POA: Diagnosis not present

## 2015-12-14 ENCOUNTER — Other Ambulatory Visit: Payer: Self-pay

## 2015-12-14 ENCOUNTER — Other Ambulatory Visit: Payer: Self-pay | Admitting: Cardiology

## 2015-12-14 DIAGNOSIS — Z23 Encounter for immunization: Secondary | ICD-10-CM | POA: Diagnosis not present

## 2015-12-14 DIAGNOSIS — E782 Mixed hyperlipidemia: Secondary | ICD-10-CM | POA: Diagnosis not present

## 2015-12-14 DIAGNOSIS — I10 Essential (primary) hypertension: Secondary | ICD-10-CM | POA: Diagnosis not present

## 2015-12-14 MED ORDER — METOPROLOL SUCCINATE ER 50 MG PO TB24: 50.0000 mg | ORAL_TABLET | Freq: Two times a day (BID) | ORAL | 9 refills | Status: DC

## 2015-12-15 DIAGNOSIS — Z0000 Encounter for general adult medical examination without abnormal findings: Secondary | ICD-10-CM | POA: Diagnosis not present

## 2015-12-28 DIAGNOSIS — L821 Other seborrheic keratosis: Secondary | ICD-10-CM | POA: Diagnosis not present

## 2015-12-28 DIAGNOSIS — D692 Other nonthrombocytopenic purpura: Secondary | ICD-10-CM | POA: Diagnosis not present

## 2015-12-28 DIAGNOSIS — D044 Carcinoma in situ of skin of scalp and neck: Secondary | ICD-10-CM | POA: Diagnosis not present

## 2015-12-28 DIAGNOSIS — D2361 Other benign neoplasm of skin of right upper limb, including shoulder: Secondary | ICD-10-CM | POA: Diagnosis not present

## 2015-12-28 DIAGNOSIS — Z85828 Personal history of other malignant neoplasm of skin: Secondary | ICD-10-CM | POA: Diagnosis not present

## 2015-12-28 DIAGNOSIS — L570 Actinic keratosis: Secondary | ICD-10-CM | POA: Diagnosis not present

## 2015-12-28 DIAGNOSIS — D2261 Melanocytic nevi of right upper limb, including shoulder: Secondary | ICD-10-CM | POA: Diagnosis not present

## 2015-12-28 DIAGNOSIS — D0359 Melanoma in situ of other part of trunk: Secondary | ICD-10-CM | POA: Diagnosis not present

## 2016-01-11 ENCOUNTER — Ambulatory Visit (INDEPENDENT_AMBULATORY_CARE_PROVIDER_SITE_OTHER): Payer: Medicare Other | Admitting: *Deleted

## 2016-01-11 DIAGNOSIS — Z5181 Encounter for therapeutic drug level monitoring: Secondary | ICD-10-CM | POA: Diagnosis not present

## 2016-01-11 DIAGNOSIS — I480 Paroxysmal atrial fibrillation: Secondary | ICD-10-CM | POA: Diagnosis not present

## 2016-01-11 LAB — POCT INR: INR: 1.8

## 2016-01-17 DIAGNOSIS — Z85828 Personal history of other malignant neoplasm of skin: Secondary | ICD-10-CM | POA: Diagnosis not present

## 2016-01-17 DIAGNOSIS — D225 Melanocytic nevi of trunk: Secondary | ICD-10-CM | POA: Diagnosis not present

## 2016-01-17 DIAGNOSIS — D0359 Melanoma in situ of other part of trunk: Secondary | ICD-10-CM | POA: Diagnosis not present

## 2016-01-19 ENCOUNTER — Other Ambulatory Visit: Payer: Self-pay | Admitting: Cardiology

## 2016-01-19 DIAGNOSIS — I5031 Acute diastolic (congestive) heart failure: Secondary | ICD-10-CM

## 2016-01-19 DIAGNOSIS — I480 Paroxysmal atrial fibrillation: Secondary | ICD-10-CM

## 2016-01-25 ENCOUNTER — Encounter (INDEPENDENT_AMBULATORY_CARE_PROVIDER_SITE_OTHER): Payer: Self-pay

## 2016-01-25 ENCOUNTER — Ambulatory Visit (INDEPENDENT_AMBULATORY_CARE_PROVIDER_SITE_OTHER): Payer: Medicare Other | Admitting: *Deleted

## 2016-01-25 DIAGNOSIS — I480 Paroxysmal atrial fibrillation: Secondary | ICD-10-CM

## 2016-01-25 DIAGNOSIS — Z5181 Encounter for therapeutic drug level monitoring: Secondary | ICD-10-CM | POA: Diagnosis not present

## 2016-01-25 LAB — POCT INR: INR: 2.1

## 2016-01-31 DIAGNOSIS — L570 Actinic keratosis: Secondary | ICD-10-CM | POA: Diagnosis not present

## 2016-01-31 DIAGNOSIS — D044 Carcinoma in situ of skin of scalp and neck: Secondary | ICD-10-CM | POA: Diagnosis not present

## 2016-02-08 ENCOUNTER — Other Ambulatory Visit: Payer: Self-pay | Admitting: Cardiology

## 2016-02-09 ENCOUNTER — Other Ambulatory Visit: Payer: Self-pay | Admitting: Gastroenterology

## 2016-02-09 DIAGNOSIS — R1084 Generalized abdominal pain: Secondary | ICD-10-CM

## 2016-02-15 ENCOUNTER — Other Ambulatory Visit: Payer: Self-pay | Admitting: Cardiology

## 2016-02-15 ENCOUNTER — Ambulatory Visit (INDEPENDENT_AMBULATORY_CARE_PROVIDER_SITE_OTHER): Payer: Medicare Other | Admitting: *Deleted

## 2016-02-15 DIAGNOSIS — I480 Paroxysmal atrial fibrillation: Secondary | ICD-10-CM

## 2016-02-15 DIAGNOSIS — Z5181 Encounter for therapeutic drug level monitoring: Secondary | ICD-10-CM

## 2016-02-15 LAB — POCT INR: INR: 2.2

## 2016-02-17 ENCOUNTER — Ambulatory Visit
Admission: RE | Admit: 2016-02-17 | Discharge: 2016-02-17 | Disposition: A | Discharge status: Home / Self Care | Payer: Medicare Other | Source: Ambulatory Visit | Attending: Gastroenterology | Admitting: Gastroenterology

## 2016-02-17 DIAGNOSIS — R1084 Generalized abdominal pain: Secondary | ICD-10-CM

## 2016-02-17 DIAGNOSIS — K8020 Calculus of gallbladder without cholecystitis without obstruction: Secondary | ICD-10-CM | POA: Diagnosis not present

## 2016-03-15 ENCOUNTER — Ambulatory Visit (INDEPENDENT_AMBULATORY_CARE_PROVIDER_SITE_OTHER): Payer: Medicare Other | Admitting: *Deleted

## 2016-03-15 DIAGNOSIS — Z5181 Encounter for therapeutic drug level monitoring: Secondary | ICD-10-CM

## 2016-03-15 DIAGNOSIS — I480 Paroxysmal atrial fibrillation: Secondary | ICD-10-CM

## 2016-03-15 LAB — POCT INR: INR: 2.2

## 2016-04-04 DIAGNOSIS — Z85828 Personal history of other malignant neoplasm of skin: Secondary | ICD-10-CM | POA: Diagnosis not present

## 2016-04-04 DIAGNOSIS — D225 Melanocytic nevi of trunk: Secondary | ICD-10-CM | POA: Diagnosis not present

## 2016-04-04 DIAGNOSIS — Z85820 Personal history of malignant melanoma of skin: Secondary | ICD-10-CM | POA: Diagnosis not present

## 2016-04-04 DIAGNOSIS — D2372 Other benign neoplasm of skin of left lower limb, including hip: Secondary | ICD-10-CM | POA: Diagnosis not present

## 2016-04-04 DIAGNOSIS — D0472 Carcinoma in situ of skin of left lower limb, including hip: Secondary | ICD-10-CM | POA: Diagnosis not present

## 2016-04-04 DIAGNOSIS — D2361 Other benign neoplasm of skin of right upper limb, including shoulder: Secondary | ICD-10-CM | POA: Diagnosis not present

## 2016-04-04 DIAGNOSIS — L570 Actinic keratosis: Secondary | ICD-10-CM | POA: Diagnosis not present

## 2016-04-04 DIAGNOSIS — D485 Neoplasm of uncertain behavior of skin: Secondary | ICD-10-CM | POA: Diagnosis not present

## 2016-04-18 ENCOUNTER — Encounter (INDEPENDENT_AMBULATORY_CARE_PROVIDER_SITE_OTHER): Payer: Self-pay

## 2016-04-18 ENCOUNTER — Ambulatory Visit (INDEPENDENT_AMBULATORY_CARE_PROVIDER_SITE_OTHER): Payer: Medicare Other | Admitting: *Deleted

## 2016-04-18 DIAGNOSIS — I480 Paroxysmal atrial fibrillation: Secondary | ICD-10-CM | POA: Diagnosis not present

## 2016-04-18 DIAGNOSIS — Z5181 Encounter for therapeutic drug level monitoring: Secondary | ICD-10-CM

## 2016-04-18 LAB — POCT INR: INR: 1.9

## 2016-04-24 ENCOUNTER — Encounter: Payer: Self-pay | Admitting: Family Medicine

## 2016-04-24 ENCOUNTER — Ambulatory Visit (INDEPENDENT_AMBULATORY_CARE_PROVIDER_SITE_OTHER): Payer: Medicare Other | Admitting: Family Medicine

## 2016-04-24 VITALS — BP 124/76 | HR 61 | Temp 97.5°F | Ht 73.0 in | Wt 252.8 lb

## 2016-04-24 DIAGNOSIS — I10 Essential (primary) hypertension: Secondary | ICD-10-CM

## 2016-04-24 DIAGNOSIS — R972 Elevated prostate specific antigen [PSA]: Secondary | ICD-10-CM

## 2016-04-24 DIAGNOSIS — K7581 Nonalcoholic steatohepatitis (NASH): Secondary | ICD-10-CM | POA: Diagnosis not present

## 2016-04-24 DIAGNOSIS — R600 Localized edema: Secondary | ICD-10-CM | POA: Diagnosis not present

## 2016-04-24 DIAGNOSIS — E119 Type 2 diabetes mellitus without complications: Secondary | ICD-10-CM | POA: Diagnosis not present

## 2016-04-24 DIAGNOSIS — D5912 Cold autoimmune hemolytic anemia: Secondary | ICD-10-CM

## 2016-04-24 DIAGNOSIS — I480 Paroxysmal atrial fibrillation: Secondary | ICD-10-CM | POA: Diagnosis not present

## 2016-04-24 DIAGNOSIS — E669 Obesity, unspecified: Secondary | ICD-10-CM

## 2016-04-24 DIAGNOSIS — K8020 Calculus of gallbladder without cholecystitis without obstruction: Secondary | ICD-10-CM | POA: Diagnosis not present

## 2016-04-24 DIAGNOSIS — E785 Hyperlipidemia, unspecified: Secondary | ICD-10-CM | POA: Diagnosis not present

## 2016-04-24 DIAGNOSIS — I5031 Acute diastolic (congestive) heart failure: Secondary | ICD-10-CM | POA: Diagnosis not present

## 2016-04-24 DIAGNOSIS — J449 Chronic obstructive pulmonary disease, unspecified: Secondary | ICD-10-CM

## 2016-04-24 DIAGNOSIS — D591 Other autoimmune hemolytic anemias: Secondary | ICD-10-CM

## 2016-04-24 DIAGNOSIS — Z1159 Encounter for screening for other viral diseases: Secondary | ICD-10-CM

## 2016-04-24 NOTE — Progress Notes (Signed)
Pre visit review using our clinic review tool, if applicable. No additional management support is needed unless otherwise documented below in the visit note. 

## 2016-04-26 ENCOUNTER — Encounter: Payer: Self-pay | Admitting: Family Medicine

## 2016-04-26 DIAGNOSIS — E669 Obesity, unspecified: Secondary | ICD-10-CM | POA: Insufficient documentation

## 2016-04-26 DIAGNOSIS — K8020 Calculus of gallbladder without cholecystitis without obstruction: Secondary | ICD-10-CM | POA: Insufficient documentation

## 2016-04-26 DIAGNOSIS — K7581 Nonalcoholic steatohepatitis (NASH): Secondary | ICD-10-CM | POA: Insufficient documentation

## 2016-04-26 NOTE — Assessment & Plan Note (Signed)
Lab Results  Component Value Date   HGBA1C 6.0 11/25/2015

## 2016-04-26 NOTE — Assessment & Plan Note (Signed)
ECHO (12/13/15): Moderate LVH. Systolic function was normal. Estimated EF = 55% to 60%. Wall motion was normal; there were no regional wall motion abnormalities. Features are consistent with grade 2 diastolic dysfunction. Aortic valve: There was very mild stenosis. Right atrium: The atrium was mildly dilated.

## 2016-04-26 NOTE — Progress Notes (Signed)
Steven Shepard is a 71 y.o. male is here to [facility].   History of Present Illness:   SEE A/P FOR UPDATES.  1. Essential hypertension   2. Type 2 diabetes mellitus without complication, without long-term current use of insulin ([place])   3. 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 Elevated prostate specific antigen (PSA)   5. Hyperlipidemia, unspecified hyperlipidemia type   6. Acute diastolic heart failure ([place])   7. Chronic obstructive pulmonary disease, unspecified COPD type ([place])   8. PAF (paroxysmal atrial fibrillation) (HCC)   9. Calculus of gallbladder without cholecystitis without obstruction   10. NASH (nonalcoholic steatohepatitis)   11. Lower extremity edema   12. Obesity (BMI 30.0-34.9)    Health Maintenance reviewed - will update chart.   PMHx, SurgHx, SocialHx, Medications, and Allergies were reviewed in the Visit Navigator and updated as appropriate.    Past Medical History:  Diagnosis Date  . Anemia, hemolytic ([place]) 01/22/2011  . Atrial fibrillation ([place])   . 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 COPD (chronic obstructive pulmonary disease) ([place])   . DOE (dyspnea on exertion)   . History of neck surgery   . Hyperlipidemia   . Hypertension   . OSA on CPAP   . Umbilical hernia     Past Surgical History:  Procedure Laterality Date  . ADENOIDECTOMY    . HIATAL HERNIA REPAIR  2011  . NECK SURGERY  2005  . SKIN SURGERY     skin cancer  . TONSILLECTOMY    . TOTAL HIP ARTHROPLASTY Right 09/18/2010   Dr. Maureen Ralphs    Family History  Problem Relation Age of Onset  . Coronary artery disease Mother   . Hypertension Mother   . Heart attack Mother   . Coronary artery disease Father   . Hypertension Father   . Heart attack Father   . Healthy Sister   . Liver disease Brother   . Diabetes Brother   . Cancer Brother   . Diabetes Sister   . Hypertension Sister   . Colon cancer Neg Hx   . Colonic polyp Neg Hx     Social History  Substance Use  Topics  . Smoking status: Former Smoker    Packs/day: 1.50    Years: 3.00    Types: Cigarettes, Pipe    Quit date: 03/12/1973  . Smokeless tobacco: Never Used     Comment: quit appox 36 years ago  . Alcohol use Yes     Comment: occasionally, 1-7 drinks per week     Current Medications and Allergies:    Current Outpatient Prescriptions:  .  acetaminophen (TYLENOL) 650 MG CR tablet, Take 650 mg by mouth 2 (two) times daily as needed for pain. , Disp: , Rfl:  .  aspirin 81 MG tablet, Take 81 mg by mouth daily.  , Disp: , Rfl:  .  atorvastatin (LIPITOR) 20 MG tablet, Take 20 mg by mouth daily.  , Disp: , Rfl:  .  cloNIDine (CATAPRES) 0.3 MG tablet, TAKE 1 TABLET BY MOUTH TWICE DAILY, Disp: 180 tablet, Rfl: 3 .  diltiazem (CARDIZEM CD) 180 MG 24 hr capsule, TAKE 2 CAPSULES BY MOUTH EVERY DAY, Disp: 180 capsule, Rfl: 1 .  Fexofenadine HCl (ALLEGRA PO), Take 1 tablet by mouth every morning. , Disp: , Rfl:  .  folic acid (FOLVITE) 867 MCG tablet, Take 400 mcg by mouth 2 (two) times daily., Disp: , Rfl:  .  furosemide (LASIX) 40 MG tablet,  TAKE 1 TABLET BY MOUTH TWICE DAILY, TAKE AT 8AM AND AT 2PM, Disp: 180 tablet, Rfl: 2 .  metoprolol succinate (TOPROL-XL) 50 MG 24 hr tablet, Take 1 tablet (50 mg total) by mouth 2 (two) times daily., Disp: 60 tablet, Rfl: 9 .  Multiple Vitamin (MULTIVITAMIN) capsule, Take 1 capsule by mouth daily.  , Disp: , Rfl:  .  Omega-3 Fatty Acids (FISH OIL) 1000 MG CAPS, Take by mouth., Disp: , Rfl:  .  potassium chloride SA (K-DUR,KLOR-CON) 20 MEQ tablet, Take 1 tablet (20 mEq total) by mouth 2 (two) times daily., Disp: 180 tablet, Rfl: 3 .  ranitidine (ZANTAC) 75 MG tablet, Take 75 mg by mouth as needed for heartburn. , Disp: , Rfl:  .  Tamsulosin HCl (FLOMAX) 0.4 MG CAPS, Take 0.4 mg by mouth daily.  , Disp: , Rfl:  .  warfarin (COUMADIN) 5 MG tablet, TAKE AS DIRECTED BY COUMADIN CLINIC, Disp: 30 tablet, Rfl: 2 .  Probiotic Product (PROBIOTIC DAILY PO), Take 1  capsule by mouth daily. , Disp: , Rfl:     Allergies  Allergen Reactions  . Ace Inhibitors Other (See Comments)    Other reaction(s): Other (See Comments) High K+  . Codeine     Other reaction(s): Other (See Comments) Hallucinations  . Spironolactone     Increases potassium      Patient Information Form: Screening and ROS    Do you feel safe in relationships? yes PHQ-2: negative  Review of Systems  General:  Negative for nexplained weight loss, fever Skin: Negative for new or changing mole, sore that won't heal HEENT: Negative for trouble hearing, trouble seeing, ringing in ears, mouth sores, hoarseness, change in voice, dysphagia CV:  Negative for chest pain, dyspnea, edema, palpitations Resp: Negative for cough, dyspnea, hemoptysis GI: Negative for nausea, vomiting, diarrhea, constipation, abdominal pain, melena, hematochezia GU: Negative for dysuria, incontinence, urinary hesitance, hematuria, vaginal or penile discharge, polyuria, sexual difficulty, lumps in testicle or breasts MSK: Negative for muscle cramps or aches, joint pain or swelling Neuro: Negative for headaches, weakness, numbness, dizziness, passing out/fainting Psych: Negative for depression, anxiety, memory problems   Vitals:   Vitals:   04/24/16 1102  BP: 124/76  Pulse: 61  Temp: 97.5 F (36.4 C)  TempSrc: Oral  SpO2: 97%  Weight: 252 lb 12.8 oz (114.7 kg)  Height: 6' 1"  (1.854 m)     Body mass index is 33.35 kg/m.   Physical Exam:     General: Alert, cooperative, appears stated age and no distress.  HEENT:  Normocephalic, without obvious abnormality, atraumatic. Conjunctivae/corneas clear. PERRL, EOM's intact. Normal TM's and external ear canals both ears. Nares normal. Septum midline. Mucosa normal. No drainage or sinus tenderness. Lips, mucosa, and tongue normal; teeth and gums normal.  Lungs: Clear to auscultation bilaterally.  Heart:: Regular rate and rhythm, S1, S2 normal, no  murmur, click, rub or gallop.  Abdomen: Soft, non-tender; bowel sounds normal; no masses,  no organomegaly.  Extremities: Extremities normal, atraumatic, no cyanosis or edema.  Pulses: 2+ and symmetric.  Skin: Skin color, texture, turgor normal. No rashes or lesions.  Neurologic: Alert and oriented X 3, normal strength and tone. Normal symmetric. reflexes. Normal coordination and gait.  Psych: Alert,oriented, in NAD with a full range of affect, normal behavior and no psychotic features      Assessment and Plan:   THE PATIENT'S HX WAS THOROUGHLY REVIEWED. LIST UPDATED. LABS ORDERED.   Problem  Gallstone   12/14/14 CT  abdomen: 9 mm gallstone without acute cholecystitis or biliary duct dilatation.   Karlene Lineman (Nonalcoholic Steatohepatitis)  Obesity (BMI 30.0-34.9)  Osteoarthrosis Involving Lower Leg  Acute Diastolic Heart Failure ([facility])   ECHO (12/13/15): Moderate LVH. Systolic function was normal. Estimated EF = 55% to 60%. Wall motion was normal; there were no regional wall motion abnormalities. Features are consistent with grade 2 diastolic dysfunction. Aortic valve: There was very mild stenosis. Right atrium: The atrium was mildly dilated.   Benign Non-Nodular Prostatic Hyperplasia  Diabetes Mellitus (Hcc)  Lower Extremity Edema   "Lower extremity edema - no other evidence of heart failure, BNP checked at the last visit 20. He's advised to cut down on salt, elevate his legs as much as possible. Continue taking Lasix and use compression stockings. This doesn't seem to be related to his heart. Advised about low sodium diet." - Ena Dawley, MD, [facility], 11/30/2015, 3:47 AM.   Systolic Murmur of Aorta  Elevated Prostate Specific Antigen (PSA)  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HCC)  Anemia, Hemolytic (HCC)  PAF (Paroxysmal Atrial Fibrillation) (Hcc)   On chronic Coumadin, with goal 2-3. Followed by Cardiology.    Atherosclerosis of Native Arteries of Extremity With Rest Pain (Hcc)  Spinal Stenosis  of Lumbar Region With Neurogenic Claudication  Thoracic Or Lumbosacral Neuritis Or Radiculitis, Unspecified  Chronic Obstructive Pulmonary Disease (Hcc)   Golds Stage I COPD   Benign Essential Hypertension  Obstructive Sleep Apnea Syndrome   Severe obstructive sleep apnea with AHI 86/h, corrected by CPAP 9 cm. Compliant.    Hyperlipidemia     Gastroesophageal Reflux Disease With Esophagitis    . Reviewed expectations re: course of current medical issues. . Discussed self-management of symptoms. . Outlined signs and symptoms indicating need for more acute intervention. . Patient verbalized understanding and all questions were answered. . See orders for this visit as documented in the electronic medical record. . Patient received an After Visit Summary.  Records requested if needed. I spent 45 minutes with this patient, greater than 50% was face-to-face time counseling regarding the above diagnoses.    Briscoe Deutscher, [place], [place] 04/26/2016   Follow-up: 3 months.  There are no discontinued medications. Orders Placed This Encounter  Procedures  . Comprehensive metabolic panel  . Lipid panel  . PSA

## 2016-04-26 NOTE — Assessment & Plan Note (Addendum)
Followed by GI. Recent CT abdomen for concern for perihepatic mass was clear.

## 2016-04-26 NOTE — Assessment & Plan Note (Signed)
12/14/14 CT abdomen: 9 mm gallstone without acute cholecystitis or biliary duct dilatation.

## 2016-04-26 NOTE — Assessment & Plan Note (Signed)
Home blood pressure readings at goal. Avoiding excessive salt intake. Trying to exercise on a regular basis. Denies chest pain. Taking medications as prescribed without side effects.   Wt Readings from Last 3 Encounters:  04/24/16 252 lb 12.8 oz (114.7 kg)  11/30/15 255 lb (115.7 kg)  06/08/15 254 lb 9.6 oz (115.5 kg)   Reports that he quit smoking about 43 years ago. His smoking use included Cigarettes and Pipe. He has a 4.50 pack-year smoking history. He has never used smokeless tobacco. BP Readings from Last 3 Encounters:  04/24/16 124/76  11/30/15 130/80  06/08/15 128/80   Lab Results  Component Value Date   CREATININE 0.77 02/16/2015

## 2016-04-26 NOTE — Assessment & Plan Note (Signed)
On chronic Coumadin, with goal 2-3. Followed by Cardiology.

## 2016-04-29 NOTE — Addendum Note (Signed)
Addended by: Briscoe Deutscher R on: 04/29/2016 03:38 PM   Modules accepted: Orders

## 2016-05-02 ENCOUNTER — Ambulatory Visit (INDEPENDENT_AMBULATORY_CARE_PROVIDER_SITE_OTHER): Payer: Medicare Other | Admitting: *Deleted

## 2016-05-02 DIAGNOSIS — I4891 Unspecified atrial fibrillation: Secondary | ICD-10-CM

## 2016-05-02 LAB — POCT INR: INR: 2.5

## 2016-05-12 ENCOUNTER — Other Ambulatory Visit: Payer: Self-pay | Admitting: Cardiology

## 2016-05-16 ENCOUNTER — Other Ambulatory Visit (INDEPENDENT_AMBULATORY_CARE_PROVIDER_SITE_OTHER): Payer: Medicare Other

## 2016-05-16 DIAGNOSIS — E119 Type 2 diabetes mellitus without complications: Secondary | ICD-10-CM | POA: Diagnosis not present

## 2016-05-16 DIAGNOSIS — I10 Essential (primary) hypertension: Secondary | ICD-10-CM | POA: Diagnosis not present

## 2016-05-16 DIAGNOSIS — R972 Elevated prostate specific antigen [PSA]: Secondary | ICD-10-CM | POA: Diagnosis not present

## 2016-05-16 DIAGNOSIS — E785 Hyperlipidemia, unspecified: Secondary | ICD-10-CM | POA: Diagnosis not present

## 2016-05-16 LAB — LIPID PANEL
Cholesterol: 160 mg/dL (ref 0–200)
HDL: 33.4 mg/dL — ABNORMAL LOW (ref >39.00)
LDL Cholesterol: 88 mg/dL (ref 0–99)
NonHDL: 127.05
Total CHOL/HDL Ratio: 5
Triglycerides: 196 mg/dL — ABNORMAL HIGH (ref 0.0–149.0)
VLDL: 39.2 mg/dL (ref 0.0–40.0)

## 2016-05-16 LAB — PSA: PSA: 0.74 ng/mL (ref 0.10–4.00)

## 2016-05-16 LAB — COMPREHENSIVE METABOLIC PANEL
ALT: 28 U/L (ref 0–53)
AST: 17 U/L (ref 0–37)
Albumin: 4 g/dL (ref 3.5–5.2)
Alkaline Phosphatase: 79 U/L (ref 39–117)
BUN: 18 mg/dL (ref 6–23)
CO2: 31 mEq/L (ref 19–32)
Calcium: 9.1 mg/dL (ref 8.4–10.5)
Chloride: 105 mEq/L (ref 96–112)
Creatinine, Ser: 0.95 mg/dL (ref 0.40–1.50)
GFR: 83.48 mL/min (ref >60.00)
Glucose, Bld: 129 mg/dL — ABNORMAL HIGH (ref 70–99)
Potassium: 4.1 mEq/L (ref 3.5–5.1)
Sodium: 141 mEq/L (ref 135–145)
Total Bilirubin: 1.6 mg/dL — ABNORMAL HIGH (ref 0.2–1.2)
Total Protein: 6.3 g/dL (ref 6.0–8.3)

## 2016-05-16 LAB — MICROALBUMIN / CREATININE URINE RATIO
Creatinine,U: 296.1 mg/dL
Microalb Creat Ratio: 0.6 mg/g (ref 0.0–30.0)
Microalb, Ur: 1.7 mg/dL (ref 0.0–1.9)

## 2016-05-17 IMAGING — US US ABDOMEN COMPLETE
1 series · 13 of 25 positions shown · non-contrast
Comparison: Abdominal ultrasound 12/01/2013.

CLINICAL DATA: Nonalcoholic serohepatitis. Abnormal findings on
diagnostic imaging of gallbladder.

EXAM:
ULTRASOUND ABDOMEN COMPLETE

[Series 1: us abdomen complete · 0.22mm/px · 13 of 98 slices shown]
[im 1/98]
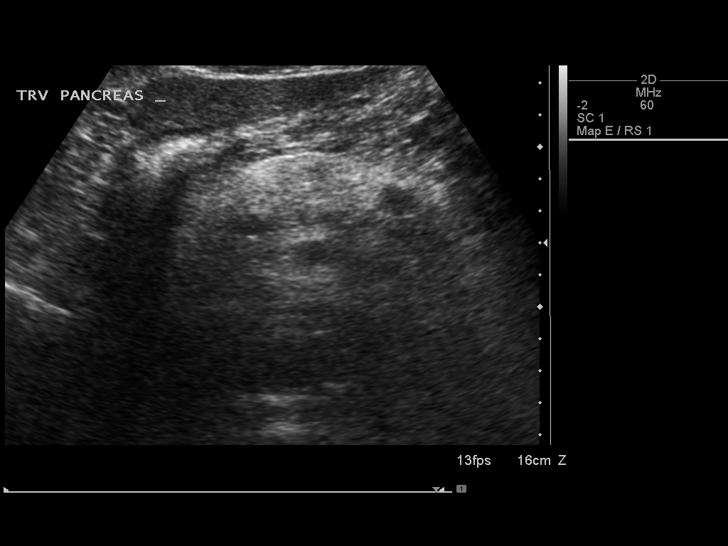
[im 9/98]
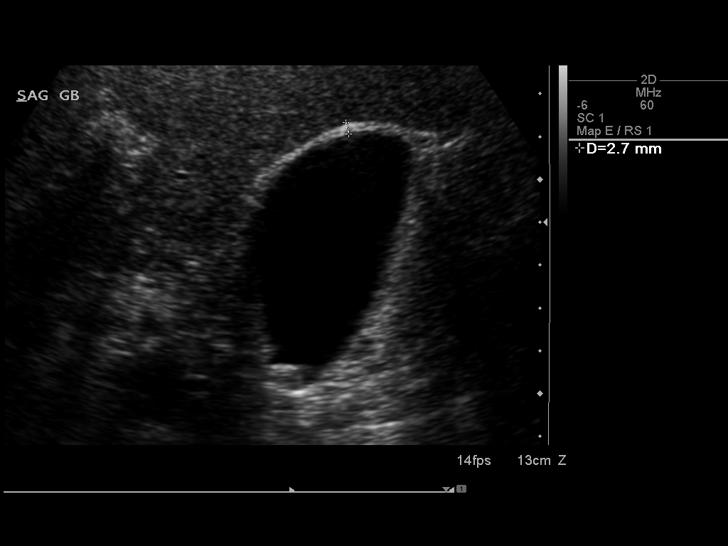
[im 17/98]
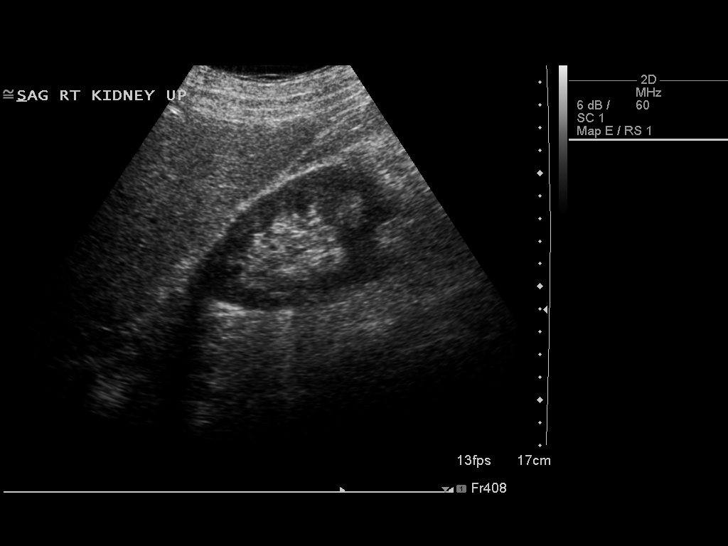
[im 25/98]
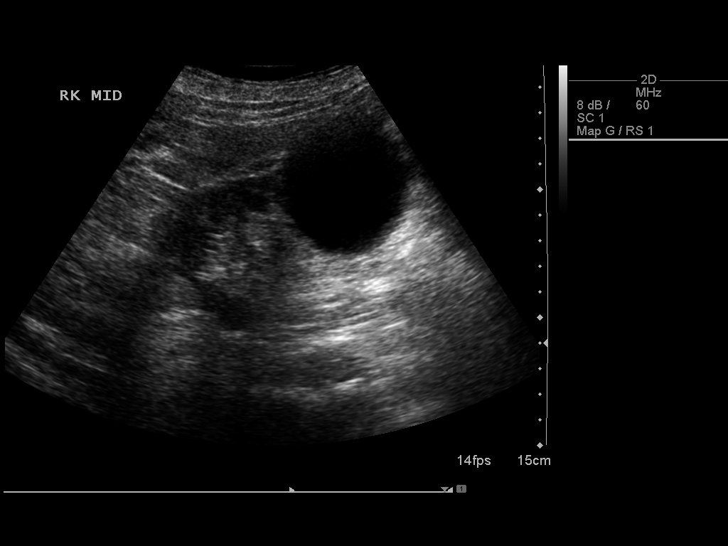
[im 33/98]
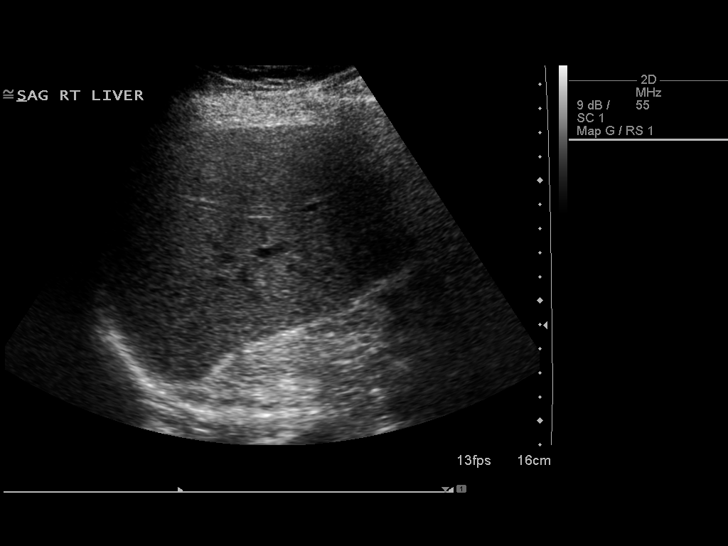
[im 41/98]
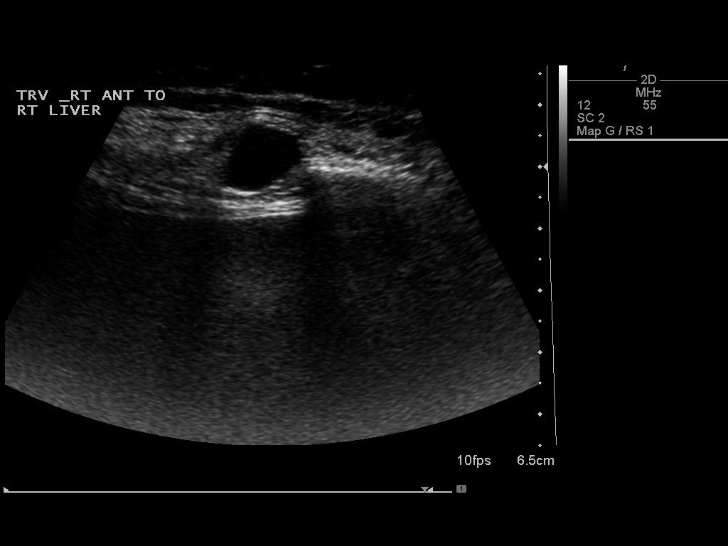
[im 49/98]
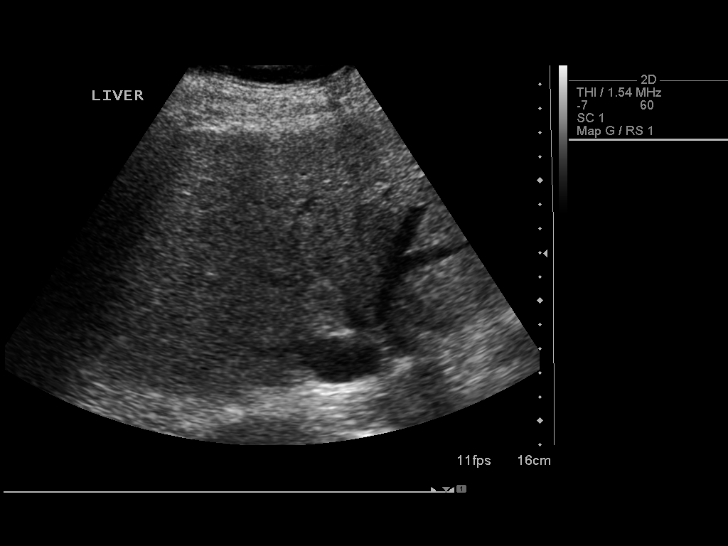
[im 57/98]
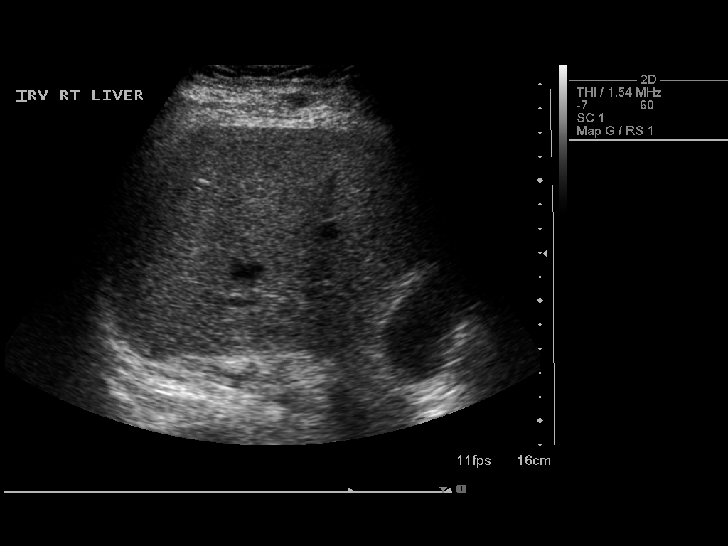
[im 65/98]
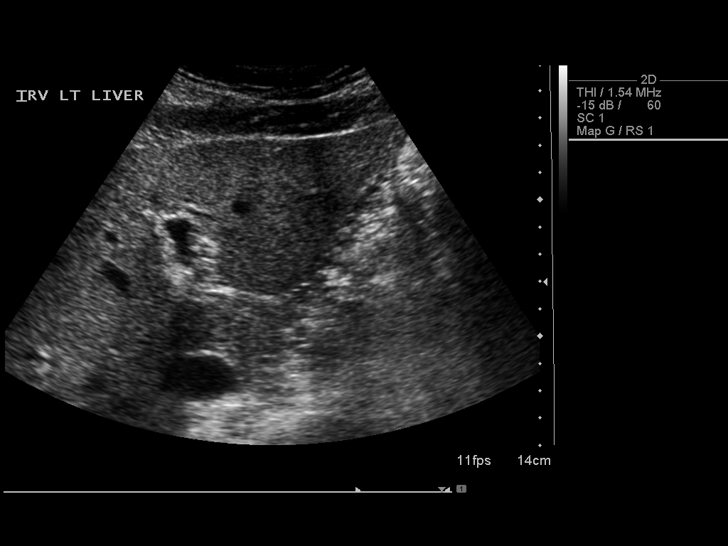
[im 73/98]
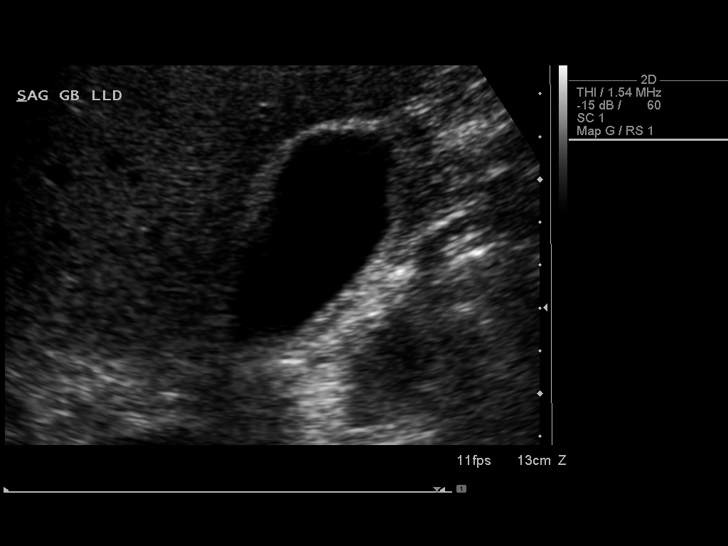
[im 81/98]
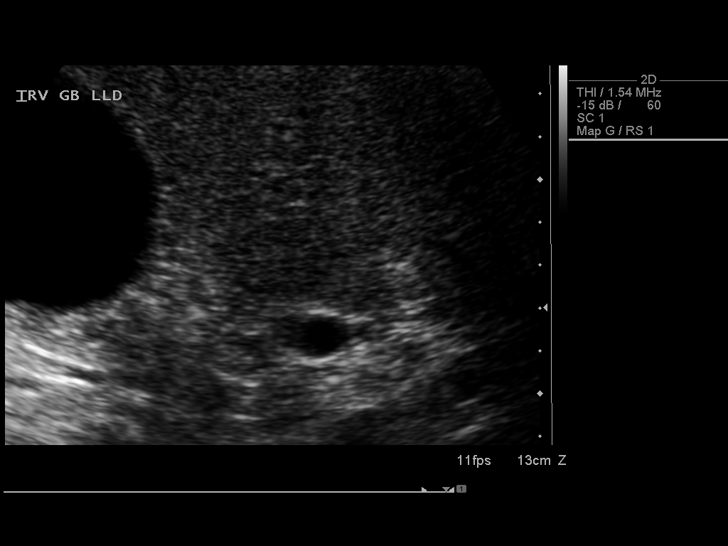
[im 89/98]
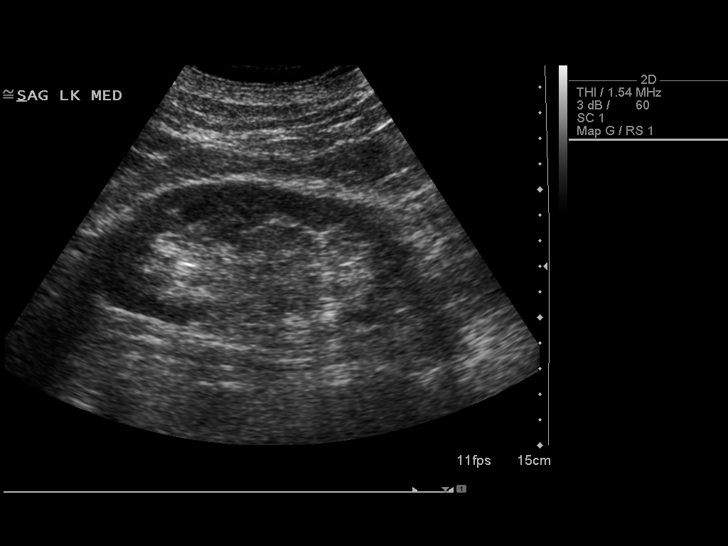
[im 98/98]
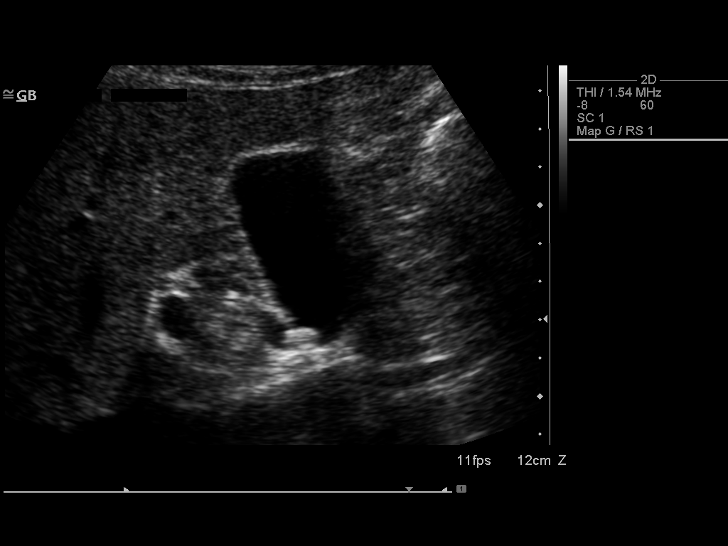

[13 of 25 positions shown; findings below may reference images not displayed]

FINDINGS: Gallbladder: And 8 mm remote bowel shadowing stone is present in the
gallbladder. Gallbladder wall thickness is within normal limits.
There is no sonographic Murphy sign.

Common bile duct: Diameter: 3.5 mm, within normal limits.

Liver: The liver is diffusely echogenic. No focal lesions are
evident.

IVC: No abnormality visualized.

Pancreas: Visualized portion unremarkable.

Spleen: Size and appearance within normal limits.

Right Kidney: Length: 13.6 cm, within normal limits. The parenchyma
is unremarkable. A simple cyst measures 6.8 x 6.3 x 6.2 cm.

Left Kidney: Length: 3.4 cm, within normal limits. Echogenicity
within normal limits. No mass or hydronephrosis visualized.

Abdominal aorta: No aneurysm visualized.

Other findings: A simple cyst is noted anterior to the right lobe of
the liver measuring 1.6 x 1.1 x 1.2 cm. This was not seen
previously.
IMPRESSION: 1. Stable increased echogenicity of the liver suggesting diffuse
fatty infiltration.
2. Simple cyst of the right kidney.
3. New benign cystic lesion anterior to the right lobe of the liver.
Recommend CT of the abdomen with contrast for further evaluation of
this knee lesion.

## 2016-05-22 ENCOUNTER — Other Ambulatory Visit: Payer: Self-pay

## 2016-05-22 DIAGNOSIS — R739 Hyperglycemia, unspecified: Secondary | ICD-10-CM

## 2016-05-23 ENCOUNTER — Other Ambulatory Visit (INDEPENDENT_AMBULATORY_CARE_PROVIDER_SITE_OTHER): Payer: Medicare Other

## 2016-05-23 DIAGNOSIS — E1165 Type 2 diabetes mellitus with hyperglycemia: Secondary | ICD-10-CM

## 2016-05-23 DIAGNOSIS — R739 Hyperglycemia, unspecified: Secondary | ICD-10-CM

## 2016-05-23 LAB — HEMOGLOBIN A1C: Hgb A1c MFr Bld: 6.3 % (ref 4.6–6.5)

## 2016-05-25 ENCOUNTER — Ambulatory Visit (INDEPENDENT_AMBULATORY_CARE_PROVIDER_SITE_OTHER): Payer: Medicare Other | Admitting: Family Medicine

## 2016-05-25 VITALS — BP 122/76 | HR 62 | Temp 97.8°F | Wt 255.8 lb

## 2016-05-25 DIAGNOSIS — E119 Type 2 diabetes mellitus without complications: Secondary | ICD-10-CM | POA: Diagnosis not present

## 2016-05-25 DIAGNOSIS — E782 Mixed hyperlipidemia: Secondary | ICD-10-CM | POA: Diagnosis not present

## 2016-05-25 DIAGNOSIS — E669 Obesity, unspecified: Secondary | ICD-10-CM

## 2016-05-25 NOTE — Progress Notes (Signed)
Steven Shepard is a 71 y.o. male is here to discuss:  History of Present Illness:   Chief Complaint  Patient presents with  . Diabetes    Discuss medication for elevated A1C   HPI:  Lab Results  Component Value Date   CHOL 160 05/16/2016   HDL 33.40 (L) 05/16/2016   LDLCALC 88 05/16/2016   TRIG 196.0 (H) 05/16/2016   CHOLHDL 5 05/16/2016   Lab Results  Component Value Date   HGBA1C 6.3 05/23/2016   Patient presents for discussion re: elevated A1c. He is on no medications for this issue. See labs above. See AP below.  Health Maintenance Due  Topic Date Due  . Hepatitis C Screening  1945/09/21  . FOOT EXAM  02/08/1957  . OPHTHALMOLOGY EXAM  02/08/1957  . TETANUS/TDAP  11/21/2015    PMHx, SurgHx, SocialHx, FamHx, Medications, and Allergies were reviewed in the Visit Navigator and updated as appropriate.   Patient Active Problem List   Diagnosis Date Noted  . Gallstone 04/26/2016  . NASH (nonalcoholic steatohepatitis) 04/26/2016  . Obesity (BMI 30.0-34.9) 04/26/2016  . Osteoarthrosis involving lower leg 05/21/2015  . Acute diastolic heart failure ([place]) 02/16/2015  . Benign non-nodular prostatic hyperplasia 01/11/2014  . Diabetes mellitus ([place]) 11/25/2013  . Lower extremity edema 08/24/2013  . Cryoglobulinemia ([place]) 01/09/2013  . Systolic murmur of aorta 66/29/4765  . Elevated prostate specific antigen (PSA) 01/10/2012  . 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 Anemia, hemolytic ([place]) 01/22/2011  . Edema 12/26/2010  . Long term (current) use of anticoagulants 06/20/2010  . PAF (paroxysmal atrial fibrillation) ([place]) 06/09/2010  . Atherosclerosis of native arteries of extremity with rest pain ([place]) 02/14/2010  . Spinal stenosis of lumbar region with neurogenic claudication 02/14/2010  . Thoracic or lumbosacral neuritis or radiculitis, unspecified 01/10/2010  . Chronic obstructive pulmonary disease ([place]) 08/10/2009  . Benign essential hypertension  12/15/2008  . Obstructive sleep apnea syndrome 03/02/2008  . Hyperlipidemia 03/01/2008  . Gastroesophageal reflux disease with esophagitis 11/27/2007    Social History  Substance Use Topics  . Smoking status: Former Smoker    Packs/day: 1.50    Years: 3.00    Types: Cigarettes, Pipe    Quit date: 03/12/1973  . Smokeless tobacco: Never Used     Comment: quit appox 36 years ago  . Alcohol use Yes     Comment: occasionally, 1-7 drinks per week    Current Medications and Allergies:    Current Outpatient Prescriptions:  .  acetaminophen (TYLENOL) 650 MG CR tablet, Take 650 mg by mouth 2 (two) times daily as needed for pain. , Disp: , Rfl:  .  aspirin 81 MG tablet, Take 81 mg by mouth daily.  , Disp: , Rfl:  .  atorvastatin (LIPITOR) 20 MG tablet, Take 20 mg by mouth daily.  , Disp: , Rfl:  .  CARTIA XT 180 MG 24 hr capsule, TAKE 2 CAPSULES BY MOUTH EVERY DAY, Disp: 180 capsule, Rfl: 1 .  cloNIDine (CATAPRES) 0.3 MG tablet, TAKE 1 TABLET BY MOUTH TWICE DAILY, Disp: 180 tablet, Rfl: 3 .  Fexofenadine HCl (ALLEGRA PO), Take 1 tablet by mouth every morning. , Disp: , Rfl:  .  folic acid (FOLVITE) 465 MCG tablet, Take 400 mcg by mouth 2 (two) times daily., Disp: , Rfl:  .  furosemide (LASIX) 40 MG tablet, TAKE 1 TABLET BY MOUTH TWICE DAILY, TAKE AT 8AM AND AT 2PM, Disp: 180 tablet, Rfl: 2 .  metoprolol succinate (TOPROL-XL) 50 MG  24 hr tablet, Take 1 tablet (50 mg total) by mouth 2 (two) times daily., Disp: 60 tablet, Rfl: 9 .  Multiple Vitamin (MULTIVITAMIN) capsule, Take 1 capsule by mouth daily.  , Disp: , Rfl:  .  Omega-3 Fatty Acids (FISH OIL) 1000 MG CAPS, Take by mouth., Disp: , Rfl:  .  potassium chloride SA (K-DUR,KLOR-CON) 20 MEQ tablet, Take 1 tablet (20 mEq total) by mouth 2 (two) times daily., Disp: 180 tablet, Rfl: 3 .  Probiotic Product (PROBIOTIC DAILY PO), Take 1 capsule by mouth daily. , Disp: , Rfl:  .  ranitidine (ZANTAC) 75 MG tablet, Take 75 mg by mouth as needed for  heartburn. , Disp: , Rfl:  .  Tamsulosin HCl (FLOMAX) 0.4 MG CAPS, Take 0.4 mg by mouth daily.  , Disp: , Rfl:  .  warfarin (COUMADIN) 5 MG tablet, TAKE AS DIRECTED BY COUMADIN CLINIC, Disp: 30 tablet, Rfl: 2  Allergies  Allergen Reactions  . Ace Inhibitors Other (See Comments)    Other reaction(s): Other (See Comments) High K+  . Codeine     Other reaction(s): Other (See Comments) Hallucinations  . Spironolactone     Increases potassium    Review of Systems   Review of Systems  Constitutional: Negative for chills, fever and malaise/fatigue.  HENT: Positive for sinus pain. Negative for congestion, ear pain and sore throat.        Chronic sinus issues  Eyes: Negative for blurred vision and double vision.  Respiratory: Negative for cough, shortness of breath and wheezing.   Cardiovascular: Negative for chest pain and leg swelling.  Gastrointestinal: Negative for diarrhea, nausea and vomiting.  Musculoskeletal: Negative for back pain and neck pain.  Skin: Negative for rash.  Neurological: Negative for dizziness, weakness and headaches.  Psychiatric/Behavioral: Negative for depression, hallucinations and memory loss.    Vitals:   Today's Vitals   05/25/16 0950  BP: 122/76  Pulse: 62  Temp: 97.8 F (36.6 C)  TempSrc: Oral  SpO2: 96%  Weight: 255 lb 12.8 oz (116 kg)    Physical Exam:    Physical Exam  Constitutional: He is oriented to person, place, and time. He appears well-developed and well-nourished. No distress.  HENT:  Head: Normocephalic and atraumatic.  Right Ear: External ear normal.  Left Ear: External ear normal.  Nose: Nose normal.  Mouth/Throat: Oropharynx is clear and moist.  Eyes: Conjunctivae and EOM are normal. Pupils are equal, round, and reactive to light.  Neck: Normal range of motion. Neck supple.  Cardiovascular: Normal rate, regular rhythm, normal heart sounds and intact distal pulses.   Pulmonary/Chest: Effort normal and breath sounds  normal.  Neurological: He is alert and oriented to person, place, and time.  Skin: Skin is warm and dry.  Psychiatric: He has a normal mood and affect. His behavior is normal. Judgment and thought content normal.  Nursing note and vitals reviewed.    Assessment and Plan:   Steven Shepard was seen today for diabetes.  Diagnoses and all orders for this visit:  Type 2 diabetes mellitus without complication, without long-term current use of insulin ([place]) Comments: Patient declines medication at this time. He would like to work on diet for the next 3 months. 15 minutes spent with patient reviewing diabetic diet. Recheck in 3 month.  Mixed hyperlipidemia Comments: No change today. Recheck in 3 months.   Obesity (BMI 30.0-34.9) Comments: As above.   . Reviewed expectations re: course of current medical issues. . Discussed self-management of symptoms. Marland Kitchen  Outlined signs and symptoms indicating need for more acute intervention. . Patient verbalized understanding and all questions were answered. . See orders for this visit as documented in the electronic medical record. . Patient received an After Visit Summary.   [name], CMA, acted as Education administrator for Dr. Juleen China.  CMA served as Education administrator during this visit. History, Physical, and Plan performed by medical provider. Documentation and orders reviewed and attested to. Briscoe Deutscher, D.O.  Briscoe Deutscher, [place], [facility] 05/25/2016

## 2016-05-25 NOTE — Progress Notes (Signed)
Pre visit review using our clinic review tool, if applicable. No additional management support is needed unless otherwise documented below in the visit note. 

## 2016-05-25 NOTE — Patient Instructions (Addendum)
Mediterranean Diet A Mediterranean diet refers to food and lifestyle choices that are based on the traditions of countries located on the [place]. This way of eating has been shown to help prevent certain conditions and improve outcomes for people who have chronic diseases, like kidney disease and heart disease. What are tips for following this plan? Lifestyle  Cook and eat meals together with your family, when possible.  Drink enough fluid to keep your urine clear or pale yellow.  Be physically active every day. This includes:  Aerobic exercise like running or swimming.  Leisure activities like gardening, walking, or housework.  Get 7-8 hours of sleep each night.  If recommended by your health care provider, drink red wine in moderation. This means 1 glass a day for nonpregnant women and 2 glasses a day for men. A glass of wine equals 5 oz (150 mL). Reading food labels  Check the serving size of packaged foods. For foods such as rice and pasta, the serving size refers to the amount of cooked product, not dry.  Check the total fat in packaged foods. Avoid foods that have saturated fat or trans fats.  Check the ingredients list for added sugars, such as corn syrup. Shopping  At the grocery store, buy most of your food from the areas near the walls of the store. This includes:  Fresh fruits and vegetables (produce).  Grains, beans, nuts, and seeds. Some of these may be available in unpackaged forms or large amounts (in bulk).  Fresh seafood.  Poultry and eggs.  Low-fat dairy products.  Buy whole ingredients instead of prepackaged foods.  Buy fresh fruits and vegetables in-season from local farmers markets.  Buy frozen fruits and vegetables in resealable bags.  If you do not have access to quality fresh seafood, buy precooked frozen shrimp or canned fish, such as tuna, salmon, or sardines.  Buy small amounts of raw or cooked vegetables, salads, or olives from the  deli or salad bar at your store.  Stock your pantry so you always have certain foods on hand, such as olive oil, canned tuna, canned tomatoes, rice, pasta, and beans. Cooking  Cook foods with extra-virgin olive oil instead of using butter or other vegetable oils.  Have meat as a side dish, and have vegetables or grains as your main dish. This means having meat in small portions or adding small amounts of meat to foods like pasta or stew.  Use beans or vegetables instead of meat in common dishes like chili or lasagna.  Experiment with different cooking methods. Try roasting or broiling vegetables instead of steaming or sauteing them.  Add frozen vegetables to soups, stews, pasta, or rice.  Add nuts or seeds for added healthy fat at each meal. You can add these to yogurt, salads, or vegetable dishes.  Marinate fish or vegetables using olive oil, lemon juice, garlic, and fresh herbs. Meal planning  Plan to eat 1 vegetarian meal one day each week. Try to work up to 2 vegetarian meals, if possible.  Eat seafood 2 or more times a week.  Have healthy snacks readily available, such as:  Vegetable sticks with hummus.  Greek yogurt.  Fruit and nut trail mix.  Eat balanced meals throughout the week. This includes:  Fruit: 2-3 servings a day  Vegetables: 4-5 servings a day  Low-fat dairy: 2 servings a day  Fish, poultry, or lean meat: 1 serving a day  Beans and legumes: 2 or more servings a week  Nuts   and seeds: 1-2 servings a day  Whole grains: 6-8 servings a day  Extra-virgin olive oil: 3-4 servings a day  Limit red meat and sweets to only a few servings a month What are my food choices?  Mediterranean diet  Recommended  Grains: Whole-grain pasta. Brown rice. Bulgar wheat. Polenta. Couscous. Whole-wheat bread. Oatmeal. Quinoa.  Vegetables: Artichokes. Beets. Broccoli. Cabbage. Carrots. Eggplant. Green beans. Chard. Kale. Spinach. Onions. Leeks. Peas. Squash.  Tomatoes. Peppers. Radishes.  Fruits: Apples. Apricots. Avocado. Berries. Bananas. Cherries. Dates. Figs. Grapes. Lemons. Melon. Oranges. Peaches. Plums. Pomegranate.  Meats and other protein foods: Beans. Almonds. Sunflower seeds. Pine nuts. Peanuts. Cod. Salmon. Scallops. Shrimp. Tuna. Tilapia. Clams. Oysters. Eggs.  Dairy: Low-fat milk. Cheese. Greek yogurt.  Beverages: Water. Red wine. Herbal tea.  Fats and oils: Extra virgin olive oil. Avocado oil. Grape seed oil.  Sweets and desserts: Greek yogurt with honey. Baked apples. Poached pears. Trail mix.  Seasoning and other foods: Basil. Cilantro. Coriander. Cumin. Mint. Parsley. Sage. Rosemary. Tarragon. Garlic. Oregano. Thyme. Pepper. Balsalmic vinegar. Tahini. Hummus. Tomato sauce. Olives. Mushrooms.  Limit these  Grains: Prepackaged pasta or rice dishes. Prepackaged cereal with added sugar.  Vegetables: Deep fried potatoes (french fries).  Fruits: Fruit canned in syrup.  Meats and other protein foods: Beef. Pork. Lamb. Poultry with skin. Hot dogs. Bacon.  Dairy: Ice cream. Sour cream. Whole milk.  Beverages: Juice. Sugar-sweetened soft drinks. Beer. Liquor and spirits.  Fats and oils: Butter. Canola oil. Vegetable oil. Beef fat (tallow). Lard.  Sweets and desserts: Cookies. Cakes. Pies. Candy.  Seasoning and other foods: Mayonnaise. Premade sauces and marinades.  The items listed may not be a complete list. Talk with your dietitian about what dietary choices are right for you. Summary  The Mediterranean diet includes both food and lifestyle choices.  Eat a variety of fresh fruits and vegetables, beans, nuts, seeds, and whole grains.  Limit the amount of red meat and sweets that you eat.  Talk with your health care provider about whether it is safe for you to drink red wine in moderation. This means 1 glass a day for nonpregnant women and 2 glasses a day for men. A glass of wine equals 5 oz (150 mL). This information  is not intended to replace advice given to you by your health care provider. Make sure you discuss any questions you have with your health care provider. Document Released: 10/20/2015 Document Revised: 11/22/2015 Document Reviewed: 10/20/2015 Elsevier Interactive Patient Education  2017 [employer]  

## 2016-05-26 ENCOUNTER — Encounter: Payer: Self-pay | Admitting: Family Medicine

## 2016-05-29 ENCOUNTER — Ambulatory Visit (INDEPENDENT_AMBULATORY_CARE_PROVIDER_SITE_OTHER): Payer: Medicare Other | Admitting: Pulmonary Disease

## 2016-05-29 ENCOUNTER — Encounter: Payer: Self-pay | Admitting: Pulmonary Disease

## 2016-05-29 DIAGNOSIS — G4733 Obstructive sleep apnea (adult) (pediatric): Secondary | ICD-10-CM

## 2016-05-29 DIAGNOSIS — J449 Chronic obstructive pulmonary disease, unspecified: Secondary | ICD-10-CM | POA: Diagnosis not present

## 2016-05-29 NOTE — Assessment & Plan Note (Signed)
Asymptomatic, does not need medications

## 2016-05-29 NOTE — Progress Notes (Signed)
   Subjective:    Patient ID: Steven Shepard, male    DOB: 02/08/1946, 70 y.o.   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    05/29/2016  Feels well rested in a.m., has changed to nasal mask and likes it. He got a new air mini CPAP this year and traveled with this.  Feels refreshed, no mask or pressure issues I reviewed download which confirms excellent compliance and good control of events of 9 cm with minimal leak in spite of change to nasal mask  A. Fib has been rate controlled, remains on coumadin,  On metoprolol, cardizem & clonidine for rate control  On Lasix 54mdaily for mild peripheral edema   Significant tests/ events  PSG 09/2010  severe obstructive sleep apnea with AHI 86/h corrected by CPAP 9 cm  Maintained on CPAP 9cm, small nasal comfort gel mask,   2010 Spirometry FEV1 83%, ratio 69   Review of Systems neg for any significant sore throat, dysphagia, itching, sneezing, nasal congestion or excess/ purulent secretions, fever, chills, sweats, unintended wt loss, pleuritic or exertional cp, hempoptysis, orthopnea pnd or change in chronic leg swelling. Also denies presyncope, palpitations, heartburn, abdominal pain, nausea, vomiting, diarrhea or change in bowel or urinary habits, dysuria,hematuria, rash, arthralgias, visual complaints, headache, numbness weakness or ataxia.     Objective:   Physical Exam  Gen. Pleasant, well-nourished, in no distress ENT - no lesions, no post nasal drip Neck: No JVD, no thyromegaly, no carotid bruits Lungs: no use of accessory muscles, no dullness to percussion, clear without rales or rhonchi  Cardiovascular: Rhythm regular, heart sounds  normal, no murmurs or gallops, no peripheral edema Musculoskeletal: No deformities, no cyanosis or clubbing        Assessment & Plan:

## 2016-05-29 NOTE — Assessment & Plan Note (Signed)
CPAP is set at 9 cm and is working well. CPAP supplies will be renewed for a year  Weight loss encouraged, compliance with goal of at least 4-6 hrs every night is the expectation. Advised against medications with sedative side effects Cautioned against driving when sleepy - understanding that sleepiness will vary on a day to day basis

## 2016-05-29 NOTE — Patient Instructions (Signed)
CPAP is set at 9 cm and is working well. CPAP supplies will be renewed for a year

## 2016-05-30 ENCOUNTER — Ambulatory Visit (INDEPENDENT_AMBULATORY_CARE_PROVIDER_SITE_OTHER): Payer: Medicare Other | Admitting: *Deleted

## 2016-05-30 DIAGNOSIS — I4891 Unspecified atrial fibrillation: Secondary | ICD-10-CM | POA: Diagnosis not present

## 2016-05-30 LAB — POCT INR: INR: 1.8

## 2016-06-12 ENCOUNTER — Ambulatory Visit (INDEPENDENT_AMBULATORY_CARE_PROVIDER_SITE_OTHER): Payer: Medicare Other | Admitting: *Deleted

## 2016-06-12 DIAGNOSIS — I4891 Unspecified atrial fibrillation: Secondary | ICD-10-CM

## 2016-06-12 LAB — POCT INR: INR: 2.5

## 2016-06-16 ENCOUNTER — Other Ambulatory Visit: Payer: Self-pay | Admitting: Cardiology

## 2016-07-03 ENCOUNTER — Encounter: Payer: Self-pay | Admitting: Cardiology

## 2016-07-04 DIAGNOSIS — D1801 Hemangioma of skin and subcutaneous tissue: Secondary | ICD-10-CM | POA: Diagnosis not present

## 2016-07-04 DIAGNOSIS — D225 Melanocytic nevi of trunk: Secondary | ICD-10-CM | POA: Diagnosis not present

## 2016-07-04 DIAGNOSIS — Z85828 Personal history of other malignant neoplasm of skin: Secondary | ICD-10-CM | POA: Diagnosis not present

## 2016-07-04 DIAGNOSIS — L570 Actinic keratosis: Secondary | ICD-10-CM | POA: Diagnosis not present

## 2016-07-04 DIAGNOSIS — L820 Inflamed seborrheic keratosis: Secondary | ICD-10-CM | POA: Diagnosis not present

## 2016-07-04 DIAGNOSIS — L853 Xerosis cutis: Secondary | ICD-10-CM | POA: Diagnosis not present

## 2016-07-04 DIAGNOSIS — D485 Neoplasm of uncertain behavior of skin: Secondary | ICD-10-CM | POA: Diagnosis not present

## 2016-07-04 DIAGNOSIS — L430 Hypertrophic lichen planus: Secondary | ICD-10-CM | POA: Diagnosis not present

## 2016-07-11 ENCOUNTER — Ambulatory Visit (INDEPENDENT_AMBULATORY_CARE_PROVIDER_SITE_OTHER): Payer: Medicare Other | Admitting: Pharmacist

## 2016-07-11 DIAGNOSIS — I4891 Unspecified atrial fibrillation: Secondary | ICD-10-CM

## 2016-07-11 LAB — POCT INR: INR: 2.4

## 2016-07-17 ENCOUNTER — Other Ambulatory Visit: Payer: Self-pay | Admitting: Cardiology

## 2016-07-24 ENCOUNTER — Ambulatory Visit (INDEPENDENT_AMBULATORY_CARE_PROVIDER_SITE_OTHER): Payer: Medicare Other | Admitting: Cardiology

## 2016-07-24 ENCOUNTER — Encounter: Payer: Self-pay | Admitting: Cardiology

## 2016-07-24 ENCOUNTER — Ambulatory Visit (INDEPENDENT_AMBULATORY_CARE_PROVIDER_SITE_OTHER): Payer: Medicare Other | Admitting: *Deleted

## 2016-07-24 VITALS — BP 120/66 | HR 65 | Ht 72.0 in | Wt 247.4 lb

## 2016-07-24 DIAGNOSIS — R600 Localized edema: Secondary | ICD-10-CM

## 2016-07-24 DIAGNOSIS — I110 Hypertensive heart disease with heart failure: Secondary | ICD-10-CM

## 2016-07-24 DIAGNOSIS — I350 Nonrheumatic aortic (valve) stenosis: Secondary | ICD-10-CM | POA: Diagnosis not present

## 2016-07-24 DIAGNOSIS — E7849 Other hyperlipidemia: Secondary | ICD-10-CM

## 2016-07-24 DIAGNOSIS — E784 Other hyperlipidemia: Secondary | ICD-10-CM

## 2016-07-24 DIAGNOSIS — I4891 Unspecified atrial fibrillation: Secondary | ICD-10-CM

## 2016-07-24 DIAGNOSIS — I480 Paroxysmal atrial fibrillation: Secondary | ICD-10-CM | POA: Diagnosis not present

## 2016-07-24 LAB — POCT INR: INR: 2.5

## 2016-07-24 MED ORDER — POTASSIUM CHLORIDE CRYS ER 20 MEQ PO TBCR: 20.0000 meq | EXTENDED_RELEASE_TABLET | Freq: Two times a day (BID) | ORAL | 1 refills | Status: DC

## 2016-07-24 NOTE — Patient Instructions (Signed)
Medication Instructions:   Your physician recommends that you continue on your current medications as directed. Please refer to the Current Medication list given to you today.    Follow-Up:  Your physician wants you to follow-up in: [place] will receive a reminder letter in the mail two months in advance. If you don't receive a letter, please call our office to schedule the follow-up appointment.        If you need a refill on your cardiac medications before your next appointment, please call your pharmacy.

## 2016-07-24 NOTE — Progress Notes (Signed)
Patient ID: Winter Trefz, male   DOB: January 19, 1946, 71 y.o.   MRN: [id number]    Patient Name: Steven Shepard Date of Encounter: 07/24/2016  Primary Care Provider:  Briscoe Deutscher, DO Primary Cardiologist:  Steven Shepard  Problem List   Past Medical History:  Diagnosis Date  . Anemia, hemolytic ([place]) 01/22/2011  . Atrial fibrillation ([place])   . 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 COPD (chronic obstructive pulmonary disease) ([place])   . DOE (dyspnea on exertion)   . History of neck surgery   . Hyperlipidemia   . Hypertension   . OSA on CPAP   . Umbilical hernia    Past Surgical History:  Procedure Laterality Date  . ADENOIDECTOMY    . HIATAL HERNIA REPAIR  2011  . NECK SURGERY  2005  . SKIN SURGERY     skin cancer  . TONSILLECTOMY    . TOTAL HIP ARTHROPLASTY Right 09/18/2010   Steven Shepard    Allergies  Allergies  Allergen Reactions  . Ace Inhibitors Other (See Comments)    Other reaction(s): Other (See Comments) High K+  . Codeine     Other reaction(s): Other (See Comments) Hallucinations  . Spironolactone     Increases potassium   Chief complain: lower extremity edema  HPI Steven Shepard is being followed in our clinic for paroxysmal A. fib and difficult to control hypertension. He also is hyperlipidemia which is being followed by Steven Shepard. He states that he has palpitations especially after few glasses of wine but not more than once every couple of months. He does check his blood pressure at home and is normal. He is working out in the gym 2 days a week. Weight has been stable but he has not lost any. He's very compliant with his medications. No chest pain or SOB, but fatigue with exercise. Muscle pain (harmstring), worse at rest. He has been seen by GI for fatty liver and gallbladder polyps.  The patient has elevated blood sugar, he is starting diet under dietitian (his brother in law) and Is exercising on and off.  12/02/2015 - This is 6 months  follow-up, patient states that he has no chest pain shortness of breath palpitations or syncope. He has occasional lower extremity edema mostly toward the end of the day.  No recent palpitations. No orthopnea or proximal nocturnal dyspnea. He has been compliant with his meds. He admits that his diet hasn't been ideal, and that he doesn't exercise.  07/24/2016 - 6 months follow-up, the patient is doing great and denies any chest pain or shortness of breath, he walks daily with his dog and exercises with a personal trainer twice a week. He continues to have alcoholic beverages 3-4 times a week and gets palpitations that last about 15-20 minutes when he hasn't drink. Those are not associated with dizziness presyncope or syncope. He has been compliant with warfarin. He started Mediterranean-style diet however doesn't follow with 100% and still eats a lot ofmeat and carbs.   PE Blood pressure 142/74, weight 279 pounds, pulse 66. General Appearance: well developed, well nourished in no acute distress, muscular, overweight HEENT: symmetrical face, PERRLA, good dentition  Neck: no JVD, thyromegaly, or adenopathy, trachea midline Chest: symmetric without deformity Cardiac: PMI non-displaced, RRR, normal S1, S2, soft systolic murmur left upper sternal border with normal splitting of S2, no diastolic component Lung: clear to ausculation and percussion Vascular: all pulses full without bruits  Abdominal: nondistended, nontender, good bowel sounds, no HSM, no bruits Extremities:  no cyanosis, clubbing, there is 2+ feeding bilateral lower extremity edema up to his mid calves, no sign of DVT, no varicosities  Skin: normal color, no rashes Neuro: alert and oriented x 3, non-focal Pysch: normal affect  EKG Performed today 07/24/2016 showed sinus rhythm first-degree AV block unchanged from prior.  Home Medications  Prior to Admission medications   Medication Sig Start Date End Date Taking? Authorizing Provider   acetaminophen (TYLENOL) 650 MG CR tablet Take 650 mg by mouth 2 (two) times daily as needed.     Yes Historical Provider, MD  aspirin 81 MG tablet Take 81 mg by mouth daily.     Yes Historical Provider, MD  atorvastatin (LIPITOR) 20 MG tablet Take 20 mg by mouth daily.     Yes Historical Provider, MD  cloNIDine (CATAPRES) 0.3 MG tablet Take 1 tablet (0.3 mg total) by mouth 2 (two) times daily. 06/17/12  Yes Steven Cunas, MD  diltiazem (CARDIZEM CD) 360 MG 24 hr capsule Take 1 capsule (360 mg total) by mouth daily. 06/17/12  Yes Steven Cunas, MD  Fexofenadine HCl (ALLEGRA PO) Take by mouth every morning.   Yes Historical Provider, MD  folic acid (FOLVITE) 356 MCG tablet Take 400 mcg by mouth 2 (two) times daily.   Yes Historical Provider, MD  metoprolol (TOPROL-XL) 50 MG 24 hr tablet Take 50 mg by mouth 2 (two) times daily.    Yes Historical Provider, MD  Multiple Vitamin (MULTIVITAMIN) capsule Take 1 capsule by mouth daily.     Yes Historical Provider, MD  ranitidine (ZANTAC) 75 MG tablet Take 75 mg by mouth 2 (two) times daily as needed.   Yes Historical Provider, MD  Tamsulosin HCl (FLOMAX) 0.4 MG CAPS Take 0.4 mg by mouth daily.     Yes Historical Provider, MD  tiotropium (SPIRIVA) 18 MCG inhalation capsule Place 18 mcg into inhaler and inhale daily.     Yes Historical Provider, MD  warfarin (COUMADIN) 5 MG tablet TAKE AS DIRECTED BY COUMADIN CLINIC 02/02/13  Yes Steven Spark, MD    Family History  Family History  Problem Relation Age of Onset  . Coronary artery disease Mother   . Hypertension Mother   . Heart attack Mother   . Coronary artery disease Father   . Hypertension Father   . Heart attack Father   . Healthy Sister   . Liver disease Brother   . Diabetes Brother   . Cancer Brother   . Diabetes Sister   . Hypertension Sister   . Colon cancer Neg Hx   . Colonic polyp Neg Hx     Social History  Social History   Social History  . Marital status: Married    Spouse  name: N/A  . Number of children: N/A  . Years of education: N/A   Occupational History  . [employer]   Social History Main Topics  . Smoking status: Former Smoker    Packs/day: 1.50    Years: 3.00    Types: Cigarettes, Pipe    Quit date: 03/12/1973  . Smokeless tobacco: Never Used     Comment: quit appox 36 years ago  . Alcohol use Yes     Comment: occasionally, 1-7 drinks per week  . Drug use: No  . Sexual activity: Yes   Other Topics Concern  . Not on file   Social History Narrative  . No narrative on file     Review of Systems, as per HPI,  otherwise negative General:  No chills, fever, night sweats or weight changes.  Cardiovascular:  No chest pain, dyspnea on exertion, edema, orthopnea, palpitations, paroxysmal nocturnal dyspnea. Dermatological: No rash, lesions/masses Respiratory: No cough, dyspnea Urologic: No hematuria, dysuria Abdominal:   No nausea, vomiting, diarrhea, bright red blood per rectum, melena, or hematemesis Neurologic:  No visual changes, wkns, changes in mental status. All other systems reviewed and are otherwise negative except as noted above.  Physical Exam  Blood pressure 120/66, pulse 65, height 6' (1.829 m), weight 247 lb 6.4 oz (112.2 kg), SpO2 97 %.  General: Pleasant, NAD Psych: Normal affect. Neuro: Alert and oriented X 3. Moves all extremities spontaneously. HEENT: Normal  Neck: Supple without bruits or JVD. Lungs:  Resp regular and unlabored, CTA. Heart: RRR no s3, s4, 3/6 holosystolic murmur. Abdomen: Soft, non-tender, non-distended, BS + x 4.  Extremities: No clubbing, cyanosis, mild chronic non-pitting bilateral lower extremity edema. DP/PT/Radials 2+ and equal bilaterally.  Labs:  No results for input(s): CKTOTAL, CKMB, TROPONINI in the last 72 hours. Lab Results  Component Value Date   WBC 6.9 02/16/2015   HGB 13.7 02/16/2015   HCT 38.6 (L) 02/16/2015   MCV 93.7 02/16/2015   PLT 246 02/16/2015   Accessory  Clinical Findings  Echocardiogram - 09/16/2013 Left ventricle: The cavity size was normal. Wall thickness was increased in a pattern of mild LVH. Systolic function was normal. The estimated ejection fraction was in the range of 55% to 60%. Wall motion was normal; there were no regional wall motion abnormalities. - Left atrium: The atrium was mildly dilated. - Atrial septum: No defect or patent foramen ovale was identified.  ECG - normal sinus rhythm, 66 bpm, normal EKG unchanged from prior.    Assessment & Plan  1. Lower extremity edema - no other evidence of heart failure, BNP cpreviously 20. He's advised to cut down on salt, use compression stockings at night in summer. Continue lasix 40 mg po BID.  2. Hypertension - controlled.  3. Hyperlipidemia - followed by his PCP Dr Greta Doom, on atorvastatin 20 mg daily, normal LFTs, elevated TG, advised about cutting down etoh and carbs.  4. Paroxysmal A. Fib - currently in sinus rhythm on chronic anticoagulation with warfarin. No bleeding, he gets episodes of a-fib when he has an extra drink. Advised to cut down. Compliant with warfarin.  5. Mild aortic stenosis - on echo in 12/2015, we will follow.  Follow-up in 6 months.  Steven Dawley, MD, [facility] 07/24/2016, 8:06 AM

## 2016-07-27 DIAGNOSIS — H04123 Dry eye syndrome of bilateral lacrimal glands: Secondary | ICD-10-CM | POA: Diagnosis not present

## 2016-07-27 LAB — HM DIABETES EYE EXAM

## 2016-08-28 ENCOUNTER — Ambulatory Visit (INDEPENDENT_AMBULATORY_CARE_PROVIDER_SITE_OTHER): Payer: Medicare Other | Admitting: *Deleted

## 2016-08-28 DIAGNOSIS — I4891 Unspecified atrial fibrillation: Secondary | ICD-10-CM

## 2016-08-28 LAB — POCT INR: INR: 3.1

## 2016-08-29 ENCOUNTER — Encounter: Payer: Self-pay | Admitting: Family Medicine

## 2016-08-29 ENCOUNTER — Ambulatory Visit (INDEPENDENT_AMBULATORY_CARE_PROVIDER_SITE_OTHER): Payer: Medicare Other | Admitting: Family Medicine

## 2016-08-29 VITALS — BP 122/76 | HR 49 | Temp 98.7°F | Ht 72.0 in | Wt 252.0 lb

## 2016-08-29 DIAGNOSIS — R7303 Prediabetes: Secondary | ICD-10-CM | POA: Diagnosis not present

## 2016-08-29 DIAGNOSIS — E669 Obesity, unspecified: Secondary | ICD-10-CM | POA: Diagnosis not present

## 2016-08-29 DIAGNOSIS — I10 Essential (primary) hypertension: Secondary | ICD-10-CM | POA: Diagnosis not present

## 2016-08-29 DIAGNOSIS — K7581 Nonalcoholic steatohepatitis (NASH): Secondary | ICD-10-CM | POA: Diagnosis not present

## 2016-08-29 DIAGNOSIS — R7309 Other abnormal glucose: Secondary | ICD-10-CM | POA: Diagnosis not present

## 2016-08-29 LAB — HEMOGLOBIN A1C: Hgb A1c MFr Bld: 6.2 % (ref 4.6–6.5)

## 2016-08-29 MED ORDER — METFORMIN HCL 500 MG PO TABS: 500.0000 mg | ORAL_TABLET | Freq: Two times a day (BID) | ORAL | 3 refills | Status: DC

## 2016-08-29 NOTE — Progress Notes (Signed)
Steven Shepard is a 71 y.o. male is here for follow up.  History of Present Illness:   Water quality scientist, CMA, acting as scribe for Dr. Juleen China.  HPI   Diabetes: Current symptoms: no polyuria or polydipsia, no chest pain, dyspnea or TIA's, no numbness, tingling or pain in extremities.  Episodes of hypoglycemia? []   YES  [x]   NO Maintaining a diabetic diet? []   YES  [x]   NO Trying to exercise on a regular basis? []   YES  [x]   NO  On ACE inhibitor or angiotensin II receptor blocker? []   YES  []   NO On Aspirin? [x]   YES  []   NO  Lab Results  Component Value Date   HGBA1C 6.2 08/29/2016    Lab Results  Component Value Date   MICROALBUR 1.7 05/16/2016    Lab Results  Component Value Date   CHOL 160 05/16/2016   HDL 33.40 (L) 05/16/2016   LDLCALC 88 05/16/2016   TRIG 196.0 (H) 05/16/2016   CHOLHDL 5 05/16/2016     Wt Readings from Last 3 Encounters:  08/29/16 252 lb (114.3 kg)  07/24/16 247 lb 6.4 oz (112.2 kg)  05/29/16 253 lb (114.8 kg)   Reports that he quit smoking about 43 years ago. His smoking use included Cigarettes and Pipe. He has a 4.50 pack-year smoking history. He has never used smokeless tobacco.  BP Readings from Last 3 Encounters:  08/29/16 122/76  07/24/16 120/66  05/29/16 118/70   Lab Results  Component Value Date   CREATININE 0.95 05/16/2016   Health Maintenance Due  Topic Date Due  . Hepatitis C Screening  March 03, 1946  . FOOT EXAM  02/08/1957  . OPHTHALMOLOGY EXAM  02/08/1957  . TETANUS/TDAP  11/21/2015   PMHx, SurgHx, SocialHx, FamHx, Medications, and Allergies were reviewed in the Visit Navigator and updated as appropriate.   Patient Active Problem List   Diagnosis Date Noted  . Gallstone 04/26/2016  . NASH (nonalcoholic steatohepatitis) 04/26/2016  . Obesity (BMI 30.0-34.9) 04/26/2016  . Osteoarthrosis involving lower leg 05/21/2015  . Acute diastolic heart failure ([place]) 02/16/2015  . Benign non-nodular prostatic hyperplasia 01/11/2014   . Lower extremity edema 08/24/2013  . 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 Systolic murmur of aorta 18/56/3149  . Elevated prostate specific antigen (PSA) 01/10/2012  . 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 Anemia, hemolytic ([place]) 01/22/2011  . Edema 12/26/2010  . Long term (current) use of anticoagulants 06/20/2010  . PAF (paroxysmal atrial fibrillation) ([place]) 06/09/2010  . Atherosclerosis of native arteries of extremity with rest pain ([place]) 02/14/2010  . Spinal stenosis of lumbar region with neurogenic claudication 02/14/2010  . Thoracic or lumbosacral neuritis or radiculitis, unspecified 01/10/2010  . Chronic obstructive pulmonary disease ([place]) 08/10/2009  . Benign essential hypertension 12/15/2008  . Obstructive sleep apnea syndrome 03/02/2008  . Hyperlipidemia 03/01/2008  . Gastroesophageal reflux disease with esophagitis 11/27/2007   Social History  Substance Use Topics  . Smoking status: Former Smoker    Packs/day: 1.50    Years: 3.00    Types: Cigarettes, Pipe    Quit date: 03/12/1973  . Smokeless tobacco: Never Used     Comment: quit appox 36 years ago  . Alcohol use Yes     Comment: occasionally, 1-7 drinks per week   Current Medications and Allergies:   Current Outpatient Prescriptions:  .  acetaminophen (TYLENOL) 650 MG CR tablet, Take 650 mg by mouth 2 (two) times daily as needed for pain. , Disp: , Rfl:  .  aspirin 81 MG tablet, Take 81 mg by mouth daily.  , Disp: , Rfl:  .  atorvastatin (LIPITOR) 20 MG tablet, Take 20 mg by mouth daily.  , Disp: , Rfl:  .  CARTIA XT 180 MG 24 hr capsule, TAKE 2 CAPSULES BY MOUTH EVERY DAY, Disp: 180 capsule, Rfl: 1 .  cloNIDine (CATAPRES) 0.3 MG tablet, TAKE 1 TABLET BY MOUTH TWICE DAILY, Disp: 180 tablet, Rfl: 3 .  Fexofenadine HCl (ALLEGRA PO), Take 1 tablet by mouth every morning. , Disp: , Rfl:  .  folic acid (FOLVITE) 462 MCG tablet, Take 400 mcg by mouth 2 (two) times daily., Disp: , Rfl:  .  furosemide (LASIX) 40 MG  tablet, TAKE 1 TABLET BY MOUTH TWICE DAILY, TAKE AT 8AM AND AT 2PM, Disp: 180 tablet, Rfl: 2 .  metoprolol succinate (TOPROL-XL) 50 MG 24 hr tablet, Take 1 tablet (50 mg total) by mouth 2 (two) times daily., Disp: 60 tablet, Rfl: 9 .  Multiple Vitamin (MULTIVITAMIN) capsule, Take 1 capsule by mouth daily.  , Disp: , Rfl:  .  Omega-3 Fatty Acids (FISH OIL) 1000 MG CAPS, Take 1 capsule by mouth daily. , Disp: , Rfl:  .  potassium chloride SA (K-DUR,KLOR-CON) 20 MEQ tablet, Take 1 tablet (20 mEq total) by mouth 2 (two) times daily., Disp: 180 tablet, Rfl: 1 .  ranitidine (ZANTAC) 75 MG tablet, Take 75 mg by mouth as needed for heartburn. , Disp: , Rfl:  .  Tamsulosin HCl (FLOMAX) 0.4 MG CAPS, Take 0.4 mg by mouth daily.  , Disp: , Rfl:  .  warfarin (COUMADIN) 5 MG tablet, TAKE AS DIRECTED BY COUMADIN CLINIC, Disp: 30 tablet, Rfl: 3 .  metFORMIN (GLUCOPHAGE) 500 MG tablet, Take 1 tablet (500 mg total) by mouth 2 (two) times daily with a meal., Disp: 180 tablet, Rfl: 3  Allergies  Allergen Reactions  . Ace Inhibitors Other (See Comments)    Other reaction(s): Other (See Comments) High K+  . Codeine     Other reaction(s): Other (See Comments) Hallucinations  . Spironolactone     Increases potassium   Review of Systems   Review of Systems  Constitutional: Negative for chills and fever.  HENT: Negative for congestion, ear pain and sore throat.   Eyes: Negative for blurred vision and pain.  Respiratory: Negative for shortness of breath.   Cardiovascular: Negative for chest pain and palpitations.  Gastrointestinal: Negative for abdominal pain, nausea and vomiting.  Genitourinary: Negative for frequency.  Musculoskeletal: Negative for back pain and neck pain.  Skin: Negative for rash.  Neurological: Negative for dizziness, loss of consciousness, weakness and headaches.  Endo/Heme/Allergies: Does not bruise/bleed easily.  Psychiatric/Behavioral: Negative for depression. The patient is not  nervous/anxious.    Vitals:   Vitals:   08/29/16 1341  BP: 122/76  Pulse: (!) 49  Temp: 98.7 F (37.1 C)  TempSrc: Oral  SpO2: 98%  Weight: 252 lb (114.3 kg)  Height: 6' (1.829 m)     Body mass index is 34.18 kg/m.  Physical Exam:   Physical Exam  Constitutional: He is oriented to person, place, and time. He appears well-developed and well-nourished. No distress.  HENT:  Head: Normocephalic and atraumatic.  Right Ear: External ear normal.  Left Ear: External ear normal.  Nose: Nose normal.  Mouth/Throat: Oropharynx is clear and moist.  Eyes: Conjunctivae and EOM are normal. Pupils are equal, round, and reactive to light.  Neck: Normal range of motion. Neck supple.  Cardiovascular:  Normal rate, regular rhythm, normal heart sounds and intact distal pulses.   Pulmonary/Chest: Effort normal and breath sounds normal.  Abdominal: Soft. Bowel sounds are normal.  Musculoskeletal: Normal range of motion.  Neurological: He is alert and oriented to person, place, and time.  Skin: Skin is warm and dry.  Psychiatric: He has a normal mood and affect. His behavior is normal. Judgment and thought content normal.  Nursing note and vitals reviewed.   Diabetic Foot Exam - Simple   Simple Foot Form Diabetic Foot exam was performed with the following findings:  Yes 08/29/2016 11:51 AM  Visual Inspection No deformities, no ulcerations, no other skin breakdown bilaterally:  Yes Sensation Testing Intact to touch and monofilament testing bilaterally:  Yes Pulse Check Posterior Tibialis and Dorsalis pulse intact bilaterally:  Yes Comments    Results for orders placed or performed in visit on 08/29/16  Hemoglobin A1c  Result Value Ref Range   Hgb A1c MFr Bld 6.2 4.6 - 6.5 %   Assessment and Plan:   Steven Shepard was seen today for follow-up.  Diagnoses and all orders for this visit:  Prediabetes Comments: . Orders: -     metFORMIN (GLUCOPHAGE) 500 MG tablet; Take 1 tablet (500 mg  total) by mouth 2 (two) times daily with a meal. -     Hemoglobin A1c  Obesity (BMI 30.0-34.9) Comments: The patient is asked to make an attempt to improve diet and exercise patterns to aid in medical management of this problem.  Benign essential hypertension Comments: Well controlled.  No signs of complications, medication side effects, or red flags.  Continue current regimen.    NASH (nonalcoholic steatohepatitis) Comments: Will need new liver US for monitoring.   Lab Results  Component Value Date   ALT 28 05/16/2016   AST 17 05/16/2016   ALKPHOS 79 05/16/2016   BILITOT 1.6 (H) 05/16/2016    . Reviewed expectations re: course of current medical issues. . Discussed self-management of symptoms. . Outlined signs and symptoms indicating need for more acute intervention. . Patient verbalized understanding and all questions were answered. Marland Kitchen Health Maintenance issues including appropriate healthy diet, exercise, and smoking avoidance were discussed with patient. . See orders for this visit as documented in the electronic medical record. . Patient received an After Visit Summary.  CMA served as Education administrator during this visit. History, Physical, and Plan performed by medical provider. The above documentation has been reviewed and is accurate and complete. Briscoe Deutscher, D.O.  Briscoe Deutscher, DO [place] [place] 09/02/2016  Future Appointments Date Time Provider [place]  09/25/2016 1:30 PM [facility] None  10/02/2016 8:15 AM [facility]  11/29/2016 9:00 AM Briscoe Deutscher, DO [facility] None

## 2016-08-30 ENCOUNTER — Encounter: Payer: Self-pay | Admitting: Family Medicine

## 2016-09-06 ENCOUNTER — Encounter: Payer: Self-pay | Admitting: Family Medicine

## 2016-09-25 ENCOUNTER — Ambulatory Visit: Payer: Medicare Other

## 2016-09-26 DIAGNOSIS — L570 Actinic keratosis: Secondary | ICD-10-CM | POA: Diagnosis not present

## 2016-09-26 DIAGNOSIS — L814 Other melanin hyperpigmentation: Secondary | ICD-10-CM | POA: Diagnosis not present

## 2016-09-26 DIAGNOSIS — Z85828 Personal history of other malignant neoplasm of skin: Secondary | ICD-10-CM | POA: Diagnosis not present

## 2016-09-26 DIAGNOSIS — D225 Melanocytic nevi of trunk: Secondary | ICD-10-CM | POA: Diagnosis not present

## 2016-09-26 DIAGNOSIS — D2261 Melanocytic nevi of right upper limb, including shoulder: Secondary | ICD-10-CM | POA: Diagnosis not present

## 2016-09-26 DIAGNOSIS — D2372 Other benign neoplasm of skin of left lower limb, including hip: Secondary | ICD-10-CM | POA: Diagnosis not present

## 2016-09-26 DIAGNOSIS — L821 Other seborrheic keratosis: Secondary | ICD-10-CM | POA: Diagnosis not present

## 2016-09-26 DIAGNOSIS — D485 Neoplasm of uncertain behavior of skin: Secondary | ICD-10-CM | POA: Diagnosis not present

## 2016-10-01 ENCOUNTER — Other Ambulatory Visit: Payer: Self-pay | Admitting: Cardiology

## 2016-10-01 ENCOUNTER — Telehealth: Payer: Self-pay | Admitting: Family Medicine

## 2016-10-01 NOTE — Telephone Encounter (Signed)
LVM for pt to resch. Coumadin appt to any time 10/04/16 per [facility]

## 2016-10-01 NOTE — Telephone Encounter (Signed)
Patient has been rescheduled, no further action required.

## 2016-10-02 ENCOUNTER — Ambulatory Visit: Payer: Medicare Other

## 2016-10-02 DIAGNOSIS — M48062 Spinal stenosis, lumbar region with neurogenic claudication: Secondary | ICD-10-CM | POA: Diagnosis not present

## 2016-10-02 DIAGNOSIS — M4726 Other spondylosis with radiculopathy, lumbar region: Secondary | ICD-10-CM | POA: Diagnosis not present

## 2016-10-02 DIAGNOSIS — M546 Pain in thoracic spine: Secondary | ICD-10-CM | POA: Diagnosis not present

## 2016-10-02 DIAGNOSIS — M4316 Spondylolisthesis, lumbar region: Secondary | ICD-10-CM | POA: Diagnosis not present

## 2016-10-02 DIAGNOSIS — M5136 Other intervertebral disc degeneration, lumbar region: Secondary | ICD-10-CM | POA: Diagnosis not present

## 2016-10-02 DIAGNOSIS — M4722 Other spondylosis with radiculopathy, cervical region: Secondary | ICD-10-CM | POA: Diagnosis not present

## 2016-10-09 ENCOUNTER — Ambulatory Visit (INDEPENDENT_AMBULATORY_CARE_PROVIDER_SITE_OTHER): Payer: Medicare Other | Admitting: *Deleted

## 2016-10-09 DIAGNOSIS — Z5181 Encounter for therapeutic drug level monitoring: Secondary | ICD-10-CM

## 2016-10-09 LAB — POCT INR: INR: 2.6

## 2016-10-09 NOTE — Progress Notes (Signed)
I have reviewed this visit and I agree on the patient's plan of dosage and recommendations. Briscoe Deutscher, DO

## 2016-10-24 ENCOUNTER — Encounter: Payer: Self-pay | Admitting: Family Medicine

## 2016-10-27 ENCOUNTER — Other Ambulatory Visit: Payer: Self-pay | Admitting: Cardiology

## 2016-10-27 DIAGNOSIS — I5031 Acute diastolic (congestive) heart failure: Secondary | ICD-10-CM

## 2016-10-27 DIAGNOSIS — I480 Paroxysmal atrial fibrillation: Secondary | ICD-10-CM

## 2016-10-30 ENCOUNTER — Ambulatory Visit (INDEPENDENT_AMBULATORY_CARE_PROVIDER_SITE_OTHER): Payer: Medicare Other | Admitting: *Deleted

## 2016-10-30 DIAGNOSIS — I4891 Unspecified atrial fibrillation: Secondary | ICD-10-CM

## 2016-10-30 DIAGNOSIS — Z5181 Encounter for therapeutic drug level monitoring: Secondary | ICD-10-CM

## 2016-10-30 LAB — POCT INR: INR: 2.2

## 2016-10-31 ENCOUNTER — Other Ambulatory Visit: Payer: Self-pay | Admitting: Gastroenterology

## 2016-10-31 DIAGNOSIS — K7581 Nonalcoholic steatohepatitis (NASH): Secondary | ICD-10-CM

## 2016-10-31 DIAGNOSIS — Z7901 Long term (current) use of anticoagulants: Secondary | ICD-10-CM | POA: Diagnosis not present

## 2016-10-31 DIAGNOSIS — K8020 Calculus of gallbladder without cholecystitis without obstruction: Secondary | ICD-10-CM | POA: Diagnosis not present

## 2016-10-31 DIAGNOSIS — I480 Paroxysmal atrial fibrillation: Secondary | ICD-10-CM | POA: Diagnosis not present

## 2016-10-31 DIAGNOSIS — K824 Cholesterolosis of gallbladder: Secondary | ICD-10-CM | POA: Diagnosis not present

## 2016-10-31 NOTE — Progress Notes (Signed)
I have reviewed this visit and I agree on the patient's plan of dosage and recommendations. Briscoe Deutscher, DO

## 2016-11-07 ENCOUNTER — Ambulatory Visit
Admission: RE | Admit: 2016-11-07 | Discharge: 2016-11-07 | Disposition: A | Discharge status: Home / Self Care | Payer: Medicare Other | Source: Ambulatory Visit | Attending: Gastroenterology | Admitting: Gastroenterology

## 2016-11-07 DIAGNOSIS — K8020 Calculus of gallbladder without cholecystitis without obstruction: Secondary | ICD-10-CM | POA: Diagnosis not present

## 2016-11-07 DIAGNOSIS — K7581 Nonalcoholic steatohepatitis (NASH): Secondary | ICD-10-CM

## 2016-11-12 ENCOUNTER — Other Ambulatory Visit: Payer: Self-pay | Admitting: Cardiology

## 2016-11-25 DIAGNOSIS — Z23 Encounter for immunization: Secondary | ICD-10-CM | POA: Diagnosis not present

## 2016-11-29 ENCOUNTER — Ambulatory Visit (INDEPENDENT_AMBULATORY_CARE_PROVIDER_SITE_OTHER): Payer: Medicare Other | Admitting: *Deleted

## 2016-11-29 ENCOUNTER — Encounter: Payer: Self-pay | Admitting: Family Medicine

## 2016-11-29 ENCOUNTER — Ambulatory Visit (INDEPENDENT_AMBULATORY_CARE_PROVIDER_SITE_OTHER): Payer: Medicare Other | Admitting: Family Medicine

## 2016-11-29 VITALS — BP 114/60 | HR 63 | Temp 97.8°F | Ht 72.0 in | Wt 249.6 lb

## 2016-11-29 DIAGNOSIS — Z7901 Long term (current) use of anticoagulants: Secondary | ICD-10-CM | POA: Diagnosis not present

## 2016-11-29 DIAGNOSIS — E669 Obesity, unspecified: Secondary | ICD-10-CM

## 2016-11-29 DIAGNOSIS — R7309 Other abnormal glucose: Secondary | ICD-10-CM | POA: Diagnosis not present

## 2016-11-29 DIAGNOSIS — R7303 Prediabetes: Secondary | ICD-10-CM

## 2016-11-29 LAB — POCT INR: INR: 2.4

## 2016-11-29 LAB — HEMOGLOBIN A1C: Hgb A1c MFr Bld: 6 % (ref 4.6–6.5)

## 2016-11-29 NOTE — Progress Notes (Signed)
Steven Shepard is a 71 y.o. male is here for follow up.  History of Present Illness:   Steven Shepard CMA acting as scribe for Dr. Juleen China.  HPI: Patient comes in today for follow up. Patient stated that he has been tolerating and taking the Metformin that he was started on.   Prediabetes Current symptoms: no polyuria or polydipsia, no chest pain, dyspnea or TIA's, no numbness, tingling or pain in extremities.  Taking medication compliantly without noted sided effects [x]   YES  []   NO  Maintaining a diabetic diet? []   YES  [x]   NO Trying to exercise on a regular basis? []   YES  [x]   NO  On ACE inhibitor or angiotensin II receptor blocker? []   YES  [x]   NO On Aspirin? [x]   YES  []   NO  Lab Results  Component Value Date   HGBA1C 6.0 11/29/2016    Lab Results  Component Value Date   MICROALBUR 1.7 05/16/2016    Lab Results  Component Value Date   CHOL 160 05/16/2016   HDL 33.40 (L) 05/16/2016   LDLCALC 88 05/16/2016   TRIG 196.0 (H) 05/16/2016   CHOLHDL 5 05/16/2016     Wt Readings from Last 3 Encounters:  11/29/16 249 lb 9.6 oz (113.2 kg)  08/29/16 252 lb (114.3 kg)  07/24/16 247 lb 6.4 oz (112.2 kg)   BP Readings from Last 3 Encounters:  11/29/16 114/60  08/29/16 122/76  07/24/16 120/66   Lab Results  Component Value Date   CREATININE 0.95 05/16/2016   Health Maintenance Due  Topic Date Due  . Hepatitis C Screening  11-03-1945  . OPHTHALMOLOGY EXAM  02/08/1957  . TETANUS/TDAP  11/21/2015  . PNA vac Low Risk Adult (2 of 2 - PPSV23) 11/14/2016   Depression screen PHQ 2/9 04/26/2016 04/26/2016  Decreased Interest 0 0  Down, Depressed, Hopeless 0 0  PHQ - 2 Score 0 0   PMHx, SurgHx, SocialHx, FamHx, Medications, and Allergies were reviewed in the Visit Navigator and updated as appropriate.   Patient Active Problem List   Diagnosis Date Noted  . Gallstone 04/26/2016  . NASH (nonalcoholic steatohepatitis) 04/26/2016  . Obesity (BMI 30.0-34.9) 04/26/2016    . Osteoarthrosis involving lower leg 05/21/2015  . Acute diastolic heart failure ([place]) 02/16/2015  . Benign non-nodular prostatic hyperplasia 01/11/2014  . Lower extremity edema 08/24/2013  . Cryoglobulinemia ([place]) 01/09/2013  . Systolic murmur of aorta 16/12/9602  . Elevated prostate specific antigen (PSA) 01/10/2012  . 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 Anemia, hemolytic ([place]) 01/22/2011  . Edema 12/26/2010  . Long term (current) use of anticoagulants 06/20/2010  . PAF (paroxysmal atrial fibrillation) ([place]) 06/09/2010  . Atherosclerosis of native arteries of extremity with rest pain ([place]) 02/14/2010  . Spinal stenosis of lumbar region with neurogenic claudication 02/14/2010  . Thoracic or lumbosacral neuritis or radiculitis, unspecified 01/10/2010  . Chronic obstructive pulmonary disease ([place]) 08/10/2009  . Obstructive sleep apnea syndrome 03/02/2008  . Hyperlipidemia 03/01/2008  . Gastroesophageal reflux disease with esophagitis 11/27/2007   Social History  Substance Use Topics  . Smoking status: Former Smoker    Packs/day: 1.50    Years: 3.00    Types: Cigarettes, Pipe    Quit date: 03/12/1973  . Smokeless tobacco: Never Used     Comment: quit appox 36 years ago  . Alcohol use Yes     Comment: occasionally, 1-7 drinks per week   Current Medications and Allergies:   .  acetaminophen (TYLENOL) 650 MG CR tablet, Take 650 mg by mouth 2 (two) times daily as needed for pain. , Disp: , Rfl:  .  aspirin 81 MG tablet, Take 81 mg by mouth daily.  , Disp: , Rfl:  .  atorvastatin (LIPITOR) 20 MG tablet, Take 20 mg by mouth daily.  , Disp: , Rfl:  .  CARTIA XT 180 MG 24 hr capsule, TAKE 2 CAPSULES BY MOUTH EVERY DAY, Disp: 180 capsule, Rfl: 1 .  cloNIDine (CATAPRES) 0.3 MG tablet, TAKE 1 TABLET BY MOUTH TWICE DAILY, Disp: 180 tablet, Rfl: 3 .  Fexofenadine HCl (ALLEGRA PO), Take 1 tablet by mouth every morning. , Disp: , Rfl:  .  folic acid (FOLVITE) 825 MCG tablet, Take 400  mcg by mouth 2 (two) times daily., Disp: , Rfl:  .  furosemide (LASIX) 40 MG tablet, TAKE 1 TABLET BY MOUTH AT 8 AM AND 1 TABLET BY MOUTH AT 2 PM, Disp: 180 tablet, Rfl: 0 .  metFORMIN (GLUCOPHAGE) 500 MG tablet, Take 1 tablet (500 mg total) by mouth 2 (two) times daily with a meal., Disp: 180 tablet, Rfl: 3 .  metoprolol succinate (TOPROL-XL) 50 MG 24 hr tablet, Take 1 tablet (50 mg total) by mouth 2 (two) times daily., Disp: 180 tablet, Rfl: 3 .  Multiple Vitamin (MULTIVITAMIN) capsule, Take 1 capsule by mouth daily.  , Disp: , Rfl:  .  Omega-3 Fatty Acids (FISH OIL) 1000 MG CAPS, Take 1 capsule by mouth daily. , Disp: , Rfl:  .  potassium chloride SA (K-DUR,KLOR-CON) 20 MEQ tablet, Take 1 tablet (20 mEq total) by mouth 2 (two) times daily., Disp: 180 tablet, Rfl: 1 .  ranitidine (ZANTAC) 75 MG tablet, Take 75 mg by mouth as needed for heartburn. , Disp: , Rfl:  .  Tamsulosin HCl (FLOMAX) 0.4 MG CAPS, Take 0.4 mg by mouth daily.  , Disp: , Rfl:  .  warfarin (COUMADIN) 5 MG tablet, TAKE AS DIRECTED BY COUMADIN CLINIC, Disp: 30 tablet, Rfl: 3   Allergies  Allergen Reactions  . Ace Inhibitors Other (See Comments)    Other reaction(s): Other (See Comments) High K+  . Codeine     Other reaction(s): Other (See Comments) Hallucinations  . Spironolactone     High K+   Review of Systems   Pertinent items are noted in the HPI. Otherwise, ROS is negative.  Vitals:   Vitals:   11/29/16 0855  BP: 114/60  Pulse: 63  Temp: 97.8 F (36.6 C)  TempSrc: Oral  SpO2: 94%  Weight: 249 lb 9.6 oz (113.2 kg)  Height: 6' (1.829 m)     Body mass index is 33.85 kg/m.   Physical Exam:   Physical Exam  Constitutional: He is oriented to person, place, and time. He appears well-developed and well-nourished. No distress.  HENT:  Head: Normocephalic and atraumatic.  Right Ear: External ear normal.  Left Ear: External ear normal.  Nose: Nose normal.  Mouth/Throat: Oropharynx is clear and moist.    Eyes: Pupils are equal, round, and reactive to light. Conjunctivae and EOM are normal.  Neck: Normal range of motion. Neck supple.  Cardiovascular: Normal rate, regular rhythm, normal heart sounds and intact distal pulses.   Pulmonary/Chest: Effort normal and breath sounds normal.  Abdominal: Soft. Bowel sounds are normal.  Musculoskeletal: Normal range of motion.  Neurological: He is alert and oriented to person, place, and time.  Skin: Skin is warm and dry.  Psychiatric: He has a normal  mood and affect. His behavior is normal. Judgment and thought content normal.  Nursing note and vitals reviewed.  Results for orders placed or performed in visit on 11/29/16  Hemoglobin A1c  Result Value Ref Range   Hgb A1c MFr Bld 6.0 4.6 - 6.5 %   Assessment and Plan:   Diagnoses and all orders for this visit:  Elevated hemoglobin A1c Comments: Mild improvement. Will increase Metformin and recheck in 3-6 months.  Orders: -     Hemoglobin A1c  Obesity (BMI 30.0-34.9) Comments: The patient is asked to make an attempt to improve diet and exercise patterns to aid in medical management of this problem.    . Reviewed expectations re: course of current medical issues. . Discussed self-management of symptoms. . Outlined signs and symptoms indicating need for more acute intervention. . Patient verbalized understanding and all questions were answered. Marland Kitchen Health Maintenance issues including appropriate healthy diet, exercise, and smoking avoidance were discussed with patient. . See orders for this visit as documented in the electronic medical record. . Patient received an After Visit Summary.  CMA served as Education administrator during this visit. History, Physical, and Plan performed by medical provider. The above documentation has been reviewed and is accurate and complete. Briscoe Deutscher, D.O.  Briscoe Deutscher, DO [place] [place] 11/29/2016  Future Appointments Date Time Provider [place]   12/25/2016 9:00 AM Stephanie Acre, RN [facility] None  12/27/2016 9:00 AM [facility] None  02/28/2017 8:30 AM Briscoe Deutscher, DO [facility] None

## 2016-11-30 NOTE — Progress Notes (Signed)
I have reviewed this visit and I agree on the patient's plan of dosage and recommendations. Dimas Chyle, MD

## 2016-12-03 ENCOUNTER — Telehealth: Payer: Self-pay | Admitting: Family Medicine

## 2016-12-03 NOTE — Telephone Encounter (Signed)
Spoke with patient nothing further needed at this time.

## 2016-12-03 NOTE — Telephone Encounter (Signed)
Patient returning phone call from [place] about lab results. Call patient to advise.

## 2016-12-24 NOTE — Progress Notes (Signed)
PCP notes:   Health maintenance: Hep c screening: Future order placed.  Opthalmology: Dr Melissa Noon. Pt unsure of last date. Will call and request records. Tdap: Pt will check with insurance. PPSV 23: Dr Juleen China, please clarify date needed for this.   Abnormal screenings: None.   Patient concerns: None.   Nurse concerns: None.    Next PCP appt: 02/28/2017

## 2016-12-24 NOTE — Progress Notes (Signed)
Pre visit review using our clinic review tool, if applicable. No additional management support is needed unless otherwise documented below in the visit note. 

## 2016-12-24 NOTE — Progress Notes (Signed)
Subjective:   Steven Shepard is a 71 y.o. male who presents for an Initial Medicare Annual Wellness Visit.  Review of Systems  No ROS.  Medicare Wellness Visit. Additional risk factors are reflected in the social history.  Cardiac Risk Factors include: advanced age (>74mn, >>69women);diabetes mellitus;dyslipidemia;hypertension;male gender;obesity (BMI >30kg/m2)    Objective:    Today's Vitals   12/25/16 0926  BP: 120/70  Pulse: 68  Resp: 16  SpO2: 98%  Weight: 240 lb 6.4 oz (109 kg)  Height: 6' (1.829 m)   Body mass index is 32.6 kg/m.  Current Medications (verified) Outpatient Encounter Prescriptions as of 12/25/2016  Medication Sig  . acetaminophen (TYLENOL) 650 MG CR tablet Take 650 mg by mouth 2 (two) times daily as needed for pain.   .Marland Kitchenaspirin 81 MG tablet Take 81 mg by mouth daily.    .Marland Kitchenatorvastatin (LIPITOR) 20 MG tablet Take 20 mg by mouth daily.    .Marland KitchenCARTIA XT 180 MG 24 hr capsule TAKE 2 CAPSULES BY MOUTH EVERY DAY  . cloNIDine (CATAPRES) 0.3 MG tablet TAKE 1 TABLET BY MOUTH TWICE DAILY  . Fexofenadine HCl (ALLEGRA PO) Take 1 tablet by mouth every morning.   . folic acid (FOLVITE) 4161MCG tablet Take 400 mcg by mouth 2 (two) times daily.  . furosemide (LASIX) 40 MG tablet TAKE 1 TABLET BY MOUTH AT 8 AM AND 1 TABLET BY MOUTH AT 2 PM  . metFORMIN (GLUCOPHAGE) 500 MG tablet Take 1 tablet (500 mg total) by mouth 2 (two) times daily with a meal. (Patient taking differently: Take 500 mg by mouth 2 (two) times daily with a meal. )  . metoprolol succinate (TOPROL-XL) 50 MG 24 hr tablet Take 1 tablet (50 mg total) by mouth 2 (two) times daily.  . Multiple Vitamin (MULTIVITAMIN) capsule Take 1 capsule by mouth daily.    . Multiple Vitamins-Minerals (EMERGEN-C IMMUNE PO) Take by mouth daily.  . Omega-3 Fatty Acids (FISH OIL) 1000 MG CAPS Take 1 capsule by mouth daily.   . potassium chloride SA (K-DUR,KLOR-CON) 20 MEQ tablet Take 1 tablet (20 mEq total) by mouth 2 (two)  times daily.  . Psyllium (METAMUCIL FIBER PO) Take by mouth daily.  . ranitidine (ZANTAC) 75 MG tablet Take 75 mg by mouth as needed for heartburn.   . Tamsulosin HCl (FLOMAX) 0.4 MG CAPS Take 0.4 mg by mouth daily.    .Marland Kitchenwarfarin (COUMADIN) 5 MG tablet TAKE AS DIRECTED BY COUMADIN CLINIC   No facility-administered encounter medications on file as of 12/25/2016.     Allergies (verified) Ace inhibitors; Codeine; and Spironolactone   History: Past Medical History:  Diagnosis Date  . [place]emia, hemolytic ([place] 01/22/2011  . Atrial fibrillation ([place]   . 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[place] 01/23/2011  . COPD (chronic obstructive pulmonary disease) ([place]   . DOE (dyspnea on exertion)   . History of neck surgery   . Hyperlipidemia   . Hypertension   . OSA on CPAP   . Umbilical hernia    Past Surgical History:  Procedure Laterality Date  . ADENOIDECTOMY    . HIATAL HERNIA REPAIR  2011  . NECK SURGERY  2005  . SKIN SURGERY     skin cancer  . TONSILLECTOMY    . TOTAL HIP ARTHROPLASTY Right 09/18/2010   Dr. AMaureen Ralphs  Family History  Problem Relation Age of Onset  . Coronary artery disease Mother   . Hypertension Mother   . Heart attack Mother   .  Coronary artery disease Father   . Hypertension Father   . Heart attack Father   . Healthy Sister   . Liver disease Brother   . Diabetes Brother   . Cancer Brother   . Diabetes Sister   . Hypertension Sister   . Colon cancer Neg Hx   . Colonic polyp Neg Hx    Social History   Occupational History  . [employer]   Social History Main Topics  . Smoking status: Former Smoker    Packs/day: 1.50    Years: 3.00    Types: Cigarettes, Pipe    Quit date: 03/12/1973  . Smokeless tobacco: Never Used     Comment: quit appox 36 years ago  . Alcohol use Yes     Comment: occasionally, 1-7 drinks per week  . Drug use: No  . Sexual activity: Yes   Tobacco Counseling Counseling given: Not Answered   Activities of Daily Living In  your present state of health, do you have any difficulty performing the following activities: 12/25/2016  Hearing? N  Vision? N  Difficulty concentrating or making decisions? N  Walking or climbing stairs? N  Dressing or bathing? N  Doing errands, shopping? N  Preparing Food and eating ? N  Using the Toilet? N  In the past six months, have you accidently leaked urine? N  Do you have problems with loss of bowel control? N  Managing your Medications? N  Managing your Finances? N  Housekeeping or managing your Housekeeping? N  Some recent data might be hidden    Immunizations and Health Maintenance Immunization History  Administered Date(s) Administered  . Influenza Split 12/11/2011, 12/10/2012  . Influenza, High Dose Seasonal PF 01/09/2012, 01/09/2013, 01/11/2014, 12/14/2015  . Influenza-Unspecified 12/10/2013, 01/11/2014, 11/25/2016  . Pneumococcal Conjugate-13 11/15/2015  . Pneumococcal Polysaccharide-23 09/09/2009, 06/07/2010  . Tdap 11/20/2005  . Zoster 03/12/2010   Health Maintenance Due  Topic Date Due  . Hepatitis C Screening  16-Nov-1945  . OPHTHALMOLOGY EXAM  02/08/1957    Patient Care Team: Briscoe Deutscher, DO as PCP - General (Family Medicine) Sharyne Peach, MD as Consulting Physician (Ophthalmology) Dorothy Spark, MD as Consulting Physician (Cardiology) Jovita Gamma, MD as Consulting Physician (Neurosurgery) Laurence Spates, MD as Consulting Physician (Gastroenterology)  Indicate any recent [place] you may have received from other than [facility] providers in the past year (date may be approximate).    Assessment:   This is a routine wellness examination for [facility]. Physical assessment deferred to PCP.   Hearing/Vision screen Hearing Screening Comments: Able to hear conversational tones w/o difficulty. No issues reported.   Vision Screening Comments: Wears contacts. Dr Melissa Noon. Cannot recall last appt.   Dietary issues and exercise  activities discussed: Current Exercise Habits: Home exercise routine, Type of exercise: walking, Time (Minutes): 30, Frequency (Times/Week): 7, Weekly Exercise (Minutes/Week): 210, Intensity: Mild  Goals    . Reduce salt intake to 2 grams per day or less      Depression Screen PHQ 2/9 Scores 12/25/2016 04/26/2016 04/26/2016  PHQ - 2 Score 0 0 0    Fall Risk Fall Risk  12/25/2016 04/26/2016 04/26/2016 12/25/2013  Falls in the past year? No No No No    Cognitive Function:        Screening Tests Health Maintenance  Topic Date Due  . Hepatitis C Screening  15-Nov-1945  . OPHTHALMOLOGY EXAM  02/08/1957  . TETANUS/TDAP  12/25/2017 (Originally 11/21/2015)  . PNA vac Low Risk Adult (2  of 2 - PPSV23) 12/25/2017 (Originally 11/14/2016)  . URINE MICROALBUMIN  05/16/2017  . HEMOGLOBIN A1C  05/29/2017  . FOOT EXAM  08/29/2017  . COLONOSCOPY  03/12/2021  . INFLUENZA VACCINE  Completed        Plan:   Follow up with PCP as directed.  I have personally reviewed and noted the following in the patient's chart:   . Medical and social history . Use of alcohol, tobacco or illicit drugs  . Current medications and supplements . Functional ability and status . Nutritional status . Physical activity . Advanced directives . List of other physicians . Vitals . Screenings to include cognitive, depression, and falls . Referrals and appointments  In addition, I have reviewed and discussed with patient certain preventive protocols, quality metrics, and best practice recommendations. A written personalized care plan for preventive services as well as general preventive health recommendations were provided to patient.     Williemae Area, RN   12/25/2016

## 2016-12-25 ENCOUNTER — Encounter: Payer: Self-pay | Admitting: *Deleted

## 2016-12-25 ENCOUNTER — Ambulatory Visit (INDEPENDENT_AMBULATORY_CARE_PROVIDER_SITE_OTHER): Payer: Medicare Other | Admitting: *Deleted

## 2016-12-25 VITALS — BP 120/70 | HR 68 | Resp 16 | Ht 72.0 in | Wt 240.4 lb

## 2016-12-25 DIAGNOSIS — Z7901 Long term (current) use of anticoagulants: Secondary | ICD-10-CM | POA: Diagnosis not present

## 2016-12-25 DIAGNOSIS — Z0000 Encounter for general adult medical examination without abnormal findings: Secondary | ICD-10-CM | POA: Diagnosis not present

## 2016-12-25 DIAGNOSIS — Z1159 Encounter for screening for other viral diseases: Secondary | ICD-10-CM | POA: Diagnosis not present

## 2016-12-25 LAB — POCT INR: INR: 1.7

## 2016-12-25 NOTE — Addendum Note (Signed)
Addended by: Williemae Area on: 12/25/2016 10:50 AM   Modules accepted: Orders

## 2016-12-25 NOTE — Progress Notes (Signed)
I have personally reviewed the Medicare Annual Wellness Visit and agree with the assessment and plan.  Patient should receive his final PPSV23 after age 70 and at least 5 years since his last PPSV23 and 1 year since his last PCV13- this would make him due on 11/14/2016. He can receive this vaccine at any point.   Algis Greenhouse. Jerline Pain, MD 12/25/2016 12:09 PM

## 2016-12-25 NOTE — Progress Notes (Signed)
I have reviewed this visit and I agree on the patient's plan of dosage and recommendations. Dimas Chyle, MD

## 2016-12-25 NOTE — Patient Instructions (Addendum)
Steven Shepard ,  Schedule an appt to have you vision checked.   Bring a copy of your living will and/or healthcare power of attorney to your next office visit.  Thank you for taking time to come for your Medicare Wellness Visit. I appreciate your ongoing commitment to your health goals. Please review the following plan we discussed and let me know if I can assist you in the future.   This is a list of the screening recommended for you and due dates:  Health Maintenance  Topic Date Due  .  Hepatitis C: One time screening is recommended by Center for Disease Control  (CDC) for  adults born from [birth year] through 1964.   07/18/1945  . Eye exam for diabetics  02/08/1957  . Tetanus Vaccine  12/25/2017*  . Pneumonia vaccines (2 of 2 - PPSV23) 12/25/2017*  . Urine Protein Check  05/16/2017  . Hemoglobin A1C  05/29/2017  . Complete foot exam   08/29/2017  . Colon Cancer Screening  03/12/2021  . Flu Shot  Completed  *Topic was postponed. The date shown is not the original due date.   Preventive Care for Adults  A healthy lifestyle and preventive care can promote health and wellness. Preventive health guidelines for adults include the following key practices.  . A routine yearly physical is a good way to check with your health care provider about your health and preventive screening. It is a chance to share any concerns and updates on your health and to receive a thorough exam.  . Visit your dentist for a routine exam and preventive care every 6 months. Brush your teeth twice a day and floss once a day. Good oral hygiene prevents tooth decay and gum disease.  . The frequency of eye exams is based on your age, health, family medical history, use  of contact lenses, and other factors. Follow your health care provider's ecommendations for frequency of eye exams.  . Eat a healthy diet. Foods like vegetables, fruits, whole grains, low-fat dairy products, and lean protein foods contain the nutrients  you need without too many calories. Decrease your intake of foods high in solid fats, added sugars, and salt. Eat the right amount of calories for you. Get information about a proper diet from your health care provider, if necessary.  . Regular physical exercise is one of the most important things you can do for your health. Most adults should get at least 150 minutes of moderate-intensity exercise (any activity that increases your heart rate and causes you to sweat) each week. In addition, most adults need muscle-strengthening exercises on 2 or more days a week.  Silver Sneakers may be a benefit available to you. To determine eligibility, you may visit the website: www.silversneakers.com or contact program at 636-083-3865 Mon-Fri between 8AM-8PM.   . Maintain a healthy weight. The body mass index (BMI) is a screening tool to identify possible weight problems. It provides an estimate of body fat based on height and weight. Your health care provider can find your BMI and can help you achieve or maintain a healthy weight.   For adults 20 years and older: ? A BMI below 18.5 is considered underweight. ? A BMI of 18.5 to 24.9 is normal. ? A BMI of 25 to 29.9 is considered overweight. ? A BMI of 30 and above is considered obese.   . Maintain normal blood lipids and cholesterol levels by exercising and minimizing your intake of saturated fat. Eat a balanced diet with  plenty of fruit and vegetables. Blood tests for lipids and cholesterol should begin at age 14 and be repeated every 5 years. If your lipid or cholesterol levels are high, you are over 50, or you are at high risk for heart disease, you may need your cholesterol levels checked more frequently. Ongoing high lipid and cholesterol levels should be treated with medicines if diet and exercise are not working.  . If you smoke, find out from your health care provider how to quit. If you do not use tobacco, please do not start.  . If you choose to  drink alcohol, please do not consume more than 2 drinks per day. One drink is considered to be 12 ounces (355 mL) of beer, 5 ounces (148 mL) of wine, or 1.5 ounces (44 mL) of liquor.  . If you are 24-76 years old, ask your health care provider if you should take aspirin to prevent strokes.  . Use sunscreen. Apply sunscreen liberally and repeatedly throughout the day. You should seek shade when your shadow is shorter than you. Protect yourself by wearing long sleeves, pants, a wide-brimmed hat, and sunglasses year round, whenever you are outdoors.  . Once a month, do a whole body skin exam, using a mirror to look at the skin on your back. Tell your health care provider of new moles, moles that have irregular borders, moles that are larger than a pencil eraser, or moles that have changed in shape or color.

## 2016-12-26 NOTE — Progress Notes (Signed)
I have personally reviewed the Medicare Annual Wellness questionnaire and have noted 1. The patient's medical and social history 2. Their use of alcohol, tobacco or illicit drugs 3. Their current medications and supplements 4. The patient's functional ability including ADL's, fall risks, home safety risks and hearing or visual impairment. 5. Diet and physical activities 6. Evidence for depression or mood disorders 7. Reviewed Updated provider list, see scanned forms and [facility] Snapshot.   The patients weight, height, BMI and visual acuity have been recorded in the chart I have made referrals, counseling and provided education to the patient based review of the above and I have provided the pt with a written personalized care plan for preventive services.  I have provided the patient with a copy of your personalized plan for preventive services. Instructed to take the time to review along with their updated medication list.  Steven Shepard  

## 2016-12-27 ENCOUNTER — Ambulatory Visit: Payer: Medicare Other

## 2017-01-02 ENCOUNTER — Encounter: Payer: Self-pay | Admitting: Family Medicine

## 2017-01-02 ENCOUNTER — Other Ambulatory Visit: Payer: Self-pay

## 2017-01-02 MED ORDER — METFORMIN HCL 1000 MG PO TABS: 1000.0000 mg | ORAL_TABLET | Freq: Two times a day (BID) | ORAL | 3 refills | Status: DC

## 2017-01-07 ENCOUNTER — Ambulatory Visit (INDEPENDENT_AMBULATORY_CARE_PROVIDER_SITE_OTHER): Payer: Medicare Other | Admitting: General Practice

## 2017-01-07 DIAGNOSIS — Z7901 Long term (current) use of anticoagulants: Secondary | ICD-10-CM

## 2017-01-07 LAB — POCT INR: INR: 2.4

## 2017-01-07 NOTE — Patient Instructions (Signed)
Pre visit review using our clinic review tool, if applicable. No additional management support is needed unless otherwise documented below in the visit note. 

## 2017-01-07 NOTE — Progress Notes (Signed)
I agree with this plan.

## 2017-01-08 ENCOUNTER — Ambulatory Visit: Payer: Medicare Other

## 2017-01-16 ENCOUNTER — Other Ambulatory Visit: Payer: Self-pay | Admitting: Cardiology

## 2017-01-16 ENCOUNTER — Encounter: Payer: Self-pay | Admitting: Family Medicine

## 2017-01-16 ENCOUNTER — Other Ambulatory Visit: Payer: Self-pay | Admitting: Surgical

## 2017-01-16 ENCOUNTER — Other Ambulatory Visit: Payer: Self-pay | Admitting: Family Medicine

## 2017-01-16 DIAGNOSIS — D2372 Other benign neoplasm of skin of left lower limb, including hip: Secondary | ICD-10-CM | POA: Diagnosis not present

## 2017-01-16 DIAGNOSIS — C4442 Squamous cell carcinoma of skin of scalp and neck: Secondary | ICD-10-CM | POA: Diagnosis not present

## 2017-01-16 DIAGNOSIS — Z85828 Personal history of other malignant neoplasm of skin: Secondary | ICD-10-CM | POA: Diagnosis not present

## 2017-01-16 DIAGNOSIS — Z85820 Personal history of malignant melanoma of skin: Secondary | ICD-10-CM | POA: Diagnosis not present

## 2017-01-16 DIAGNOSIS — L570 Actinic keratosis: Secondary | ICD-10-CM | POA: Diagnosis not present

## 2017-01-16 DIAGNOSIS — L821 Other seborrheic keratosis: Secondary | ICD-10-CM | POA: Diagnosis not present

## 2017-01-16 DIAGNOSIS — D225 Melanocytic nevi of trunk: Secondary | ICD-10-CM | POA: Diagnosis not present

## 2017-01-16 DIAGNOSIS — D2261 Melanocytic nevi of right upper limb, including shoulder: Secondary | ICD-10-CM | POA: Diagnosis not present

## 2017-01-16 DIAGNOSIS — L814 Other melanin hyperpigmentation: Secondary | ICD-10-CM | POA: Diagnosis not present

## 2017-01-16 DIAGNOSIS — L820 Inflamed seborrheic keratosis: Secondary | ICD-10-CM | POA: Diagnosis not present

## 2017-01-16 MED ORDER — WARFARIN SODIUM 5 MG PO TABS: ORAL_TABLET | ORAL | 3 refills | Status: DC

## 2017-01-16 MED ORDER — TAMSULOSIN HCL 0.4 MG PO CAPS: 0.4000 mg | ORAL_CAPSULE | Freq: Every day | ORAL | 1 refills | Status: DC

## 2017-01-16 MED ORDER — ATORVASTATIN CALCIUM 20 MG PO TABS: 20.0000 mg | ORAL_TABLET | Freq: Every day | ORAL | 2 refills | Status: DC

## 2017-01-26 ENCOUNTER — Other Ambulatory Visit: Payer: Self-pay | Admitting: Cardiology

## 2017-01-26 DIAGNOSIS — I5031 Acute diastolic (congestive) heart failure: Secondary | ICD-10-CM

## 2017-01-26 DIAGNOSIS — I480 Paroxysmal atrial fibrillation: Secondary | ICD-10-CM

## 2017-01-28 DIAGNOSIS — L988 Other specified disorders of the skin and subcutaneous tissue: Secondary | ICD-10-CM | POA: Diagnosis not present

## 2017-01-28 DIAGNOSIS — Z85828 Personal history of other malignant neoplasm of skin: Secondary | ICD-10-CM | POA: Diagnosis not present

## 2017-01-28 DIAGNOSIS — C4442 Squamous cell carcinoma of skin of scalp and neck: Secondary | ICD-10-CM | POA: Diagnosis not present

## 2017-02-06 ENCOUNTER — Ambulatory Visit (INDEPENDENT_AMBULATORY_CARE_PROVIDER_SITE_OTHER): Payer: Medicare Other | Admitting: General Practice

## 2017-02-06 DIAGNOSIS — Z7901 Long term (current) use of anticoagulants: Secondary | ICD-10-CM

## 2017-02-06 LAB — POCT INR: INR: 2.5

## 2017-02-06 NOTE — Progress Notes (Signed)
I agree with this plan.

## 2017-02-06 NOTE — Patient Instructions (Addendum)
Pre visit review using our clinic review tool, if applicable. No additional management support is needed unless otherwise documented below in the visit note.  Continue taking 1/2 tablet (0.5 mg) daily except 1 tablet (5 mg) on Tuesday and Friday. Recheck in 4 weeks.

## 2017-02-28 ENCOUNTER — Encounter: Payer: Self-pay | Admitting: Family Medicine

## 2017-02-28 ENCOUNTER — Other Ambulatory Visit: Payer: Self-pay

## 2017-02-28 ENCOUNTER — Ambulatory Visit (INDEPENDENT_AMBULATORY_CARE_PROVIDER_SITE_OTHER): Payer: Medicare Other | Admitting: Family Medicine

## 2017-02-28 VITALS — BP 122/64 | HR 78 | Temp 98.4°F | Wt 246.6 lb

## 2017-02-28 DIAGNOSIS — E782 Mixed hyperlipidemia: Secondary | ICD-10-CM

## 2017-02-28 DIAGNOSIS — R7303 Prediabetes: Secondary | ICD-10-CM

## 2017-02-28 DIAGNOSIS — K7581 Nonalcoholic steatohepatitis (NASH): Secondary | ICD-10-CM

## 2017-02-28 DIAGNOSIS — E669 Obesity, unspecified: Secondary | ICD-10-CM | POA: Diagnosis not present

## 2017-02-28 DIAGNOSIS — I10 Essential (primary) hypertension: Secondary | ICD-10-CM | POA: Diagnosis not present

## 2017-02-28 DIAGNOSIS — D891 Cryoglobulinemia: Secondary | ICD-10-CM

## 2017-02-28 DIAGNOSIS — Z7901 Long term (current) use of anticoagulants: Secondary | ICD-10-CM | POA: Diagnosis not present

## 2017-02-28 MED ORDER — METFORMIN HCL 1000 MG PO TABS: 1000.0000 mg | ORAL_TABLET | Freq: Two times a day (BID) | ORAL | 3 refills | Status: DC

## 2017-03-01 ENCOUNTER — Other Ambulatory Visit (INDEPENDENT_AMBULATORY_CARE_PROVIDER_SITE_OTHER): Payer: Medicare Other

## 2017-03-01 ENCOUNTER — Other Ambulatory Visit: Payer: Medicare Other

## 2017-03-01 DIAGNOSIS — R7303 Prediabetes: Secondary | ICD-10-CM | POA: Diagnosis not present

## 2017-03-01 DIAGNOSIS — Z1159 Encounter for screening for other viral diseases: Secondary | ICD-10-CM | POA: Diagnosis not present

## 2017-03-01 DIAGNOSIS — I10 Essential (primary) hypertension: Secondary | ICD-10-CM

## 2017-03-01 LAB — COMPREHENSIVE METABOLIC PANEL
ALT: 35 U/L (ref 0–53)
AST: 19 U/L (ref 0–37)
Albumin: 4.1 g/dL (ref 3.5–5.2)
Alkaline Phosphatase: 74 U/L (ref 39–117)
BUN: 19 mg/dL (ref 6–23)
CO2: 30 mEq/L (ref 19–32)
Calcium: 8.9 mg/dL (ref 8.4–10.5)
Chloride: 102 mEq/L (ref 96–112)
Creatinine, Ser: 0.96 mg/dL (ref 0.40–1.50)
GFR: 82.29 mL/min (ref >60.00)
Glucose, Bld: 172 mg/dL — ABNORMAL HIGH (ref 70–99)
Potassium: 3.9 mEq/L (ref 3.5–5.1)
Sodium: 138 mEq/L (ref 135–145)
Total Bilirubin: 1.8 mg/dL — ABNORMAL HIGH (ref 0.2–1.2)
Total Protein: 6.3 g/dL (ref 6.0–8.3)

## 2017-03-01 LAB — CBC WITH DIFFERENTIAL/PLATELET
Basophils Absolute: 37 cells/uL (ref 0–200)
Basophils Relative: 0.4 %
Eosinophils Absolute: 138 cells/uL (ref 15–500)
Eosinophils Relative: 1.5 %
HCT: 53.7 % — ABNORMAL HIGH (ref 38.5–50.0)
Hemoglobin: 17.3 g/dL — ABNORMAL HIGH (ref 13.2–17.1)
Lymphs Abs: 2898 cells/uL (ref 850–3900)
MCH: 28.6 pg (ref 27.0–33.0)
MCHC: 32.2 g/dL (ref 32.0–36.0)
MCV: 88.8 fL (ref 80.0–100.0)
MPV: 10.6 fL (ref 7.5–12.5)
Monocytes Relative: 5.9 %
Neutro Abs: 5584 cells/uL (ref 1500–7800)
Neutrophils Relative %: 60.7 %
Platelets: 162 10*3/uL (ref 140–400)
RBC: 6.05 10*6/uL — ABNORMAL HIGH (ref 4.20–5.80)
RDW: 15.2 % — ABNORMAL HIGH (ref 11.0–15.0)
Total Lymphocyte: 31.5 %
WBC mixed population: 543 cells/uL (ref 200–950)
WBC: 9.2 10*3/uL (ref 3.8–10.8)

## 2017-03-01 LAB — HEMOGLOBIN A1C: Hgb A1c MFr Bld: 5.7 % (ref 4.6–6.5)

## 2017-03-02 LAB — HEPATITIS C ANTIBODY
Hepatitis C Ab: NONREACTIVE
SIGNAL TO CUT-OFF: 0.02 (ref <1.00)

## 2017-03-03 ENCOUNTER — Encounter: Payer: Self-pay | Admitting: Family Medicine

## 2017-03-03 NOTE — Progress Notes (Signed)
Steven Shepard is a 71 y.o. male is here for follow up.  History of Present Illness:   HPI: See Assessment and Plan section for Problem Based Charting of issues discussed today.  The 10-year ASCVD risk score Steven Shepard., et al., 2013) is: 35.9%   Values used to calculate the score:     Age: 42 years     Sex: Male     Is Non-Hispanic African American: No     Diabetic: Yes     Tobacco smoker: No     Systolic Blood Pressure: 254 mmHg     Is BP treated: Yes     HDL Cholesterol: 33.4 mg/dL     Total Cholesterol: 160 mg/dL  Depression screen [facility] 2/9 12/25/2016 04/26/2016 04/26/2016  Decreased Interest 0 0 0  Down, Depressed, Hopeless 0 0 0  PHQ - 2 Score 0 0 0   PMHx, SurgHx, SocialHx, FamHx, Medications, and Allergies were reviewed in the Visit Navigator and updated as appropriate.   Patient Active Problem List   Diagnosis Date Noted  . Gallstone 04/26/2016  . NASH (nonalcoholic steatohepatitis) 04/26/2016  . Obesity (BMI 30.0-34.9) 04/26/2016  . Osteoarthrosis involving lower leg 05/21/2015  . Acute diastolic heart failure ([place]) 02/16/2015  . Benign non-nodular prostatic hyperplasia 01/11/2014  . Lower extremity edema 08/24/2013  . Cryoglobulinemia ([place]) 01/09/2013  . Systolic murmur of aorta 27/08/2374  . Elevated prostate specific antigen (PSA) 01/10/2012  . 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 Anemia, hemolytic ([place]) 01/22/2011  . Edema 12/26/2010  . Long term (current) use of anticoagulants 06/20/2010  . PAF (paroxysmal atrial fibrillation) ([place]) 06/09/2010  . Atherosclerosis of native arteries of extremity with rest pain ([place]) 02/14/2010  . Spinal stenosis of lumbar region with neurogenic claudication 02/14/2010  . Thoracic or lumbosacral neuritis or radiculitis, unspecified 01/10/2010  . Chronic obstructive pulmonary disease ([place]) 08/10/2009  . Obstructive sleep apnea syndrome 03/02/2008  . Hyperlipidemia 03/01/2008  . Gastroesophageal reflux disease  with esophagitis 11/27/2007   Social History   Tobacco Use  . Smoking status: Former Smoker    Packs/day: 1.50    Years: 3.00    Pack years: 4.50    Types: Cigarettes, Pipe    Last attempt to quit: 03/12/1973    Years since quitting: 44.0  . Smokeless tobacco: Never Used  . Tobacco comment: quit appox 36 years ago  Substance Use Topics  . Alcohol use: Yes    Comment: occasionally, 1-7 drinks per week  . Drug use: No   Current Medications and Allergies:   .  acetaminophen (TYLENOL) 650 MG CR tablet, Take 650 mg by mouth 2 (two) times daily as needed for pain. , Disp: , Rfl:  .  aspirin 81 MG tablet, Take 81 mg by mouth daily.  , Disp: , Rfl:  .  atorvastatin (LIPITOR) 20 MG tablet, Take 1 tablet (20 mg total) daily by mouth., Disp: 90 tablet, Rfl: 2 .  CARTIA XT 180 MG 24 hr capsule, TAKE 2 CAPSULES BY MOUTH EVERY DAY, Disp: 180 capsule, Rfl: 1 .  cloNIDine (CATAPRES) 0.3 MG tablet, TAKE 1 TABLET BY MOUTH TWICE DAILY, Disp: 180 tablet, Rfl: 1 .  Fexofenadine HCl (ALLEGRA PO), Take 1 tablet by mouth every morning. , Disp: , Rfl:  .  folic acid (FOLVITE) 283 MCG tablet, Take 400 mcg by mouth 2 (two) times daily., Disp: , Rfl:  .  furosemide (LASIX) 40 MG tablet, TAKE 1 TABLET BY MOUTH AT 8 AM AND  1 TABLET AT 2 PM, Disp: 180 tablet, Rfl: 1 .  metFORMIN (GLUCOPHAGE) 1000 MG tablet, Take 1 tablet (1,000 mg total) by mouth 2 (two) times daily with a meal., Disp: 180 tablet, Rfl: 3 .  metoprolol succinate (TOPROL-XL) 50 MG 24 hr tablet, Take 1 tablet (50 mg total) by mouth 2 (two) times daily., Disp: 180 tablet, Rfl: 3 .  Multiple Vitamin (MULTIVITAMIN) capsule, Take 1 capsule by mouth daily.  , Disp: , Rfl:  .  Multiple Vitamins-Minerals (EMERGEN-C IMMUNE PO), Take by mouth daily., Disp: , Rfl:  .  Omega-3 Fatty Acids (FISH OIL) 1000 MG CAPS, Take 1 capsule by mouth daily. , Disp: , Rfl:  .  potassium chloride SA (K-DUR,KLOR-CON) 20 MEQ tablet, Take 1 tablet (20 mEq total) by mouth 2 (two)  times daily., Disp: 180 tablet, Rfl: 1 .  Psyllium (METAMUCIL FIBER PO), Take by mouth daily., Disp: , Rfl:  .  ranitidine (ZANTAC) 75 MG tablet, Take 75 mg by mouth as needed for heartburn. , Disp: , Rfl:  .  tamsulosin (FLOMAX) 0.4 MG CAPS capsule, TAKE ONE CAPSULE BY MOUTH DAILY, Disp: 90 capsule, Rfl: 1 .  warfarin (COUMADIN) 5 MG tablet, TAKE AS DIRECTED BY COUMADIN CLINIC, Disp: 30 tablet, Rfl: 3   Allergies  Allergen Reactions  . Ace Inhibitors Other (See Comments)    Other reaction(s): Other (See Comments) High K+  . Codeine     Other reaction(s): Other (See Comments) Hallucinations  . Spironolactone     Increases potassium   Review of Systems   Pertinent items are noted in the HPI. Otherwise, ROS is negative.  Vitals:   Vitals:   02/28/17 0844  BP: 122/64  Pulse: 78  Temp: 98.4 F (36.9 C)  TempSrc: Oral  SpO2: 96%  Weight: 246 lb 9.6 oz (111.9 kg)     Body mass index is 33.44 kg/m.   Physical Exam:   Physical Exam  Constitutional: He is oriented to person, place, and time. He appears well-developed and well-nourished. No distress.  HENT:  Head: Normocephalic and atraumatic.  Right Ear: External ear normal.  Left Ear: External ear normal.  Nose: Nose normal.  Mouth/Throat: Oropharynx is clear and moist.  Eyes: Conjunctivae and EOM are normal. Pupils are equal, round, and reactive to light.  Neck: Normal range of motion. Neck supple.  Cardiovascular: Normal rate, regular rhythm, normal heart sounds and intact distal pulses.  Pulmonary/Chest: Effort normal and breath sounds normal.  Abdominal: Soft. Bowel sounds are normal.  Musculoskeletal: Normal range of motion.  Neurological: He is alert and oriented to person, place, and time.  Skin: Skin is warm and dry.  Psychiatric: He has a normal mood and affect. His behavior is normal. Judgment and thought content normal.  Nursing note and vitals reviewed.  Results for orders placed or performed in visit on  02/06/17  POCT INR  Result Value Ref Range   INR 2.5     Assessment and Plan:   1. Prediabetes  Current symptoms: no polyuria or polydipsia, no chest pain, dyspnea or TIA's, no numbness, tingling or pain in extremities.  Taking medication compliantly without noted sided effects [x]   YES  []   NO  Maintaining a diabetic diet? []   YES  [x]   NO Trying to exercise on a regular basis? []   YES  [x]   NO  On ACE inhibitor or angiotensin II receptor blocker? []   YES  [x]   NO On Aspirin? []   YES  [x]   NO  Lab Results  Component Value Date   HGBA1C 5.7 03/01/2017    Lab Results  Component Value Date   MICROALBUR 1.7 05/16/2016    Lab Results  Component Value Date   CHOL 160 05/16/2016   HDL 33.40 (L) 05/16/2016   LDLCALC 88 05/16/2016   TRIG 196.0 (H) 05/16/2016   CHOLHDL 5 05/16/2016     Wt Readings from Last 3 Encounters:  02/28/17 246 lb 9.6 oz (111.9 kg)  12/25/16 240 lb 6.4 oz (109 kg)  11/29/16 249 lb 9.6 oz (113.2 kg)   BP Readings from Last 3 Encounters:  02/28/17 122/64  12/25/16 120/70  11/29/16 114/60   Lab Results  Component Value Date   CREATININE 0.96 03/01/2017   Orders: - metFORMIN (GLUCOPHAGE) 1000 MG tablet; Take 1 tablet (1,000 mg total) by mouth 2 (two) times daily with a meal.  Dispense: 180 tablet; Refill: 3 - Comprehensive metabolic panel; Future - Hemoglobin A1c; Future  2. Benign essential hypertension  Avoiding excessive salt intake? []   YES  []   NO Trying to exercise on a regular basis? []   YES  []   NO Review: taking medications as instructed, no medication side effects noted, no TIAs, no chest pain on exertion, no dyspnea on exertion, no swelling of ankles.  Smoker: No.  Wt Readings from Last 3 Encounters:  02/28/17 246 lb 9.6 oz (111.9 kg)  12/25/16 240 lb 6.4 oz (109 kg)  11/29/16 249 lb 9.6 oz (113.2 kg)   BP Readings from Last 3 Encounters:  02/28/17 122/64  12/25/16 120/70  11/29/16 114/60   Lab Results  Component Value Date     CREATININE 0.96 03/01/2017   Orders: - CBC with Differential/Platelet; Future  3. NASH (nonalcoholic steatohepatitis)  US ABDOMEN ON 11/07/2016  Increased hepatic echotexture compatible with fatty infiltration. No definite cirrhotic changes. Solitary gallstone without sonographic evidence of acute cholecystitis. Cystic structure adjacent to the right lobe of the liver or in a subcapsular location within the liver. Incidental note is made of a large cyst in the anterior aspect of the right kidney. This has been previously demonstrated.  4. Obesity (BMI 30.0-34.9) The patient is asked to make an attempt to improve diet and exercise patterns to aid in medical management of this problem.   5. Cryoglobulinemia ([place])  CBC Latest Ref Rng & Units 03/01/2017 02/16/2015 12/25/2013  WBC 3.8 - 10.8 Thousand/uL 9.2 6.9 6.4  Hemoglobin 13.2 - 17.1 g/dL 17.3(H) 13.7 13.3  Hematocrit 38.5 - 50.0 % 53.7(H) 38.6(L) Unable to Determine  Platelets 140 - 400 Thousand/uL 162 246 190   6. Mixed hyperlipidemia Is the patient taking medications without problems? [x]   YES  []   NO Does the patient complain of muscle aches?   []   YES  [x]    NO Trying to exercise on a regular basis? []   YES  [x]   NO Diet Compliance: noncompliant much of the time. Cardiovascular ROS: no chest pain or dyspnea on exertion.   Lipids:    Component Value Date/Time   CHOL 160 05/16/2016 0806   TRIG 196.0 (H) 05/16/2016 0806   HDL 33.40 (L) 05/16/2016 0806   VLDL 39.2 05/16/2016 0806   CHOLHDL 5 05/16/2016 0806    7. Long term (current) use of anticoagulants On Coumadin. Followed by Cardiology Clinic. Interested in transition to [facility] for ease.  Lab Results  Component Value Date   INR 2.5 02/06/2017   INR 2.4 01/07/2017   INR 1.7 12/25/2016    .  Reviewed expectations re: course of current medical issues. . Discussed self-management of symptoms. . Outlined signs and symptoms indicating need for more acute  intervention. . Patient verbalized understanding and all questions were answered. Marland Kitchen Health Maintenance issues including appropriate healthy diet, exercise, and smoking avoidance were discussed with patient. . See orders for this visit as documented in the electronic medical record. . Patient received an After Visit Summary.  Briscoe Deutscher, DO [place] [place] 03/03/2017  Future Appointments  Date Time Provider [place]  03/06/2017  1:00 PM [facility]  03/22/2017  8:20 AM Dorothy Spark, MD [facility]  12/26/2017  9:00 AM Williemae Area, RN [facility]  12/26/2017 10:00 AM [facility] LAB [facility]  01/02/2018  9:00 AM Briscoe Deutscher, DO [facility]

## 2017-03-03 NOTE — Assessment & Plan Note (Deleted)
[id number];kjdanff

## 2017-03-03 NOTE — Assessment & Plan Note (Deleted)
  Component Value Date/Time   CHOL 160 05/16/2016 0806   TRIG 196.0 (H) 05/16/2016 0806   HDL 33.40 (L) 05/16/2016 0806   VLDL 39.2 05/16/2016 0806   CHOLHDL 5 05/16/2016 5996

## 2017-03-04 ENCOUNTER — Other Ambulatory Visit: Payer: Self-pay | Admitting: Surgical

## 2017-03-04 ENCOUNTER — Encounter: Payer: Self-pay | Admitting: Family Medicine

## 2017-03-04 DIAGNOSIS — R7989 Other specified abnormal findings of blood chemistry: Secondary | ICD-10-CM

## 2017-03-06 ENCOUNTER — Ambulatory Visit (INDEPENDENT_AMBULATORY_CARE_PROVIDER_SITE_OTHER): Payer: Medicare Other | Admitting: General Practice

## 2017-03-06 DIAGNOSIS — R0609 Other forms of dyspnea: Secondary | ICD-10-CM | POA: Insufficient documentation

## 2017-03-06 DIAGNOSIS — Z9989 Dependence on other enabling machines and devices: Secondary | ICD-10-CM | POA: Insufficient documentation

## 2017-03-06 DIAGNOSIS — Z7901 Long term (current) use of anticoagulants: Secondary | ICD-10-CM

## 2017-03-06 DIAGNOSIS — J449 Chronic obstructive pulmonary disease, unspecified: Secondary | ICD-10-CM | POA: Insufficient documentation

## 2017-03-06 DIAGNOSIS — I4891 Unspecified atrial fibrillation: Secondary | ICD-10-CM | POA: Insufficient documentation

## 2017-03-06 DIAGNOSIS — Z9889 Other specified postprocedural states: Secondary | ICD-10-CM | POA: Insufficient documentation

## 2017-03-06 DIAGNOSIS — K429 Umbilical hernia without obstruction or gangrene: Secondary | ICD-10-CM | POA: Insufficient documentation

## 2017-03-06 DIAGNOSIS — I10 Essential (primary) hypertension: Secondary | ICD-10-CM | POA: Insufficient documentation

## 2017-03-06 DIAGNOSIS — R0600 Dyspnea, unspecified: Secondary | ICD-10-CM | POA: Insufficient documentation

## 2017-03-06 DIAGNOSIS — G4733 Obstructive sleep apnea (adult) (pediatric): Secondary | ICD-10-CM | POA: Insufficient documentation

## 2017-03-06 LAB — POCT INR: INR: 2.6

## 2017-03-06 NOTE — Patient Instructions (Signed)
Pre visit review using our clinic review tool, if applicable. No additional management support is needed unless otherwise documented below in the visit note. 

## 2017-03-06 NOTE — Progress Notes (Signed)
I have reviewed and agree with this plan  

## 2017-03-07 ENCOUNTER — Other Ambulatory Visit: Payer: Self-pay | Admitting: Family Medicine

## 2017-03-12 ENCOUNTER — Other Ambulatory Visit: Payer: Self-pay | Admitting: Cardiology

## 2017-03-12 DIAGNOSIS — I110 Hypertensive heart disease with heart failure: Secondary | ICD-10-CM

## 2017-03-12 DIAGNOSIS — E7849 Other hyperlipidemia: Secondary | ICD-10-CM

## 2017-03-15 ENCOUNTER — Other Ambulatory Visit: Payer: Medicare Other

## 2017-03-15 ENCOUNTER — Other Ambulatory Visit (INDEPENDENT_AMBULATORY_CARE_PROVIDER_SITE_OTHER): Payer: Medicare Other

## 2017-03-15 DIAGNOSIS — R7989 Other specified abnormal findings of blood chemistry: Secondary | ICD-10-CM

## 2017-03-15 LAB — CBC

## 2017-03-20 ENCOUNTER — Other Ambulatory Visit (INDEPENDENT_AMBULATORY_CARE_PROVIDER_SITE_OTHER): Payer: Medicare Other

## 2017-03-20 ENCOUNTER — Other Ambulatory Visit: Payer: Self-pay | Admitting: Surgical

## 2017-03-20 DIAGNOSIS — I10 Essential (primary) hypertension: Secondary | ICD-10-CM

## 2017-03-20 DIAGNOSIS — D591 Other autoimmune hemolytic anemias: Principal | ICD-10-CM

## 2017-03-20 DIAGNOSIS — D5912 Cold autoimmune hemolytic anemia: Secondary | ICD-10-CM

## 2017-03-21 LAB — CBC WITH DIFFERENTIAL/PLATELET
Hemoglobin: 12.2 g/dL — ABNORMAL LOW (ref 13.0–17.0)
Platelets: 234 10*3/uL (ref 150.0–400.0)
WBC: 7.3 10*3/uL (ref 4.0–10.5)

## 2017-03-22 ENCOUNTER — Ambulatory Visit (INDEPENDENT_AMBULATORY_CARE_PROVIDER_SITE_OTHER): Payer: Medicare Other | Admitting: Cardiology

## 2017-03-22 ENCOUNTER — Encounter: Payer: Self-pay | Admitting: Cardiology

## 2017-03-22 VITALS — BP 138/70 | HR 58 | Ht 73.0 in | Wt 246.6 lb

## 2017-03-22 DIAGNOSIS — I110 Hypertensive heart disease with heart failure: Secondary | ICD-10-CM | POA: Diagnosis not present

## 2017-03-22 DIAGNOSIS — E782 Mixed hyperlipidemia: Secondary | ICD-10-CM | POA: Diagnosis not present

## 2017-03-22 DIAGNOSIS — I480 Paroxysmal atrial fibrillation: Secondary | ICD-10-CM

## 2017-03-22 DIAGNOSIS — I5032 Chronic diastolic (congestive) heart failure: Secondary | ICD-10-CM

## 2017-03-22 NOTE — Progress Notes (Signed)
Cardiology Office Note:    Date:  03/22/2017   ID:  Steven Shepard, DOB [birth year], MRN [id number]  PCP:  Briscoe Deutscher, DO  Cardiologist:  No primary care provider on file.   Referring MD: Briscoe Deutscher, DO   Reason for visit: 8 months follow up  History of Present Illness:    Steven Shepard is a 72 y.o. male with a hx of paroxysmal A. fib and difficult to control hypertension. He also is hyperlipidemia which is being followed by Dr.Spear. He states that he has palpitations especially after few glasses of wine but not more than once every couple of months. He does check his blood pressure at home and is normal. He is working out in the gym 2 days a week. Weight has been stable but he has not lost any. He's very compliant with his medications.  07/24/2016 - 6 months follow-up, the patient is doing great and denies any chest pain or shortness of breath, he walks daily with his dog and exercises with a personal trainer twice a week. He continues to have alcoholic beverages 3-4 times a week and gets palpitations that last about 15-20 minutes when he hasn't drink. Those are not associated with dizziness presyncope or syncope. He has been compliant with warfarin. He started Mediterranean-style diet however doesn't follow with 100% and still eats a lot ofmeat and carbs.  03/22/2017 - 8 months follow up, the patient states that he has been feeling well, he continues to walk and denies any chest pain or shortness of breath, he has stable mild lower extremity edema, he uses compression stockings most days, he can tolerate this, denies any orthopnea. His medications denies bleeding with warfarin and no muscle pain with atorvastatin.   Past Medical History:  Diagnosis Date  . Anemia, hemolytic ([place]) 01/22/2011  . Atrial fibrillation ([place])   . 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 COPD (chronic obstructive pulmonary disease) ([place])   . DOE (dyspnea on exertion)   . History of neck  surgery   . Hyperlipidemia   . Hypertension   . OSA on CPAP   . Umbilical hernia     Past Surgical History:  Procedure Laterality Date  . ADENOIDECTOMY    . HIATAL HERNIA REPAIR  2011  . NECK SURGERY  2005  . SKIN SURGERY     skin cancer  . TONSILLECTOMY    . TOTAL HIP ARTHROPLASTY Right 09/18/2010   Dr. Maureen Ralphs    Current Medications: Current Meds  Medication Sig  . acetaminophen (TYLENOL) 650 MG CR tablet Take 650 mg by mouth 2 (two) times daily as needed for pain.   Marland Kitchen aspirin 81 MG tablet Take 81 mg by mouth daily.    Marland Kitchen atorvastatin (LIPITOR) 20 MG tablet Take 1 tablet (20 mg total) daily by mouth.  . CARTIA XT 180 MG 24 hr capsule TAKE 2 CAPSULES BY MOUTH EVERY DAY  . cloNIDine (CATAPRES) 0.3 MG tablet TAKE 1 TABLET BY MOUTH TWICE DAILY  . Fexofenadine HCl (ALLEGRA PO) Take 1 tablet by mouth every morning.   . folic acid (FOLVITE) 035 MCG tablet Take 400 mcg by mouth 2 (two) times daily.  . furosemide (LASIX) 40 MG tablet TAKE 1 TABLET BY MOUTH AT 8 AM AND 1 TABLET AT 2 PM  . metFORMIN (GLUCOPHAGE) 1000 MG tablet Take 1 tablet (1,000 mg total) by mouth 2 (two) times daily with a meal.  . metoprolol succinate (TOPROL-XL) 50 MG 24 hr tablet Take 1 tablet (50  mg total) by mouth 2 (two) times daily.  . Multiple Vitamin (MULTIVITAMIN) capsule Take 1 capsule by mouth daily.    . Multiple Vitamins-Minerals (EMERGEN-C IMMUNE PO) Take 1 tablet by mouth daily.   . Omega-3 Fatty Acids (FISH OIL) 1000 MG CAPS Take 1 capsule by mouth daily.   . potassium chloride SA (K-DUR,KLOR-CON) 20 MEQ tablet Take 1 tablet (20 mEq total) by mouth 2 (two) times daily. Please make yearly appt with Dr. Meda Coffee for May. 1st attempt  . Psyllium (METAMUCIL FIBER PO) Take 1 Dose by mouth daily.   . ranitidine (ZANTAC) 75 MG tablet Take 75 mg by mouth daily as needed for heartburn.   . tamsulosin (FLOMAX) 0.4 MG CAPS capsule TAKE ONE CAPSULE BY MOUTH DAILY  . warfarin (COUMADIN) 5 MG tablet TAKE AS DIRECTED  BY COUMADIN CLINIC     Allergies:   Ace inhibitors; Codeine; and Spironolactone   Social History   Socioeconomic History  . Marital status: Married    Spouse name: None  . Number of children: None  . Years of education: None  . Highest education level: None  Social Needs  . Financial resource strain: None  . Food insecurity - worry: None  . Food insecurity - inability: None  . Transportation needs - medical: None  . Transportation needs - non-medical: None  Occupational History  . Occupation: Architectural technologist: [employer]  Tobacco Use  . Smoking status: Former Smoker    Packs/day: 1.50    Years: 3.00    Pack years: 4.50    Types: Cigarettes, Pipe    Last attempt to quit: 03/12/1973    Years since quitting: 44.0  . Smokeless tobacco: Never Used  . Tobacco comment: quit appox 36 years ago  Substance and Sexual Activity  . Alcohol use: Yes    Comment: occasionally, 1-7 drinks per week  . Drug use: No  . Sexual activity: Yes  Other Topics Concern  . None  Social History Narrative  . None     Family History: The patient's family history includes Cancer in his brother; Coronary artery disease in his father and mother; Diabetes in his brother and sister; Healthy in his sister; Heart attack in his father and mother; Hypertension in his father, mother, and sister; Liver disease in his brother. There is no history of Colon cancer or Colonic polyp.  ROS:   Please see the history of present illness.    All other systems reviewed and are negative.  EKGs/Labs/Other Studies Reviewed:    The following studies were reviewed today:  EKG:  EKG is ordered today.  The ekg ordered today demonstrates sinus bradycardia, 58 bpm, first-degree AV block otherwise normal and unchanged from prior. This was personally reviewed.  Recent Labs: 03/01/2017: ALT 35; BUN 19; Creatinine, Ser 0.96; Potassium 3.9; Sodium 138 03/20/2017: Hemoglobin 12.2; Platelets 234.0   Recent Lipid Panel      Component Value Date/Time   CHOL 160 05/16/2016 0806   TRIG 196.0 (H) 05/16/2016 0806   HDL 33.40 (L) 05/16/2016 0806   CHOLHDL 5 05/16/2016 0806   VLDL 39.2 05/16/2016 0806   LDLCALC 88 05/16/2016 0806   Physical Exam:    VS:  BP 138/70   Pulse (!) 58   Ht 6' 1"  (1.854 m)   Wt 246 lb 9.6 oz (111.9 kg)   BMI 32.53 kg/m     Wt Readings from Last 3 Encounters:  03/22/17 246 lb 9.6 oz (111.9 kg)  02/28/17 246 lb 9.6 oz (111.9 kg)  12/25/16 240 lb 6.4 oz (109 kg)    GEN:  Well nourished, well developed in no acute distress HEENT: Normal NECK: No JVD; No carotid bruits LYMPHATICS: No lymphadenopathy CARDIAC: RRR, 3/6 holosystolic murmur, S2 present, rubs, gallops RESPIRATORY:  Clear to auscultation without rales, wheezing or rhonchi  ABDOMEN: Soft, non-tender, non-distended MUSCULOSKELETAL:  No edema; No deformity  SKIN: Warm and dry NEUROLOGIC:  Alert and oriented x 3 PSYCHIATRIC:  Normal affect   Echocardiogram - 09/16/2013 Left ventricle: The cavity size was normal. Wall thickness was increased in a pattern of mild LVH. Systolic function was normal. The estimated ejection fraction was in the range of 55% to 60%. Wall motion was normal; there were no regional wall motion abnormalities. - Left atrium: The atrium was mildly dilated. - Atrial septum: No defect or patent foramen ovale was identified.  ECG - normal sinus rhythm, 66 bpm, normal EKG unchanged from prior.   ASSESSMENT:    1. PAF (paroxysmal atrial fibrillation) ([place])   2. Mixed hyperlipidemia   3. Hypertensive heart disease with heart failure ([place])   4. Chronic diastolic CHF (congestive heart failure) ([facility])    PLAN:    In order of problems listed above:  1. Acute on chronic diastolic CHF - use compression stockings. Continue lasix 40 mg po BID. stable. He previously didn't tolerate spironolactone as he developed hyperkalemia and legs cramping.  2. Hypertension - controlled.  3. Hyperlipidemia  - followed by his PCP Dr Juleen China, on atorvastatin 20 mg daily, and fish all, normal LFTs, elevated TG, advised about cutting down etoh and carbs.  4. Paroxysmal A. Fib - currently in sinus rhythm on chronic anticoagulation with warfarin. No bleeding, he gets episodes of a-fib when he has an extra drink. Advised to cut down. Compliant with warfarin.  5. Mild aortic stenosis - on echo in 12/2015, S2 still present, we'll repeat echocardiogram at the next visit.   Follow-up in 6 months.  Medication Adjustments/Labs and Tests Ordered: Current medicines are reviewed at length with the patient today.  Concerns regarding medicines are outlined above.  Orders Placed This Encounter  Procedures  . EKG 12-Lead   No orders of the defined types were placed in this encounter.   Signed, Ena Dawley, MD  03/22/2017 9:17 AM    [place]

## 2017-03-22 NOTE — Patient Instructions (Signed)

## 2017-03-27 ENCOUNTER — Encounter: Payer: Self-pay | Admitting: Family Medicine

## 2017-04-17 ENCOUNTER — Ambulatory Visit (INDEPENDENT_AMBULATORY_CARE_PROVIDER_SITE_OTHER): Payer: Medicare Other | Admitting: General Practice

## 2017-04-17 DIAGNOSIS — Z7901 Long term (current) use of anticoagulants: Secondary | ICD-10-CM | POA: Diagnosis not present

## 2017-04-17 LAB — POCT INR: INR: 2.2

## 2017-04-17 NOTE — Patient Instructions (Addendum)
Pre visit review using our clinic review tool, if applicable. No additional management support is needed unless otherwise documented below in the visit note.  Continue taking 1/2 tablet (0.5 mg) daily except 1 tablet (5 mg) on Tuesday and Friday. Recheck in 6 weeks.

## 2017-04-17 NOTE — Progress Notes (Signed)
I have reviewed and agree with this plan  

## 2017-04-22 DIAGNOSIS — D2372 Other benign neoplasm of skin of left lower limb, including hip: Secondary | ICD-10-CM | POA: Diagnosis not present

## 2017-04-22 DIAGNOSIS — L814 Other melanin hyperpigmentation: Secondary | ICD-10-CM | POA: Diagnosis not present

## 2017-04-22 DIAGNOSIS — L570 Actinic keratosis: Secondary | ICD-10-CM | POA: Diagnosis not present

## 2017-04-22 DIAGNOSIS — Z85828 Personal history of other malignant neoplasm of skin: Secondary | ICD-10-CM | POA: Diagnosis not present

## 2017-04-22 DIAGNOSIS — D225 Melanocytic nevi of trunk: Secondary | ICD-10-CM | POA: Diagnosis not present

## 2017-04-22 DIAGNOSIS — L820 Inflamed seborrheic keratosis: Secondary | ICD-10-CM | POA: Diagnosis not present

## 2017-05-13 ENCOUNTER — Ambulatory Visit (INDEPENDENT_AMBULATORY_CARE_PROVIDER_SITE_OTHER): Payer: Medicare Other | Admitting: Family Medicine

## 2017-05-13 ENCOUNTER — Encounter: Payer: Self-pay | Admitting: Family Medicine

## 2017-05-13 ENCOUNTER — Ambulatory Visit: Payer: Medicare Other | Admitting: Family Medicine

## 2017-05-13 VITALS — BP 138/78 | HR 63 | Temp 97.5°F | Ht 73.0 in | Wt 241.2 lb

## 2017-05-13 DIAGNOSIS — R69 Illness, unspecified: Secondary | ICD-10-CM

## 2017-05-13 DIAGNOSIS — J449 Chronic obstructive pulmonary disease, unspecified: Secondary | ICD-10-CM

## 2017-05-13 DIAGNOSIS — J111 Influenza due to unidentified influenza virus with other respiratory manifestations: Secondary | ICD-10-CM

## 2017-05-13 MED ORDER — OSELTAMIVIR PHOSPHATE 75 MG PO CAPS: 75.0000 mg | ORAL_CAPSULE | Freq: Two times a day (BID) | ORAL | 0 refills | Status: AC

## 2017-05-13 MED ORDER — BENZONATATE 200 MG PO CAPS
200.0000 mg | ORAL_CAPSULE | Freq: Two times a day (BID) | ORAL | 0 refills | Status: DC | PRN
Start: 2017-05-13 — End: 2018-01-01

## 2017-05-13 MED ORDER — DOXYCYCLINE HYCLATE 100 MG PO TABS: 100.0000 mg | ORAL_TABLET | Freq: Two times a day (BID) | ORAL | 0 refills | Status: DC

## 2017-05-13 NOTE — Progress Notes (Signed)
Steven Shepard is a 72 y.o. male here for an acute visit.  History of Present Illness:   Steven Shepard CMA acting as scribe for Dr. Juleen China.  HPI: Patient comes in today for acute issue. Patient started having cough and chest congestion this weekend. Denies fever or body aches.   PMHx, SurgHx, SocialHx, Medications, and Allergies were reviewed in the Visit Navigator and updated as appropriate.  Review of Systems  Constitutional: Negative for chills and fever.  HENT: Negative for ear pain.   Eyes: Negative for blurred vision and double vision.  Respiratory: Positive for cough.   Cardiovascular: Negative for chest pain and palpitations.  Gastrointestinal: Negative for nausea and vomiting.  Musculoskeletal: Negative for back pain and neck pain.  Neurological: Negative for tingling and headaches.   Current Medications:   .  acetaminophen (TYLENOL) 650 MG CR tablet, Take 650 mg by mouth 2 (two) times daily as needed for pain. , Disp: , Rfl:  .  aspirin 81 MG tablet, Take 81 mg by mouth daily.  , Disp: , Rfl:  .  atorvastatin (LIPITOR) 20 MG tablet, Take 1 tablet (20 mg total) daily by mouth., Disp: 90 tablet, Rfl: 2 .  CARTIA XT 180 MG 24 hr capsule, TAKE 2 CAPSULES BY MOUTH EVERY DAY, Disp: 180 capsule, Rfl: 1 .  cloNIDine (CATAPRES) 0.3 MG tablet, TAKE 1 TABLET BY MOUTH TWICE DAILY, Disp: 180 tablet, Rfl: 1 .  Fexofenadine HCl (ALLEGRA PO), Take 1 tablet by mouth every morning. , Disp: , Rfl:  .  folic acid (FOLVITE) 737 MCG tablet, Take 400 mcg by mouth 2 (two) times daily., Disp: , Rfl:  .  furosemide (LASIX) 40 MG tablet, TAKE 1 TABLET BY MOUTH AT 8 AM AND 1 TABLET AT 2 PM, Disp: 180 tablet, Rfl: 1 .  metFORMIN (GLUCOPHAGE) 1000 MG tablet, Take 1 tablet (1,000 mg total) by mouth 2 (two) times daily with a meal., Disp: 180 tablet, Rfl: 3 .  metoprolol succinate (TOPROL-XL) 50 MG 24 hr tablet, Take 1 tablet (50 mg total) by mouth 2 (two) times daily., Disp: 180 tablet, Rfl: 3 .   Multiple Vitamin (MULTIVITAMIN) capsule, Take 1 capsule by mouth daily.  , Disp: , Rfl:  .  Multiple Vitamins-Minerals (EMERGEN-C IMMUNE PO), Take 1 tablet by mouth daily. , Disp: , Rfl:  .  Omega-3 Fatty Acids (FISH OIL) 1000 MG CAPS, Take 1 capsule by mouth daily. , Disp: , Rfl:  .  potassium chloride SA (K-DUR,KLOR-CON) 20 MEQ tablet, Take 1 tablet (20 mEq total) by mouth 2 (two) times daily. Please make yearly appt with Dr. Meda Coffee for May. 1st attempt, Disp: 180 tablet, Rfl: 1 .  Psyllium (METAMUCIL FIBER PO), Take 1 Dose by mouth daily. , Disp: , Rfl:  .  ranitidine (ZANTAC) 75 MG tablet, Take 75 mg by mouth daily as needed for heartburn. , Disp: , Rfl:  .  tamsulosin (FLOMAX) 0.4 MG CAPS capsule, TAKE ONE CAPSULE BY MOUTH DAILY, Disp: 90 capsule, Rfl: 1 .  warfarin (COUMADIN) 5 MG tablet, TAKE AS DIRECTED BY COUMADIN CLINIC, Disp: 30 tablet, Rfl: 3   Allergies  Allergen Reactions  . Ace Inhibitors Other (See Comments)    Other reaction(s): Other (See Comments) High K+  . Codeine     Other reaction(s): Other (See Comments) Hallucinations  . Spironolactone     Increases potassium   Review of Systems:   Pertinent items are noted in the HPI. Otherwise, ROS is negative.  Vitals:  Vitals:   05/13/17 1429  BP: 138/78  Pulse: 63  Temp: (!) 97.5 F (36.4 C)  TempSrc: Oral  SpO2: 97%  Weight: 241 lb 3.2 oz (109.4 kg)  Height: 6' 1"  (1.854 m)     Body mass index is 31.82 kg/m.  Physical Exam:   Physical Exam  Constitutional: He is oriented to person, place, and time. He appears well-developed and well-nourished. No distress.  HENT:  Head: Normocephalic and atraumatic.  Right Ear: External ear normal.  Left Ear: External ear normal.  Nose: Mucosal edema and rhinorrhea present.  Mouth/Throat: Oropharynx is clear and moist.  Eyes: Conjunctivae and EOM are normal. Pupils are equal, round, and reactive to light.  Neck: Normal range of motion. Neck supple.  Cardiovascular:  Normal rate, regular rhythm, normal heart sounds and intact distal pulses.  Pulmonary/Chest: Effort normal and breath sounds normal.  Cough.  Abdominal: Soft. Bowel sounds are normal.  Musculoskeletal: Normal range of motion.  Neurological: He is alert and oriented to person, place, and time.  Skin: Skin is warm and dry.  Psychiatric: He has a normal mood and affect. His behavior is normal. Judgment and thought content normal.  Nursing note and vitals reviewed.   Results for orders placed or performed in visit on 04/17/17  POCT INR  Result Value Ref Range   INR 2.2    Assessment and Plan:   Patient Active Problem List   Diagnosis Date Noted  . Umbilical hernia   . OSA on CPAP   . Hypertension   . History of neck surgery   . DOE (dyspnea on exertion)   . COPD (chronic obstructive pulmonary disease) ([place])   . Atrial fibrillation ([place])   . Gallstone 04/26/2016  . NASH (nonalcoholic steatohepatitis) 04/26/2016  . Obesity (BMI 30.0-34.9) 04/26/2016  . Osteoarthrosis involving lower leg 05/21/2015  . Acute diastolic heart failure ([place]) 02/16/2015  . Benign non-nodular prostatic hyperplasia 01/11/2014  . Lower extremity edema 08/24/2013  . Cryoglobulinemia ([place]) 01/09/2013  . Systolic murmur of aorta 40/98/1191  . Elevated prostate specific antigen (PSA) 01/10/2012  . 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 Anemia, hemolytic ([place]) 01/22/2011  . Edema 12/26/2010  . Long term (current) use of anticoagulants 06/20/2010  . PAF (paroxysmal atrial fibrillation) ([place]) 06/09/2010  . Atherosclerosis of native arteries of extremity with rest pain ([place]) 02/14/2010  . Spinal stenosis of lumbar region with neurogenic claudication 02/14/2010  . Thoracic or lumbosacral neuritis or radiculitis, unspecified 01/10/2010  . Chronic obstructive pulmonary disease ([place]) 08/10/2009  . Obstructive sleep apnea syndrome 03/02/2008  . Hyperlipidemia 03/01/2008  . Gastroesophageal reflux disease with  esophagitis 11/27/2007   [name] was seen today for uri.  Diagnoses and all orders for this visit:  Influenza-like illness Comments: -     oseltamivir (TAMIFLU) 75 MG capsule; Take 1 capsule (75 mg total) by mouth 2 (two) times daily for 5 days. -     benzonatate (TESSALON) 200 MG capsule; Take 1 capsule (200 mg total) by mouth 2 (two) times daily as needed for cough.  Chronic obstructive pulmonary disease, unspecified COPD type ([place]) Comments: Safety net Rx provided since patient is going on vacation in 2 weeks. Doxycycline (VIBRA-TABS) 100 MG tablet; Take 1 tablet (100 mg total) by mouth 2 (two) times daily.  Meds ordered this encounter  Medications  . oseltamivir (TAMIFLU) 75 MG capsule    Sig: Take 1 capsule (75 mg total) by mouth 2 (two) times daily for 5 days.  Dispense:  10 capsule    Refill:  0  . benzonatate (TESSALON) 200 MG capsule    Sig: Take 1 capsule (200 mg total) by mouth 2 (two) times daily as needed for cough.    Dispense:  20 capsule    Refill:  0  . doxycycline (VIBRA-TABS) 100 MG tablet    Sig: Take 1 tablet (100 mg total) by mouth 2 (two) times daily.    Dispense:  20 tablet    Refill:  0    . Reviewed expectations re: course of current medical issues. . Discussed self-management of symptoms. . Outlined signs and symptoms indicating need for more acute intervention. . Patient verbalized understanding and all questions were answered. Marland Kitchen Health Maintenance issues including appropriate healthy diet, exercise, and smoking avoidance were discussed with patient. . See orders for this visit as documented in the electronic medical record. . Patient received an After Visit Summary.  CMA served as Education administrator during this visit. History, Physical, and Plan performed by medical provider. The above documentation has been reviewed and is accurate and complete. Briscoe Deutscher, D.O.  Briscoe Deutscher, DO [place], [facility] 05/13/2017

## 2017-05-14 ENCOUNTER — Encounter: Payer: Self-pay | Admitting: Family Medicine

## 2017-05-15 ENCOUNTER — Other Ambulatory Visit: Payer: Self-pay | Admitting: Cardiology

## 2017-05-22 ENCOUNTER — Ambulatory Visit (INDEPENDENT_AMBULATORY_CARE_PROVIDER_SITE_OTHER): Payer: Medicare Other | Admitting: General Practice

## 2017-05-22 DIAGNOSIS — Z7901 Long term (current) use of anticoagulants: Secondary | ICD-10-CM | POA: Diagnosis not present

## 2017-05-22 LAB — POCT INR: INR: 1.9

## 2017-05-22 NOTE — Patient Instructions (Addendum)
Pre visit review using our clinic review tool, if applicable. No additional management support is needed unless otherwise documented below in the visit note.  Take 1 tablet today (3/13) and then continue taking 1/2 tablet (0.5 mg) daily except 1 tablet (5 mg) on Tuesday and Friday. Recheck in 6 weeks.

## 2017-05-22 NOTE — Progress Notes (Signed)
I have reviewed and agree with this plan  

## 2017-05-23 ENCOUNTER — Encounter: Payer: Self-pay | Admitting: Adult Health

## 2017-05-24 ENCOUNTER — Encounter: Payer: Self-pay | Admitting: Adult Health

## 2017-05-24 ENCOUNTER — Ambulatory Visit (INDEPENDENT_AMBULATORY_CARE_PROVIDER_SITE_OTHER): Payer: Medicare Other | Admitting: Adult Health

## 2017-05-24 DIAGNOSIS — Z9989 Dependence on other enabling machines and devices: Secondary | ICD-10-CM

## 2017-05-24 DIAGNOSIS — G4733 Obstructive sleep apnea (adult) (pediatric): Secondary | ICD-10-CM | POA: Diagnosis not present

## 2017-05-24 DIAGNOSIS — E669 Obesity, unspecified: Secondary | ICD-10-CM | POA: Diagnosis not present

## 2017-05-24 NOTE — Addendum Note (Signed)
Addended by: Parke Poisson E on: 05/24/2017 02:59 PM   Modules accepted: Orders

## 2017-05-24 NOTE — Assessment & Plan Note (Signed)
  Doing well on CPAP  Plan  Patient Instructions  Continue on CPAP At bedtime   Keep up good work  Work on [employer] .  Follow up with Dr. Elsworth Soho  In 1 year and As needed

## 2017-05-24 NOTE — Assessment & Plan Note (Signed)
Wt loss  

## 2017-05-24 NOTE — Patient Instructions (Signed)
Continue on CPAP At bedtime   Keep up good work  Work on [employer] .  Follow up with Dr. Elsworth Soho  In 1 year and As needed

## 2017-05-24 NOTE — Progress Notes (Signed)
@Patient  ID: Steven Shepard, male    DOB: 19-May-1945, 72 y.o.   MRN: [id number]  Chief Complaint  Patient presents with  . Follow-up    OSA     Referring provider: Briscoe Deutscher, DO  HPI: 72 year old male followed for obstructive sleep apnea  05/24/2017 Follow up : OSA  Patient presents for a one year follow-up for sleep apnea.  Patient says he is doing well on CPAP he does not feel any significant daytime sleepiness.  Download shows excellent compliance with average usage at 7 hours.  Patient is on CPAP 9 cm H2O.  AHI 0.1.  Minimum leaks. Patient is wearing his CPAP and feels like he benefits from his CPAP  Allergies  Allergen Reactions  . Ace Inhibitors Other (See Comments)    Other reaction(s): Other (See Comments) High K+  . Codeine     Other reaction(s): Other (See Comments) Hallucinations  . Spironolactone     Increases potassium    Immunization History  Administered Date(s) Administered  . Influenza Split 12/11/2011, 12/10/2012  . Influenza, High Dose Seasonal PF 01/09/2012, 01/09/2013, 01/11/2014, 12/14/2015  . Influenza-Unspecified 12/10/2013, 01/11/2014, 11/25/2016  . Pneumococcal Conjugate-13 11/15/2015  . Pneumococcal Polysaccharide-23 09/09/2009, 06/07/2010  . Tdap 11/20/2005  . Zoster 03/12/2010    Past Medical History:  Diagnosis Date  . Anemia, hemolytic ([place]) 01/22/2011  . Atrial fibrillation ([place])   . 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 COPD (chronic obstructive pulmonary disease) ([place])   . DOE (dyspnea on exertion)   . History of neck surgery   . Hyperlipidemia   . Hypertension   . OSA on CPAP   . Umbilical hernia     Tobacco History: Social History   Tobacco Use  Smoking Status Former Smoker  . Packs/day: 1.50  . Years: 3.00  . Pack years: 4.50  . Types: Cigarettes, Pipe  . Last attempt to quit: 03/12/1973  . Years since quitting: 44.2  Smokeless Tobacco Never Used  Tobacco Comment   quit appox 36 years ago    Counseling given: Not Answered Comment: quit appox 36 years ago   Outpatient Encounter Medications as of 05/24/2017  Medication Sig  . acetaminophen (TYLENOL) 650 MG CR tablet Take 650 mg by mouth 2 (two) times daily as needed for pain.   Marland Kitchen aspirin 81 MG tablet Take 81 mg by mouth daily.    Marland Kitchen atorvastatin (LIPITOR) 20 MG tablet Take 1 tablet (20 mg total) daily by mouth.  . benzonatate (TESSALON) 200 MG capsule Take 1 capsule (200 mg total) by mouth 2 (two) times daily as needed for cough.  . CARTIA XT 180 MG 24 hr capsule TAKE 2 CAPSULES BY MOUTH EVERY DAY  . cloNIDine (CATAPRES) 0.3 MG tablet TAKE 1 TABLET BY MOUTH TWICE DAILY  . doxycycline (VIBRA-TABS) 100 MG tablet Take 1 tablet (100 mg total) by mouth 2 (two) times daily.  Marland Kitchen Fexofenadine HCl (ALLEGRA PO) Take 1 tablet by mouth every morning.   . folic acid (FOLVITE) 921 MCG tablet Take 400 mcg by mouth 2 (two) times daily.  . furosemide (LASIX) 40 MG tablet TAKE 1 TABLET BY MOUTH AT 8 AM AND 1 TABLET AT 2 PM  . metFORMIN (GLUCOPHAGE) 1000 MG tablet Take 1 tablet (1,000 mg total) by mouth 2 (two) times daily with a meal.  . metoprolol succinate (TOPROL-XL) 50 MG 24 hr tablet Take 1 tablet (50 mg total) by mouth 2 (two) times daily.  . Multiple Vitamin (MULTIVITAMIN) capsule Take  1 capsule by mouth daily.    . Multiple Vitamins-Minerals (EMERGEN-C IMMUNE PO) Take 1 tablet by mouth daily.   . Omega-3 Fatty Acids (FISH OIL) 1000 MG CAPS Take 1 capsule by mouth daily.   . potassium chloride SA (K-DUR,KLOR-CON) 20 MEQ tablet Take 1 tablet (20 mEq total) by mouth 2 (two) times daily. Please make yearly appt with Dr. Meda Coffee for May. 1st attempt  . Psyllium (METAMUCIL FIBER PO) Take 1 Dose by mouth daily.   . ranitidine (ZANTAC) 75 MG tablet Take 75 mg by mouth daily as needed for heartburn.   . tamsulosin (FLOMAX) 0.4 MG CAPS capsule TAKE ONE CAPSULE BY MOUTH DAILY  . warfarin (COUMADIN) 5 MG tablet TAKE AS DIRECTED BY COUMADIN CLINIC    No facility-administered encounter medications on file as of 05/24/2017.      Review of Systems  Constitutional:   No  weight loss, night sweats,  Fevers, chills, fatigue, or  lassitude.  HEENT:   No headaches,  Difficulty swallowing,  Tooth/dental problems, or  Sore throat,                No sneezing, itching, ear ache, nasal congestion, post nasal drip,   CV:  No chest pain,  Orthopnea, PND, swelling in lower extremities, anasarca, dizziness, palpitations, syncope.   GI  No heartburn, indigestion, abdominal pain, nausea, vomiting, diarrhea, change in bowel habits, loss of appetite, bloody stools.   Resp: No shortness of breath with exertion or at rest.  No excess mucus, no productive cough,  No non-productive cough,  No coughing up of blood.  No change in color of mucus.  No wheezing.  No chest wall deformity  Skin: no rash or lesions.  GU: no dysuria, change in color of urine, no urgency or frequency.  No flank pain, no hematuria   MS:  No joint pain or swelling.  No decreased range of motion.  No back pain.    Physical Exam  BP 108/63 (BP Location: Left Arm, Cuff Size: Normal)   Pulse 63   Ht 6' 1"  (1.854 m)   Wt 235 lb (106.6 kg)   SpO2 95%   BMI 31.00 kg/m   GEN: A/Ox3; pleasant , NAD, obese    HEENT:  [place]/AT,  EACs-clear, TMs-wnl, NOSE-clear, THROAT-clear, no lesions, no postnasal drip or exudate noted. Class 2-3 MP airway   NECK:  Supple w/ fair ROM; no JVD; normal carotid impulses w/o bruits; no thyromegaly or nodules palpated; no lymphadenopathy.    RESP  Clear  P & A; w/o, wheezes/ rales/ or rhonchi. no accessory muscle use, no dullness to percussion  CARD:  RRR, no m/r/g, no peripheral edema, pulses intact, no cyanosis or clubbing.  GI:   Soft & nt; nml bowel sounds; no organomegaly or masses detected.   Musco: Warm bil, no deformities or joint swelling noted.   Neuro: alert, no focal deficits noted.    Skin: Warm, no lesions or rashes    Lab  Results:  CBC    Component Value Date/Time   WBC 7.3 03/20/2017 1441   RBC 6.05 (H) 03/01/2017 1352   HGB 12.2 (L) 03/20/2017 1441   HGB 13.3 12/25/2013 1313   HCT 53.7 (H) 03/01/2017 1352   HCT Unable to Determine 12/25/2013 1313   PLT 234.0 03/20/2017 1441   PLT 190 12/25/2013 1313   MCV 88.8 03/01/2017 1352   MCV Unable to Determine 12/25/2013 1313   MCH 28.6 03/01/2017 1352   MCHC 32.2  03/01/2017 1352   RDW 15.2 (H) 03/01/2017 1352   RDW Cold Agglutinin 12/25/2013 1313   LYMPHSABS 2,898 03/01/2017 1352   LYMPHSABS 1.4 12/25/2013 1313   MONOABS 0.6 02/16/2015 1257   EOSABS 138 03/01/2017 1352   EOSABS 0.1 12/25/2013 1313   BASOSABS 37 03/01/2017 1352   BASOSABS 0.0 12/25/2013 1313    BMET    Component Value Date/Time   NA 138 03/01/2017 0917   K 3.9 03/01/2017 0917   CL 102 03/01/2017 0917   CO2 30 03/01/2017 0917   GLUCOSE 172 (H) 03/01/2017 0917   BUN 19 03/01/2017 0917   CREATININE 0.96 03/01/2017 0917   CREATININE 0.77 02/16/2015 1257   CALCIUM 8.9 03/01/2017 0917   GFRNONAA >60 09/20/2010 0443   GFRAA >60 09/20/2010 0443    BNP    Component Value Date/Time   BNP 21.8 02/16/2015 1257    ProBNP No results found for: PROBNP  Imaging: No results found.   Assessment & Plan:   OSA on CPAP  Doing well on CPAP  Plan  Patient Instructions  Continue on CPAP At bedtime   Keep up good work  Work on [employer] .  Follow up with Dr. Elsworth Soho  In 1 year and As needed       Obesity (BMI 30.0-34.9) Wt loss      Rexene Edison, NP 05/24/2017

## 2017-07-03 ENCOUNTER — Ambulatory Visit (INDEPENDENT_AMBULATORY_CARE_PROVIDER_SITE_OTHER): Payer: Medicare Other | Admitting: General Practice

## 2017-07-03 DIAGNOSIS — Z7901 Long term (current) use of anticoagulants: Secondary | ICD-10-CM

## 2017-07-03 LAB — POCT INR: INR: 1.7

## 2017-07-03 NOTE — Patient Instructions (Addendum)
Pre visit review using our clinic review tool, if applicable. No additional management support is needed unless otherwise documented below in the visit note.  Take 1 tablet today (4/24) and then change dosage and take 1/2 tablet daily except take 1 tablet on Monday/Wed/Fridays and recheck in 4 weeks.

## 2017-07-04 NOTE — Progress Notes (Signed)
I have reviewed this visit and I agree on the patient's plan of dosage and recommendations. Briscoe Deutscher, DO

## 2017-07-23 DIAGNOSIS — E119 Type 2 diabetes mellitus without complications: Secondary | ICD-10-CM | POA: Diagnosis not present

## 2017-07-23 LAB — HM DIABETES EYE EXAM

## 2017-07-24 ENCOUNTER — Other Ambulatory Visit: Payer: Self-pay | Admitting: Family Medicine

## 2017-07-30 DIAGNOSIS — Z85828 Personal history of other malignant neoplasm of skin: Secondary | ICD-10-CM | POA: Diagnosis not present

## 2017-07-30 DIAGNOSIS — L570 Actinic keratosis: Secondary | ICD-10-CM | POA: Diagnosis not present

## 2017-07-30 DIAGNOSIS — D225 Melanocytic nevi of trunk: Secondary | ICD-10-CM | POA: Diagnosis not present

## 2017-07-30 DIAGNOSIS — D692 Other nonthrombocytopenic purpura: Secondary | ICD-10-CM | POA: Diagnosis not present

## 2017-07-30 DIAGNOSIS — Z85820 Personal history of malignant melanoma of skin: Secondary | ICD-10-CM | POA: Diagnosis not present

## 2017-07-31 ENCOUNTER — Ambulatory Visit (INDEPENDENT_AMBULATORY_CARE_PROVIDER_SITE_OTHER): Payer: Medicare Other | Admitting: General Practice

## 2017-07-31 DIAGNOSIS — Z7901 Long term (current) use of anticoagulants: Secondary | ICD-10-CM | POA: Diagnosis not present

## 2017-07-31 LAB — POCT INR: INR: 2.5 (ref 2.0–3.0)

## 2017-07-31 NOTE — Patient Instructions (Addendum)
Pre visit review using our clinic review tool, if applicable. No additional management support is needed unless otherwise documented below in the visit note.  Continue to take 1/2 tablet daily except take 1 tablet on Monday/Wed/Fridays and recheck in 4 weeks.

## 2017-07-31 NOTE — Progress Notes (Signed)
I have reviewed this visit and I agree on the patient's plan of dosage and recommendations. Briscoe Deutscher, DO

## 2017-08-07 ENCOUNTER — Other Ambulatory Visit: Payer: Self-pay | Admitting: Cardiology

## 2017-08-22 IMAGING — US US ABDOMEN COMPLETE
1 series · 13 of 25 positions shown · non-contrast
Comparison: 11/12/2014

CLINICAL DATA: Generalized abdominal pain

EXAM:
ABDOMEN ULTRASOUND COMPLETE

[Series 1: us abdomen complete · 0.19mm/px · 13 of 98 slices shown]
[im 1/98]
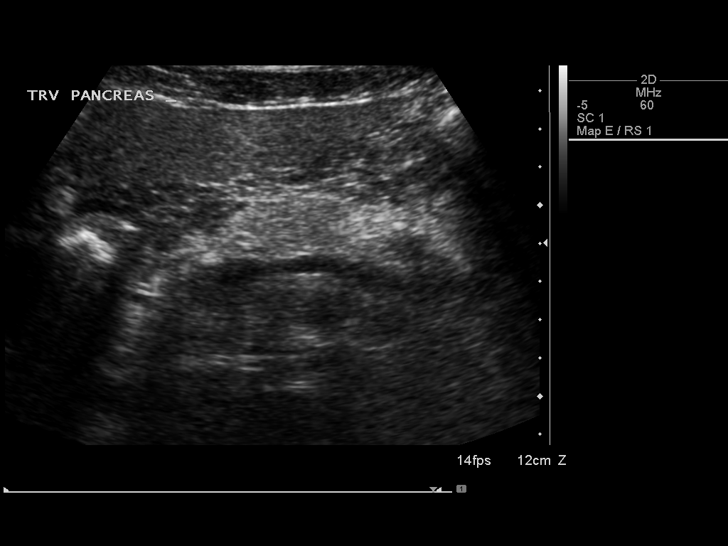
[im 9/98]
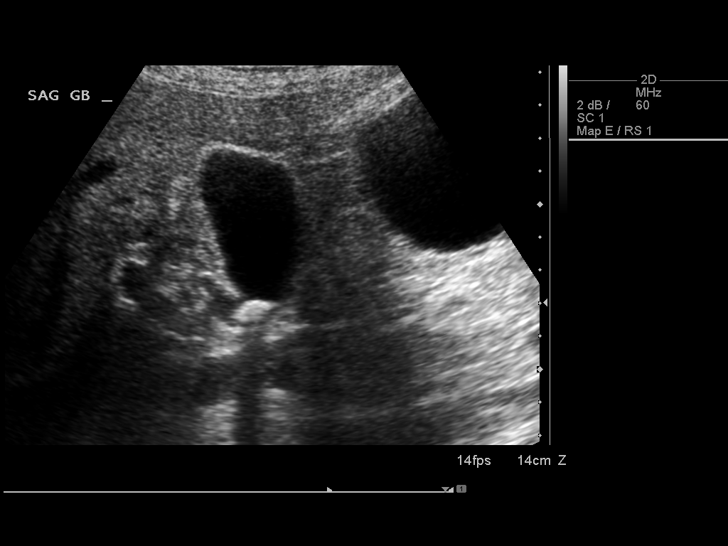
[im 17/98]
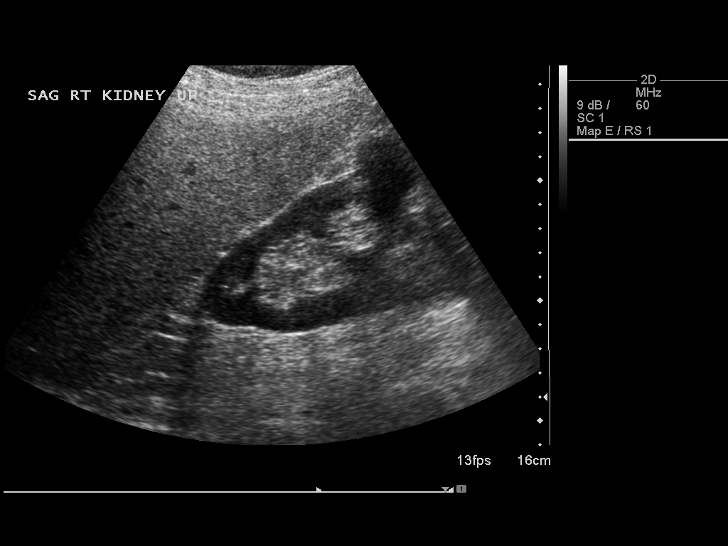
[im 25/98]
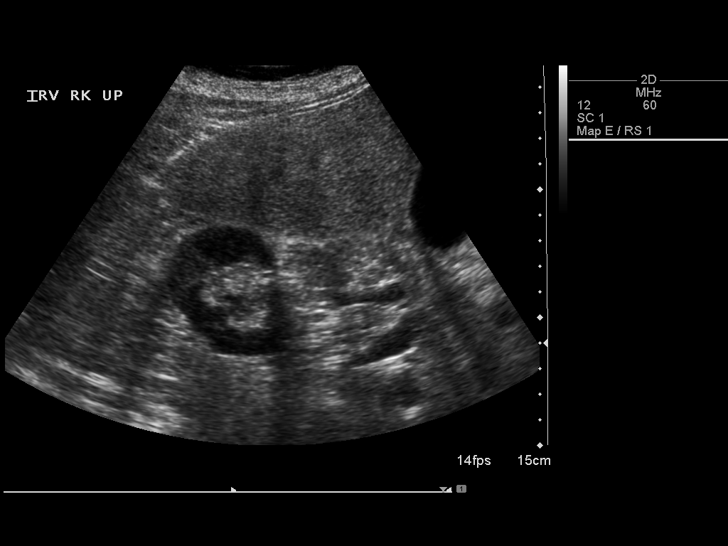
[im 33/98]
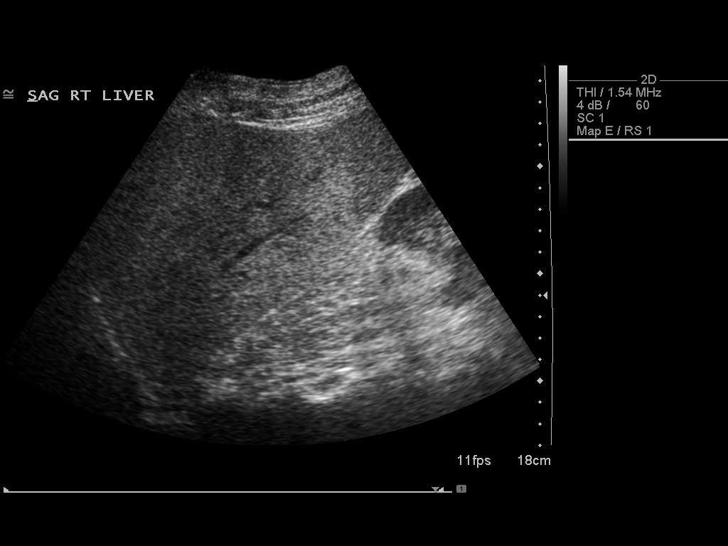
[im 41/98]
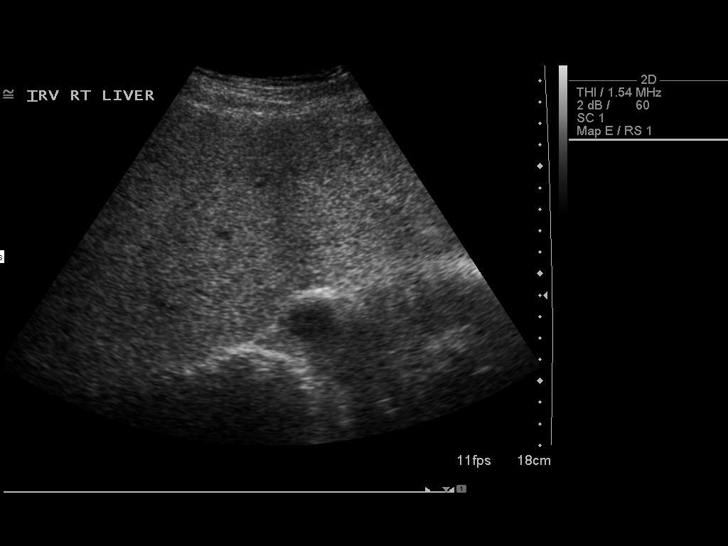
[im 49/98]
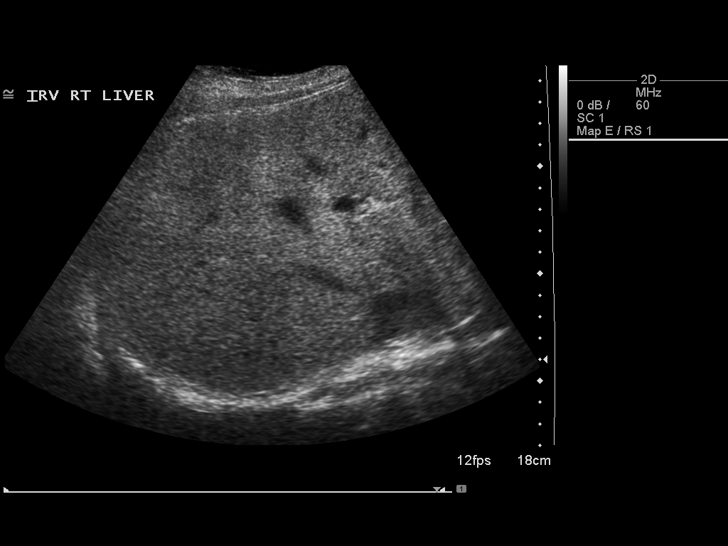
[im 57/98]
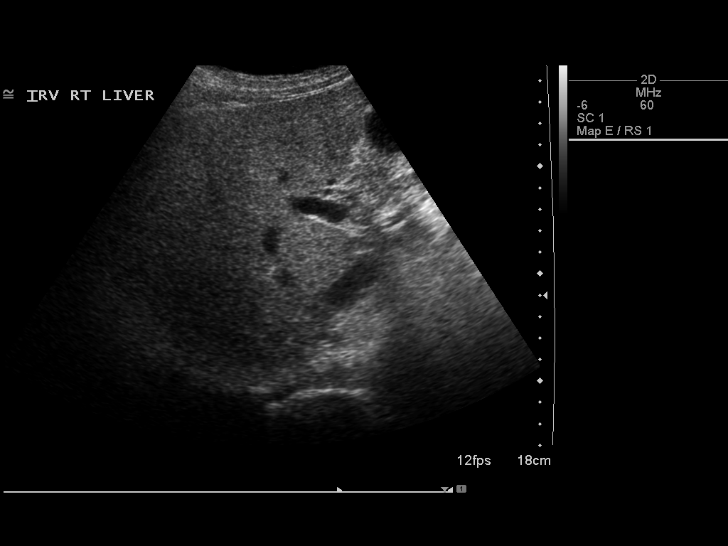
[im 65/98]
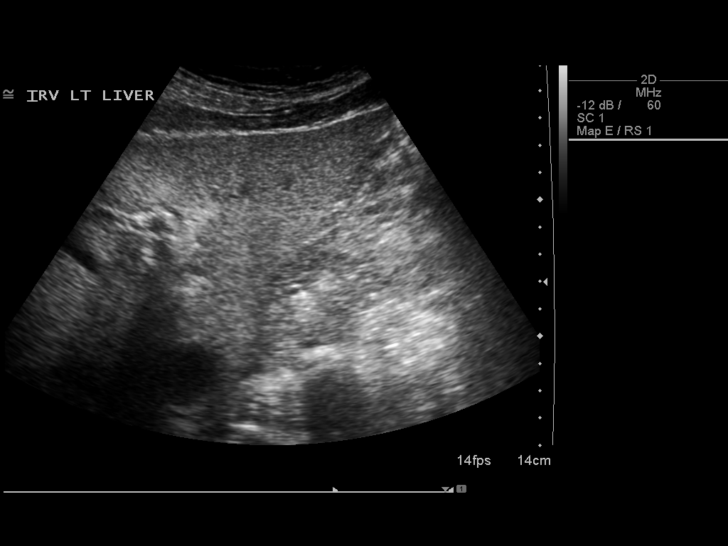
[im 73/98]
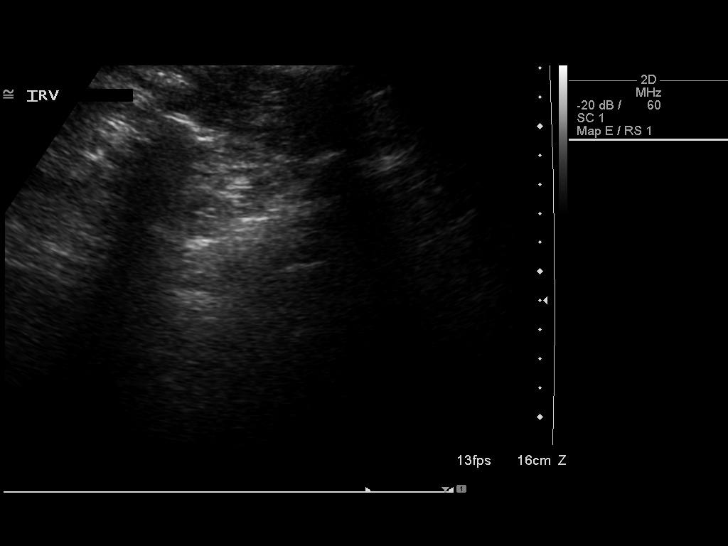
[im 81/98]
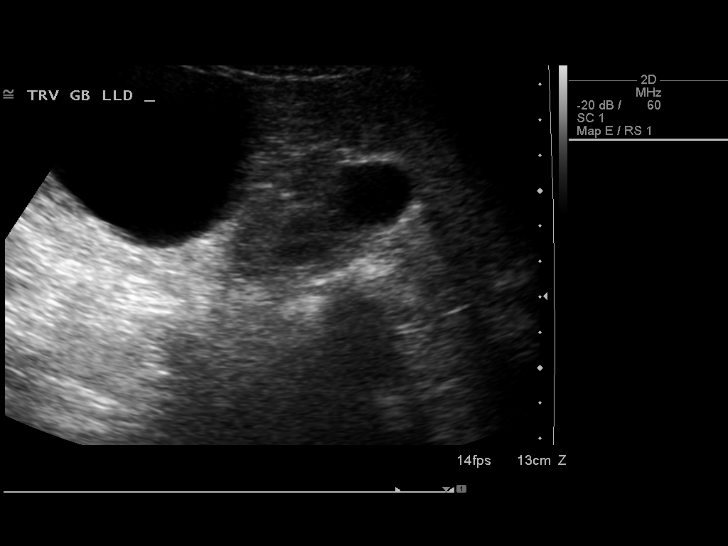
[im 89/98]
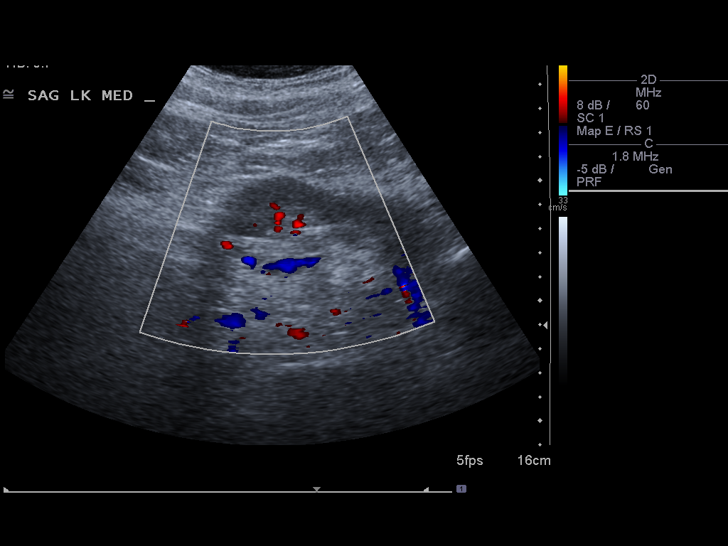
[im 98/98]
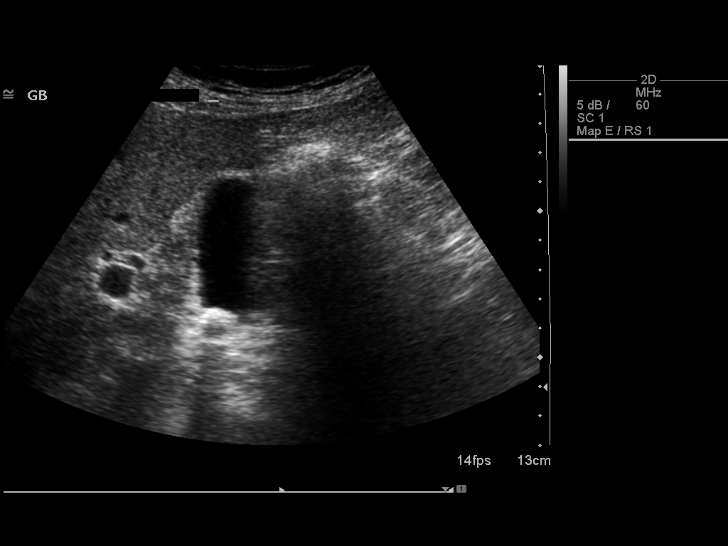

[13 of 25 positions shown; findings below may reference images not displayed]

FINDINGS: Gallbladder: Gallstones are noted within gallbladder the largest
measures 1.2 cm. There is focal thickening of gallbladder wall up to
7 mm. No sonographic Murphy's sign

Common bile duct: Diameter: 2.6 mm in diameter within normal limits

Again noted mild increased echogenicity of the liver probable fatty
infiltration. Probable cyst anterior aspect of the liver measures
1.6 cm. On the prior exam the house

IVC: No abnormality visualized.

Pancreas: Limited assessment due to abundant bowel gas

Spleen: Size and appearance within normal limits. Measures 8.8 cm in
length

Right Kidney: Length: 12.9 cm. Normal echogenicity. No
hydronephrosis. There is a cyst in mid pole measures 5.7 x 5.8 cm

Left Kidney: Length: 12 cm. Echogenicity within normal limits. No
mass or hydronephrosis visualized.

Abdominal aorta: No aneurysm visualized. Measures up to 2.9 cm in
diameter.

Other findings: None.
IMPRESSION: 1. Gallstone within gallbladder measures 1.2 cm.
2. There is focal thickening of gallbladder wall up to 7 mm. Focal
adenomyomatosis cannot be excluded.
3. Normal CBD.
4. No hydronephrosis.  Right renal cyst measures 5.8 cm.
5. No aortic aneurysm.
6. Again noted probable fatty infiltration of the liver. Probable
cyst anterior aspect of the right hepatic lobe measures 1.6 cm.

## 2017-08-28 ENCOUNTER — Ambulatory Visit (INDEPENDENT_AMBULATORY_CARE_PROVIDER_SITE_OTHER): Payer: Medicare Other | Admitting: General Practice

## 2017-08-28 ENCOUNTER — Other Ambulatory Visit: Payer: Self-pay | Admitting: Family Medicine

## 2017-08-28 DIAGNOSIS — Z7901 Long term (current) use of anticoagulants: Secondary | ICD-10-CM | POA: Diagnosis not present

## 2017-08-28 LAB — POCT INR: INR: 2.9 (ref 2.0–3.0)

## 2017-08-28 NOTE — Progress Notes (Signed)
I have reviewed this visit and I agree on the patient's plan of dosage and recommendations. Briscoe Deutscher, DO

## 2017-08-28 NOTE — Patient Instructions (Addendum)
Pre visit review using our clinic review tool, if applicable. No additional management support is needed unless otherwise documented below in the visit note. Continue to take 1/2 tablet daily except take 1 tablet on Monday/Wed/Fridays and recheck in 4 weeks.

## 2017-09-12 ENCOUNTER — Other Ambulatory Visit: Payer: Self-pay | Admitting: Cardiology

## 2017-09-12 DIAGNOSIS — E7849 Other hyperlipidemia: Secondary | ICD-10-CM

## 2017-09-12 DIAGNOSIS — I110 Hypertensive heart disease with heart failure: Secondary | ICD-10-CM

## 2017-09-25 ENCOUNTER — Ambulatory Visit (INDEPENDENT_AMBULATORY_CARE_PROVIDER_SITE_OTHER): Payer: Medicare Other | Admitting: General Practice

## 2017-09-25 DIAGNOSIS — Z7901 Long term (current) use of anticoagulants: Secondary | ICD-10-CM | POA: Diagnosis not present

## 2017-09-25 LAB — POCT INR: INR: 2.4 (ref 2.0–3.0)

## 2017-09-25 NOTE — Patient Instructions (Addendum)
Pre visit review using our clinic review tool, if applicable. No additional management support is needed unless otherwise documented below in the visit note.  Continue to take 1/2 tablet daily except take 1 tablet on Monday/Wed/Fridays and recheck in 6 weeks.

## 2017-09-25 NOTE — Progress Notes (Signed)
I have reviewed this visit and I agree on the patient's plan of dosage and recommendations. Briscoe Deutscher, DO

## 2017-09-29 ENCOUNTER — Other Ambulatory Visit: Payer: Self-pay | Admitting: Family Medicine

## 2017-09-30 ENCOUNTER — Encounter: Payer: Self-pay | Admitting: Cardiology

## 2017-10-04 ENCOUNTER — Other Ambulatory Visit: Payer: Self-pay | Admitting: Cardiology

## 2017-10-04 DIAGNOSIS — I5031 Acute diastolic (congestive) heart failure: Secondary | ICD-10-CM

## 2017-10-04 DIAGNOSIS — I480 Paroxysmal atrial fibrillation: Secondary | ICD-10-CM

## 2017-10-16 DIAGNOSIS — M4726 Other spondylosis with radiculopathy, lumbar region: Secondary | ICD-10-CM | POA: Diagnosis not present

## 2017-10-16 DIAGNOSIS — M48062 Spinal stenosis, lumbar region with neurogenic claudication: Secondary | ICD-10-CM | POA: Diagnosis not present

## 2017-10-16 DIAGNOSIS — M542 Cervicalgia: Secondary | ICD-10-CM | POA: Diagnosis not present

## 2017-10-16 DIAGNOSIS — M4316 Spondylolisthesis, lumbar region: Secondary | ICD-10-CM | POA: Diagnosis not present

## 2017-10-16 DIAGNOSIS — M5136 Other intervertebral disc degeneration, lumbar region: Secondary | ICD-10-CM | POA: Diagnosis not present

## 2017-10-23 DIAGNOSIS — K7581 Nonalcoholic steatohepatitis (NASH): Secondary | ICD-10-CM | POA: Diagnosis not present

## 2017-10-23 DIAGNOSIS — K8020 Calculus of gallbladder without cholecystitis without obstruction: Secondary | ICD-10-CM | POA: Diagnosis not present

## 2017-10-23 DIAGNOSIS — Z7901 Long term (current) use of anticoagulants: Secondary | ICD-10-CM | POA: Diagnosis not present

## 2017-10-23 DIAGNOSIS — I480 Paroxysmal atrial fibrillation: Secondary | ICD-10-CM | POA: Diagnosis not present

## 2017-10-24 ENCOUNTER — Ambulatory Visit: Payer: Medicare Other | Admitting: Cardiology

## 2017-10-29 DIAGNOSIS — D225 Melanocytic nevi of trunk: Secondary | ICD-10-CM | POA: Diagnosis not present

## 2017-10-29 DIAGNOSIS — D2372 Other benign neoplasm of skin of left lower limb, including hip: Secondary | ICD-10-CM | POA: Diagnosis not present

## 2017-10-29 DIAGNOSIS — L814 Other melanin hyperpigmentation: Secondary | ICD-10-CM | POA: Diagnosis not present

## 2017-10-29 DIAGNOSIS — D1801 Hemangioma of skin and subcutaneous tissue: Secondary | ICD-10-CM | POA: Diagnosis not present

## 2017-10-29 DIAGNOSIS — L570 Actinic keratosis: Secondary | ICD-10-CM | POA: Diagnosis not present

## 2017-10-29 DIAGNOSIS — L821 Other seborrheic keratosis: Secondary | ICD-10-CM | POA: Diagnosis not present

## 2017-10-29 DIAGNOSIS — Z85828 Personal history of other malignant neoplasm of skin: Secondary | ICD-10-CM | POA: Diagnosis not present

## 2017-10-30 ENCOUNTER — Ambulatory Visit (INDEPENDENT_AMBULATORY_CARE_PROVIDER_SITE_OTHER): Payer: Medicare Other | Admitting: General Practice

## 2017-10-30 DIAGNOSIS — Z7901 Long term (current) use of anticoagulants: Secondary | ICD-10-CM | POA: Diagnosis not present

## 2017-10-30 LAB — POCT INR: INR: 2.6 (ref 2.0–3.0)

## 2017-10-30 NOTE — Patient Instructions (Addendum)
Pre visit review using our clinic review tool, if applicable. No additional management support is needed unless otherwise documented below in the visit note.  Continue to take 1/2 tablet daily except take 1 tablet on Monday/Wed/Fridays and recheck in 6 weeks.

## 2017-10-30 NOTE — Progress Notes (Signed)
I have reviewed this visit and I agree on the patient's plan of dosage and recommendations. Briscoe Deutscher, DO

## 2017-11-06 ENCOUNTER — Ambulatory Visit: Payer: Medicare Other

## 2017-11-11 ENCOUNTER — Encounter: Payer: Self-pay | Admitting: Physician Assistant

## 2017-11-11 NOTE — Progress Notes (Addendum)
Cardiology Office Note    Date:  11/12/2017  ID:  Steven Shepard, DOB 27-Oct-1945, MRN [id number] PCP:  Briscoe Deutscher, DO  Cardiologist:  Ena Dawley, MD   Chief Complaint: f/u atrial fib  History of Present Illness:  Steven Shepard is a 72 y.o. male with history of PAF, HTN, HLD (followed by PCP), habitual alcohol intake, mild AS, edema felt 2/2 chronic diastolic CHF, hemolytic anemia, pre-DM, cold agglutinin disease, COPD, OSA on CPAP who presents for 6 month f/u. He was remotely followed by Dr. Verl Blalock and more recently Dr. Meda Coffee. In 2011 he was admitted for acute kidney insufficiency and hyperkalemia >7.5 in setting of spironolactone and benazepril which were stopped. LE venous duplex 2012 was negative for DVT. Last 2D echo 12/2015 showed mod LVH, EF 55-60%, grade 2 DD, mild AS, mild RAE. Had normal ABIs 2015. No prior ischemic eval needed. Last labs 03/2017 Hgb 12.2, 02/2017 Cr 0.96, persistently elevated TBili 1.8 otherwise LFTs wnl. Prior RUQ [place] 2018 showed fatty infiltration without definite cirrhotic changes. He is followed by Dr. Oletta Lamas with GI. His cold agglutinin disease was discovered incidentally during pre-op bloodwork for hip surgery several years ago.  He returns for routine follow-up. Overall he is doing great from a cardiac standpoint. He walks the dog regularly and has no functional limitation. He has not had a major episode of atrial fib since 2 years back in [place]. He does notice fleeting occasional palpitations but they pass. He now knows he has to limit EtOH otherwise this has triggered his events in the past. No unusual bleeding. Continues to have mild intermittent LEE which is improved with walking, more pronounced when legs are dangling down.  Past Medical History:  Diagnosis Date  . Anemia, hemolytic ([place]) 01/22/2011  . 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[place]) 01/23/2011  . COPD (chronic obstructive pulmonary disease) ([place])   . DOE (dyspnea on exertion)   .  Elevated bilirubin   . Fatty liver   . History of neck surgery   . Hyperlipidemia   . Hypertension   . OSA on CPAP   . PAF (paroxysmal atrial fibrillation) ([place])   . Pre-diabetes   . Umbilical hernia     Past Surgical History:  Procedure Laterality Date  . ADENOIDECTOMY    . HIATAL HERNIA REPAIR  2011  . NECK SURGERY  2005  . SKIN SURGERY     skin cancer  . TONSILLECTOMY    . TOTAL HIP ARTHROPLASTY Right 09/18/2010   Dr. Maureen Ralphs    Current Medications: Current Meds  Medication Sig  . acetaminophen (TYLENOL) 650 MG CR tablet Take 650 mg by mouth 2 (two) times daily as needed for pain.   Marland Kitchen aspirin 81 MG tablet Take 81 mg by mouth daily.    Marland Kitchen atorvastatin (LIPITOR) 20 MG tablet TAKE 1 TABLET BY MOUTH DAILY  . benzonatate (TESSALON) 200 MG capsule Take 1 capsule (200 mg total) by mouth 2 (two) times daily as needed for cough.  . CARTIA XT 180 MG 24 hr capsule TAKE 2 CAPSULES BY MOUTH EVERY DAY  . cloNIDine (CATAPRES) 0.3 MG tablet TAKE 1 TABLET BY MOUTH TWICE DAILY  . doxycycline (VIBRA-TABS) 100 MG tablet Take 1 tablet (100 mg total) by mouth 2 (two) times daily.  Marland Kitchen Fexofenadine HCl (ALLEGRA PO) Take 1 tablet by mouth every morning.   . folic acid (FOLVITE) 778 MCG tablet Take 400 mcg by mouth 2 (two) times daily.  . furosemide (LASIX) 40 MG tablet Take 1  tablet by mouth twice daily at 8 AM and  2PM. Please keep upcoming appt. Thank you  . metFORMIN (GLUCOPHAGE) 1000 MG tablet Take 1 tablet (1,000 mg total) by mouth 2 (two) times daily with a meal.  . metoprolol succinate (TOPROL-XL) 50 MG 24 hr tablet TAKE 1 TABLET BY MOUTH TWICE DAILY  . Multiple Vitamin (MULTIVITAMIN) capsule Take 1 capsule by mouth daily.    . Multiple Vitamins-Minerals (EMERGEN-C IMMUNE PO) Take 1 tablet by mouth daily.   . Omega-3 Fatty Acids (FISH OIL) 1000 MG CAPS Take 1 capsule by mouth daily.   . potassium chloride SA (K-DUR,KLOR-CON) 20 MEQ tablet TAKE 1 TABLET BY MOUTH TWICE DAILY  . Psyllium  (METAMUCIL FIBER PO) Take 1 Dose by mouth daily.   . ranitidine (ZANTAC) 75 MG tablet Take 75 mg by mouth daily as needed for heartburn.   . tamsulosin (FLOMAX) 0.4 MG CAPS capsule TAKE ONE CAPSULE BY MOUTH EVERY DAY  . warfarin (COUMADIN) 5 MG tablet TAKE AS DIRECTED BY COUMADIN CLINIC    Allergies:   Ace inhibitors; Codeine; and Spironolactone   Social History   Socioeconomic History  . Marital status: Married    Spouse name: Not on file  . Number of children: Not on file  . Years of education: Not on file  . Highest education level: Not on file  Occupational History  . Occupation: Architectural technologist: [employer]  Social Needs  . Financial resource strain: Not on file  . Food insecurity:    Worry: Not on file    Inability: Not on file  . Transportation needs:    Medical: Not on file    Non-medical: Not on file  Tobacco Use  . Smoking status: Former Smoker    Packs/day: 1.50    Years: 3.00    Pack years: 4.50    Types: Cigarettes, Pipe    Last attempt to quit: 03/12/1973    Years since quitting: 44.7  . Smokeless tobacco: Never Used  . Tobacco comment: quit appox 36 years ago  Substance and Sexual Activity  . Alcohol use: Yes    Comment: occasionally, 1-7 drinks per week  . Drug use: No  . Sexual activity: Yes  Lifestyle  . Physical activity:    Days per week: Not on file    Minutes per session: Not on file  . Stress: Not on file  Relationships  . Social connections:    Talks on phone: Not on file    Gets together: Not on file    Attends religious service: Not on file    Active member of club or organization: Not on file    Attends meetings of clubs or organizations: Not on file    Relationship status: Not on file  Other Topics Concern  . Not on file  Social History Narrative  . Not on file     Family History:  The patient's family history includes Cancer in his brother; Coronary artery disease in his father and mother; Diabetes in his brother and sister;  Healthy in his sister; Heart attack in his father and mother; Hypertension in his father, mother, and sister; Liver disease in his brother. There is no history of Colon cancer or Colonic polyp.  ROS:   Please see the history of present illness.   All other systems are reviewed and otherwise negative.    PHYSICAL EXAM:   VS:  BP 138/68   Pulse 64   Ht 6' 1"  (  1.854 m)   Wt 237 lb (107.5 kg)   BMI 31.27 kg/m   BMI: Body mass index is 31.27 kg/m. GEN: Well nourished, well developed WM in no acute distress HEENT: normocephalic, atraumatic Neck: no JVD, carotid bruits, or masses Cardiac: RRR; 3/6 harsh SEM best at RUSB. Relatively preserved S2. No rubs or gallops, minimal BLE edema  Respiratory:  clear to auscultation bilaterally, normal work of breathing GI: soft, nontender, nondistended, + BS MS: no deformity or atrophy Skin: warm and dry, no rash Neuro:  Alert and Oriented x 3, Strength and sensation are intact, follows commands Psych: euthymic mood, full affect  Wt Readings from Last 3 Encounters:  11/12/17 237 lb (107.5 kg)  05/24/17 235 lb (106.6 kg)  05/13/17 241 lb 3.2 oz (109.4 kg)      Studies/Labs Reviewed:   EKG:  EKG was ordered today and personally reviewed by me and demonstrates NSR 64bpm, first degree AVB, no acute ST-T changes.  Recent Labs: 03/01/2017: ALT 35; BUN 19; Creatinine, Ser 0.96; Potassium 3.9; Sodium 138 03/20/2017: Hemoglobin 12.2; Platelets 234.0   Lipid Panel    Component Value Date/Time   CHOL 160 05/16/2016 0806   TRIG 196.0 (H) 05/16/2016 0806   HDL 33.40 (L) 05/16/2016 0806   CHOLHDL 5 05/16/2016 0806   VLDL 39.2 05/16/2016 0806   LDLCALC 88 05/16/2016 0806    Additional studies/ records that were reviewed today include: Summarized above   ASSESSMENT & PLAN:   1. Paroxysmal atrial fib - maintaining NSR. I inquired if he knows why Coumadin was chosen over other agents. He states he was told since the others were fairly new and we  know that Coumadin works, that this should be continued. I do not know if his cold agglutinin disease would affect the NOACs. Therefore, since he is stable on Coumadin, will continue for now. I will reach out to Dr. Meda Coffee to inquire if it is OK to stop his ASA. I do not see anything on UpToDate about aspirin being used for cold agglutinin disease and don't really see another compelling reason why he needs to be on this. He states he is having a physical next month at which time routine labs will be checked - would advise a CBC/BMET given his ongoing anticoagulation. Fortunately he is doing very well. 2. HTN - BP mildly elevated. The patient was instructed to monitor their blood pressure at home and to call if tending to run higher than 681 systolic at home. If that's the case, might consider a renal artery duplex given his history of marked renal insufficiency in setting of ACEI/spiro use remotely. He is already on numerous agents. Consideration could be given to switching metoprolol to carvedilol if needed. Discussed reduction in salt, weight, alcohol. 3. HLD - managed by PCP. 4. Chronic diastolic CHF - volume appears stable, minimal edema on exam. Reviewed 2g sodium restriction, 2L fluid restriction with patient. 5. Mild aortic stenosis - his murmur is quite pronounced. Will update echocardiogram.  Disposition: F/u with Dr. Meda Coffee in 6 months.  Medication Adjustments/Labs and Tests Ordered: Current medicines are reviewed at length with the patient today.  Concerns regarding medicines are outlined above. Medication changes, Labs and Tests ordered today are summarized above and listed in the Patient Instructions accessible in Encounters.   Signed, Charlie Pitter, PA-C  11/12/2017 1:34 PM    [facility] [address], [place], [place]  [address] Phone: [phone]; Fax: [phone]

## 2017-11-12 ENCOUNTER — Ambulatory Visit (INDEPENDENT_AMBULATORY_CARE_PROVIDER_SITE_OTHER): Payer: Medicare Other | Admitting: Physician Assistant

## 2017-11-12 ENCOUNTER — Encounter: Payer: Self-pay | Admitting: Physician Assistant

## 2017-11-12 VITALS — BP 138/68 | HR 64 | Ht 73.0 in | Wt 237.0 lb

## 2017-11-12 DIAGNOSIS — I10 Essential (primary) hypertension: Secondary | ICD-10-CM

## 2017-11-12 DIAGNOSIS — I480 Paroxysmal atrial fibrillation: Secondary | ICD-10-CM

## 2017-11-12 DIAGNOSIS — E785 Hyperlipidemia, unspecified: Secondary | ICD-10-CM

## 2017-11-12 DIAGNOSIS — I5032 Chronic diastolic (congestive) heart failure: Secondary | ICD-10-CM | POA: Diagnosis not present

## 2017-11-12 DIAGNOSIS — I350 Nonrheumatic aortic (valve) stenosis: Secondary | ICD-10-CM

## 2017-11-12 NOTE — Patient Instructions (Addendum)
Medication Instructions:  Your physician recommends that you continue on your current medications as directed. Please refer to the Current Medication list given to you today.   Labwork: None ordered  Testing/Procedures: Your physician has requested that you have an echocardiogram. Echocardiography is a painless test that uses sound waves to create images of your heart. It provides your doctor with information about the size and shape of your heart and how well your heart's chambers and valves are working. This procedure takes approximately one hour. There are no restrictions for this procedure.    Follow-Up: Your physician wants you to follow-up in: 6 MONTHS WITH DR. Johann Capers will receive a reminder letter in the mail two months in advance. If you don't receive a letter, please call our office to schedule the follow-up appointment.   Any Other Special Instructions Will Be Listed Below (If Applicable).  For patients with history of fluid retention, we give them these special instructions:  1. Follow a low-salt diet - you are allowed no more than 2,020m of sodium per day. Watch your fluid intake. In general, you should not be taking in more than 2 liters of fluid per day (no more than 8 glasses per day). This includes sources of water in foods like soup, coffee, tea, milk, etc. 2. Weigh yourself on the same scale at same time of day and keep a log. 3. Call your doctor: (Anytime you feel any of the following symptoms)  - 3lb weight gain overnight or 5lb within a few days - Shortness of breath, with or without a dry hacking cough  - Swelling in the hands, feet or stomach  - If you have to sleep on extra pillows at night in order to breathe  4. Limit alcohol consumption.  IT IS IMPORTANT TO LET YOUR DOCTOR KNOW EARLY ON IF YOU ARE HAVING SYMPTOMS SO WE CAN HELP YOU!  Please monitor your blood pressure occasionally at home. Call our office if you tend to get readings of greater than 130  on the top number or 80 on the bottom number.   Echocardiogram An echocardiogram, or echocardiography, uses sound waves (ultrasound) to produce an image of your heart. The echocardiogram is simple, painless, obtained within a short period of time, and offers valuable information to your health care provider. The images from an echocardiogram can provide information such as:  Evidence of coronary artery disease (CAD).  Heart size.  Heart muscle function.  Heart valve function.  Aneurysm detection.  Evidence of a past heart attack.  Fluid buildup around the heart.  Heart muscle thickening.  Assess heart valve function.  Tell a health care provider about:  Any allergies you have.  All medicines you are taking, including vitamins, herbs, eye drops, creams, and over-the-counter medicines.  Any problems you or family members have had with anesthetic medicines.  Any blood disorders you have.  Any surgeries you have had.  Any medical conditions you have.  Whether you are pregnant or may be pregnant. What happens before the procedure? No special preparation is needed. Eat and drink normally. What happens during the procedure?  In order to produce an image of your heart, gel will be applied to your chest and a wand-like tool (transducer) will be moved over your chest. The gel will help transmit the sound waves from the transducer. The sound waves will harmlessly bounce off your heart to allow the heart images to be captured in real-time motion. These images will then be  recorded.  You may need an IV to receive a medicine that improves the quality of the pictures. What happens after the procedure? You may return to your normal schedule including diet, activities, and medicines, unless your health care provider tells you otherwise. This information is not intended to replace advice given to you by your health care provider. Make sure you discuss any questions you have with your  health care provider. Document Released: 02/24/2000 Document Revised: 10/15/2015 Document Reviewed: 11/03/2012 Elsevier Interactive Patient Education  2017 [employer].  If you need a refill on your cardiac medications before your next appointment, please call your pharmacy.

## 2017-11-15 ENCOUNTER — Other Ambulatory Visit: Payer: Self-pay

## 2017-11-15 ENCOUNTER — Encounter: Payer: Self-pay | Admitting: Physician Assistant

## 2017-11-15 ENCOUNTER — Ambulatory Visit (HOSPITAL_COMMUNITY): Payer: Medicare Other | Attending: Physician Assistant

## 2017-11-15 DIAGNOSIS — I480 Paroxysmal atrial fibrillation: Secondary | ICD-10-CM | POA: Diagnosis not present

## 2017-11-15 DIAGNOSIS — E785 Hyperlipidemia, unspecified: Secondary | ICD-10-CM | POA: Insufficient documentation

## 2017-11-15 DIAGNOSIS — E669 Obesity, unspecified: Secondary | ICD-10-CM | POA: Insufficient documentation

## 2017-11-15 DIAGNOSIS — G4733 Obstructive sleep apnea (adult) (pediatric): Secondary | ICD-10-CM | POA: Insufficient documentation

## 2017-11-15 DIAGNOSIS — I350 Nonrheumatic aortic (valve) stenosis: Secondary | ICD-10-CM

## 2017-11-15 DIAGNOSIS — I119 Hypertensive heart disease without heart failure: Secondary | ICD-10-CM | POA: Insufficient documentation

## 2017-11-15 DIAGNOSIS — J449 Chronic obstructive pulmonary disease, unspecified: Secondary | ICD-10-CM | POA: Insufficient documentation

## 2017-11-16 ENCOUNTER — Other Ambulatory Visit: Payer: Self-pay | Admitting: Family Medicine

## 2017-11-22 ENCOUNTER — Telehealth: Payer: Self-pay | Admitting: Physician Assistant

## 2017-11-22 NOTE — Telephone Encounter (Signed)
Called patient and informed him to stop Aspirin. Patient verbalized understanding.

## 2017-11-22 NOTE — Telephone Encounter (Signed)
Please call pt. Dr. Meda Coffee got back with me and agrees since he is on warfarin, does not need to also be on aspirin at this time. Please instruct to d/c ASA. Dayna Dunn PA-C

## 2017-12-11 ENCOUNTER — Ambulatory Visit: Payer: Medicare Other

## 2017-12-23 ENCOUNTER — Encounter: Payer: Self-pay | Admitting: Family Medicine

## 2017-12-25 ENCOUNTER — Ambulatory Visit (INDEPENDENT_AMBULATORY_CARE_PROVIDER_SITE_OTHER): Payer: Medicare Other | Admitting: General Practice

## 2017-12-25 DIAGNOSIS — Z7901 Long term (current) use of anticoagulants: Secondary | ICD-10-CM

## 2017-12-25 LAB — POCT INR: INR: 2.7 (ref 2.0–3.0)

## 2017-12-25 NOTE — Patient Instructions (Addendum)
Pre visit review using our clinic review tool, if applicable. No additional management support is needed unless otherwise documented below in the visit note.  Continue to take 1/2 tablet daily except take 1 tablet on Monday/Wed/Fridays and recheck in 6 weeks.

## 2017-12-25 NOTE — Progress Notes (Signed)
I have reviewed this visit and I agree on the patient's plan of dosage and recommendations. Briscoe Deutscher, DO

## 2017-12-26 ENCOUNTER — Other Ambulatory Visit: Payer: Medicare Other

## 2017-12-26 ENCOUNTER — Ambulatory Visit: Payer: Medicare Other

## 2018-01-01 ENCOUNTER — Ambulatory Visit (INDEPENDENT_AMBULATORY_CARE_PROVIDER_SITE_OTHER): Payer: Medicare Other | Admitting: Family Medicine

## 2018-01-01 ENCOUNTER — Encounter: Payer: Self-pay | Admitting: Family Medicine

## 2018-01-01 VITALS — BP 124/76 | HR 67 | Temp 97.8°F | Ht 73.0 in | Wt 235.4 lb

## 2018-01-01 DIAGNOSIS — D891 Cryoglobulinemia: Secondary | ICD-10-CM | POA: Diagnosis not present

## 2018-01-01 DIAGNOSIS — D591 Other autoimmune hemolytic anemias: Secondary | ICD-10-CM | POA: Diagnosis not present

## 2018-01-01 DIAGNOSIS — Z23 Encounter for immunization: Secondary | ICD-10-CM

## 2018-01-01 DIAGNOSIS — K7581 Nonalcoholic steatohepatitis (NASH): Secondary | ICD-10-CM | POA: Diagnosis not present

## 2018-01-01 DIAGNOSIS — Z7901 Long term (current) use of anticoagulants: Secondary | ICD-10-CM

## 2018-01-01 DIAGNOSIS — I480 Paroxysmal atrial fibrillation: Secondary | ICD-10-CM

## 2018-01-01 DIAGNOSIS — E782 Mixed hyperlipidemia: Secondary | ICD-10-CM | POA: Diagnosis not present

## 2018-01-01 DIAGNOSIS — G4733 Obstructive sleep apnea (adult) (pediatric): Secondary | ICD-10-CM

## 2018-01-01 DIAGNOSIS — Z79899 Other long term (current) drug therapy: Secondary | ICD-10-CM

## 2018-01-01 DIAGNOSIS — I10 Essential (primary) hypertension: Secondary | ICD-10-CM

## 2018-01-01 DIAGNOSIS — D5912 Cold autoimmune hemolytic anemia: Secondary | ICD-10-CM

## 2018-01-01 DIAGNOSIS — J449 Chronic obstructive pulmonary disease, unspecified: Secondary | ICD-10-CM

## 2018-01-01 DIAGNOSIS — E119 Type 2 diabetes mellitus without complications: Secondary | ICD-10-CM | POA: Diagnosis not present

## 2018-01-01 LAB — LIPID PANEL
Cholesterol: 132 mg/dL (ref 0–200)
HDL: 39.5 mg/dL (ref >39.00)
LDL Cholesterol: 60 mg/dL (ref 0–99)
NonHDL: 92.88
Total CHOL/HDL Ratio: 3
Triglycerides: 162 mg/dL — ABNORMAL HIGH (ref 0.0–149.0)
VLDL: 32.4 mg/dL (ref 0.0–40.0)

## 2018-01-01 LAB — COMPREHENSIVE METABOLIC PANEL
ALT: 27 U/L (ref 0–53)
AST: 17 U/L (ref 0–37)
Albumin: 4.3 g/dL (ref 3.5–5.2)
Alkaline Phosphatase: 72 U/L (ref 39–117)
BUN: 18 mg/dL (ref 6–23)
CO2: 30 mEq/L (ref 19–32)
Calcium: 9.3 mg/dL (ref 8.4–10.5)
Chloride: 101 mEq/L (ref 96–112)
Creatinine, Ser: 0.87 mg/dL (ref 0.40–1.50)
GFR: 91.97 mL/min (ref >60.00)
Glucose, Bld: 102 mg/dL — ABNORMAL HIGH (ref 70–99)
Potassium: 3.6 mEq/L (ref 3.5–5.1)
Sodium: 139 mEq/L (ref 135–145)
Total Bilirubin: 1.6 mg/dL — ABNORMAL HIGH (ref 0.2–1.2)
Total Protein: 6.9 g/dL (ref 6.0–8.3)

## 2018-01-01 LAB — HEMOGLOBIN A1C: Hgb A1c MFr Bld: 5.5 % (ref 4.6–6.5)

## 2018-01-01 LAB — VITAMIN B12: Vitamin B-12: 394 pg/mL (ref 211–911)

## 2018-01-01 NOTE — Progress Notes (Signed)
Steven Shepard is a 72 y.o. male is here for follow up.  History of Present Illness:   HPI: See Assessment and Plan section for Problem Based Charting of issues discussed today.   Health Maintenance Due  Topic Date Due  . TETANUS/TDAP  11/21/2015  . PNA vac Low Risk Adult (2 of 2 - PPSV23) 11/14/2016   Depression screen [facility] 2/9 01/01/2018 12/25/2016 04/26/2016  Decreased Interest 0 0 0  Down, Depressed, Hopeless 0 0 0  PHQ - 2 Score 0 0 0   PMHx, SurgHx, SocialHx, FamHx, Medications, and Allergies were reviewed in the Visit Navigator and updated as appropriate.   Patient Active Problem List   Diagnosis Date Noted  . Umbilical hernia   . OSA on CPAP   . Hypertension   . History of neck surgery   . DOE (dyspnea on exertion)   . COPD (chronic obstructive pulmonary disease) ([place])   . Atrial fibrillation ([place])   . Gallstone 04/26/2016  . NASH (nonalcoholic steatohepatitis) 04/26/2016  . Obesity (BMI 30.0-34.9) 04/26/2016  . Osteoarthrosis involving lower leg 05/21/2015  . Acute diastolic heart failure ([place]) 02/16/2015  . Benign non-nodular prostatic hyperplasia 01/11/2014  . Lower extremity edema 08/24/2013  . Cryoglobulinemia ([place]) 01/09/2013  . Systolic murmur of aorta 83/38/2505  . Elevated prostate specific antigen (PSA) 01/10/2012  . 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[place]) 01/23/2011  . Anemia, hemolytic ([place]) 01/22/2011  . Edema 12/26/2010  . Long term (current) use of anticoagulants 06/20/2010  . PAF (paroxysmal atrial fibrillation) ([place]) 06/09/2010  . Atherosclerosis of native arteries of extremity with rest pain ([place]) 02/14/2010  . Spinal stenosis of lumbar region with neurogenic claudication 02/14/2010  . Thoracic or lumbosacral neuritis or radiculitis, unspecified 01/10/2010  . Chronic obstructive pulmonary disease ([place]) 08/10/2009  . Obstructive sleep apnea syndrome 03/02/2008  . Hyperlipidemia 03/01/2008  . Gastroesophageal reflux disease with esophagitis  11/27/2007   Social History   Tobacco Use  . Smoking status: Former Smoker    Packs/day: 1.50    Years: 3.00    Pack years: 4.50    Types: Cigarettes, Pipe    Last attempt to quit: 03/12/1973    Years since quitting: 44.8  . Smokeless tobacco: Never Used  . Tobacco comment: quit appox 36 years ago  Substance Use Topics  . Alcohol use: Yes    Comment: occasionally, 1-7 drinks per week  . Drug use: No   Current Medications and Allergies:   .  acetaminophen (TYLENOL) 650 MG CR tablet, Take 650 mg by mouth 2 (two) times daily as needed for pain. , Disp: , Rfl:  .  atorvastatin (LIPITOR) 20 MG tablet, TAKE 1 TABLET BY MOUTH DAILY, Disp: 90 tablet, Rfl: 1 .  CARTIA XT 180 MG 24 hr capsule, TAKE 2 CAPSULES BY MOUTH EVERY DAY, Disp: 180 capsule, Rfl: 3 .  cloNIDine (CATAPRES) 0.3 MG tablet, TAKE 1 TABLET BY MOUTH TWICE DAILY, Disp: 180 tablet, Rfl: 1 .  Fexofenadine HCl (ALLEGRA PO), Take 1 tablet by mouth every morning. , Disp: , Rfl:  .  folic acid (FOLVITE) 397 MCG tablet, Take 400 mcg by mouth 2 (two) times daily., Disp: , Rfl:  .  furosemide (LASIX) 40 MG tablet, Take 1 tablet by mouth twice daily at 8 AM and  2PM. Please keep upcoming appt. Thank you, Disp: 180 tablet, Rfl: 0 .  metFORMIN (GLUCOPHAGE) 1000 MG tablet, Take 1 tablet (1,000 mg total) by mouth 2 (two) times daily with a meal., Disp: 180  tablet, Rfl: 3 .  metoprolol succinate (TOPROL-XL) 50 MG 24 hr tablet, TAKE 1 TABLET BY MOUTH TWICE DAILY, Disp: 180 tablet, Rfl: 1 .  Multiple Vitamin (MULTIVITAMIN) capsule, Take 1 capsule by mouth daily.  , Disp: , Rfl:  .  Multiple Vitamins-Minerals (EMERGEN-C IMMUNE PO), Take 1 tablet by mouth daily. , Disp: , Rfl:  .  Omega-3 Fatty Acids (FISH OIL) 1000 MG CAPS, Take 1 capsule by mouth daily. , Disp: , Rfl:  .  potassium chloride SA (K-DUR,KLOR-CON) 20 MEQ tablet, TAKE 1 TABLET BY MOUTH TWICE DAILY, Disp: 180 tablet, Rfl: 1 .  Psyllium (METAMUCIL FIBER PO), Take 1 Dose by mouth daily.  , Disp: , Rfl:  .  ranitidine (ZANTAC) 75 MG tablet, Take 75 mg by mouth daily as needed for heartburn. , Disp: , Rfl:  .  tamsulosin (FLOMAX) 0.4 MG CAPS capsule, TAKE ONE CAPSULE BY MOUTH EVERY DAY, Disp: 90 capsule, Rfl: 1 .  warfarin (COUMADIN) 5 MG tablet, TAKE BY MOUTH AS DIRECTED BY COUMADIN CLINIC, Disp: 30 tablet, Rfl: 3   Allergies  Allergen Reactions  . Ace Inhibitors Other (See Comments)    Other reaction(s): Other (See Comments) High K+  . Codeine     Other reaction(s): Other (See Comments) Hallucinations  . Spironolactone     Increases potassium   Review of Systems   Pertinent items are noted in the HPI. Otherwise, ROS is negative.  Vitals:   Vitals:   01/01/18 1051  BP: 124/76  Pulse: 67  Temp: 97.8 F (36.6 C)  TempSrc: Oral  SpO2: 96%  Weight: 235 lb 6.4 oz (106.8 kg)  Height: 6' 1"  (1.854 m)     Body mass index is 31.06 kg/m.  Physical Exam:   Physical Exam  Constitutional: He is oriented to person, place, and time. He appears well-developed and well-nourished. No distress.  HENT:  Head: Normocephalic and atraumatic.  Right Ear: External ear normal.  Left Ear: External ear normal.  Nose: Nose normal.  Mouth/Throat: Oropharynx is clear and moist.  Eyes: Pupils are equal, round, and reactive to light. Conjunctivae and EOM are normal.  Neck: Normal range of motion. Neck supple.  Cardiovascular: Normal rate, regular rhythm, normal heart sounds and intact distal pulses.  Pulmonary/Chest: Effort normal and breath sounds normal.  Abdominal: Soft. Bowel sounds are normal.  Musculoskeletal: Normal range of motion.  Neurological: He is alert and oriented to person, place, and time.  Skin: Skin is warm and dry.  Psychiatric: He has a normal mood and affect. His behavior is normal. Judgment and thought content normal.  Nursing note and vitals reviewed.  Diabetic Foot Exam - Simple   Simple Foot Form Diabetic Foot exam was performed with the following  findings:  Yes 01/01/2018 10:58 AM  Visual Inspection No deformities, no ulcerations, no other skin breakdown bilaterally:  Yes Sensation Testing Intact to touch and monofilament testing bilaterally:  Yes Pulse Check Posterior Tibialis and Dorsalis pulse intact bilaterally:  Yes Comments    Assessment and Plan:   Diagnoses and all orders for this visit:  PAF (paroxysmal atrial fibrillation) ([place])  Essential hypertension  Obstructive sleep apnea syndrome  Chronic obstructive pulmonary disease, unspecified COPD type ([place])  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[place]) -     CBC with Differential/Platelet  Medication management -     Vitamin B12  Type 2 diabetes mellitus without complication, without long-term current use of insulin (H[facility]) -     CBC with Differential/Platelet -  Hemoglobin A1c  Mixed hyperlipidemia -     Comprehensive metabolic panel -     Lipid panel  Encounter for immunization -     Flu vaccine HIGH DOSE PF   PAF (paroxysmal atrial fibrillation) ([facility]) On chronic Coumadin, with goal 2-3. Followed by Cardiology.   Chronic obstructive pulmonary disease ([facility]) Remains on Allegra as needed for allergies. No SOB or DOE, no persistent cough.   Obstructive sleep apnea syndrome, on CPAP Compliant with CPAP.  NASH (nonalcoholic steatohepatitis) Lab Results  Component Value Date   ALT 27 01/01/2018   AST 17 01/01/2018   ALKPHOS 72 01/01/2018   BILITOT 1.6 (H) 01/01/2018   2018 [place]: Increased hepatic echotexture compatible with fatty infiltration. No definite cirrhotic changes. Solitary gallstone without sonographic evidence of acute cholecystitis. Cystic structure adjacent to the right lobe of the liver or in a subcapsular location within the liver.  Cryoglobulinemia ([place]) Will need to send patient to [place] in the future for CBC. Although protocol followed, lab unable to be completed.   Long term (current) use of anticoagulants Followed by Coumadin Clinic.     Lab Results  Component Value Date   INR 2.7 12/25/2017   INR 2.6 10/30/2017   INR 2.4 09/25/2017    . Reviewed expectations re: course of current medical issues. . Discussed self-management of symptoms. . Outlined signs and symptoms indicating need for more acute intervention. . Patient verbalized understanding and all questions were answered. Marland Kitchen Health Maintenance issues including appropriate healthy diet, exercise, and smoking avoidance were discussed with patient. . See orders for this visit as documented in the electronic medical record. . Patient received an After Visit Summary.   Briscoe Deutscher, DO [place], [facility] 01/05/2018

## 2018-01-02 ENCOUNTER — Ambulatory Visit: Payer: Medicare Other | Admitting: Family Medicine

## 2018-01-04 ENCOUNTER — Other Ambulatory Visit: Payer: Self-pay | Admitting: Cardiology

## 2018-01-04 DIAGNOSIS — I5031 Acute diastolic (congestive) heart failure: Secondary | ICD-10-CM

## 2018-01-04 DIAGNOSIS — I480 Paroxysmal atrial fibrillation: Secondary | ICD-10-CM

## 2018-01-05 NOTE — Assessment & Plan Note (Signed)
Compliant with CPAP 

## 2018-01-05 NOTE — Assessment & Plan Note (Signed)
Followed by Coumadin Clinic.   Lab Results  Component Value Date   INR 2.7 12/25/2017   INR 2.6 10/30/2017   INR 2.4 09/25/2017

## 2018-01-05 NOTE — Assessment & Plan Note (Signed)
Lab Results  Component Value Date   ALT 27 01/01/2018   AST 17 01/01/2018   ALKPHOS 72 01/01/2018   BILITOT 1.6 (H) 01/01/2018   2018 [place]: Increased hepatic echotexture compatible with fatty infiltration. No definite cirrhotic changes. Solitary gallstone without sonographic evidence of acute cholecystitis. Cystic structure adjacent to the right lobe of the liver or in a subcapsular location within the liver.

## 2018-01-05 NOTE — Assessment & Plan Note (Signed)
Remains on Allegra as needed for allergies. No SOB or DOE, no persistent cough.

## 2018-01-05 NOTE — Assessment & Plan Note (Signed)
On chronic Coumadin, with goal 2-3. Followed by Cardiology.

## 2018-01-05 NOTE — Assessment & Plan Note (Signed)
Will need to send patient to [place] in the future for CBC. Although protocol followed, lab unable to be completed.

## 2018-01-08 ENCOUNTER — Ambulatory Visit (INDEPENDENT_AMBULATORY_CARE_PROVIDER_SITE_OTHER): Payer: Medicare Other

## 2018-01-08 VITALS — BP 128/72 | HR 59 | Temp 97.7°F | Ht 73.0 in | Wt 239.4 lb

## 2018-01-08 DIAGNOSIS — Z0000 Encounter for general adult medical examination without abnormal findings: Secondary | ICD-10-CM

## 2018-01-08 NOTE — Progress Notes (Signed)
PCP notes: Last OV 01/01/2018   Health maintenance:Tdap-update at the Pharmacy, Pneumovax 23   Abnormal screenings: None   Patient concerns: None   Nurse concerns:None   Next PCP appt: Schedule as Needed

## 2018-01-08 NOTE — Progress Notes (Signed)
Subjective:   Steven Shepard is a 72 y.o. male who presents for Medicare Annual/Subsequent preventive examination.  Review of Systems:  No ROS.  Medicare Wellness Visit. Additional risk factors are reflected in the social history. Cardiac Risk Factors include: advanced age (>72mn, >>65women);male gender Patient lives with wife of 559years. They live in a single story home. They have a golden retriever Ellie May. They have 2 sons. 1 [place] Patient enjoys golf, reading, playing with his dog, traveling. [place]  Patient goes to bed around 10-11pm. Sleeps through the night. Uses a CPAP. Gets up around 6:30am. Feels rested when he wakes up. Does take naps throughout the day as needed    Objective:    Vitals: BP 128/72 (BP Location: Left Arm, Patient Position: Sitting, Cuff Size: Large)   Pulse (!) 59   Temp 97.7 F (36.5 C) (Oral)   Ht 6' 1"  (1.854 m)   Wt 239 lb 6.4 oz (108.6 kg)   SpO2 94%   BMI 31.59 kg/m   Body mass index is 31.59 kg/m.  Advanced Directives 01/08/2018 12/25/2016 06/10/2013  Does Patient Have a Medical Advance Directive? Yes Yes Patient has advance directive, copy not in chart  Type of Advance Directive [place] will [place] will [place] will  Does patient want to make changes to medical advance directive? No - Patient declined No - Patient declined No change requested  Copy of [place] Chart? No - copy requested No - copy requested -    Tobacco Social History   Tobacco Use  Smoking Status Former Smoker  . Packs/day: 1.50  . Years: 3.00  . Pack years: 4.50  . Types: Cigarettes, Pipe  . Last attempt to quit: 03/12/1973  . Years since quitting: 44.8  Smokeless Tobacco Never Used  Tobacco Comment   quit appox 36 years ago     Counseling given: Not Answered Comment: quit appox 36 years  ago   Past Medical History:  Diagnosis Date  . Anemia, hemolytic ([place] 01/22/2011  . Aortic stenosis   . 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[place] 01/23/2011  . COPD (chronic obstructive pulmonary disease) ([place]   . DOE (dyspnea on exertion)   . Elevated bilirubin   . Fatty liver   . History of neck surgery   . Hyperlipidemia   . Hypertension   . OSA on CPAP   . PAF (paroxysmal atrial fibrillation) ([place]   . Pre-diabetes   . Umbilical hernia    Past Surgical History:  Procedure Laterality Date  . ADENOIDECTOMY    . HIATAL HERNIA REPAIR  2011  . NECK SURGERY  2005  . SKIN SURGERY     skin cancer  . TONSILLECTOMY    . TOTAL HIP ARTHROPLASTY Right 09/18/2010   Dr. AMaureen Ralphs  Family History  Problem Relation Age of Onset  . Coronary artery disease Mother   . Hypertension Mother   . Heart attack Mother   . Coronary artery disease Father   . Hypertension Father   . Heart attack Father   . Diabetes Sister   . Hypertension Sister   . Diabetes Brother   . Cancer Brother   . Colon cancer Neg Hx   . Colonic polyp Neg Hx    Social History   Socioeconomic History  . Marital status: Married    Spouse name: Not on file  .  Number of children: Not on file  . Years of education: Not on file  . Highest education level: Not on file  Occupational History  . Occupation: Architectural technologist: [employer]  Social Needs  . Financial resource strain: Not on file  . Food insecurity:    Worry: Not on file    Inability: Not on file  . Transportation needs:    Medical: Not on file    Non-medical: Not on file  Tobacco Use  . Smoking status: Former Smoker    Packs/day: 1.50    Years: 3.00    Pack years: 4.50    Types: Cigarettes, Pipe    Last attempt to quit: 03/12/1973    Years since quitting: 44.8  . Smokeless tobacco: Never Used  . Tobacco comment: quit appox 36 years ago  Substance and Sexual Activity  . Alcohol use: Yes    Comment: occasionally, 1-7 drinks per week  . Drug use: No   . Sexual activity: Yes  Lifestyle  . Physical activity:    Days per week: Not on file    Minutes per session: Not on file  . Stress: Not on file  Relationships  . Social connections:    Talks on phone: Not on file    Gets together: Not on file    Attends religious service: Not on file    Active member of club or organization: Not on file    Attends meetings of clubs or organizations: Not on file    Relationship status: Not on file  Other Topics Concern  . Not on file  Social History Narrative  . Not on file    Outpatient Encounter Medications as of 01/08/2018  Medication Sig  . acetaminophen (TYLENOL) 650 MG CR tablet Take 650 mg by mouth 2 (two) times daily as needed for pain.   Marland Kitchen atorvastatin (LIPITOR) 20 MG tablet TAKE 1 TABLET BY MOUTH DAILY  . CARTIA XT 180 MG 24 hr capsule TAKE 2 CAPSULES BY MOUTH EVERY DAY  . cloNIDine (CATAPRES) 0.3 MG tablet TAKE 1 TABLET BY MOUTH TWICE DAILY  . Fexofenadine HCl (ALLEGRA PO) Take 1 tablet by mouth every morning.   . folic acid (FOLVITE) 829 MCG tablet Take 400 mcg by mouth 2 (two) times daily.  . furosemide (LASIX) 40 MG tablet TAKE 1 TABLET BY MOUTH TWICE DAILY( 1 TABLET AT 8 AM AND 1 TABLET AT 2 PM).  . metFORMIN (GLUCOPHAGE) 1000 MG tablet Take 1 tablet (1,000 mg total) by mouth 2 (two) times daily with a meal.  . metoprolol succinate (TOPROL-XL) 50 MG 24 hr tablet TAKE 1 TABLET BY MOUTH TWICE DAILY  . Multiple Vitamin (MULTIVITAMIN) capsule Take 1 capsule by mouth daily.    . Multiple Vitamins-Minerals (EMERGEN-C IMMUNE PO) Take 1 tablet by mouth daily.   . Omega-3 Fatty Acids (FISH OIL) 1000 MG CAPS Take 1 capsule by mouth daily.   . potassium chloride SA (K-DUR,KLOR-CON) 20 MEQ tablet TAKE 1 TABLET BY MOUTH TWICE DAILY  . Psyllium (METAMUCIL FIBER PO) Take 1 Dose by mouth daily.   . ranitidine (ZANTAC) 75 MG tablet Take 75 mg by mouth daily as needed for heartburn.   . tamsulosin (FLOMAX) 0.4 MG CAPS capsule TAKE ONE CAPSULE BY  MOUTH EVERY DAY  . warfarin (COUMADIN) 5 MG tablet TAKE BY MOUTH AS DIRECTED BY COUMADIN CLINIC   No facility-administered encounter medications on file as of 01/08/2018.     Activities of Daily Living In  your present state of health, do you have any difficulty performing the following activities: 01/08/2018  Hearing? N  Vision? N  Difficulty concentrating or making decisions? N  Walking or climbing stairs? N  Dressing or bathing? N  Doing errands, shopping? N  Preparing Food and eating ? N  Using the Toilet? N  In the past six months, have you accidently leaked urine? N  Do you have problems with loss of bowel control? N  Managing your Medications? N  Managing your Finances? N  Housekeeping or managing your Housekeeping? N  Some recent data might be hidden    Patient Care Team: Briscoe Deutscher, DO as PCP - General (Family Medicine) Dorothy Spark, MD as PCP - Cardiology (Cardiology) Sharyne Peach, MD as Consulting Physician (Ophthalmology) Dorothy Spark, MD as Consulting Physician (Cardiology) Jovita Gamma, MD as Consulting Physician (Neurosurgery) Laurence Spates, MD as Consulting Physician (Gastroenterology)   Assessment:   This is a routine wellness examination for [facility].  Exercise Activities and Dietary recommendations Current Exercise Habits: Home exercise routine, Type of exercise: walking, Time (Minutes): 30, Frequency (Times/Week): 7, Weekly Exercise (Minutes/Week): 210, Intensity: Mild, Exercise limited by: None identified   Breakfast: Yogurt, granola, and fruit, decaff coffee or Orange Juice. Occasionally bacon, eggs, and pancakes  Lunch:Sandwich or salad, drinks unsweet ice tea  Dinner: Normally goes out to eat for dinner with wife, [place], [place]  Not a huge snack person Goals    . Reduce salt intake to 2 grams per day or less    . Weight (lb) < 200 lb (90.7 kg)     Would like to lose weight to get to around 220lbs        Fall Risk Fall Risk  01/08/2018 01/01/2018 12/25/2016 04/26/2016 04/26/2016  Falls in the past year? No No No No No    Depression Screen PHQ 2/9 Scores 01/08/2018 01/01/2018 12/25/2016 04/26/2016  PHQ - 2 Score 0 0 0 0    Cognitive Function     6CIT Screen 01/08/2018  What Year? 0 points  What month? 0 points  What time? 0 points  Count back from 20 0 points  Months in reverse 0 points  Repeat phrase 0 points  Total Score 0    Immunization History  Administered Date(s) Administered  . Influenza Split 12/11/2011, 12/10/2012  . Influenza, High Dose Seasonal PF 01/09/2012, 01/09/2013, 01/11/2014, 12/14/2015, 01/01/2018  . Influenza-Unspecified 01/09/2012, 01/09/2013, 12/10/2013, 01/11/2014, 12/14/2015, 11/25/2016  . Pneumococcal Conjugate-13 11/15/2015  . Pneumococcal Polysaccharide-23 09/09/2009, 06/07/2010  . Tdap 11/20/2005  . Zoster 03/12/2010      Screening Tests Health Maintenance  Topic Date Due  . TETANUS/TDAP  11/21/2015  . PNA vac Low Risk Adult (2 of 2 - PPSV23) 11/14/2016  . HEMOGLOBIN A1C  07/03/2018  . OPHTHALMOLOGY EXAM  07/24/2018  . FOOT EXAM  01/02/2019  . COLONOSCOPY  03/12/2021  . INFLUENZA VACCINE  Completed  . Hepatitis C Screening  Completed         Plan:   Follow Up with PCP as advised  I have personally reviewed and noted the following in the patient's chart:   . Medical and social history . Use of alcohol, tobacco or illicit drugs  . Current medications and supplements . Functional ability and status . Nutritional status . Physical activity . Advanced directives . List of other physicians . Vitals . Screenings to include cognitive, depression, and falls . Referrals and appointments  In addition, I have reviewed and discussed  with patient certain preventive protocols, quality metrics, and best practice recommendations. A written personalized care plan for preventive services as well as general preventive health  recommendations were provided to patient.     Kelliher, LPN  [phone]

## 2018-01-08 NOTE — Patient Instructions (Addendum)
Steven Shepard , Thank you for taking time to come for your Medicare Wellness Visit. I appreciate your ongoing commitment to your health goals. Please review the following plan we discussed and let me know if I can assist you in the future.   These are the goals we discussed: Goals    . Reduce salt intake to 2 grams per day or less    . Weight (lb) < 200 lb (90.7 kg)     Would like to lose weight to get to around 220lbs       This is a list of the screening recommended for you and due dates:  Health Maintenance  Topic Date Due  . Tetanus Vaccine  11/21/2015  . Pneumonia vaccines (2 of 2 - PPSV23) 11/14/2016  . Hemoglobin A1C  07/03/2018  . Eye exam for diabetics  07/24/2018  . Complete foot exam   01/02/2019  . Colon Cancer Screening  03/12/2021  . Flu Shot  Completed  .  Hepatitis C: One time screening is recommended by Center for Disease Control  (CDC) for  adults born from [birth year] through 1964.   Completed   Preventive Care for Adults  A healthy lifestyle and preventive care can promote health and wellness. Preventive health guidelines for adults include the following key practices.  . A routine yearly physical is a good way to check with your health care provider about your health and preventive screening. It is a chance to share any concerns and updates on your health and to receive a thorough exam.  . Visit your dentist for a routine exam and preventive care every 6 months. Brush your teeth twice a day and floss once a day. Good oral hygiene prevents tooth decay and gum disease.  . The frequency of eye exams is based on your age, health, family medical history, use  of contact lenses, and other factors. Follow your health care provider's recommendations for frequency of eye exams.  . Eat a healthy diet. Foods like vegetables, fruits, whole grains, low-fat dairy products, and lean protein foods contain the nutrients you need without too many calories. Decrease your intake of  foods high in solid fats, added sugars, and salt. Eat the right amount of calories for you. Get information about a proper diet from your health care provider, if necessary.  . Regular physical exercise is one of the most important things you can do for your health. Most adults should get at least 150 minutes of moderate-intensity exercise (any activity that increases your heart rate and causes you to sweat) each week. In addition, most adults need muscle-strengthening exercises on 2 or more days a week.  Silver Sneakers may be a benefit available to you. To determine eligibility, you may visit the website: www.silversneakers.com or contact program at (434) 476-1355 Mon-Fri between 8AM-8PM.   . Maintain a healthy weight. The body mass index (BMI) is a screening tool to identify possible weight problems. It provides an estimate of body fat based on height and weight. Your health care provider can find your BMI and can help you achieve or maintain a healthy weight.   For adults 20 years and older: ? A BMI below 18.5 is considered underweight. ? A BMI of 18.5 to 24.9 is normal. ? A BMI of 25 to 29.9 is considered overweight. ? A BMI of 30 and above is considered obese.   . Maintain normal blood lipids and cholesterol levels by exercising and minimizing your intake of saturated fat. Eat a  balanced diet with plenty of fruit and vegetables. Blood tests for lipids and cholesterol should begin at age 85 and be repeated every 5 years. If your lipid or cholesterol levels are high, you are over 50, or you are at high risk for heart disease, you may need your cholesterol levels checked more frequently. Ongoing high lipid and cholesterol levels should be treated with medicines if diet and exercise are not working.  . If you smoke, find out from your health care provider how to quit. If you do not use tobacco, please do not start.  . If you choose to drink alcohol, please do not consume more than 2 drinks per  day. One drink is considered to be 12 ounces (355 mL) of beer, 5 ounces (148 mL) of wine, or 1.5 ounces (44 mL) of liquor.  . If you are 80-39 years old, ask your health care provider if you should take aspirin to prevent strokes.  . Use sunscreen. Apply sunscreen liberally and repeatedly throughout the day. You should seek shade when your shadow is shorter than you. Protect yourself by wearing long sleeves, pants, a wide-brimmed hat, and sunglasses year round, whenever you are outdoors.  . Once a month, do a whole body skin exam, using a mirror to look at the skin on your back. Tell your health care provider of new moles, moles that have irregular borders, moles that are larger than a pencil eraser, or moles that have changed in shape or color.

## 2018-01-08 NOTE — Progress Notes (Signed)
I have reviewed documentation for AWV and Advance Care planning provided by [facility], I agree with documentation, I was immediately available for any questions. Briscoe Deutscher, DO

## 2018-01-11 ENCOUNTER — Encounter: Payer: Self-pay | Admitting: Family Medicine

## 2018-01-11 DIAGNOSIS — Z23 Encounter for immunization: Secondary | ICD-10-CM | POA: Diagnosis not present

## 2018-01-13 ENCOUNTER — Other Ambulatory Visit: Payer: Self-pay | Admitting: Surgical

## 2018-01-20 ENCOUNTER — Encounter: Payer: Self-pay | Admitting: Family Medicine

## 2018-01-27 ENCOUNTER — Other Ambulatory Visit: Payer: Self-pay

## 2018-01-29 ENCOUNTER — Ambulatory Visit (INDEPENDENT_AMBULATORY_CARE_PROVIDER_SITE_OTHER): Payer: Medicare Other | Admitting: General Practice

## 2018-01-29 DIAGNOSIS — Z7901 Long term (current) use of anticoagulants: Secondary | ICD-10-CM | POA: Diagnosis not present

## 2018-01-29 LAB — POCT INR: INR: 2.4 (ref 2.0–3.0)

## 2018-01-29 NOTE — Progress Notes (Signed)
I have reviewed this visit and I agree on the patient's plan of dosage and recommendations. Briscoe Deutscher, DO

## 2018-01-29 NOTE — Patient Instructions (Addendum)
Pre visit review using our clinic review tool, if applicable. No additional management support is needed unless otherwise documented below in the visit note.  Continue to take 1/2 tablet daily except take 1 tablet on Monday/Wed/Fridays and recheck in 6 weeks.

## 2018-01-31 DIAGNOSIS — L570 Actinic keratosis: Secondary | ICD-10-CM | POA: Diagnosis not present

## 2018-01-31 DIAGNOSIS — L821 Other seborrheic keratosis: Secondary | ICD-10-CM | POA: Diagnosis not present

## 2018-01-31 DIAGNOSIS — D1801 Hemangioma of skin and subcutaneous tissue: Secondary | ICD-10-CM | POA: Diagnosis not present

## 2018-01-31 DIAGNOSIS — D485 Neoplasm of uncertain behavior of skin: Secondary | ICD-10-CM | POA: Diagnosis not present

## 2018-01-31 DIAGNOSIS — D0359 Melanoma in situ of other part of trunk: Secondary | ICD-10-CM | POA: Diagnosis not present

## 2018-01-31 DIAGNOSIS — Z85828 Personal history of other malignant neoplasm of skin: Secondary | ICD-10-CM | POA: Diagnosis not present

## 2018-01-31 DIAGNOSIS — D225 Melanocytic nevi of trunk: Secondary | ICD-10-CM | POA: Diagnosis not present

## 2018-02-04 ENCOUNTER — Other Ambulatory Visit: Payer: Self-pay | Admitting: Cardiology

## 2018-02-18 DIAGNOSIS — Z85828 Personal history of other malignant neoplasm of skin: Secondary | ICD-10-CM | POA: Diagnosis not present

## 2018-02-18 DIAGNOSIS — D0359 Melanoma in situ of other part of trunk: Secondary | ICD-10-CM | POA: Diagnosis not present

## 2018-02-21 ENCOUNTER — Other Ambulatory Visit: Payer: Self-pay | Admitting: Family Medicine

## 2018-03-04 ENCOUNTER — Other Ambulatory Visit: Payer: Self-pay | Admitting: Family Medicine

## 2018-03-04 ENCOUNTER — Other Ambulatory Visit: Payer: Self-pay | Admitting: Cardiology

## 2018-03-04 DIAGNOSIS — R7303 Prediabetes: Secondary | ICD-10-CM

## 2018-03-06 DIAGNOSIS — Z4802 Encounter for removal of sutures: Secondary | ICD-10-CM | POA: Diagnosis not present

## 2018-03-19 ENCOUNTER — Ambulatory Visit (INDEPENDENT_AMBULATORY_CARE_PROVIDER_SITE_OTHER): Payer: Medicare Other | Admitting: General Practice

## 2018-03-19 DIAGNOSIS — Z7901 Long term (current) use of anticoagulants: Secondary | ICD-10-CM

## 2018-03-19 LAB — POCT INR: INR: 2.4 (ref 2.0–3.0)

## 2018-03-19 NOTE — Patient Instructions (Addendum)
Pre visit review using our clinic review tool, if applicable. No additional management support is needed unless otherwise documented below in the visit note.  Continue to take 1/2 tablet daily except take 1 tablet on Monday/Wed/Fridays and recheck in 6 weeks.

## 2018-03-20 NOTE — Progress Notes (Signed)
I have reviewed this visit and I agree on the patient's plan of dosage and recommendations. Briscoe Deutscher, DO

## 2018-03-28 ENCOUNTER — Other Ambulatory Visit: Payer: Self-pay | Admitting: Family Medicine

## 2018-04-11 ENCOUNTER — Other Ambulatory Visit: Payer: Self-pay | Admitting: Cardiology

## 2018-04-11 DIAGNOSIS — I110 Hypertensive heart disease with heart failure: Secondary | ICD-10-CM

## 2018-04-11 DIAGNOSIS — E7849 Other hyperlipidemia: Secondary | ICD-10-CM

## 2018-04-23 ENCOUNTER — Ambulatory Visit (INDEPENDENT_AMBULATORY_CARE_PROVIDER_SITE_OTHER): Payer: Medicare Other | Admitting: General Practice

## 2018-04-23 DIAGNOSIS — Z7901 Long term (current) use of anticoagulants: Secondary | ICD-10-CM

## 2018-04-23 LAB — POCT INR: INR: 2.7 (ref 2.0–3.0)

## 2018-04-23 NOTE — Progress Notes (Signed)
I have reviewed this visit and I agree on the patient's plan of dosage and recommendations. Briscoe Deutscher, DO

## 2018-04-23 NOTE — Patient Instructions (Addendum)
Pre visit review using our clinic review tool, if applicable. No additional management support is needed unless otherwise documented below in the visit note.  Continue to take 1/2 tablet daily except take 1 tablet on Monday/Wed/Fridays and recheck in 6 weeks.

## 2018-04-25 DIAGNOSIS — D1801 Hemangioma of skin and subcutaneous tissue: Secondary | ICD-10-CM | POA: Diagnosis not present

## 2018-04-25 DIAGNOSIS — D225 Melanocytic nevi of trunk: Secondary | ICD-10-CM | POA: Diagnosis not present

## 2018-04-25 DIAGNOSIS — Z85828 Personal history of other malignant neoplasm of skin: Secondary | ICD-10-CM | POA: Diagnosis not present

## 2018-04-25 DIAGNOSIS — D2261 Melanocytic nevi of right upper limb, including shoulder: Secondary | ICD-10-CM | POA: Diagnosis not present

## 2018-04-25 DIAGNOSIS — D2372 Other benign neoplasm of skin of left lower limb, including hip: Secondary | ICD-10-CM | POA: Diagnosis not present

## 2018-04-25 DIAGNOSIS — C44612 Basal cell carcinoma of skin of right upper limb, including shoulder: Secondary | ICD-10-CM | POA: Diagnosis not present

## 2018-04-25 DIAGNOSIS — D044 Carcinoma in situ of skin of scalp and neck: Secondary | ICD-10-CM | POA: Diagnosis not present

## 2018-04-25 DIAGNOSIS — L570 Actinic keratosis: Secondary | ICD-10-CM | POA: Diagnosis not present

## 2018-04-25 DIAGNOSIS — Z85820 Personal history of malignant melanoma of skin: Secondary | ICD-10-CM | POA: Diagnosis not present

## 2018-04-25 DIAGNOSIS — C44519 Basal cell carcinoma of skin of other part of trunk: Secondary | ICD-10-CM | POA: Diagnosis not present

## 2018-04-25 DIAGNOSIS — D485 Neoplasm of uncertain behavior of skin: Secondary | ICD-10-CM | POA: Diagnosis not present

## 2018-05-10 ENCOUNTER — Other Ambulatory Visit: Payer: Self-pay | Admitting: Cardiology

## 2018-05-12 DIAGNOSIS — C4442 Squamous cell carcinoma of skin of scalp and neck: Secondary | ICD-10-CM | POA: Diagnosis not present

## 2018-05-12 DIAGNOSIS — Z85828 Personal history of other malignant neoplasm of skin: Secondary | ICD-10-CM | POA: Diagnosis not present

## 2018-05-21 ENCOUNTER — Other Ambulatory Visit: Payer: Self-pay | Admitting: Family Medicine

## 2018-05-28 ENCOUNTER — Other Ambulatory Visit: Payer: Self-pay

## 2018-05-28 ENCOUNTER — Ambulatory Visit (INDEPENDENT_AMBULATORY_CARE_PROVIDER_SITE_OTHER): Payer: Medicare Other | Admitting: General Practice

## 2018-05-28 DIAGNOSIS — Z7901 Long term (current) use of anticoagulants: Secondary | ICD-10-CM

## 2018-05-28 LAB — POCT INR: INR: 2.5 (ref 2.0–3.0)

## 2018-05-28 NOTE — Progress Notes (Signed)
I have reviewed this visit and I agree on the patient's plan of dosage and recommendations. Briscoe Deutscher, DO

## 2018-05-28 NOTE — Patient Instructions (Addendum)
Pre visit review using our clinic review tool, if applicable. No additional management support is needed unless otherwise documented below in the visit note.  Continue to take 1/2 tablet daily except take 1 tablet on Monday/Wed/Fridays and recheck in 6 weeks.

## 2018-06-06 ENCOUNTER — Ambulatory Visit: Payer: Medicare Other | Admitting: Cardiology

## 2018-06-24 ENCOUNTER — Telehealth: Payer: Self-pay | Admitting: Physician Assistant

## 2018-06-24 NOTE — Telephone Encounter (Signed)
Patient set up for MyChart?  Active sent written consent for through mychart   Is patient using Smartphone/computer/tablet? smartphone  Did audio/video work?n/a to test with doximity  Does patient need telephone visit?no  Best phone number to use? [phone]  Special Instructions? Patient gave verbal consent for appointment 06/24/18 = also sent written consent through mychart.

## 2018-06-24 NOTE — Telephone Encounter (Signed)
    Chart Scrub Note  Steven Shepard is currently scheduled for in-person visit on 07/03/18 (6 month f/u). Due to the recent COVID-19 pandemic, we are transitioning in-person office visits to tele-medicine visits in an effort to decrease non-essential exposure to our patients, their families, and staff. This patient has been deemed a candidate to change their previously scheduled office visit to virtual visit.   Please call patient:  1. Inform them we will be conducting visit via tele-health, instead of coming to the office physically in person. 2. Please discuss doing telephone versus video visit. If the patient has a smartphone, video visit is preferred method. Note:  I will be using Doximity video dialer instead of Webex. There is no prep needed on the patient's side. This merely involves the patient receiving a text at the time of their visit to launch the video (on a smartphone). The workflow would be for my assist to call the patient on the phone 15 mins prior to visit to get vitals and meds, then I will send the patient a text via mobile phone to launch the video. If he decides to proceed with video visit, please let patient know the plan. 3. When contact is confirmed, please change appointment type to Virtual Office Visit and indicate appointment type in notes. 4. Can review consent at time of this phone call when contact is established or 2-3 days prior to visit (dotphrase is hcevisitprecall).  If the patient has any urgent concerns, please send me back any pertinent information and we will decide whether we need to transition them to the doctor-of-the-day's schedule in person.  Thank you!   Clio Gerhart PA-C

## 2018-07-01 ENCOUNTER — Encounter: Payer: Self-pay | Admitting: Physician Assistant

## 2018-07-01 NOTE — Progress Notes (Signed)
Virtual Visit via Video Note   This visit type was conducted due to national recommendations for restrictions regarding the COVID-19 Pandemic (e.g. social distancing) in an effort to limit this patient's exposure and mitigate transmission in our community.  Due to his co-morbid illnesses, this patient is at least at moderate risk for complications without adequate follow up.  This format is felt to be most appropriate for this patient at this time.  All issues noted in this document were discussed and addressed.  A limited physical exam was performed with this format.  Please refer to the patient's chart for his consent to telehealth for [facility].   Evaluation Performed:  Follow-up visit  Date:  07/03/2018   ID:  Steven Shepard, DOB 03-27-1945, MRN [id number]  Patient Location: Home Provider Location: Home  PCP:  Briscoe Deutscher, DO  Cardiologist:  Ena Dawley, MD  Electrophysiologist:  None   Chief Complaint:  6 mo f/u of atrial fib, chronic diastolic CHF, aortic stenosis  History of Present Illness:    Steven Shepard is a 73 y.o. male with PAF, HTN, HLD (followed by PCP), habitual alcohol intake, moderate aortic stenosis, mildly dilated ascending aorta, edema felt 2/2 chronic diastolic CHF, hemolytic anemia, pre-DM, cold agglutinin disease, COPD, OSA on CPAP who presents for 6 month f/u. He was remotely followed by Dr. Verl Blalock and more recently Dr. Meda Coffee. In 2011 he was admitted for acute kidney insufficiency and hyperkalemia >7.5 in setting of spironolactone and benazepril which were stopped. LE venous duplex 2012 was negative for DVT. Had normal ABIs 2015. He has not required prior ischemic evaluation. Prior RUQ [place] 2018 showed fatty infiltration without definite cirrhotic changes. He is followed by Dr. Oletta Lamas with GI. His cold agglutinin disease was discovered incidentally during pre-op bloodwork for hip surgery several years ago. Traditionally he has remained on Coumadin  instead of a NOAC because he was told we know for sure that Coumadin works in the setting of cold agglutinin disease. He has learned to limit ETOH intake as this had triggered events in the past. His last echo following that visit 11/2017 showed EF 60-65%, grade 1 DD, moderate aortic stenosis, mildly dilated ascending aorta. Last labs 12/2017 showed LDL 60, trig 162, K 3.6, Cr 0.87, Tbili 1.6 (persistently elevated), LFTs wnl, CBC did not result - last Hgb 03/2017 was 12.2.   He is seen today via video and feeling great. He continues to walk the dog regularly 2-3x a day without any complaints. He has rare breakthrough palpitations but nothing sustained. He wears an Apple watch. No bleeding noted. BP is well controlled today. He is limiting alcohol to 1 drink per day and no longer drinks excess. He has rare sockline edema with tighter elastic but no significant swelling. The patient does not have symptoms concerning for COVID-19 infection (fever, chills, cough, or new shortness of breath).   Past Medical History:  Diagnosis Date   Anemia, hemolytic ([place]) 01/22/2011   Aortic stenosis    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[place]) 01/23/2011   COPD (chronic obstructive pulmonary disease) (HCC)    DOE (dyspnea on exertion)    Elevated bilirubin    Fatty liver    History of neck surgery    Hyperlipidemia    Hypertension    Mild dilation of ascending aorta ([facility])    OSA on CPAP    PAF (paroxysmal atrial fibrillation) ([place])    Pre-diabetes    Umbilical hernia    Past Surgical History:  Procedure Laterality  Date   ADENOIDECTOMY     HIATAL HERNIA REPAIR  2011   NECK SURGERY  2005   SKIN SURGERY     skin cancer   TONSILLECTOMY     TOTAL HIP ARTHROPLASTY Right 09/18/2010   Dr. Maureen Ralphs     Current Meds  Medication Sig   acetaminophen (TYLENOL) 650 MG CR tablet Take 650 mg by mouth 2 (two) times daily as needed for pain.    atorvastatin (LIPITOR) 20 MG tablet TAKE 1 TABLET BY MOUTH DAILY    CARTIA XT 180 MG 24 hr capsule TAKE 2 CAPSULES BY MOUTH EVERY DAY   cloNIDine (CATAPRES) 0.3 MG tablet TAKE 1 TABLET BY MOUTH TWICE DAILY   Fexofenadine HCl (ALLEGRA PO) Take 1 tablet by mouth every morning.    folic acid (FOLVITE) 629 MCG tablet Take 400 mcg by mouth 2 (two) times daily.   furosemide (LASIX) 40 MG tablet TAKE 1 TABLET BY MOUTH TWICE DAILY( 1 TABLET AT 8 AM AND 1 TABLET AT 2 PM).   metFORMIN (GLUCOPHAGE) 1000 MG tablet TAKE 1 TABLET BY MOUTH TWICE DAILY WITH A MEAL   metoprolol succinate (TOPROL-XL) 50 MG 24 hr tablet TAKE 1 TABLET BY MOUTH TWICE DAILY   Multiple Vitamin (MULTIVITAMIN) capsule Take 1 capsule by mouth daily.     Multiple Vitamins-Minerals (EMERGEN-C IMMUNE PO) Take 1 tablet by mouth daily.    Omega-3 Fatty Acids (OMEGA 3 500) 500 MG CAPS Take 500 capsules by mouth daily.   potassium chloride SA (K-DUR,KLOR-CON) 20 MEQ tablet TAKE 1 TABLET BY MOUTH TWICE DAILY   Psyllium (METAMUCIL FIBER PO) Take 1 Dose by mouth daily.    tamsulosin (FLOMAX) 0.4 MG CAPS capsule TAKE ONE CAPSULE BY MOUTH EVERY DAY   warfarin (COUMADIN) 5 MG tablet Take 1/2 tablet daily except take 1 tablet on Mon Wed and Fri or TAKE AS DIRECTED BY COUMADIN CLINIC     Allergies:   Ace inhibitors; Codeine; and Spironolactone   Social History   Tobacco Use   Smoking status: Former Smoker    Packs/day: 1.50    Years: 3.00    Pack years: 4.50    Types: Cigarettes, Pipe    Last attempt to quit: 03/12/1973    Years since quitting: 45.3   Smokeless tobacco: Never Used   Tobacco comment: quit appox 36 years ago  Substance Use Topics   Alcohol use: Yes    Comment: occasionally, 1-7 drinks per week   Drug use: No     Family Hx: The patient's family history includes Cancer in his brother; Coronary artery disease in his father and mother; Diabetes in his brother and sister; Heart attack in his father and mother; Hypertension in his father, mother, and sister. There is no  history of Colon cancer or Colonic polyp.  ROS:   Please see the history of present illness.    All other systems reviewed and are negative.   Prior CV studies:   Pertinent studies outlined above.  Labs/Other Tests and Data Reviewed:    EKG:  Last EKG reviewed 11/12/2017 - demonstrates NSR 64bpm, first degree AVB, no acute ST-T changes  Recent Labs: 01/01/2018: ALT 27; BUN 18; Creatinine, Ser 0.87; Potassium 3.6; Sodium 139   Recent Lipid Panel Lab Results  Component Value Date/Time   CHOL 132 01/01/2018 11:17 AM   TRIG 162.0 (H) 01/01/2018 11:17 AM   HDL 39.50 01/01/2018 11:17 AM   CHOLHDL 3 01/01/2018 11:17 AM   LDLCALC 60 01/01/2018 11:17  AM    Wt Readings from Last 3 Encounters:  07/03/18 229 lb (103.9 kg)  01/08/18 239 lb 6.4 oz (108.6 kg)  01/01/18 235 lb 6.4 oz (106.8 kg)     Objective:    Vital Signs:  BP 113/66    Pulse 72    Ht 6' 1"  (1.854 m)    Wt 229 lb (103.9 kg)    BMI 30.21 kg/m    VITAL SIGNS:  reviewed GEN:  no acute distress EYES:  sclerae anicteric, EOMI - Extraocular Movements Intact RESPIRATORY:  normal respiratory effort, no labored breathing, no wheezing or coughing during visit CARDIOVASCULAR:  no peripheral edema MUSCULOSKELETAL:  moves all extremities spontaneously (panned camera to show me his dog) NEURO:  alert and oriented x 3, no obvious focal deficit PSYCH:  normal affect  ASSESSMENT & PLAN:    1. Paroxysmal atrial fibrillation - no breakthrough symptoms recently. He will continue to wear the Apple watch and report if any breakthrough symptoms or elevated HR noted. He will continue Coumadin, chosen in light of his cold aggluitin diagnosis as outlined above. His INR is monitored through PCP. He is due for a CBC as his last Hgb was drawn in 03/2017. This was drawn in 12/2017 by primary care but unable to be resulted so his PCP had planned to send him to the cancer center for next draw. I'll forward a message to Dr. Juleen China to review what the  plan/timing is of when to repeat this as an annual level is preferred - I am not quite sure how to order through the cancer center as she had mentioned. I do not know if perhaps getting it when he goes for his INR would be possible. He is not having any complaints of bleeding at this time. 2. Moderate aortic stenosis - no symptoms reported. He will notify us of any issues. He will have f/u echo 11/2018 anyway for #3. 3. Mildly dilated ascending aorta - discussed with pt. BP well controlled. He is on beta blocker. F/u echo at 1 year mark (11/2018). 4. Chronic diastolic CHF - appears euvolemic by symptoms and weight. No significant swelling seen by video visit today.  COVID-19 Education: The signs and symptoms of COVID-19 were discussed with the patient and how to seek care for testing (follow up with PCP or arrange E-visit). The importance of social distancing was discussed today.  Time:   Today, I have spent 19 minutes with the patient with telehealth technology discussing the above problems.     Medication Adjustments/Labs and Tests Ordered: Current medicines are reviewed at length with the patient today.  Concerns regarding medicines are outlined above.   Disposition:  Follow up in 6 month(s) with Dr. Meda Coffee.  Signed, Charlie Pitter, PA-C  07/03/2018 9:48 AM    [place]

## 2018-07-03 ENCOUNTER — Telehealth (INDEPENDENT_AMBULATORY_CARE_PROVIDER_SITE_OTHER): Payer: Medicare Other | Admitting: Physician Assistant

## 2018-07-03 ENCOUNTER — Other Ambulatory Visit: Payer: Self-pay

## 2018-07-03 ENCOUNTER — Encounter: Payer: Self-pay | Admitting: Physician Assistant

## 2018-07-03 VITALS — BP 113/66 | HR 72 | Ht 73.0 in | Wt 229.0 lb

## 2018-07-03 DIAGNOSIS — I480 Paroxysmal atrial fibrillation: Secondary | ICD-10-CM | POA: Diagnosis not present

## 2018-07-03 DIAGNOSIS — I77810 Thoracic aortic ectasia: Secondary | ICD-10-CM

## 2018-07-03 DIAGNOSIS — I5032 Chronic diastolic (congestive) heart failure: Secondary | ICD-10-CM

## 2018-07-03 DIAGNOSIS — I350 Nonrheumatic aortic (valve) stenosis: Secondary | ICD-10-CM

## 2018-07-03 NOTE — Patient Instructions (Addendum)
Medication Instructions:  Your physician recommends that you continue on your current medications as directed. Please refer to the Current Medication list given to you today.  If you need a refill on your cardiac medications before your next appointment, please call your pharmacy.   Lab work: None ordered  If you have labs (blood work) drawn today and your tests are completely normal, you will receive your results only by: Marland Kitchen MyChart Message (if you have MyChart) OR . A paper copy in the mail If you have any lab test that is abnormal or we need to change your treatment, we will call you to review the results.  Testing/Procedures: Your physician has requested that you have an echocardiogram IN September, 2020.  Someone will call you from the scheduling department to get this scheduled. . Echocardiography is a painless test that uses sound waves to create images of your heart. It provides your doctor with information about the size and shape of your heart and how well your heart's chambers and valves are working. This procedure takes approximately one hour. There are no restrictions for this procedure.    Follow-Up: At [facility], you and your health needs are our priority.  As part of our continuing mission to provide you with exceptional heart care, we have created designated Provider Care Teams.  These Care Teams include your primary Cardiologist (physician) and Advanced Practice Providers (APPs -  Physician Assistants and Nurse Practitioners) who all work together to provide you with the care you need, when you need it. You will need a follow up appointment in 6 months.  Please call our office 2 months in advance to schedule this appointment.  You may see Ena Dawley, MD or one of the following Advanced Practice Providers on your designated Care Team:   [place], PA-C Melina Copa, PA-C . Ermalinda Barrios, PA-C  Any Other Special Instructions Will Be Listed Below (If  Applicable).

## 2018-07-09 ENCOUNTER — Other Ambulatory Visit: Payer: Self-pay

## 2018-07-09 ENCOUNTER — Ambulatory Visit (INDEPENDENT_AMBULATORY_CARE_PROVIDER_SITE_OTHER): Payer: Medicare Other | Admitting: General Practice

## 2018-07-09 DIAGNOSIS — Z7901 Long term (current) use of anticoagulants: Secondary | ICD-10-CM | POA: Diagnosis not present

## 2018-07-09 LAB — POCT INR: INR: 3 (ref 2.0–3.0)

## 2018-07-09 NOTE — Progress Notes (Signed)
I have reviewed this visit and I agree on the patient's plan of dosage and recommendations. Briscoe Deutscher, DO

## 2018-07-09 NOTE — Patient Instructions (Addendum)
Pre visit review using our clinic review tool, if applicable. No additional management support is needed unless otherwise documented below in the visit note.  Continue to take 1/2 tablet daily except take 1 tablet on Monday/Wed/Fridays and recheck in 6 weeks.

## 2018-07-11 ENCOUNTER — Telehealth: Payer: Self-pay | Admitting: Physician Assistant

## 2018-07-11 ENCOUNTER — Telehealth: Payer: Self-pay | Admitting: Family Medicine

## 2018-07-11 NOTE — Telephone Encounter (Signed)
Anderson Malta, can you please call pt's PCP's office to request they take a look at a message I sent her on 07/03/18? I had routed our telemedicine visit and also sent the following staff message with question about his CBC:  "Hi Dr. Juleen China, we share this mutual patient who I saw via telehealth today for Dr. Meda Coffee. I sent you this message in chart routing but I do not know if this function works anymore with the telehealth format so wanted to send it via staff message just in case. Mr. Desroches is feeling great. He is due for an annual CBC since he's on Coumadin for his afib. You had sent him for labs in the fall but it looks like the CBC was not resulted and so you had planned to send him to the cancer center for his next lab draw (he has cold agglutinin disease). I am just reaching out to close the loop on when this would be since his last CBC was 03/2017. Is this something you can draw when he comes in for the INR in your office to limit exposure? Otherwise I would appreciate your help on getting it set up through the [facility] as I'm not quite sure how to do that. Since he is not having any bleeding issues I would be fine with it waiting 2 months if needed.  Thank you for all you are doing and stay safe!  Ed Rayson PA-C"    Thanks, [employer]

## 2018-07-11 NOTE — Telephone Encounter (Signed)
Please see message. °

## 2018-07-11 NOTE — Telephone Encounter (Signed)
Called PCP's office, Dr. Juleen China.  Spoke with Santiago Glad and she will get a message to Dr. Juleen China and her Nurse to take a look at the message from [facility] [place].

## 2018-07-11 NOTE — Telephone Encounter (Signed)
See note  Copied from [place] [phone]. Topic: General - Other >> Jul 11, 2018  8:21 AM Leward Quan A wrote: Reason for CRM: Anderson Malta with Heart care called to inform Dr Juleen China that provider Sharrell Ku sent a message few days ago. Asking if it is possible to please take a look at that message and respond if there are any questions please call Ph#  [phone]

## 2018-07-11 NOTE — Telephone Encounter (Signed)
There is an open staff message about this. Please see.

## 2018-07-14 NOTE — Telephone Encounter (Signed)
Called the [facility] and was advised that he seems to be a new patient, doesn't look like he has had an appointment there before. Was placed on hold and sent to VM. Will attempt to call back later today.

## 2018-07-15 ENCOUNTER — Telehealth: Payer: Self-pay | Admitting: *Deleted

## 2018-07-15 NOTE — Telephone Encounter (Signed)
Called the cancer center and left VM for triage nurse to call me back.

## 2018-07-15 NOTE — Telephone Encounter (Signed)
"  Brandy, Dr. Briscoe Deutscher [phone]).  Dr. Juleen China placed order for CBC back in [place] for [facility] to have a CBC done.  Hoping to get this done with you guys.  He's been there before in 2015 or 2016.  Please return call"  Followed by [facility] provider at [facility] 12-25-2013.  No future appointments.  Message left for [facility] at [facility]. 1. Med [facility] phone number [phone]). 2. New patient referral needed.  Last provider visit four and a half years ago.   3. Appointment note added for lab to look for outside office orders to performed with lab scheduled by our providers but unable to schedule appointment.  [facility] is not a lab collection site.

## 2018-07-15 NOTE — Telephone Encounter (Signed)
Please give the patient the option.

## 2018-07-15 NOTE — Telephone Encounter (Signed)
Rosalind returned call. She advised that pt was prior pt of Dr Marin Olp at med center [facility], [phone]. He has not been seen in over 3 years so he would be considered a new patient and would need a new referral to re-establish care. He was last seen 12/2014, so he would need a new referral. If he were a current patient, they are glad to do things for outside offices but he would need to have a scheduled appointment, they cannot schedule labs to be done there for outside providers only.   Dr. Juleen China, do you want to schedule pt for CBC here or place new referral to re-establish care with oncology?

## 2018-07-15 NOTE — Telephone Encounter (Signed)
Called pt, he would prefer to come here for lab appointment because he lives right down the road. Pt has been scheduled for this coming Thursday for CBC w/ diff.

## 2018-07-16 ENCOUNTER — Other Ambulatory Visit: Payer: Self-pay

## 2018-07-16 DIAGNOSIS — D5912 Cold autoimmune hemolytic anemia: Secondary | ICD-10-CM

## 2018-07-16 DIAGNOSIS — D591 Other autoimmune hemolytic anemias: Principal | ICD-10-CM

## 2018-07-16 DIAGNOSIS — R7989 Other specified abnormal findings of blood chemistry: Secondary | ICD-10-CM

## 2018-07-16 NOTE — Progress Notes (Deleted)
Virtual Visit via Video   Due to the COVID-19 pandemic, this visit was completed with telemedicine (audio/video) technology to reduce patient and provider exposure as well as to preserve personal protective equipment.   I connected with Steven Shepard by a video enabled telemedicine application and verified that I am speaking with the correct person using two identifiers. Location patient: Home Location provider: [facility], Office Persons participating in the virtual visit: Khayree, Delellis, CMA   I discussed the limitations of evaluation and management by telemedicine and the availability of in person appointments. The patient expressed understanding and agreed to proceed.  Care Team   Patient Care Team: Briscoe Deutscher, DO as PCP - General (Family Medicine) Dorothy Spark, MD as PCP - Cardiology (Cardiology) Sharyne Peach, MD as Consulting Physician (Ophthalmology) Dorothy Spark, MD as Consulting Physician (Cardiology) Jovita Gamma, MD as Consulting Physician (Neurosurgery) Laurence Spates, MD as Consulting Physician (Gastroenterology)  Subjective:   HPI:   ROS   Patient Active Problem List   Diagnosis Date Noted  . Umbilical hernia   . Hypertension   . History of neck surgery   . DOE (dyspnea on exertion)   . COPD (chronic obstructive pulmonary disease) ([place])   . Atrial fibrillation ([place])   . Gallstone 04/26/2016  . NASH (nonalcoholic steatohepatitis) 04/26/2016  . Obesity (BMI 30.0-34.9) 04/26/2016  . Osteoarthrosis involving lower leg 05/21/2015  . Acute diastolic heart failure ([place]) 02/16/2015  . Benign non-nodular prostatic hyperplasia 01/11/2014  . Lower extremity edema 08/24/2013  . 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 Systolic murmur of aorta 09/47/0962  . Elevated prostate specific antigen (PSA) 01/10/2012  . 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[place]) 01/23/2011  . Anemia, hemolytic ([place]) 01/22/2011  . Edema 12/26/2010  . Long  term (current) use of anticoagulants 06/20/2010  . PAF (paroxysmal atrial fibrillation) ([place]) 06/09/2010  . Atherosclerosis of native arteries of extremity with rest pain ([place]) 02/14/2010  . Spinal stenosis of lumbar region with neurogenic claudication 02/14/2010  . Thoracic or lumbosacral neuritis or radiculitis, unspecified 01/10/2010  . Chronic obstructive pulmonary disease ([place]) 08/10/2009  . Obstructive sleep apnea syndrome, on CPAP 03/02/2008  . Hyperlipidemia 03/01/2008  . Gastroesophageal reflux disease with esophagitis 11/27/2007    Social History   Tobacco Use  . Smoking status: Former Smoker    Packs/day: 1.50    Years: 3.00    Pack years: 4.50    Types: Cigarettes, Pipe    Last attempt to quit: 03/12/1973    Years since quitting: 45.3  . Smokeless tobacco: Never Used  . Tobacco comment: quit appox 36 years ago  Substance Use Topics  . Alcohol use: Yes    Comment: occasionally, 1-7 drinks per week    Current Outpatient Medications:  .  acetaminophen (TYLENOL) 650 MG CR tablet, Take 650 mg by mouth 2 (two) times daily as needed for pain. , Disp: , Rfl:  .  atorvastatin (LIPITOR) 20 MG tablet, TAKE 1 TABLET BY MOUTH DAILY, Disp: 90 tablet, Rfl: 1 .  CARTIA XT 180 MG 24 hr capsule, TAKE 2 CAPSULES BY MOUTH EVERY DAY, Disp: 180 capsule, Rfl: 1 .  cloNIDine (CATAPRES) 0.3 MG tablet, TAKE 1 TABLET BY MOUTH TWICE DAILY, Disp: 180 tablet, Rfl: 3 .  Fexofenadine HCl (ALLEGRA PO), Take 1 tablet by mouth every morning. , Disp: , Rfl:  .  folic acid (FOLVITE) 836 MCG tablet, Take 400 mcg by mouth 2 (two) times daily., Disp: , Rfl:  .  furosemide (LASIX)  40 MG tablet, TAKE 1 TABLET BY MOUTH TWICE DAILY( 1 TABLET AT 8 AM AND 1 TABLET AT 2 PM)., Disp: 180 tablet, Rfl: 2 .  metFORMIN (GLUCOPHAGE) 1000 MG tablet, TAKE 1 TABLET BY MOUTH TWICE DAILY WITH A MEAL, Disp: 180 tablet, Rfl: 3 .  metoprolol succinate (TOPROL-XL) 50 MG 24 hr tablet, TAKE 1 TABLET BY MOUTH TWICE DAILY, Disp: 180  tablet, Rfl: 2 .  Multiple Vitamin (MULTIVITAMIN) capsule, Take 1 capsule by mouth daily.  , Disp: , Rfl:  .  Multiple Vitamins-Minerals (EMERGEN-C IMMUNE PO), Take 1 tablet by mouth daily. , Disp: , Rfl:  .  Omega-3 Fatty Acids (OMEGA 3 500) 500 MG CAPS, Take 500 capsules by mouth daily., Disp: , Rfl:  .  potassium chloride SA (K-DUR,KLOR-CON) 20 MEQ tablet, TAKE 1 TABLET BY MOUTH TWICE DAILY, Disp: 180 tablet, Rfl: 2 .  Psyllium (METAMUCIL FIBER PO), Take 1 Dose by mouth daily. , Disp: , Rfl:  .  tamsulosin (FLOMAX) 0.4 MG CAPS capsule, TAKE ONE CAPSULE BY MOUTH EVERY DAY, Disp: 90 capsule, Rfl: 1 .  warfarin (COUMADIN) 5 MG tablet, Take 1/2 tablet daily except take 1 tablet on Mon Wed and Fri or TAKE AS DIRECTED BY COUMADIN CLINIC, Disp: 30 tablet, Rfl: 3  Allergies  Allergen Reactions  . Ace Inhibitors Other (See Comments)    Other reaction(s): Other (See Comments) High K+  . Codeine     Other reaction(s): Other (See Comments) Hallucinations  . Spironolactone     Increases potassium    Objective:   VITALS: Per patient if applicable, see vitals. GENERAL: Alert, appears well and in no acute distress. HEENT: Atraumatic, conjunctiva clear, no obvious abnormalities on inspection of external nose and ears. NECK: Normal movements of the head and neck. CARDIOPULMONARY: No increased WOB. Speaking in clear sentences. I:E ratio WNL.  MS: Moves all visible extremities without noticeable abnormality. PSYCH: Pleasant and cooperative, well-groomed. Speech normal rate and rhythm. Affect is appropriate. Insight and judgement are appropriate. Attention is focused, linear, and appropriate.  NEURO: CN grossly intact. Oriented as arrived to appointment on time with no prompting. Moves both UE equally.  SKIN: No obvious lesions, wounds, erythema, or cyanosis noted on face or hands.  Depression screen [facility] 2/9 01/08/2018 01/01/2018 12/25/2016  Decreased Interest 0 0 0  Down, Depressed, Hopeless 0 0 0   PHQ - 2 Score 0 0 0  Some recent data might be hidden    Assessment and Plan:   There are no diagnoses linked to this encounter.  Marland Kitchen COVID-19 Education: The signs and symptoms of COVID-19 were discussed with the patient and how to seek care for testing if needed. The importance of social distancing was discussed today. . Reviewed expectations re: course of current medical issues. . Discussed self-management of symptoms. . Outlined signs and symptoms indicating need for more acute intervention. . Patient verbalized understanding and all questions were answered. Marland Kitchen Health Maintenance issues including appropriate healthy diet, exercise, and smoking avoidance were discussed with patient. . See orders for this visit as documented in the electronic medical record.  Francella Solian, CMA  Records requested if needed. Time spent: *** minutes, of which >50% was spent in obtaining information about his symptoms, reviewing his previous labs, evaluations, and treatments, counseling him about his condition (please see the discussed topics above), and developing a plan to further investigate it; he had a number of questions which I addressed.

## 2018-07-17 ENCOUNTER — Other Ambulatory Visit: Payer: Self-pay

## 2018-07-17 ENCOUNTER — Other Ambulatory Visit: Payer: Medicare Other

## 2018-07-17 ENCOUNTER — Other Ambulatory Visit (INDEPENDENT_AMBULATORY_CARE_PROVIDER_SITE_OTHER): Payer: Medicare Other

## 2018-07-17 DIAGNOSIS — D591 Other autoimmune hemolytic anemias: Principal | ICD-10-CM

## 2018-07-17 DIAGNOSIS — D5912 Cold autoimmune hemolytic anemia: Secondary | ICD-10-CM

## 2018-07-17 DIAGNOSIS — R7989 Other specified abnormal findings of blood chemistry: Secondary | ICD-10-CM

## 2018-07-17 LAB — CBC WITH DIFFERENTIAL/PLATELET

## 2018-07-17 LAB — EXTRA BLUE TOP TUBE

## 2018-07-18 ENCOUNTER — Encounter: Payer: Self-pay | Admitting: Family Medicine

## 2018-07-18 ENCOUNTER — Telehealth: Payer: Self-pay | Admitting: Radiology

## 2018-07-18 DIAGNOSIS — D5912 Cold autoimmune hemolytic anemia: Secondary | ICD-10-CM

## 2018-07-18 NOTE — Telephone Encounter (Signed)
Spoke with patient, placed referral to hematology.

## 2018-07-18 NOTE — Telephone Encounter (Signed)
FYI

## 2018-07-23 ENCOUNTER — Telehealth: Payer: Self-pay | Admitting: Family

## 2018-07-23 NOTE — Telephone Encounter (Signed)
Spoke with PATIENT TO CONFIRM NEW PATIENT APPT 6/9 AT 10:15. PT AWARE OF APPT DATE/TIME/LOCATION

## 2018-07-30 DIAGNOSIS — D225 Melanocytic nevi of trunk: Secondary | ICD-10-CM | POA: Diagnosis not present

## 2018-07-30 DIAGNOSIS — L821 Other seborrheic keratosis: Secondary | ICD-10-CM | POA: Diagnosis not present

## 2018-07-30 DIAGNOSIS — D2372 Other benign neoplasm of skin of left lower limb, including hip: Secondary | ICD-10-CM | POA: Diagnosis not present

## 2018-07-30 DIAGNOSIS — Z85820 Personal history of malignant melanoma of skin: Secondary | ICD-10-CM | POA: Diagnosis not present

## 2018-07-30 DIAGNOSIS — Z85828 Personal history of other malignant neoplasm of skin: Secondary | ICD-10-CM | POA: Diagnosis not present

## 2018-07-30 DIAGNOSIS — L814 Other melanin hyperpigmentation: Secondary | ICD-10-CM | POA: Diagnosis not present

## 2018-07-30 DIAGNOSIS — L570 Actinic keratosis: Secondary | ICD-10-CM | POA: Diagnosis not present

## 2018-08-16 ENCOUNTER — Other Ambulatory Visit: Payer: Self-pay | Admitting: Family Medicine

## 2018-08-18 ENCOUNTER — Other Ambulatory Visit: Payer: Self-pay | Admitting: Family

## 2018-08-18 DIAGNOSIS — D5912 Cold autoimmune hemolytic anemia: Secondary | ICD-10-CM

## 2018-08-18 NOTE — Telephone Encounter (Signed)
Last OV 01/01/2018 Last refill 02/21/2018 #90/1 Next OV not scheduled

## 2018-08-19 ENCOUNTER — Encounter: Payer: Self-pay | Admitting: Family

## 2018-08-19 ENCOUNTER — Inpatient Hospital Stay (HOSPITAL_BASED_OUTPATIENT_CLINIC_OR_DEPARTMENT_OTHER): Payer: Medicare Other | Admitting: Family

## 2018-08-19 ENCOUNTER — Other Ambulatory Visit: Payer: Self-pay

## 2018-08-19 ENCOUNTER — Inpatient Hospital Stay: Payer: Medicare Other | Attending: Family

## 2018-08-19 VITALS — BP 124/60 | HR 57 | Temp 98.3°F | Resp 18 | Ht 73.0 in | Wt 231.0 lb

## 2018-08-19 DIAGNOSIS — D591 Other autoimmune hemolytic anemias: Secondary | ICD-10-CM

## 2018-08-19 DIAGNOSIS — I4891 Unspecified atrial fibrillation: Secondary | ICD-10-CM | POA: Diagnosis not present

## 2018-08-19 DIAGNOSIS — Z87891 Personal history of nicotine dependence: Secondary | ICD-10-CM

## 2018-08-19 DIAGNOSIS — D891 Cryoglobulinemia: Secondary | ICD-10-CM | POA: Insufficient documentation

## 2018-08-19 DIAGNOSIS — D5912 Cold autoimmune hemolytic anemia: Secondary | ICD-10-CM

## 2018-08-19 DIAGNOSIS — Z7901 Long term (current) use of anticoagulants: Secondary | ICD-10-CM

## 2018-08-19 LAB — CMP (CANCER CENTER ONLY)
ALT: 18 U/L (ref 0–44)
AST: 15 U/L (ref 15–41)
Albumin: 4.4 g/dL (ref 3.5–5.0)
Alkaline Phosphatase: 73 U/L (ref 38–126)
Anion gap: 7 (ref 5–15)
BUN: 20 mg/dL (ref 8–23)
CO2: 30 mmol/L (ref 22–32)
Calcium: 9.6 mg/dL (ref 8.9–10.3)
Chloride: 102 mmol/L (ref 98–111)
Creatinine: 0.94 mg/dL (ref 0.61–1.24)
GFR, Est AFR Am: >60 mL/min (ref >60)
GFR, Estimated: >60 mL/min (ref >60)
Glucose, Bld: 133 mg/dL — ABNORMAL HIGH (ref 70–99)
Potassium: 4.1 mmol/L (ref 3.5–5.1)
Sodium: 139 mmol/L (ref 135–145)
Total Bilirubin: 1.6 mg/dL — ABNORMAL HIGH (ref 0.3–1.2)
Total Protein: 6.7 g/dL (ref 6.5–8.1)

## 2018-08-19 LAB — CBC WITH DIFFERENTIAL (CANCER CENTER ONLY)
Abs Immature Granulocytes: 0.04 10*3/uL (ref 0.00–0.07)
Basophils Absolute: 0 10*3/uL (ref 0.0–0.1)
Basophils Relative: 1 %
Eosinophils Absolute: 0.1 10*3/uL (ref 0.0–0.5)
Eosinophils Relative: 1 %
HCT: UNDETERMINED % (ref 39.0–52.0)
Hemoglobin: 12.3 g/dL — ABNORMAL LOW (ref 13.0–17.0)
Immature Granulocytes: 1 %
Lymphocytes Relative: 30 %
Lymphs Abs: 2 10*3/uL (ref 0.7–4.0)
MCH: UNDETERMINED pg (ref 26.0–34.0)
MCHC: UNDETERMINED g/dL (ref 30.0–36.0)
MCV: UNDETERMINED fL (ref 80.0–100.0)
Monocytes Absolute: 0.6 10*3/uL (ref 0.1–1.0)
Monocytes Relative: 9 %
Neutro Abs: 3.9 10*3/uL (ref 1.7–7.7)
Neutrophils Relative %: 58 %
Platelet Count: 207 10*3/uL (ref 150–400)
RBC: UNDETERMINED MIL/uL (ref 4.22–5.81)
RDW: UNDETERMINED % (ref 11.5–15.5)
WBC Count: 6.6 10*3/uL (ref 4.0–10.5)
nRBC: 0 % (ref 0.0–0.2)

## 2018-08-19 LAB — SAVE SMEAR(SSMR), FOR PROVIDER SLIDE REVIEW

## 2018-08-19 NOTE — Progress Notes (Signed)
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 He is actually an old patient of ours. We last saw him in 2015.  He continues to do well. He is sensitive to cold temperatures and states that his nose, face and ears with turn blue if he doesn't stay bundled up.  He is enjoying this wonderful warm summer weather right now.  He is still taking his folic acid daily.  Hgb is stable at 12.3, WBC count 6.6 and platelets 207.  He has history of atrial fib and takes coumadin daily. He will occasionally note palpitations. His INR is monitored regularly by his PCP and he states that this has been therapeutic. He goes again to be checked tomorrow before heading to the beach.  There is no family history of blood abnormalities in his family that he is aware of. No history of cancer.  His sister had vascular insufficiency requiring lower extremity amputation.  No swelling, tenderness, numbness or tinging in his extremities at this time. He takes lasix twice daily which helps reduce any fluid retention.  No lymphadenopathy noted on exam.  His last PSA we have on record was in 2018. This was stable at that time at 0.74.  He has sleep apnea and does use his CPAP nightly.  No fever, chills, n/v, cough, rash, dizziness, headaches, blurred vision, SOB, chest pain, abdominal pain or changes in bowel or bladder habits.  He will take metamucil as needed for constipation.  He does not smoke and will only occasionally have an alcoholic beverage socially.  He has maintained a good appetite and is staying well hydrated.  His weight is described as stable.   ROS: All other 10 point review of systems is negative.   PAST MEDICAL HISTORY:   Past Medical History:  Diagnosis Date  . Anemia, hemolytic ([place]) 01/22/2011  . Aortic stenosis   . 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[place]) 01/23/2011  . COPD (chronic obstructive pulmonary disease) ([place])   . DOE (dyspnea on exertion)   . Elevated bilirubin   . Fatty liver   . History of neck surgery   . Hyperlipidemia   . Hypertension   . Mild dilation of ascending aorta ([facility])   . OSA on CPAP   . PAF (paroxysmal atrial fibrillation) ([place])   . Pre-diabetes   . Umbilical hernia     ALLERGIES: Allergies  Allergen Reactions  . Ace Inhibitors Other (See Comments)    Other reaction(s): Other (See Comments) High K+  . Codeine     Other reaction(s): Other (See Comments) Hallucinations  . Spironolactone     Increases potassium      MEDICATIONS:  Current Outpatient Medications on File Prior to Visit  Medication Sig Dispense Refill  . acetaminophen (TYLENOL) 650 MG CR tablet Take 650 mg by mouth 2 (two) times daily as needed for pain.     Marland Kitchen atorvastatin (LIPITOR) 20 MG tablet TAKE 1 TABLET BY MOUTH DAILY 90 tablet 1  . CARTIA XT 180 MG 24 hr capsule TAKE 2 CAPSULES BY MOUTH EVERY DAY 180 capsule 1  . cloNIDine (CATAPRES) 0.3 MG  tablet TAKE 1 TABLET BY MOUTH TWICE DAILY 180 tablet 3  . Fexofenadine HCl (ALLEGRA PO) Take 1 tablet by mouth every morning.     . folic acid (FOLVITE) 782 MCG tablet Take 400 mcg by mouth 2 (two) times daily.    . furosemide (LASIX) 40 MG tablet TAKE 1 TABLET BY MOUTH TWICE DAILY( 1 TABLET AT 8 AM AND 1 TABLET AT 2 PM). 180 tablet 2  . metFORMIN (GLUCOPHAGE) 1000 MG tablet TAKE 1 TABLET BY MOUTH TWICE DAILY WITH A MEAL 180 tablet 3  . metoprolol succinate (TOPROL-XL) 50 MG 24 hr tablet TAKE 1 TABLET BY MOUTH TWICE DAILY 180 tablet 2  . Multiple Vitamin (MULTIVITAMIN) capsule Take 1 capsule by mouth daily.      . Multiple Vitamins-Minerals  (EMERGEN-C IMMUNE PO) Take 1 tablet by mouth daily.     . Omega-3 Fatty Acids (OMEGA 3 500) 500 MG CAPS Take 500 capsules by mouth daily.    . potassium chloride SA (K-DUR,KLOR-CON) 20 MEQ tablet TAKE 1 TABLET BY MOUTH TWICE DAILY 180 tablet 2  . Psyllium (METAMUCIL FIBER PO) Take 1 Dose by mouth daily.     . tamsulosin (FLOMAX) 0.4 MG CAPS capsule TAKE ONE CAPSULE BY MOUTH EVERY DAY 90 capsule 1  . warfarin (COUMADIN) 5 MG tablet Take 1/2 tablet daily except take 1 tablet on Mon Wed and Fri or TAKE AS DIRECTED BY COUMADIN CLINIC 30 tablet 3   No current facility-administered medications on file prior to visit.      PAST SURGICAL HISTORY Past Surgical History:  Procedure Laterality Date  . ADENOIDECTOMY    . HIATAL HERNIA REPAIR  2011  . NECK SURGERY  2005  . SKIN SURGERY     skin cancer  . TONSILLECTOMY    . TOTAL HIP ARTHROPLASTY Right 09/18/2010   Dr. Maureen Ralphs    FAMILY HISTORY: Family History  Problem Relation Age of Onset  . Coronary artery disease Mother   . Hypertension Mother   . Heart attack Mother   . Coronary artery disease Father   . Hypertension Father   . Heart attack Father   . Diabetes Sister   . Hypertension Sister   . Diabetes Brother   . Cancer Brother   . Colon cancer Neg Hx   . Colonic polyp Neg Hx     SOCIAL HISTORY:  reports that he quit smoking about 45 years ago. His smoking use included cigarettes and pipe. He has a 4.50 pack-year smoking history. He has never used smokeless tobacco. He reports current alcohol use. He reports that he does not use drugs.  PERFORMANCE STATUS: The patient's performance status is 0 - Asymptomatic  PHYSICAL EXAM: Most Recent Vital Signs: There were no vitals taken for this visit. There were no vitals taken for this visit.  General Appearance:    Alert, cooperative, no distress, appears stated age  Head:    Normocephalic, without obvious abnormality, atraumatic  Eyes:    PERRL, conjunctiva/corneas clear, EOM's  intact, fundi    benign, both eyes             Throat:   Lips, mucosa, and tongue normal; teeth and gums normal  Neck:   Supple, symmetrical, trachea midline, no adenopathy;       thyroid:  No enlargement/tenderness/nodules; no carotid   bruit or JVD  Back:     Symmetric, no curvature, ROM normal, no CVA tenderness  Lungs:     Clear to auscultation bilaterally, respirations  unlabored  Chest wall:    No tenderness or deformity  Heart:    Regular rate and rhythm, S1 and S2 normal, no murmur, rub   or gallop  Abdomen:     Soft, non-tender, bowel sounds active all four quadrants,    no masses, no organomegaly        Extremities:   Extremities normal, atraumatic, no cyanosis or edema  Pulses:   2+ and symmetric all extremities  Skin:   Skin color, texture, turgor normal, no rashes or lesions  Lymph nodes:   Cervical, supraclavicular, and axillary nodes normal  Neurologic:   CNII-XII intact. Normal strength, sensation and reflexes      throughout    LABORATORY DATA:  Results for orders placed or performed in visit on 08/19/18 (from the past 48 hour(s))  CMP ([facility] only)     Status: Abnormal   Collection Time: 08/19/18 10:14 AM  Result Value Ref Range   Sodium 139 135 - 145 mmol/L   Potassium 4.1 3.5 - 5.1 mmol/L   Chloride 102 98 - 111 mmol/L   CO2 30 22 - 32 mmol/L   Glucose, Bld 133 (H) 70 - 99 mg/dL   BUN 20 8 - 23 mg/dL   Creatinine 0.94 0.61 - 1.24 mg/dL   Calcium 9.6 8.9 - 10.3 mg/dL   Total Protein 6.7 6.5 - 8.1 g/dL   Albumin 4.4 3.5 - 5.0 g/dL   AST 15 15 - 41 U/L   ALT 18 0 - 44 U/L   Alkaline Phosphatase 73 38 - 126 U/L   Total Bilirubin 1.6 (H) 0.3 - 1.2 mg/dL   GFR, Est Non Af Am >60 >60 mL/min   GFR, Est AFR Am >60 >60 mL/min   Anion gap 7 5 - 15    Comment: Performed at [facility] at [facility], [address] [place], [place] [address]      RADIOGRAPHY: No results found.     PATHOLOGY: None   ASSESSMENT/PLAN: Mr.  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  He continues to do well and has no complaints at this time.  He will continue taking his folic acid daily.  We will continues to follow along with him and plan to see him back in a year.   All questions were answered and he is in agreement with the plan. He will contact our office with any questions or concerns. We can certainly see the him sooner if need be.  He was discussed with and also seen by Dr. Marin Olp and he is in agreement with the aforementioned.   Laverna Peace, NP   Addendum: It was nice to see Mr. Mastro again.  He now is retired.  He has this cold agglutinin disease.  This has been totally asymptomatic with him.  He has had no problems with significant anemia.  His titer is quite high at 1:4096.  His immunoglobulin levels are okay.  His IgM level is a little bit.  His been a while since we last saw him.  Since then, he has been doing okay.  He is actually going to the beach after our office meeting today.  Since he is asymptomatic, I just do not think that we have to intervene right now.  He does not need a bone marrow test.  He does not need any scans done.  I think we  can probably get him back in a another year.  Because of the cold agglutinin, it would always be difficult to get some of the blood parameters but as long as the white cell count, hemoglobin and platelet count can be obtained, there should not be a problem.  He is certainly knows that he can come back sooner if he has any problems.  He is on folic acid.  We will continue the folic acid.  He spent about 40 minutes with him today.  Lattie Haw, MD

## 2018-08-20 ENCOUNTER — Telehealth: Payer: Self-pay | Admitting: Hematology & Oncology

## 2018-08-20 ENCOUNTER — Ambulatory Visit (INDEPENDENT_AMBULATORY_CARE_PROVIDER_SITE_OTHER): Payer: Medicare Other | Admitting: General Practice

## 2018-08-20 ENCOUNTER — Other Ambulatory Visit: Payer: Self-pay | Admitting: Family Medicine

## 2018-08-20 DIAGNOSIS — Z7901 Long term (current) use of anticoagulants: Secondary | ICD-10-CM

## 2018-08-20 LAB — POCT INR: INR: 2.5 (ref 2.0–3.0)

## 2018-08-20 LAB — IGG, IGA, IGM
IgA: 220 mg/dL (ref 61–437)
IgG (Immunoglobin G), Serum: 946 mg/dL (ref 603–1613)
IgM (Immunoglobulin M), Srm: 172 mg/dL — ABNORMAL HIGH (ref 15–143)

## 2018-08-20 LAB — COLD AGGLUTININ TITER: Cold Agglutinin Titer: >1:4096 {titer}

## 2018-08-20 NOTE — Telephone Encounter (Signed)
Okay refill but make appointment.

## 2018-08-20 NOTE — Progress Notes (Signed)
I have reviewed this visit and I agree on the patient's plan of dosage and recommendations. Briscoe Deutscher, DO

## 2018-08-20 NOTE — Patient Instructions (Addendum)
Pre visit review using our clinic review tool, if applicable. No additional management support is needed unless otherwise documented below in the visit note.  Continue to take 1/2 tablet daily except take 1 tablet on Monday/Wed/Fridays and recheck in 6 weeks.

## 2018-08-20 NOTE — Telephone Encounter (Signed)
Appointments scheduled letter mailed per 6/9 los

## 2018-08-24 LAB — CRYOGLOBULIN

## 2018-08-26 ENCOUNTER — Other Ambulatory Visit: Payer: Medicare Other

## 2018-08-26 ENCOUNTER — Ambulatory Visit: Payer: Medicare Other | Admitting: Family

## 2018-08-27 ENCOUNTER — Other Ambulatory Visit: Payer: Self-pay | Admitting: Cardiology

## 2018-08-27 DIAGNOSIS — I480 Paroxysmal atrial fibrillation: Secondary | ICD-10-CM

## 2018-08-27 DIAGNOSIS — I5031 Acute diastolic (congestive) heart failure: Secondary | ICD-10-CM

## 2018-08-31 ENCOUNTER — Encounter: Payer: Self-pay | Admitting: Family

## 2018-09-21 ENCOUNTER — Other Ambulatory Visit: Payer: Self-pay | Admitting: Family Medicine

## 2018-09-24 ENCOUNTER — Ambulatory Visit (INDEPENDENT_AMBULATORY_CARE_PROVIDER_SITE_OTHER): Payer: Medicare Other | Admitting: General Practice

## 2018-09-24 ENCOUNTER — Other Ambulatory Visit: Payer: Self-pay | Admitting: Family Medicine

## 2018-09-24 ENCOUNTER — Other Ambulatory Visit: Payer: Self-pay

## 2018-09-24 DIAGNOSIS — Z7901 Long term (current) use of anticoagulants: Secondary | ICD-10-CM

## 2018-09-24 LAB — POCT INR: INR: 2.4 (ref 2.0–3.0)

## 2018-09-24 NOTE — Patient Instructions (Addendum)
Pre visit review using our clinic review tool, if applicable. No additional management support is needed unless otherwise documented below in the visit note.  Continue to take 1/2 tablet daily except take 1 tablet on Monday/Wed/Fridays and recheck in 6 weeks.

## 2018-09-25 ENCOUNTER — Other Ambulatory Visit: Payer: Self-pay

## 2018-09-25 ENCOUNTER — Other Ambulatory Visit: Payer: Self-pay | Admitting: Family Medicine

## 2018-09-25 NOTE — Progress Notes (Signed)
Error

## 2018-09-25 NOTE — Telephone Encounter (Signed)
Rx request Last fill 08/20/18 Last OV 02/2018

## 2018-10-01 ENCOUNTER — Ambulatory Visit: Payer: Medicare Other

## 2018-10-23 ENCOUNTER — Other Ambulatory Visit: Payer: Self-pay | Admitting: Cardiology

## 2018-11-05 ENCOUNTER — Ambulatory Visit: Payer: Medicare Other

## 2018-11-07 ENCOUNTER — Other Ambulatory Visit: Payer: Self-pay | Admitting: Family Medicine

## 2018-11-07 DIAGNOSIS — Z7901 Long term (current) use of anticoagulants: Secondary | ICD-10-CM

## 2018-11-11 ENCOUNTER — Encounter: Payer: Self-pay | Admitting: Family Medicine

## 2018-11-11 DIAGNOSIS — E119 Type 2 diabetes mellitus without complications: Secondary | ICD-10-CM | POA: Diagnosis not present

## 2018-11-11 DIAGNOSIS — H5203 Hypermetropia, bilateral: Secondary | ICD-10-CM | POA: Diagnosis not present

## 2018-11-11 DIAGNOSIS — H52203 Unspecified astigmatism, bilateral: Secondary | ICD-10-CM | POA: Diagnosis not present

## 2018-11-11 LAB — HM DIABETES EYE EXAM

## 2018-11-12 ENCOUNTER — Ambulatory Visit (INDEPENDENT_AMBULATORY_CARE_PROVIDER_SITE_OTHER): Payer: Medicare Other | Admitting: General Practice

## 2018-11-12 ENCOUNTER — Other Ambulatory Visit: Payer: Self-pay

## 2018-11-12 DIAGNOSIS — Z7901 Long term (current) use of anticoagulants: Secondary | ICD-10-CM | POA: Diagnosis not present

## 2018-11-12 LAB — POCT INR: INR: 3 (ref 2.0–3.0)

## 2018-11-12 NOTE — Patient Instructions (Incomplete)
Pre visit review using our clinic review tool, if applicable. No additional management support is needed unless otherwise documented below in the visit note. 

## 2018-11-12 NOTE — Progress Notes (Signed)
I have reviewed this visit and I agree on the patient's plan of dosage and recommendations. Briscoe Deutscher, DO

## 2018-11-13 ENCOUNTER — Encounter (HOSPITAL_COMMUNITY): Payer: Self-pay | Admitting: Physician Assistant

## 2018-11-20 NOTE — Progress Notes (Signed)
Cardiology Office Note    Date:  11/21/2018   ID:  Steven Shepard, DOB [birth year], MRN [id number]  PCP:  Briscoe Deutscher, DO  Cardiologist:  Ena Dawley, MD  Electrophysiologist:  None   Chief Complaint: f/u afib (6 months)  History of Present Illness:   Steven Shepard is a 73 y.o. male with history of PAF, HTN, HLD (followed by PCP), prior moderate ETOH (now sporadic), moderate aortic stenosis, mildly dilated ascending aorta, edema felt 2/2 chronic diastolic CHF, hemolytic anemia, pre-DM, cold agglutinin disease, COPD, OSA on CPAP who presents for 6 month f/u. He was remotely followed by Dr. Verl Blalock and more recently Dr. Meda Coffee. In 2011 he was admitted for acute kidney insufficiency and hyperkalemia >7.5 in setting of spironolactone and benazepril which were stopped. LE venous duplex 2012 was negative for DVT. Had normal ABIs 2015. He has not required prior ischemic evaluation. Prior RUQ [place] 2018 showed fatty infiltration without definite cirrhotic changes. He is followed by Dr. Oletta Lamas with GI. His cold agglutinin disease was discovered incidentally during pre-op bloodwork for hip surgery several years ago. Traditionally he has remained on Coumadin instead of a NOAC because he was told we know for sure that Coumadin works in the setting of cold agglutinin disease. He has learned to limit ETOH intake as this had triggered events in the past. His last echo following that visit 11/2017 showed EF 60-65%, grade 1 DD, moderate aortic stenosis, mildly dilated ascending aorta. Last labs 08/2018 showed K 4.1, Cr 0.94, normal AST/ALT, TBili 1.6 (similar to prior), Hgb 12.3, plt 207.  He returns for follow-up today feeling well. He denies any chest pain, SOB, palpitations, nausea, dizziness or syncope. Blood pressure is running slightly above goal today without specific trigger. It has been controlled in the past. He has rare skips in his heartbeat lasting only a few seconds but no sustained or  bothersome events. No bleeding reported. He drinks 1 rare alcoholic beverage with dinner, sometimes going weeks or months without.   Past Medical History:  Diagnosis Date  . Anemia, hemolytic ([place]) 01/22/2011  . Aortic stenosis   . 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[place]) 01/23/2011  . COPD (chronic obstructive pulmonary disease) ([place])   . DOE (dyspnea on exertion)   . Elevated bilirubin   . Fatty liver   . History of neck surgery   . Hyperlipidemia   . Hypertension   . Mild dilation of ascending aorta ([facility])   . OSA on CPAP   . PAF (paroxysmal atrial fibrillation) ([place])   . Pre-diabetes   . Umbilical hernia     Past Surgical History:  Procedure Laterality Date  . ADENOIDECTOMY    . HIATAL HERNIA REPAIR  2011  . NECK SURGERY  2005  . SKIN SURGERY     skin cancer  . TONSILLECTOMY    . TOTAL HIP ARTHROPLASTY Right 09/18/2010   Dr. Maureen Ralphs    Current Medications: Current Meds  Medication Sig  . acetaminophen (TYLENOL) 650 MG CR tablet Take 650 mg by mouth 2 (two) times daily as needed for pain.   Marland Kitchen atorvastatin (LIPITOR) 20 MG tablet TAKE 1 TABLET BY MOUTH DAILY  . cloNIDine (CATAPRES) 0.3 MG tablet TAKE 1 TABLET BY MOUTH TWICE DAILY  . diltiazem (CARDIZEM CD) 180 MG 24 hr capsule TAKE 2 CAPSULES BY MOUTH EVERY DAY  . Fexofenadine HCl (ALLEGRA PO) Take 1 tablet by mouth every morning.   . folic acid (FOLVITE) 952 MCG tablet Take 400 mcg by mouth 2 (  two) times daily.  . furosemide (LASIX) 40 MG tablet TAKE 1 TABLET BY MOUTH TWICE DAILY( AT 8:00 AM AND 1 TABLET AT 2:00 PM)  . metFORMIN (GLUCOPHAGE) 1000 MG tablet TAKE 1 TABLET BY MOUTH TWICE DAILY WITH A MEAL  . metoprolol succinate (TOPROL-XL) 50 MG 24 hr tablet TAKE 1 TABLET BY MOUTH TWICE DAILY  . Multiple Vitamin (MULTIVITAMIN) capsule Take 1 capsule by mouth daily.    . Multiple Vitamins-Minerals (EMERGEN-C IMMUNE PO) Take 1 tablet by mouth daily.   . Omega-3 Fatty Acids (OMEGA 3 500) 500 MG CAPS Take 500 capsules by mouth daily.   . potassium chloride SA (K-DUR,KLOR-CON) 20 MEQ tablet TAKE 1 TABLET BY MOUTH TWICE DAILY  . Psyllium (METAMUCIL FIBER PO) Take 1 Dose by mouth daily.   . tamsulosin (FLOMAX) 0.4 MG CAPS capsule TAKE 1 CAPSULE BY MOUTH EVERY DAY  . warfarin (COUMADIN) 5 MG tablet TAKE 1/2 TABLET BY MOUTH DAILY EXCEPT 1 TABLET ON MONDAY, WEDNESDAY, FRIDAY OR AS DIRECTED BY COUMADIN CLINIC     Allergies:   Ace inhibitors, Codeine, and Spironolactone   Social History   Socioeconomic History  . Marital status: Married    Spouse name: Not on file  . Number of children: Not on file  . Years of education: Not on file  . Highest education level: Not on file  Occupational History  . Occupation: Architectural technologist: [employer]  Social Needs  . Financial resource strain: Not on file  . Food insecurity    Worry: Not on file    Inability: Not on file  . Transportation needs    Medical: Not on file    Non-medical: Not on file  Tobacco Use  . Smoking status: Former Smoker    Packs/day: 1.50    Years: 3.00    Pack years: 4.50    Types: Cigarettes, Pipe    Quit date: 03/12/1973    Years since quitting: 45.7  . Smokeless tobacco: Never Used  . Tobacco comment: quit appox 36 years ago  Substance and Sexual Activity  . Alcohol use: Yes    Comment: occasionally, 1-7 drinks per week  . Drug use: No  . Sexual activity: Yes  Lifestyle  . Physical activity    Days per week: Not on file    Minutes per session: Not on file  . Stress: Not on file  Relationships  . Social Herbalist on phone: Not on file    Gets together: Not on file    Attends religious service: Not on file    Active member of club or organization: Not on file    Attends meetings of clubs or organizations: Not on file    Relationship status: Not on file  Other Topics Concern  . Not on file  Social History Narrative  . Not on file     Family History:  The patient's family history includes Cancer in his brother; Coronary  artery disease in his father and mother; Diabetes in his brother and sister; Heart attack in his father and mother; Hypertension in his father, mother, and sister. There is no history of Colon cancer or Colonic polyp.  ROS:   Please see the history of present illness.   All other systems are reviewed and otherwise negative.    EKGs/Labs/Other Studies Reviewed:    Studies reviewed were summarized above.   EKG:  EKG is ordered today, personally reviewed, demonstrating NSR 64bpm, no acute STT changes  Recent Labs: 08/19/2018: ALT 18; BUN 20; Creatinine 0.94; Hemoglobin 12.3; Platelet Count 207; Potassium 4.1; Sodium 139  Recent Lipid Panel    Component Value Date/Time   CHOL 132 01/01/2018 1117   TRIG 162.0 (H) 01/01/2018 1117   HDL 39.50 01/01/2018 1117   CHOLHDL 3 01/01/2018 1117   VLDL 32.4 01/01/2018 1117   LDLCALC 60 01/01/2018 1117    PHYSICAL EXAM:    VS:  BP 140/70   Pulse 64   Ht 6' 1"  (1.854 m)   Wt 224 lb 6.4 oz (101.8 kg)   SpO2 95%   BMI 29.61 kg/m   BMI: Body mass index is 29.61 kg/m.  GEN: Well nourished, well developed WM, in no acute distress HEENT: normocephalic, atraumatic Neck: no JVD, carotid bruits, or masses Cardiac: RRR; 2/6 SEM, preserved but quiet S2, no rubs or gallops, no edema  Respiratory:  clear to auscultation bilaterally, normal work of breathing GI: soft, nontender, nondistended, + BS MS: no deformity or atrophy Skin: warm and dry, no rash Neuro:  Alert and Oriented x 3, Strength and sensation are intact, follows commands Psych: euthymic mood, full affect  Wt Readings from Last 3 Encounters:  11/21/18 224 lb 6.4 oz (101.8 kg)  08/19/18 231 lb (104.8 kg)  07/03/18 229 lb (103.9 kg)     ASSESSMENT & PLAN:   1. Paroxysmal atrial fibrillation - maintaining NSR. Continue Coumadin (chosen as above). Continue metoprolol and diltiazem. Of note, he is taking 2 of the 170m diltiazems daily - I was going to change him to a 3627mtablet for  convenience but he says the pharmacy indicated that given his insurance, taking 2 of the 18032mablets was much cheaper than the 360m56mblet available. Therefore, will continue. 2. HTN - elevated today - I have asked him to follow his BP at home the next few days and send us [place] on Tuesday for review. Consideration would be given to changing metoprolol to carvedilol. He had high K with ACEI and spironolactone therefore is not a good candidate for these medicines. He is compliant with CPAP. 3. Moderate aortic stenosis - has f/u echo pending later this month. Asymptomatic. 4. Mildly dilated ascending aorta - f/u echo pending later this month. 5. Chronic diastolic CHF - appears euvolemic. Recent labs 08/2018 stable. 6. Hyperlipidemia - followed by primary care. Labs reviewed above.  Disposition: F/u with Dr. NelsMeda Coffee6 months.  Medication Adjustments/Labs and Tests Ordered: Current medicines are reviewed at length with the patient today.  Concerns regarding medicines are outlined above. Medication changes, Labs and Tests ordered today are summarized above and listed in the Patient Instructions accessible in Encounters.   Signed, DaynCharlie Pitter-C  11/21/2018 3:28 PM    [facility] [id number]  [phone]: [phone]: [phone]

## 2018-11-21 ENCOUNTER — Ambulatory Visit (INDEPENDENT_AMBULATORY_CARE_PROVIDER_SITE_OTHER): Payer: Medicare Other | Admitting: Physician Assistant

## 2018-11-21 ENCOUNTER — Encounter: Payer: Self-pay | Admitting: Physician Assistant

## 2018-11-21 ENCOUNTER — Other Ambulatory Visit: Payer: Self-pay

## 2018-11-21 VITALS — BP 140/70 | HR 64 | Ht 73.0 in | Wt 224.4 lb

## 2018-11-21 DIAGNOSIS — L570 Actinic keratosis: Secondary | ICD-10-CM | POA: Diagnosis not present

## 2018-11-21 DIAGNOSIS — I350 Nonrheumatic aortic (valve) stenosis: Secondary | ICD-10-CM | POA: Diagnosis not present

## 2018-11-21 DIAGNOSIS — I480 Paroxysmal atrial fibrillation: Secondary | ICD-10-CM

## 2018-11-21 DIAGNOSIS — I10 Essential (primary) hypertension: Secondary | ICD-10-CM | POA: Diagnosis not present

## 2018-11-21 DIAGNOSIS — I5032 Chronic diastolic (congestive) heart failure: Secondary | ICD-10-CM

## 2018-11-21 DIAGNOSIS — I77810 Thoracic aortic ectasia: Secondary | ICD-10-CM

## 2018-11-21 DIAGNOSIS — D2261 Melanocytic nevi of right upper limb, including shoulder: Secondary | ICD-10-CM | POA: Diagnosis not present

## 2018-11-21 DIAGNOSIS — Z85820 Personal history of malignant melanoma of skin: Secondary | ICD-10-CM | POA: Diagnosis not present

## 2018-11-21 DIAGNOSIS — D2372 Other benign neoplasm of skin of left lower limb, including hip: Secondary | ICD-10-CM | POA: Diagnosis not present

## 2018-11-21 DIAGNOSIS — E785 Hyperlipidemia, unspecified: Secondary | ICD-10-CM | POA: Diagnosis not present

## 2018-11-21 DIAGNOSIS — Z85828 Personal history of other malignant neoplasm of skin: Secondary | ICD-10-CM | POA: Diagnosis not present

## 2018-11-21 DIAGNOSIS — L814 Other melanin hyperpigmentation: Secondary | ICD-10-CM | POA: Diagnosis not present

## 2018-11-21 DIAGNOSIS — L821 Other seborrheic keratosis: Secondary | ICD-10-CM | POA: Diagnosis not present

## 2018-11-21 DIAGNOSIS — D225 Melanocytic nevi of trunk: Secondary | ICD-10-CM | POA: Diagnosis not present

## 2018-11-21 NOTE — Patient Instructions (Addendum)
Medication Instructions:  Your physician recommends that you continue on your current medications as directed. Please refer to the Current Medication list given to you today.   If you need a refill on your cardiac medications before your next appointment, please call your pharmacy.   Lab work: None ordered If you have labs (blood work) drawn today and your tests are completely normal, you will receive your results only by: Marland Kitchen MyChart Message (if you have MyChart) OR . A paper copy in the mail If you have any lab test that is abnormal or we need to change your treatment, we will call you to review the results.  Testing/Procedures: None was ordered today, however, keep your upcoming Echocardiogram appointment  Follow-Up: At [facility], you and your health needs are our priority.  As part of our continuing mission to provide you with exceptional heart care, we have created designated Provider Care Teams.  These Care Teams include your primary Cardiologist (physician) and Advanced Practice Providers (APPs -  Physician Assistants and Nurse Practitioners) who all work together to provide you with the care you need, when you need it. You will need a follow up appointment in 6 months.  Please call our office 2 months in advance to schedule this appointment.  You may see Ena Dawley, MD or one of the following Advanced Practice Providers on your designated Care Team:   [place], PA-C Melina Copa, PA-C . Ermalinda Barrios, PA-C  Any Other Special Instructions Will Be Listed Below (If Applicable).  To check your blood pressure, choose a time at least 3 hours after taking your blood pressure medicines. Remain seated in a chair for 5 minutes quietly beforehand, then check it. Please send [place] in your readings on Tuesday 11/25/18 for our review.

## 2018-11-23 ENCOUNTER — Other Ambulatory Visit: Payer: Self-pay | Admitting: Cardiology

## 2018-11-27 ENCOUNTER — Other Ambulatory Visit: Payer: Self-pay | Admitting: Physician Assistant

## 2018-11-27 DIAGNOSIS — I5031 Acute diastolic (congestive) heart failure: Secondary | ICD-10-CM

## 2018-11-27 DIAGNOSIS — I480 Paroxysmal atrial fibrillation: Secondary | ICD-10-CM

## 2018-12-02 ENCOUNTER — Ambulatory Visit (HOSPITAL_COMMUNITY): Payer: Medicare Other | Attending: Cardiology

## 2018-12-02 ENCOUNTER — Other Ambulatory Visit: Payer: Self-pay

## 2018-12-02 DIAGNOSIS — G4730 Sleep apnea, unspecified: Secondary | ICD-10-CM | POA: Diagnosis not present

## 2018-12-02 DIAGNOSIS — E785 Hyperlipidemia, unspecified: Secondary | ICD-10-CM | POA: Insufficient documentation

## 2018-12-02 DIAGNOSIS — I77810 Thoracic aortic ectasia: Secondary | ICD-10-CM | POA: Diagnosis not present

## 2018-12-02 DIAGNOSIS — J449 Chronic obstructive pulmonary disease, unspecified: Secondary | ICD-10-CM | POA: Diagnosis not present

## 2018-12-02 DIAGNOSIS — I4891 Unspecified atrial fibrillation: Secondary | ICD-10-CM | POA: Insufficient documentation

## 2018-12-02 DIAGNOSIS — I509 Heart failure, unspecified: Secondary | ICD-10-CM | POA: Insufficient documentation

## 2018-12-03 ENCOUNTER — Encounter: Payer: Self-pay | Admitting: Family Medicine

## 2018-12-17 ENCOUNTER — Ambulatory Visit (INDEPENDENT_AMBULATORY_CARE_PROVIDER_SITE_OTHER): Payer: Medicare Other | Admitting: General Practice

## 2018-12-17 ENCOUNTER — Other Ambulatory Visit: Payer: Self-pay

## 2018-12-17 DIAGNOSIS — Z7901 Long term (current) use of anticoagulants: Secondary | ICD-10-CM | POA: Diagnosis not present

## 2018-12-17 DIAGNOSIS — Z23 Encounter for immunization: Secondary | ICD-10-CM | POA: Diagnosis not present

## 2018-12-17 LAB — POCT INR: INR: 2.5 (ref 2.0–3.0)

## 2018-12-17 NOTE — Patient Instructions (Addendum)
Pre visit review using our clinic review tool, if applicable. No additional management support is needed unless otherwise documented below in the visit note.  Continue to take 1/2 tablet daily except take 1 tablet on Monday/Wed/Fridays and recheck in 6 weeks.

## 2018-12-24 ENCOUNTER — Ambulatory Visit: Payer: Medicare Other

## 2019-01-07 ENCOUNTER — Other Ambulatory Visit: Payer: Self-pay | Admitting: Cardiology

## 2019-01-07 DIAGNOSIS — E7849 Other hyperlipidemia: Secondary | ICD-10-CM

## 2019-01-07 DIAGNOSIS — I110 Hypertensive heart disease with heart failure: Secondary | ICD-10-CM

## 2019-01-14 ENCOUNTER — Ambulatory Visit (INDEPENDENT_AMBULATORY_CARE_PROVIDER_SITE_OTHER): Payer: Medicare Other

## 2019-01-14 ENCOUNTER — Other Ambulatory Visit: Payer: Self-pay

## 2019-01-14 ENCOUNTER — Ambulatory Visit: Payer: Medicare Other | Admitting: Family Medicine

## 2019-01-14 ENCOUNTER — Encounter: Payer: Self-pay | Admitting: Family Medicine

## 2019-01-14 ENCOUNTER — Ambulatory Visit (INDEPENDENT_AMBULATORY_CARE_PROVIDER_SITE_OTHER): Payer: Medicare Other | Admitting: Family Medicine

## 2019-01-14 VITALS — BP 123/72 | HR 6 | Temp 97.8°F | Ht 73.0 in | Wt 227.6 lb

## 2019-01-14 VITALS — BP 122/72 | Temp 97.8°F | Ht 73.0 in | Wt 227.5 lb

## 2019-01-14 DIAGNOSIS — E782 Mixed hyperlipidemia: Secondary | ICD-10-CM

## 2019-01-14 DIAGNOSIS — N400 Enlarged prostate without lower urinary tract symptoms: Secondary | ICD-10-CM | POA: Diagnosis not present

## 2019-01-14 DIAGNOSIS — I10 Essential (primary) hypertension: Secondary | ICD-10-CM

## 2019-01-14 DIAGNOSIS — I350 Nonrheumatic aortic (valve) stenosis: Secondary | ICD-10-CM | POA: Diagnosis not present

## 2019-01-14 DIAGNOSIS — R7303 Prediabetes: Secondary | ICD-10-CM | POA: Diagnosis not present

## 2019-01-14 DIAGNOSIS — Z0000 Encounter for general adult medical examination without abnormal findings: Secondary | ICD-10-CM

## 2019-01-14 LAB — LIPID PANEL
Cholesterol: 143 mg/dL (ref 0–200)
HDL: 38.1 mg/dL — ABNORMAL LOW (ref >39.00)
LDL Cholesterol: 66 mg/dL (ref 0–99)
NonHDL: 104.66
Total CHOL/HDL Ratio: 4
Triglycerides: 194 mg/dL — ABNORMAL HIGH (ref 0.0–149.0)
VLDL: 38.8 mg/dL (ref 0.0–40.0)

## 2019-01-14 LAB — TSH: TSH: 3.85 u[IU]/mL (ref 0.35–4.50)

## 2019-01-14 LAB — COMPREHENSIVE METABOLIC PANEL
ALT: 20 U/L (ref 0–53)
AST: 16 U/L (ref 0–37)
Albumin: 4.1 g/dL (ref 3.5–5.2)
Alkaline Phosphatase: 73 U/L (ref 39–117)
BUN: 20 mg/dL (ref 6–23)
CO2: 30 mEq/L (ref 19–32)
Calcium: 9.1 mg/dL (ref 8.4–10.5)
Chloride: 102 mEq/L (ref 96–112)
Creatinine, Ser: 0.87 mg/dL (ref 0.40–1.50)
GFR: 86.27 mL/min (ref >60.00)
Glucose, Bld: 87 mg/dL (ref 70–99)
Potassium: 4.2 mEq/L (ref 3.5–5.1)
Sodium: 139 mEq/L (ref 135–145)
Total Bilirubin: 1.3 mg/dL — ABNORMAL HIGH (ref 0.2–1.2)
Total Protein: 6.3 g/dL (ref 6.0–8.3)

## 2019-01-14 LAB — PSA: PSA: 1.02 ng/mL (ref 0.10–4.00)

## 2019-01-14 LAB — HEMOGLOBIN A1C: Hgb A1c MFr Bld: 5.6 % (ref 4.6–6.5)

## 2019-01-14 LAB — MICROALBUMIN / CREATININE URINE RATIO
Creatinine,U: 106.6 mg/dL
Microalb Creat Ratio: 0.7 mg/g (ref 0.0–30.0)
Microalb, Ur: <0.7 mg/dL (ref 0.0–1.9)

## 2019-01-14 NOTE — Progress Notes (Signed)
Subjective:   Steven Shepard is a 73 y.o. male who presents for Medicare Annual/Subsequent preventive examination.  Review of Systems:   Cardiac Risk Factors include: advanced age (>88mn, >>60women);hypertension;male gender    Objective:    Vitals: BP 122/72   Temp 97.8 F (36.6 C)   Ht 6' 1"  (1.854 m)   Wt 227 lb 8.2 oz (103.2 kg)   BMI 30.02 kg/m   Body mass index is 30.02 kg/m.  Advanced Directives 01/14/2019 08/19/2018 01/08/2018 12/25/2016 06/10/2013  Does Patient Have a Medical Advance Directive? Yes No Yes Yes Patient has advance directive, copy not in chart  Type of Advance Directive [place] will - [place] Laur[place]burgLiving will [place]Living will HThe Village of Indian HillLiving will  Does patient want to make changes to medical advance directive? No - Patient declined - No - Patient declined No - Patient declined No change requested  Copy of [place] Chart? No - copy requested - No - copy requested No - copy requested -  Would patient like information on creating a medical advance directive? - No - Patient declined - - -    Tobacco Social History   Tobacco Use  Smoking Status Former Smoker  . Packs/day: 1.50  . Years: 3.00  . Pack years: 4.50  . Types: Cigarettes, Pipe  . Quit date: 03/12/1973  . Years since quitting: 45.8  Smokeless Tobacco Never Used  Tobacco Comment   quit appox 36 years ago     Counseling given: Not Answered Comment: quit appox 36 years ago   Clinical Intake:  Pre-visit preparation completed: Yes  Pain : No/denies pain  Diabetes: No  How often do you need to have someone help you when you read instructions, pamphlets, or other written materials from your doctor or pharmacy?: 1 - Never  Interpreter Needed?: No  Information entered by :: CDenman GeorgeLPN  Past Medical History:  Diagnosis Date  . Anemia, hemolytic ([place] 01/22/2011  .  Aortic stenosis   . 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 COPD (chronic obstructive pulmonary disease) ([place]   . DOE (dyspnea on exertion)   . Elevated bilirubin   . Fatty liver   . History of neck surgery   . Hyperlipidemia   . Hypertension   . Mild dilation of ascending aorta (HCC)   . OSA on CPAP   . PAF (paroxysmal atrial fibrillation) ([place]   . Pre-diabetes   . Umbilical hernia    Past Surgical History:  Procedure Laterality Date  . ADENOIDECTOMY    . HIATAL HERNIA REPAIR  2011  . NECK SURGERY  2005  . SKIN SURGERY     skin cancer  . TONSILLECTOMY    . TOTAL HIP ARTHROPLASTY Right 09/18/2010   Dr. AMaureen Ralphs  Family History  Problem Relation Age of Onset  . Coronary artery disease Mother   . Hypertension Mother   . Heart attack Mother   . Coronary artery disease Father   . Hypertension Father   . Heart attack Father   . Diabetes Sister   . Hypertension Sister   . Diabetes Brother   . Cancer Brother   . Colon cancer Neg Hx   . Colonic polyp Neg Hx    Social History   Socioeconomic History  . Marital status: Married    Spouse name: Not on file  . Number of children: Not on file  . Years of education: Not  on file  . Highest education level: Not on file  Occupational History  . Occupation: Architectural technologist: [employer]  Social Needs  . Financial resource strain: Not on file  . Food insecurity    Worry: Not on file    Inability: Not on file  . Transportation needs    Medical: Not on file    Non-medical: Not on file  Tobacco Use  . Smoking status: Former Smoker    Packs/day: 1.50    Years: 3.00    Pack years: 4.50    Types: Cigarettes, Pipe    Quit date: 03/12/1973    Years since quitting: 45.8  . Smokeless tobacco: Never Used  . Tobacco comment: quit appox 36 years ago  Substance and Sexual Activity  . Alcohol use: Yes    Comment: occasionally, 1-7 drinks per week  . Drug use: No  . Sexual activity: Yes  Lifestyle  . Physical activity    Days  per week: Not on file    Minutes per session: Not on file  . Stress: Not on file  Relationships  . Social Herbalist on phone: Not on file    Gets together: Not on file    Attends religious service: Not on file    Active member of club or organization: Not on file    Attends meetings of clubs or organizations: Not on file    Relationship status: Not on file  Other Topics Concern  . Not on file  Social History Narrative  . Not on file    Outpatient Encounter Medications as of 01/14/2019  Medication Sig  . acetaminophen (TYLENOL) 650 MG CR tablet Take 650 mg by mouth 2 (two) times daily as needed for pain.   Marland Kitchen atorvastatin (LIPITOR) 20 MG tablet TAKE 1 TABLET BY MOUTH DAILY  . cloNIDine (CATAPRES) 0.3 MG tablet TAKE 1 TABLET BY MOUTH TWICE DAILY  . diltiazem (CARDIZEM CD) 180 MG 24 hr capsule TAKE 2 CAPSULES BY MOUTH EVERY DAY  . Fexofenadine HCl (ALLEGRA PO) Take 1 tablet by mouth every morning.   . folic acid (FOLVITE) 938 MCG tablet Take 400 mcg by mouth 2 (two) times daily.  . furosemide (LASIX) 40 MG tablet TAKE 1 TABLET BY MOUTH TWICE DAILY( AT 8:00 AM AND 1 TABLET AT 2:00 PM)  . metFORMIN (GLUCOPHAGE) 1000 MG tablet TAKE 1 TABLET BY MOUTH TWICE DAILY WITH A MEAL  . metoprolol succinate (TOPROL-XL) 50 MG 24 hr tablet TAKE 1 TABLET BY MOUTH TWICE DAILY  . Multiple Vitamin (MULTIVITAMIN) capsule Take 1 capsule by mouth daily.    . Multiple Vitamins-Minerals (EMERGEN-C IMMUNE PO) Take 1 tablet by mouth daily.   . Omega-3 Fatty Acids (OMEGA 3 500) 500 MG CAPS Take 500 capsules by mouth daily.  . potassium chloride SA (KLOR-CON) 20 MEQ tablet TAKE 1 TABLET BY MOUTH TWICE DAILY  . Psyllium (METAMUCIL FIBER PO) Take 1 Dose by mouth daily.   . tamsulosin (FLOMAX) 0.4 MG CAPS capsule TAKE 1 CAPSULE BY MOUTH EVERY DAY  . warfarin (COUMADIN) 5 MG tablet TAKE 1/2 TABLET BY MOUTH DAILY EXCEPT 1 TABLET ON MONDAY, WEDNESDAY, FRIDAY OR AS DIRECTED BY COUMADIN CLINIC   No  facility-administered encounter medications on file as of 01/14/2019.     Activities of Daily Living In your present state of health, do you have any difficulty performing the following activities: 01/14/2019  Hearing? N  Vision? N  Difficulty concentrating or making decisions?  N  Walking or climbing stairs? N  Dressing or bathing? N  Doing errands, shopping? N  Preparing Food and eating ? N  Using the Toilet? N  In the past six months, have you accidently leaked urine? N  Do you have problems with loss of bowel control? N  Managing your Medications? N  Managing your Finances? N  Housekeeping or managing your Housekeeping? N  Some recent data might be hidden    Patient Care Team: Orma Flaming, MD as PCP - General (Family Medicine) Dorothy Spark, MD as PCP - Cardiology (Cardiology) Sharyne Peach, MD as Consulting Physician (Ophthalmology) Dorothy Spark, MD as Consulting Physician (Cardiology) Jovita Gamma, MD as Consulting Physician (Neurosurgery) Laurence Spates, MD as Consulting Physician (Gastroenterology) Martinique, Amy, MD as Consulting Physician (Dermatology) Rigoberto Noel, MD as Consulting Physician (Pulmonary Disease) Volanda Napoleon, MD as Consulting Physician (Oncology)   Assessment:   This is a routine wellness examination for [facility].  Exercise Activities and Dietary recommendations Current Exercise Habits: Home exercise routine, Type of exercise: walking, Time (Minutes): 30, Frequency (Times/Week): 5, Weekly Exercise (Minutes/Week): 150, Intensity: Mild  Goals    . Reduce salt intake to 2 grams per day or less    . Weight (lb) < 200 lb (90.7 kg)     Would like to lose weight to get to around 220lbs       Fall Risk Fall Risk  01/14/2019 01/27/2018 01/08/2018 01/01/2018 12/25/2016  Falls in the past year? 0 0 No No No  Comment - Emmi Telephone Survey: data to providers prior to load - - -  Injury with Fall? 0 - - - -  Follow up Falls evaluation  completed;Education provided;Falls prevention discussed - - - -   Is the patient's home free of loose throw rugs in walkways, pet beds, electrical cords, etc?   yes      Grab bars in the bathroom? yes      Handrails on the stairs?   yes      Adequate lighting?   yes  Timed Get Up and Go Performed: completed and within normal timeframe; no gait abnormalities noted   Depression Screen PHQ 2/9 Scores 01/14/2019 01/08/2018 01/01/2018 12/25/2016  PHQ - 2 Score 0 0 0 0    Cognitive Function     6CIT Screen 01/14/2019 01/08/2018  What Year? 0 points 0 points  What month? 0 points 0 points  What time? 0 points 0 points  Count back from 20 0 points 0 points  Months in reverse 0 points 0 points  Repeat phrase 0 points 0 points  Total Score 0 0    Immunization History  Administered Date(s) Administered  . Fluad Quad(high Dose 65+) 12/17/2018  . Influenza Split 12/11/2011, 12/10/2012  . Influenza, High Dose Seasonal PF 01/09/2012, 01/09/2013, 01/11/2014, 12/14/2015, 01/01/2018  . Influenza-Unspecified 01/09/2012, 01/09/2013, 12/10/2013, 01/11/2014, 12/14/2015, 11/25/2016  . Pneumococcal Conjugate-13 11/15/2015  . Pneumococcal Polysaccharide-23 09/09/2009, 06/07/2010, 01/11/2018  . Tdap 11/20/2005, 01/11/2018  . Zoster 03/12/2010    Qualifies for Shingles Vaccine? Discussed and patient will check with pharmacy for coverage.  Patient education handout provided   Screening Tests Health Maintenance  Topic Date Due  . HEMOGLOBIN A1C  07/03/2018  . FOOT EXAM  01/02/2019  . OPHTHALMOLOGY EXAM  11/11/2019  . COLONOSCOPY  03/12/2021  . TETANUS/TDAP  01/12/2028  . INFLUENZA VACCINE  Completed  . Hepatitis C Screening  Completed  . PNA vac Low Risk Adult  Completed  Cancer Screenings: Lung: Low Dose CT Chest recommended if Age 47-80 years, 30 pack-year currently smoking OR have quit w/in 15years. Patient does not qualify. Colorectal: colonoscopy 12/06/11    Plan:  I have personally  reviewed and addressed the Medicare Annual Wellness questionnaire and have noted the following in the patient's chart:  A. Medical and social history B. Use of alcohol, tobacco or illicit drugs  C. Current medications and supplements D. Functional ability and status E.  Nutritional status F.  Physical activity G. Advance directives H. List of other physicians I.  Hospitalizations, surgeries, and ER visits in previous 12 months J.  [place] such as hearing and vision if needed, cognitive and depression L. Referrals, records requested, and appointments- none   In addition, I have reviewed and discussed with patient certain preventive protocols, quality metrics, and best practice recommendations. A written personalized care plan for preventive services as well as general preventive health recommendations were provided to patient.   Signed,  Denman George, LPN  Nurse Health Advisor   Nurse Notes: no additional

## 2019-01-14 NOTE — Progress Notes (Signed)
I have reviewed the documentation from the recent AWV done by Denman George; I agree with the documentation and will follow up on any recommendations or abnormal findings as suggested. Orma Flaming, MD [place]

## 2019-01-14 NOTE — Patient Instructions (Signed)
-  you're doing great. Will likely decrease your metformin down as you are extremely tightly controlled for your age.   -see you back in 6 months.   -so nice to meet you! Orma Flaming, MD [place]

## 2019-01-14 NOTE — Patient Instructions (Signed)
Steven Shepard , Thank you for taking time to come for your Medicare Wellness Visit. I appreciate your ongoing commitment to your health goals. Please review the following plan we discussed and let me know if I can assist you in the future.   Screening recommendations/referrals: Colorectal Screening: up to date; colonoscopy last 12/06/11  Vision and Dental Exams: Recommended annual ophthalmology exams for early detection of glaucoma and other disorders of the eye Recommended annual dental exams for proper oral hygiene  Vaccinations: Influenza vaccine: completed 12/17/18 Pneumococcal vaccine: up to date; last 01/11/18 Tdap vaccine: up to date; last 01/11/18 Shingles vaccine: Please call your insurance company to determine your out of pocket expense for the Shingrix vaccine. You may receive this vaccine at your local pharmacy.  Advanced directives: Please bring a copy of your POA (Power of Attorney) and/or Living Will to your next appointment.  Goals: Recommend to drink at least 6-8 8oz glasses of water per day and consume a balanced diet rich in fresh fruits and vegetables.   Next appointment: Please schedule your Annual Wellness Visit with your Nurse Health Advisor in one year.  Preventive Care 72 Years and Older, Male Preventive care refers to lifestyle choices and visits with your health care provider that can promote health and wellness. What does preventive care include?  A yearly physical exam. This is also called an annual well check.  Dental exams once or twice a year.  Routine eye exams. Ask your health care provider how often you should have your eyes checked.  Personal lifestyle choices, including:  Daily care of your teeth and gums.  Regular physical activity.  Eating a healthy diet.  Avoiding tobacco and drug use.  Limiting alcohol use.  Practicing safe sex.  Taking low doses of aspirin every day if recommended by your health care provider..  Taking vitamin and  mineral supplements as recommended by your health care provider. What happens during an annual well check? The services and screenings done by your health care provider during your annual well check will depend on your age, overall health, lifestyle risk factors, and family history of disease. Counseling  Your health care provider may ask you questions about your:  Alcohol use.  Tobacco use.  Drug use.  Emotional well-being.  Home and relationship well-being.  Sexual activity.  Eating habits.  History of falls.  Memory and ability to understand (cognition).  Work and work Statistician. Screening  You may have the following tests or measurements:  Height, weight, and BMI.  Blood pressure.  Lipid and cholesterol levels. These may be checked every 5 years, or more frequently if you are over 5 years old.  Skin check.  Lung cancer screening. You may have this screening every year starting at age 72 if you have a 30-pack-year history of smoking and currently smoke or have quit within the past 15 years.  Fecal occult blood test (FOBT) of the stool. You may have this test every year starting at age 72.  Flexible sigmoidoscopy or colonoscopy. You may have a sigmoidoscopy every 5 years or a colonoscopy every 10 years starting at age 72.  Prostate cancer screening. Recommendations will vary depending on your family history and other risks.  Hepatitis C blood test.  Hepatitis B blood test.  Sexually transmitted disease (STD) testing.  Diabetes screening. This is done by checking your blood sugar (glucose) after you have not eaten for a while (fasting). You may have this done every 1-3 years.  Abdominal aortic aneurysm (  AAA) screening. You may need this if you are a current or former smoker.  Osteoporosis. You may be screened starting at age 72 if you are at high risk. Talk with your health care provider about your test results, treatment options, and if necessary, the need  for more tests. Vaccines  Your health care provider may recommend certain vaccines, such as:  Influenza vaccine. This is recommended every year.  Tetanus, diphtheria, and acellular pertussis (Tdap, Td) vaccine. You may need a Td booster every 10 years.  Zoster vaccine. You may need this after age 72.  Pneumococcal 13-valent conjugate (PCV13) vaccine. One dose is recommended after age 72.  Pneumococcal polysaccharide (PPSV23) vaccine. One dose is recommended after age 72. Talk to your health care provider about which screenings and vaccines you need and how often you need them. This information is not intended to replace advice given to you by your health care provider. Make sure you discuss any questions you have with your health care provider. Document Released: 03/25/2015 Document Revised: 11/16/2015 Document Reviewed: 12/28/2014 Elsevier Interactive Patient Education  2017 [place] Prevention in the Home Falls can cause injuries. They can happen to people of all ages. There are many things you can do to make your home safe and to help prevent falls. What can I do on the outside of my home?  Regularly fix the edges of walkways and driveways and fix any cracks.  Remove anything that might make you trip as you walk through a door, such as a raised step or threshold.  Trim any bushes or trees on the path to your home.  Use bright outdoor lighting.  Clear any walking paths of anything that might make someone trip, such as rocks or tools.  Regularly check to see if handrails are loose or broken. Make sure that both sides of any steps have handrails.  Any raised decks and porches should have guardrails on the edges.  Have any leaves, snow, or ice cleared regularly.  Use sand or salt on walking paths during winter.  Clean up any spills in your garage right away. This includes oil or grease spills. What can I do in the bathroom?  Use night lights.  Install grab bars  by the toilet and in the tub and shower. Do not use towel bars as grab bars.  Use non-skid mats or decals in the tub or shower.  If you need to sit down in the shower, use a plastic, non-slip stool.  Keep the floor dry. Clean up any water that spills on the floor as soon as it happens.  Remove soap buildup in the tub or shower regularly.  Attach bath mats securely with double-sided non-slip rug tape.  Do not have throw rugs and other things on the floor that can make you trip. What can I do in the bedroom?  Use night lights.  Make sure that you have a light by your bed that is easy to reach.  Do not use any sheets or blankets that are too big for your bed. They should not hang down onto the floor.  Have a firm chair that has side arms. You can use this for support while you get dressed.  Do not have throw rugs and other things on the floor that can make you trip. What can I do in the kitchen?  Clean up any spills right away.  Avoid walking on wet floors.  Keep items that you use a lot in  easy-to-reach places.  If you need to reach something above you, use a strong step stool that has a grab bar.  Keep electrical cords out of the way.  Do not use floor polish or wax that makes floors slippery. If you must use wax, use non-skid floor wax.  Do not have throw rugs and other things on the floor that can make you trip. What can I do with my stairs?  Do not leave any items on the stairs.  Make sure that there are handrails on both sides of the stairs and use them. Fix handrails that are broken or loose. Make sure that handrails are as long as the stairways.  Check any carpeting to make sure that it is firmly attached to the stairs. Fix any carpet that is loose or worn.  Avoid having throw rugs at the top or bottom of the stairs. If you do have throw rugs, attach them to the floor with carpet tape.  Make sure that you have a light switch at the top of the stairs and the  bottom of the stairs. If you do not have them, ask someone to add them for you. What else can I do to help prevent falls?  Wear shoes that:  Do not have high heels.  Have rubber bottoms.  Are comfortable and fit you well.  Are closed at the toe. Do not wear sandals.  If you use a stepladder:  Make sure that it is fully opened. Do not climb a closed stepladder.  Make sure that both sides of the stepladder are locked into place.  Ask someone to hold it for you, if possible.  Clearly mark and make sure that you can see:  Any grab bars or handrails.  First and last steps.  Where the edge of each step is.  Use tools that help you move around (mobility aids) if they are needed. These include:  Canes.  Walkers.  Scooters.  Crutches.  Turn on the lights when you go into a dark area. Replace any light bulbs as soon as they burn out.  Set up your furniture so you have a clear path. Avoid moving your furniture around.  If any of your floors are uneven, fix them.  If there are any pets around you, be aware of where they are.  Review your medicines with your doctor. Some medicines can make you feel dizzy. This can increase your chance of falling. Ask your doctor what other things that you can do to help prevent falls. This information is not intended to replace advice given to you by your health care provider. Make sure you discuss any questions you have with your health care provider. Document Released: 12/23/2008 Document Revised: 08/04/2015 Document Reviewed: 04/02/2014 Elsevier Interactive Patient Education  2017 [employer].

## 2019-01-14 NOTE — Progress Notes (Signed)
Patient: Steven Shepard MRN: [id number] DOB: 04-Dec-1945 PCP: Orma Flaming, MD     Subjective:  Chief Complaint  Patient presents with  . Transitions Of Care  . Hypertension  . Hyperlipidemia  . Diabetes    HPI: The patient is a 73 y.o. male who presents today for transition of care from Dr. Juleen China  Hypertension: Here for follow up of hypertension.  Currently on catapres,cardizem,toprol-xl . Takes medication as prescribed and denies any side effects. Exercise includes walking. Weight has been stable. Denies any chest pain, headaches, shortness of breath, vision changes, swelling in lower extremities.   PREDiabetes: Patient is here for follow up of type 2 diabetes; however, I have not found an a1c of 6.5 in his labs going back ot 2015 and he was told he was diagnosed a year or two ago. First diagnosed maybe a year ago. Was told her was prediabetic. a1c from 2015 was 5.3. highest reading here I can find is 6.3.  Currently on the following medications: metformin 1000 mg BID. Takes medications as prescribed. Last A1C was 5.5 . Currently exercising and following diabetic diet. Denies any hypoglycemic events. Denies any vision changes, nausea, vomiting, abdominal pain, ulcers/paraesthesia in feet, polyuria, polydipsia or polyphagia. Denies any chest pain, shortness of breath.   Hyperlipidemia: currently on lipitor. No hx of stents. Mom and dad both died of MI. Sister has had a MI. Past hx of smoking, quit in 76 and hx of diabetes, but extremely well controlled.   Review of Systems  Constitutional: Negative for chills, fatigue and fever.  HENT: Positive for rhinorrhea. Negative for congestion, dental problem, ear pain, hearing loss, postnasal drip, sore throat and trouble swallowing.   Eyes: Negative for visual disturbance.  Respiratory: Negative for cough, chest tightness and shortness of breath.   Cardiovascular: Negative for chest pain, palpitations and leg swelling.   Gastrointestinal: Negative for abdominal pain, blood in stool, constipation, diarrhea, nausea and vomiting.  Endocrine: Negative for cold intolerance, polydipsia, polyphagia and polyuria.  Genitourinary: Negative for dysuria and hematuria.  Musculoskeletal: Negative for arthralgias.  Skin: Negative for rash.  Neurological: Negative for dizziness and headaches.  Psychiatric/Behavioral: Negative for dysphoric mood and sleep disturbance. The patient is not nervous/anxious.     Allergies Patient is allergic to ace inhibitors; codeine; and spironolactone.  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  Surgical History Patient  has a past surgical history that includes Tonsillectomy; Skin surgery; Neck surgery (2005); Total hip arthroplasty (Right, 09/18/2010); Hiatal hernia repair (2011); and Adenoidectomy.  Family History Pateint's family history includes Cancer in his brother; Coronary artery disease in his father and mother; Diabetes in his brother and sister; Heart attack in his father and mother; Hypertension in his father, mother, and sister.  Social History Patient  reports that he quit smoking about 45 years ago. His smoking use included cigarettes and pipe. He has a 4.50 pack-year smoking history. He has never used smokeless tobacco. He reports current alcohol use. He reports that he does not use drugs.    Objective: Vitals:   01/14/19 1307  BP: 123/72  Pulse: (!) 6  Temp: 97.8 F (36.6 C)  TempSrc: Skin  SpO2: 96%  Weight: 227  lb 9.6 oz (103.2 kg)  Height: 6' 1"  (1.854 m)     Body mass index is 30.03 kg/m.  Physical Exam Vitals signs reviewed.  Constitutional:      Appearance: Normal appearance. He is well-developed and normal weight.  HENT:     Head: Normocephalic and atraumatic.     Right Ear: Tympanic membrane, ear canal and external ear normal.     Left Ear: External ear normal. There is impacted cerumen.     Mouth/Throat:     Mouth: Mucous membranes are moist.  Eyes:     Extraocular Movements: Extraocular movements intact.     Conjunctiva/sclera: Conjunctivae normal.     Pupils: Pupils are equal, round, and reactive to light.  Neck:     Musculoskeletal: Normal range of motion and neck supple.     Thyroid: No thyromegaly.  Cardiovascular:     Rate and Rhythm: Normal rate and regular rhythm.     Heart sounds: Murmur (harsh systolic) present.  Pulmonary:     Effort: Pulmonary effort is normal.     Breath sounds: Normal breath sounds.  Abdominal:     General: Abdomen is flat. Bowel sounds are normal. There is no distension.     Palpations: Abdomen is soft.     Tenderness: There is no abdominal tenderness.  Musculoskeletal:     Right lower leg: Edema present.     Left lower leg: Edema present.  Lymphadenopathy:     Cervical: No cervical adenopathy.  Skin:    General: Skin is warm and dry.     Findings: No rash.  Neurological:     General: No focal deficit present.     Mental Status: He is alert and oriented to person, place, and time.     Cranial Nerves: No cranial nerve deficit.     Coordination: Coordination normal.     Deep Tendon Reflexes: Reflexes normal.  Psychiatric:        Behavior: Behavior normal.        Assessment/plan: 1. Essential hypertension Blood pressure is to goal. Continue current anti-hypertensive medications. Refills not given and routine lab work will be done today. Recommended routine exercise and healthy diet including DASH diet and mediterranean diet. Encouraged weight loss. F/u in 6 months.   -  Comprehensive metabolic panel - CBC with Differential/Platelet  2. Benign non-nodular prostatic hyperplasia  - PSA  3. Mixed hyperlipidemia  - Lipid panel  4. prediabetes Can not find where his a1c was ever 6.5. looks like he is just prediabetic. Extremely tightly controlled with last a1c of 5.5. discussed current guidelines with his age and tight glycemic control. Will likely decrease his metformin dose with hopes to stop it. He is on board. Will check labs today and go from there. Continue with diabetic diet and f/u in 6 months. utd on HM>  - Hemoglobin A1c - TSH - Microalbumin / creatinine urine ratio   -known aortic stenosis. Just had echo. Moderate. Followed by cards.  -HM utd. Has medicare annual today.    Return in about 6 months (around 07/14/2019) for routine f/u of htn/diabetes. Orma Flaming, MD [place]   01/14/2019

## 2019-01-15 ENCOUNTER — Encounter: Payer: Self-pay | Admitting: Family Medicine

## 2019-01-15 ENCOUNTER — Other Ambulatory Visit: Payer: Self-pay | Admitting: Family Medicine

## 2019-01-15 DIAGNOSIS — R7303 Prediabetes: Secondary | ICD-10-CM

## 2019-01-15 MED ORDER — METFORMIN HCL 1000 MG PO TABS: 500.0000 mg | ORAL_TABLET | Freq: Two times a day (BID) | ORAL | 3 refills | Status: DC

## 2019-01-16 ENCOUNTER — Telehealth: Payer: Self-pay

## 2019-01-16 NOTE — Telephone Encounter (Signed)
Spoke to patient and further explained lab results since he had multiple questions regarding Dr. Shelby Mattocks MyChart message about labs.  Patient verbalized understanding.

## 2019-01-21 ENCOUNTER — Other Ambulatory Visit: Payer: Self-pay

## 2019-01-21 ENCOUNTER — Ambulatory Visit (INDEPENDENT_AMBULATORY_CARE_PROVIDER_SITE_OTHER): Payer: Medicare Other | Admitting: General Practice

## 2019-01-21 DIAGNOSIS — Z7901 Long term (current) use of anticoagulants: Secondary | ICD-10-CM

## 2019-01-21 LAB — POCT INR: INR: 3 (ref 2.0–3.0)

## 2019-01-21 NOTE — Patient Instructions (Addendum)
Pre visit review using our clinic review tool, if applicable. No additional management support is needed unless otherwise documented below in the visit note.  Continue to take 1/2 tablet daily except take 1 tablet on Monday/Wed/Fridays and recheck in 6 weeks.

## 2019-01-28 ENCOUNTER — Other Ambulatory Visit: Payer: Self-pay | Admitting: Family Medicine

## 2019-01-28 ENCOUNTER — Other Ambulatory Visit: Payer: Self-pay | Admitting: Cardiology

## 2019-01-29 NOTE — Telephone Encounter (Signed)
Last OV 01/14/19 Last refill 09/29/18 #90/0 Next OV 07/15/19

## 2019-02-04 ENCOUNTER — Other Ambulatory Visit: Payer: Self-pay

## 2019-02-20 DIAGNOSIS — L570 Actinic keratosis: Secondary | ICD-10-CM | POA: Diagnosis not present

## 2019-02-20 DIAGNOSIS — Z85828 Personal history of other malignant neoplasm of skin: Secondary | ICD-10-CM | POA: Diagnosis not present

## 2019-02-20 DIAGNOSIS — D2261 Melanocytic nevi of right upper limb, including shoulder: Secondary | ICD-10-CM | POA: Diagnosis not present

## 2019-02-20 DIAGNOSIS — D225 Melanocytic nevi of trunk: Secondary | ICD-10-CM | POA: Diagnosis not present

## 2019-02-20 DIAGNOSIS — Z85820 Personal history of malignant melanoma of skin: Secondary | ICD-10-CM | POA: Diagnosis not present

## 2019-02-20 DIAGNOSIS — L814 Other melanin hyperpigmentation: Secondary | ICD-10-CM | POA: Diagnosis not present

## 2019-02-20 DIAGNOSIS — L821 Other seborrheic keratosis: Secondary | ICD-10-CM | POA: Diagnosis not present

## 2019-02-25 ENCOUNTER — Ambulatory Visit (INDEPENDENT_AMBULATORY_CARE_PROVIDER_SITE_OTHER): Payer: Medicare Other | Admitting: General Practice

## 2019-02-25 ENCOUNTER — Other Ambulatory Visit: Payer: Self-pay

## 2019-02-25 DIAGNOSIS — Z7901 Long term (current) use of anticoagulants: Secondary | ICD-10-CM

## 2019-02-25 LAB — POCT INR: INR: 2.8 (ref 2.0–3.0)

## 2019-02-25 NOTE — Patient Instructions (Addendum)
Pre visit review using our clinic review tool, if applicable. No additional management support is needed unless otherwise documented below in the visit note.  Continue to take 1/2 tablet daily except take 1 tablet on Monday/Wed/Fridays and recheck in 6 weeks.

## 2019-03-04 ENCOUNTER — Other Ambulatory Visit: Payer: Self-pay | Admitting: Physician Assistant

## 2019-03-04 DIAGNOSIS — I5031 Acute diastolic (congestive) heart failure: Secondary | ICD-10-CM

## 2019-03-04 DIAGNOSIS — I480 Paroxysmal atrial fibrillation: Secondary | ICD-10-CM

## 2019-03-30 ENCOUNTER — Other Ambulatory Visit: Payer: Self-pay | Admitting: Family Medicine

## 2019-03-30 MED ORDER — ATORVASTATIN CALCIUM 20 MG PO TABS: 20.0000 mg | ORAL_TABLET | Freq: Every day | ORAL | 3 refills | Status: DC

## 2019-04-08 ENCOUNTER — Ambulatory Visit (INDEPENDENT_AMBULATORY_CARE_PROVIDER_SITE_OTHER): Payer: Medicare Other | Admitting: General Practice

## 2019-04-08 ENCOUNTER — Other Ambulatory Visit: Payer: Self-pay

## 2019-04-08 DIAGNOSIS — Z7901 Long term (current) use of anticoagulants: Secondary | ICD-10-CM

## 2019-04-08 LAB — POCT INR: INR: 2.5 (ref 2.0–3.0)

## 2019-04-08 NOTE — Patient Instructions (Signed)
Pre visit review using our clinic review tool, if applicable. No additional management support is needed unless otherwise documented below in the visit note.  Continue to take 1/2 tablet daily except take 1 tablet on Monday/Wed/Fridays and recheck in 6 weeks.

## 2019-04-16 ENCOUNTER — Other Ambulatory Visit: Payer: Self-pay | Admitting: Family Medicine

## 2019-04-16 DIAGNOSIS — Z7901 Long term (current) use of anticoagulants: Secondary | ICD-10-CM

## 2019-04-16 NOTE — Telephone Encounter (Signed)
Please review

## 2019-04-16 NOTE — Telephone Encounter (Signed)
Last OV 01/14/19 Last refill 11/07/18 #30/3 Next OV 07/15/19

## 2019-04-21 NOTE — Progress Notes (Signed)
Cardiology Office Note    Date:  04/23/2019   ID:  Steven Shepard, DOB 10/26/1945, MRN [id number]  PCP:  Orma Flaming, MD  Cardiologist:  Ena Dawley, MD  Electrophysiologist:  None   Chief Complaint: f/u afib (6 months)  History of Present Illness:   Steven Shepard is a 74 y.o. male with history of PAF, HTN, HLD (followed by PCP), prior moderate ETOH (now sporadic), moderate aortic stenosis, mildly dilated ascending aorta, edema felt 2/2 chronic diastolic CHF, hemolytic anemia, pre-DM, cold agglutinin disease, COPD, OSA on CPAP who presents for 6 month f/u. He was remotely followed by Dr. Verl Blalock and more recently Dr. Meda Coffee. In 2011 he was admitted for acute kidney insufficiency and hyperkalemia >7.5 in setting of spironolactone and benazepril which were stopped. LE venous duplex 2012 was negative for DVT. Had normal ABIs 2015. He has not required prior ischemic evaluation. Prior RUQ [place] 2018 showed fatty infiltration without definite cirrhotic changes. He is followed by Dr. Oletta Lamas with GI. His cold agglutinin disease was discovered incidentally during pre-op bloodwork for hip surgery several years ago. Traditionally he has remained on Coumadin instead of a NOAC because he was told we know for sure that Coumadin works in the setting of cold agglutinin disease. He has learned to limit ETOH intake as this had triggered events in the past. His last echo following that visit 11/2017 showed EF 60-65%, grade 1 DD, moderate aortic stenosis, mildly dilated ascending aorta. Last labs 08/2018 showed K 4.1, Cr 0.94, normal AST/ALT, TBili 1.6 (similar to prior), Hgb 12.3, plt 207.  04/22/2019 - the patient is coming after 6 months, he has been doing well, denies any chest pain or shortness of breath, no palpitation dizziness or syncope.  He has been compliant with his medications, has no myalgias and no bleeding.  He has no dyspnea on exertion and no orthostatic hypotension.   Past Medical History:   Diagnosis Date  . Anemia, hemolytic ([place]) 01/22/2011  . Aortic stenosis   . 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 COPD (chronic obstructive pulmonary disease) ([place])   . DOE (dyspnea on exertion)   . Elevated bilirubin   . Fatty liver   . History of neck surgery   . Hyperlipidemia   . Hypertension   . Mild dilation of ascending aorta (HCC)   . OSA on CPAP   . PAF (paroxysmal atrial fibrillation) ([place])   . Pre-diabetes   . Umbilical hernia     Past Surgical History:  Procedure Laterality Date  . ADENOIDECTOMY    . HIATAL HERNIA REPAIR  2011  . NECK SURGERY  2005  . SKIN SURGERY     skin cancer  . TONSILLECTOMY    . TOTAL HIP ARTHROPLASTY Right 09/18/2010   Dr. Maureen Ralphs    Current Medications: Current Meds  Medication Sig  . acetaminophen (TYLENOL) 650 MG CR tablet Take 650 mg by mouth 2 (two) times daily as needed for pain.   Marland Kitchen atorvastatin (LIPITOR) 20 MG tablet Take 1 tablet (20 mg total) by mouth daily.  . cloNIDine (CATAPRES) 0.3 MG tablet TAKE 1 TABLET BY MOUTH TWICE DAILY  . diltiazem (CARDIZEM CD) 180 MG 24 hr capsule TAKE 2 CAPSULES BY MOUTH EVERY DAY  . Fexofenadine HCl (ALLEGRA PO) Take 1 tablet by mouth every morning.   . folic acid (FOLVITE) 833 MCG tablet Take 400 mcg by mouth 2 (two) times daily.  . furosemide (LASIX) 40 MG tablet TAKE 1 TABLET BY MOUTH TWICE DAILY(AT  8AM AND 2PM)  . metFORMIN (GLUCOPHAGE) 1000 MG tablet Take 0.5 tablets (500 mg total) by mouth 2 (two) times daily with a meal.  . metoprolol succinate (TOPROL-XL) 50 MG 24 hr tablet TAKE 1 TABLET BY MOUTH TWICE DAILY  . Multiple Vitamin (MULTIVITAMIN) capsule Take 1 capsule by mouth daily.    . Multiple Vitamins-Minerals (EMERGEN-C IMMUNE PO) Take 1 tablet by mouth daily.   . Omega-3 Fatty Acids (OMEGA 3 500) 500 MG CAPS Take 500 capsules by mouth daily.  . potassium chloride SA (KLOR-CON) 20 MEQ tablet TAKE 1 TABLET BY MOUTH TWICE DAILY  . Psyllium (METAMUCIL FIBER PO) Take 1 Dose by mouth  daily.   . tamsulosin (FLOMAX) 0.4 MG CAPS capsule TAKE 1 CAPSULE BY MOUTH EVERY DAY  . warfarin (COUMADIN) 5 MG tablet TAKE 1/2 TABLET BY MOUTH DAILY EXCEPT 1 TABLET ON MONDAY, WEDNESDAY AND FRIDAY OR AS DIRECTED BY COUMADIN CLINIC     Allergies:   Ace inhibitors, Codeine, and Spironolactone   Social History   Socioeconomic History  . Marital status: Married    Spouse name: Not on file  . Number of children: Not on file  . Years of education: Not on file  . Highest education level: Not on file  Occupational History  . Occupation: Architectural technologist: [employer]  Tobacco Use  . Smoking status: Former Smoker    Packs/day: 1.50    Years: 3.00    Pack years: 4.50    Types: Cigarettes, Pipe    Quit date: 03/12/1973    Years since quitting: 46.1  . Smokeless tobacco: Never Used  . Tobacco comment: quit appox 36 years ago  Substance and Sexual Activity  . Alcohol use: Yes    Comment: occasionally, 1-7 drinks per week  . Drug use: No  . Sexual activity: Yes  Other Topics Concern  . Not on file  Social History Narrative  . Not on file   Social Determinants of Health   Financial Resource Strain:   . Difficulty of Paying Living Expenses: Not on file  Food Insecurity:   . Worried About Charity fundraiser in the Last Year: Not on file  . Ran Out of Food in the Last Year: Not on file  Transportation Needs:   . Lack of Transportation (Medical): Not on file  . Lack of Transportation (Non-Medical): Not on file  Physical Activity:   . Days of Exercise per Week: Not on file  . Minutes of Exercise per Session: Not on file  Stress:   . Feeling of Stress : Not on file  Social Connections:   . Frequency of Communication with Friends and Family: Not on file  . Frequency of Social Gatherings with Friends and Family: Not on file  . Attends Religious Services: Not on file  . Active Member of Clubs or Organizations: Not on file  . Attends Archivist Meetings: Not on file  .  Marital Status: Not on file     Family History:  The patient's family history includes Cancer in his brother; Coronary artery disease in his father and mother; Diabetes in his brother and sister; Heart attack in his father and mother; Hypertension in his father, mother, and sister. There is no history of Colon cancer or Colonic polyp.  ROS:   Please see the history of present illness.   All other systems are reviewed and otherwise negative.    EKGs/Labs/Other Studies Reviewed:    Studies reviewed were  summarized above.   EKG:  EKG is ordered today, personally reviewed, demonstrating NSR 64bpm, no acute STT changes  Recent Labs: 08/19/2018: Hemoglobin 12.3; Platelet Count 207 01/14/2019: ALT 20; BUN 20; Creatinine, Ser 0.87; Potassium 4.2; Sodium 139; TSH 3.85  Recent Lipid Panel    Component Value Date/Time   CHOL 143 01/14/2019 1325   TRIG 194.0 (H) 01/14/2019 1325   HDL 38.10 (L) 01/14/2019 1325   CHOLHDL 4 01/14/2019 1325   VLDL 38.8 01/14/2019 1325   LDLCALC 66 01/14/2019 1325    PHYSICAL EXAM:    VS:  BP 130/72   Pulse (!) 58   Ht 6' 1"  (1.854 m)   Wt 232 lb (105.2 kg)   SpO2 96%   BMI 30.61 kg/m   BMI: Body mass index is 30.61 kg/m.  GEN: Well nourished, well developed WM, in no acute distress HEENT: normocephalic, atraumatic Neck: no JVD, carotid bruits, or masses Cardiac: RRR; 2/6 SEM, preserved but quiet S2, no rubs or gallops, no edema  Respiratory:  clear to auscultation bilaterally, normal work of breathing GI: soft, nontender, nondistended, + BS MS: no deformity or atrophy Skin: warm and dry, no rash Neuro:  Alert and Oriented x 3, Strength and sensation are intact, follows commands Psych: euthymic mood, full affect  Wt Readings from Last 3 Encounters:  04/23/19 232 lb (105.2 kg)  01/14/19 227 lb 8.2 oz (103.2 kg)  01/14/19 227 lb 9.6 oz (103.2 kg)     ASSESSMENT & PLAN:   1. Paroxysmal atrial fibrillation - maintaining NSR. Continue Coumadin, he  has no bleeding and is asymptomatic. 2. HTN -controlled 3. Moderate aortic stenosis -most recent echo in September 2020 showed mean gradient of 17 mmHg.  He is asymptomatic, will repeat his echocardiogram again in September 2021. 4. Mildly dilated ascending aorta -upper normal, 39 mm in September 2020.   5. Chronic diastolic CHF - appears euvolemic. Recent labs 08/2018 stable. 6. Hyperlipidemia -atorvastatin is tolerated well.    Disposition: F/u with Dr. Meda Coffee in 6 months.  Echocardiogram to evaluate aortic stenosis degree prior to that appointment.  Medication Adjustments/Labs and Tests Ordered: Current medicines are reviewed at length with the patient today.  Concerns regarding medicines are outlined above. Medication changes, Labs and Tests ordered today are summarized above and listed in the Patient Instructions accessible in Encounters.   Signed, Ena Dawley, MD  04/23/2019 3:33 PM    [address], [place], [place]  [address] Phone: [phone]; Fax: [phone]

## 2019-04-23 ENCOUNTER — Ambulatory Visit (INDEPENDENT_AMBULATORY_CARE_PROVIDER_SITE_OTHER): Payer: Medicare Other | Admitting: Cardiology

## 2019-04-23 ENCOUNTER — Encounter: Payer: Self-pay | Admitting: Cardiology

## 2019-04-23 ENCOUNTER — Other Ambulatory Visit: Payer: Self-pay

## 2019-04-23 VITALS — BP 130/72 | HR 58 | Ht 73.0 in | Wt 232.0 lb

## 2019-04-23 DIAGNOSIS — I480 Paroxysmal atrial fibrillation: Secondary | ICD-10-CM

## 2019-04-23 DIAGNOSIS — I5032 Chronic diastolic (congestive) heart failure: Secondary | ICD-10-CM

## 2019-04-23 DIAGNOSIS — I10 Essential (primary) hypertension: Secondary | ICD-10-CM | POA: Diagnosis not present

## 2019-04-23 DIAGNOSIS — I350 Nonrheumatic aortic (valve) stenosis: Secondary | ICD-10-CM

## 2019-04-23 DIAGNOSIS — E785 Hyperlipidemia, unspecified: Secondary | ICD-10-CM

## 2019-04-23 DIAGNOSIS — I77810 Thoracic aortic ectasia: Secondary | ICD-10-CM

## 2019-04-23 NOTE — Patient Instructions (Signed)
Medication Instructions:   Your physician recommends that you continue on your current medications as directed. Please refer to the Current Medication list given to you today.  *If you need a refill on your cardiac medications before your next appointment, please call your pharmacy*   Testing/Procedures:  Your physician has requested that you have an echocardiogram. Echocardiography is a painless test that uses sound waves to create images of your heart. It provides your doctor with information about the size and shape of your heart and how well your heart's chambers and valves are working. This procedure takes approximately one hour. There are no restrictions for this procedure.  SCHEDULING PLEASE SCHEDULE THIS FOR 5 1/2 MONTHS OUT PER DR. Meda Coffee     Follow-Up: At [facility], you and your health needs are our priority.  As part of our continuing mission to provide you with exceptional heart care, we have created designated Provider Care Teams.  These Care Teams include your primary Cardiologist (physician) and Advanced Practice Providers (APPs -  Physician Assistants and Nurse Practitioners) who all work together to provide you with the care you need, when you need it.  Your next appointment:   6 month(s)  The format for your next appointment:   In Person  Provider:   Ena Dawley, MD

## 2019-05-20 ENCOUNTER — Ambulatory Visit (INDEPENDENT_AMBULATORY_CARE_PROVIDER_SITE_OTHER): Payer: Medicare Other | Admitting: General Practice

## 2019-05-20 ENCOUNTER — Other Ambulatory Visit: Payer: Self-pay

## 2019-05-20 DIAGNOSIS — Z7901 Long term (current) use of anticoagulants: Secondary | ICD-10-CM

## 2019-05-20 LAB — POCT INR: INR: 2.2 (ref 2.0–3.0)

## 2019-05-20 NOTE — Patient Instructions (Addendum)
Pre visit review using our clinic review tool, if applicable. No additional management support is needed unless otherwise documented below in the visit note.  Continue to take 1/2 tablet daily except take 1 tablet on Monday/Wed/Fridays and recheck in 6 weeks.

## 2019-05-27 DIAGNOSIS — D2261 Melanocytic nevi of right upper limb, including shoulder: Secondary | ICD-10-CM | POA: Diagnosis not present

## 2019-05-27 DIAGNOSIS — D692 Other nonthrombocytopenic purpura: Secondary | ICD-10-CM | POA: Diagnosis not present

## 2019-05-27 DIAGNOSIS — L820 Inflamed seborrheic keratosis: Secondary | ICD-10-CM | POA: Diagnosis not present

## 2019-05-27 DIAGNOSIS — Z85820 Personal history of malignant melanoma of skin: Secondary | ICD-10-CM | POA: Diagnosis not present

## 2019-05-27 DIAGNOSIS — Z85828 Personal history of other malignant neoplasm of skin: Secondary | ICD-10-CM | POA: Diagnosis not present

## 2019-05-27 DIAGNOSIS — L570 Actinic keratosis: Secondary | ICD-10-CM | POA: Diagnosis not present

## 2019-05-27 DIAGNOSIS — D2372 Other benign neoplasm of skin of left lower limb, including hip: Secondary | ICD-10-CM | POA: Diagnosis not present

## 2019-05-27 DIAGNOSIS — D225 Melanocytic nevi of trunk: Secondary | ICD-10-CM | POA: Diagnosis not present

## 2019-05-27 DIAGNOSIS — L814 Other melanin hyperpigmentation: Secondary | ICD-10-CM | POA: Diagnosis not present

## 2019-05-27 DIAGNOSIS — L821 Other seborrheic keratosis: Secondary | ICD-10-CM | POA: Diagnosis not present

## 2019-05-27 DIAGNOSIS — D1801 Hemangioma of skin and subcutaneous tissue: Secondary | ICD-10-CM | POA: Diagnosis not present

## 2019-07-01 ENCOUNTER — Other Ambulatory Visit: Payer: Self-pay

## 2019-07-01 ENCOUNTER — Ambulatory Visit (INDEPENDENT_AMBULATORY_CARE_PROVIDER_SITE_OTHER): Payer: Medicare Other | Admitting: General Practice

## 2019-07-01 DIAGNOSIS — Z7901 Long term (current) use of anticoagulants: Secondary | ICD-10-CM | POA: Diagnosis not present

## 2019-07-01 LAB — POCT INR: INR: 3.4 — AB (ref 2.0–3.0)

## 2019-07-01 NOTE — Patient Instructions (Signed)
Pre visit review using our clinic review tool, if applicable. No additional management support is needed unless otherwise documented below in the visit note.  Continue to take 1/2 tablet daily except take 1 tablet on Monday/Wed/Fridays and recheck in 4 weeks.

## 2019-07-09 ENCOUNTER — Encounter: Payer: Self-pay | Admitting: Family Medicine

## 2019-07-15 ENCOUNTER — Encounter: Payer: Self-pay | Admitting: Family Medicine

## 2019-07-15 ENCOUNTER — Other Ambulatory Visit: Payer: Self-pay | Admitting: Family Medicine

## 2019-07-15 ENCOUNTER — Other Ambulatory Visit: Payer: Self-pay | Admitting: Physician Assistant

## 2019-07-15 ENCOUNTER — Other Ambulatory Visit: Payer: Self-pay | Admitting: Cardiology

## 2019-07-15 ENCOUNTER — Other Ambulatory Visit (INDEPENDENT_AMBULATORY_CARE_PROVIDER_SITE_OTHER): Payer: Medicare Other

## 2019-07-15 ENCOUNTER — Ambulatory Visit (INDEPENDENT_AMBULATORY_CARE_PROVIDER_SITE_OTHER): Payer: Medicare Other | Admitting: Family Medicine

## 2019-07-15 ENCOUNTER — Other Ambulatory Visit: Payer: Self-pay

## 2019-07-15 VITALS — BP 112/70 | HR 68 | Temp 97.3°F | Ht 73.0 in | Wt 230.2 lb

## 2019-07-15 DIAGNOSIS — R7303 Prediabetes: Secondary | ICD-10-CM | POA: Diagnosis not present

## 2019-07-15 DIAGNOSIS — I480 Paroxysmal atrial fibrillation: Secondary | ICD-10-CM

## 2019-07-15 DIAGNOSIS — I5031 Acute diastolic (congestive) heart failure: Secondary | ICD-10-CM

## 2019-07-15 DIAGNOSIS — I10 Essential (primary) hypertension: Secondary | ICD-10-CM

## 2019-07-15 LAB — CBC WITH DIFFERENTIAL/PLATELET
Basophils Relative: 0 % (ref 0.0–3.0)
Eosinophils Relative: 0 % (ref 0.0–5.0)
Hemoglobin: 10.9 g/dL — ABNORMAL LOW (ref 13.0–17.0)
Lymphocytes Relative: 37 % (ref 12.0–46.0)
Monocytes Relative: 2 % — ABNORMAL LOW (ref 3.0–12.0)
Neutrophils Relative %: 61 % (ref 43.0–77.0)
WBC: 11.1 10*3/uL — ABNORMAL HIGH (ref 4.0–10.5)

## 2019-07-15 LAB — COMPREHENSIVE METABOLIC PANEL
ALT: 20 U/L (ref 0–53)
AST: 17 U/L (ref 0–37)
Albumin: 4 g/dL (ref 3.5–5.2)
Alkaline Phosphatase: 74 U/L (ref 39–117)
BUN: 16 mg/dL (ref 6–23)
CO2: 31 mEq/L (ref 19–32)
Calcium: 8.9 mg/dL (ref 8.4–10.5)
Chloride: 104 mEq/L (ref 96–112)
Creatinine, Ser: 0.83 mg/dL (ref 0.40–1.50)
GFR: 90.96 mL/min (ref >60.00)
Glucose, Bld: 104 mg/dL — ABNORMAL HIGH (ref 70–99)
Potassium: 3.9 mEq/L (ref 3.5–5.1)
Sodium: 141 mEq/L (ref 135–145)
Total Bilirubin: 1.6 mg/dL — ABNORMAL HIGH (ref 0.2–1.2)
Total Protein: 6.3 g/dL (ref 6.0–8.3)

## 2019-07-15 LAB — HEMOGLOBIN A1C: Hgb A1c MFr Bld: 5.7 % (ref 4.6–6.5)

## 2019-07-15 MED ORDER — FUROSEMIDE 40 MG PO TABS: ORAL_TABLET | ORAL | 1 refills | Status: DC

## 2019-07-15 NOTE — Progress Notes (Addendum)
In the future patient labs need to be drawn at [facility] lab due to clumping of blood from disease.

## 2019-07-15 NOTE — Progress Notes (Signed)
Patient: Steven Shepard MRN: [id number] DOB: 1946-02-24 PCP: Orma Flaming, MD     Subjective:  Chief Complaint  Patient presents with  . Hypertension    3mh f/u  . Prediabetes    HPI: The patient is a 74y.o. male who presents today for Hypertension and pre diabetes.  Hypertension Here for follow up of hypertension.  Currently on catapres .340mID ,cardizem 36018maily ,toprol-xl 63m41mD . Takes medication as prescribed and denies any side effects. Exercise includes walking. Weight has been stable. Denies any chest pain, headaches, shortness of breath, vision changes, swelling in lower extremities.   Prediabetes Patient is here for follow up of type 2 diabetes; however, I have not found an a1c of 6.5 in his labs going back ot 2015 and he was told he was diagnosed a year or two ago. First diagnosed maybe a year ago. Was told her was prediabetic. a1c from 2015 was 5.3. highest reading here I can find is 6.3.  Currently on the following medications: metformin 1000 mg BID. Takes medications as prescribed. Last A1C was 5.5 . Currently exercising and following diabetic diet. Denies any hypoglycemic events. Denies any vision changes, nausea, vomiting, abdominal pain, ulcers/paraesthesia in feet, polyuria, polydipsia or polyphagia. Denies any chest pain, shortness of breath. I decreased his metformin down to 500mg24m.   Hyperlipidemia Currently on statin therapy. Last lipid panel checked in 01/2019  Has had covid shots.   Review of Systems  Constitutional: Negative for chills, fatigue and fever.  HENT: Negative for dental problem, ear pain, hearing loss and trouble swallowing.   Eyes: Negative for visual disturbance.  Respiratory: Negative for cough, chest tightness, shortness of breath and wheezing.   Cardiovascular: Negative for chest pain, palpitations and leg swelling.  Gastrointestinal: Negative for abdominal pain, blood in stool, diarrhea and nausea.  Endocrine: Negative for  cold intolerance, polydipsia, polyphagia and polyuria.  Genitourinary: Negative for dysuria and hematuria.  Musculoskeletal: Negative for arthralgias.  Skin: Negative for rash.  Neurological: Negative for dizziness, light-headedness and headaches.  Psychiatric/Behavioral: Negative for dysphoric mood and sleep disturbance. The patient is not nervous/anxious.     Allergies Patient is allergic to ace inhibitors; codeine; and spironolactone.  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  Surgical History Patient  has a past surgical history that includes Tonsillectomy; Skin surgery; Neck surgery (2005); Total hip arthroplasty (Right, 09/18/2010); Hiatal hernia repair (2011); and Adenoidectomy.  Family History Pateint's family history includes Cancer in his brother; Coronary artery disease in his father and mother; Diabetes in his brother and sister; Heart attack in his father and mother; Hypertension in his father, mother, and sister.  Social History Patient  reports that he quit smoking about 46 years ago. His smoking use included cigarettes and pipe. He has a 4.50 pack-year smoking history. He has never used smokeless tobacco. He reports current alcohol use. He reports that he does not use drugs.    Objective: Vitals:   07/15/19 0811  BP: 112/70  Pulse: 68  Temp: (!) 97.3 F (36.3 C)  TempSrc: Temporal  SpO2: 97%  Weight: 230 lb 3.2 oz (104.4 kg)  Height: 6' 1"  (1.854 m)    Body mass index  is 30.37 kg/m.  Physical Exam Vitals reviewed.  Constitutional:      Appearance: Normal  appearance. He is well-developed and normal weight.  HENT:     Right Ear: External ear normal.     Left Ear: External ear normal.  Eyes:     Conjunctiva/sclera: Conjunctivae normal.     Pupils: Pupils are equal, round, and reactive to light.  Neck:     Thyroid: No thyromegaly.  Cardiovascular:     Rate and Rhythm: Normal rate and regular rhythm.     Heart sounds: Murmur present.  Pulmonary:     Effort: Pulmonary effort is normal.     Breath sounds: Normal breath sounds.  Abdominal:     General: Abdomen is flat. Bowel sounds are normal. There is no distension.     Palpations: Abdomen is soft.     Tenderness: There is no abdominal tenderness.  Musculoskeletal:     Cervical back: Normal range of motion and neck supple.  Lymphadenopathy:     Cervical: No cervical adenopathy.  Skin:    General: Skin is warm and dry.     Capillary Refill: Capillary refill takes less than 2 seconds.     Findings: No rash.  Neurological:     General: No focal deficit present.     Mental Status: He is alert and oriented to person, place, and time.     Cranial Nerves: No cranial nerve deficit.     Coordination: Coordination normal.     Deep Tendon Reflexes: Reflexes normal.  Psychiatric:        Mood and Affect: Mood normal.        Behavior: Behavior normal.      Office Visit from 07/15/2019 in [place]  PHQ-2 Total Score  0         Assessment/plan: 1. Essential hypertension Blood pressure is to goal. Continue current anti-hypertensive medications. Refills given and routine lab work will be done today. Recommended routine exercise and healthy diet including DASH diet and mediterranean diet. Encouraged weight loss. F/u in 6 months.   - CBC with Differential/Platelet - Comprehensive metabolic panel  2. Prediabetes Doing well. I decreased his metformin down to 562m bid. Will check a1c today and see where he is at. F/u in 6 months.  - Hemoglobin A1c   4. Acute  diastolic CHF (congestive heart failure) (HCC)  - furosemide (LASIX) 40 MG tablet; TAKE 1 TABLET BY MOUTH TWICE DAILY(AT 8AM AND 2PM)  Dispense: 180 tablet; Refill: 1      This visit occurred during the SARS-CoV-2 public health emergency.  Safety protocols were in place, including screening questions prior to the visit, additional usage of staff PPE, and extensive cleaning of exam room while observing appropriate contact time as indicated for disinfecting solutions.     Return in about 6 months (around 01/15/2020) for routine check up. fasting labs. .Orma Flaming MD [place]  07/15/2019

## 2019-07-17 ENCOUNTER — Encounter: Payer: Self-pay | Admitting: Family Medicine

## 2019-07-17 ENCOUNTER — Other Ambulatory Visit: Payer: Medicare Other

## 2019-07-17 ENCOUNTER — Other Ambulatory Visit: Payer: Self-pay

## 2019-07-17 ENCOUNTER — Other Ambulatory Visit: Payer: Self-pay | Admitting: Family Medicine

## 2019-07-17 DIAGNOSIS — I10 Essential (primary) hypertension: Secondary | ICD-10-CM

## 2019-07-17 DIAGNOSIS — D891 Cryoglobulinemia: Secondary | ICD-10-CM

## 2019-07-20 ENCOUNTER — Encounter: Payer: Self-pay | Admitting: Family Medicine

## 2019-07-23 ENCOUNTER — Encounter: Payer: Self-pay | Admitting: Family Medicine

## 2019-07-24 ENCOUNTER — Telehealth: Payer: Self-pay | Admitting: Family Medicine

## 2019-07-24 NOTE — Telephone Encounter (Signed)
Called patient to discuss lab results. Left VM to call us back. He called back and I discussed the following.   CBC showed his hemoglobin much improved. Sorry about the delay, the lab never released the lab due to the cold agglutin issue. Regular f/u in 6 months. Thanks for repeating this and being so patient.   Dr. Rogers Blocker

## 2019-07-29 ENCOUNTER — Other Ambulatory Visit: Payer: Self-pay

## 2019-07-29 ENCOUNTER — Ambulatory Visit (INDEPENDENT_AMBULATORY_CARE_PROVIDER_SITE_OTHER): Payer: Medicare Other | Admitting: General Practice

## 2019-07-29 DIAGNOSIS — Z7901 Long term (current) use of anticoagulants: Secondary | ICD-10-CM | POA: Diagnosis not present

## 2019-07-29 LAB — POCT INR: INR: 3 (ref 2.0–3.0)

## 2019-07-29 NOTE — Patient Instructions (Signed)
Pre visit review using our clinic review tool, if applicable. No additional management support is needed unless otherwise documented below in the visit note.  Continue to take 1/2 tablet daily except take 1 tablet on Monday/Wed/Fridays and recheck in 4 weeks.

## 2019-08-18 ENCOUNTER — Encounter: Payer: Self-pay | Admitting: Hematology & Oncology

## 2019-08-19 ENCOUNTER — Inpatient Hospital Stay: Payer: Medicare Other | Attending: Hematology & Oncology

## 2019-08-19 ENCOUNTER — Telehealth: Payer: Self-pay | Admitting: Hematology & Oncology

## 2019-08-19 ENCOUNTER — Other Ambulatory Visit: Payer: Self-pay

## 2019-08-19 ENCOUNTER — Inpatient Hospital Stay (HOSPITAL_BASED_OUTPATIENT_CLINIC_OR_DEPARTMENT_OTHER): Payer: Medicare Other | Admitting: Hematology & Oncology

## 2019-08-19 ENCOUNTER — Encounter: Payer: Self-pay | Admitting: Hematology & Oncology

## 2019-08-19 VITALS — BP 125/59 | HR 62 | Temp 96.8°F | Resp 20 | Wt 228.0 lb

## 2019-08-19 DIAGNOSIS — D891 Cryoglobulinemia: Secondary | ICD-10-CM

## 2019-08-19 DIAGNOSIS — D5912 Cold autoimmune hemolytic anemia: Secondary | ICD-10-CM

## 2019-08-19 LAB — CMP (CANCER CENTER ONLY)
ALT: 19 U/L (ref 0–44)
AST: 17 U/L (ref 15–41)
Albumin: 4 g/dL (ref 3.5–5.0)
Alkaline Phosphatase: 62 U/L (ref 38–126)
Anion gap: 6 (ref 5–15)
BUN: 19 mg/dL (ref 8–23)
CO2: 31 mmol/L (ref 22–32)
Calcium: 9.2 mg/dL (ref 8.9–10.3)
Chloride: 105 mmol/L (ref 98–111)
Creatinine: 0.87 mg/dL (ref 0.61–1.24)
GFR, Est AFR Am: >60 mL/min (ref >60)
GFR, Estimated: >60 mL/min (ref >60)
Glucose, Bld: 136 mg/dL — ABNORMAL HIGH (ref 70–99)
Potassium: 4 mmol/L (ref 3.5–5.1)
Sodium: 142 mmol/L (ref 135–145)
Total Bilirubin: 1.3 mg/dL — ABNORMAL HIGH (ref 0.3–1.2)
Total Protein: 6.3 g/dL — ABNORMAL LOW (ref 6.5–8.1)

## 2019-08-19 LAB — CBC WITH DIFFERENTIAL (CANCER CENTER ONLY)
Abs Immature Granulocytes: 0.04 10*3/uL (ref 0.00–0.07)
Basophils Absolute: 0 10*3/uL (ref 0.0–0.1)
Basophils Relative: 0 %
Eosinophils Absolute: 0.1 10*3/uL (ref 0.0–0.5)
Eosinophils Relative: 1 %
HCT: UNDETERMINED % (ref 39.0–52.0)
Hemoglobin: 12 g/dL — ABNORMAL LOW (ref 13.0–17.0)
Immature Granulocytes: 1 %
Lymphocytes Relative: 31 %
Lymphs Abs: 2.2 10*3/uL (ref 0.7–4.0)
MCH: UNDETERMINED pg (ref 26.0–34.0)
MCHC: UNDETERMINED g/dL (ref 30.0–36.0)
MCV: UNDETERMINED fL (ref 80.0–100.0)
Monocytes Absolute: 0.6 10*3/uL (ref 0.1–1.0)
Monocytes Relative: 8 %
Neutro Abs: 4.1 10*3/uL (ref 1.7–7.7)
Neutrophils Relative %: 59 %
Platelet Count: 171 10*3/uL (ref 150–400)
RBC: UNDETERMINED MIL/uL (ref 4.22–5.81)
RDW: UNDETERMINED % (ref 11.5–15.5)
WBC Count: 7 10*3/uL (ref 4.0–10.5)
nRBC: 0 % (ref 0.0–0.2)

## 2019-08-19 NOTE — Telephone Encounter (Signed)
Appointments scheduled calendar printed  & mailed per 6/9 los

## 2019-08-19 NOTE — Progress Notes (Signed)
Hematology and Oncology Follow Up Visit  Steven Shepard [id number] 01-23-1946 74 y.o. 08/19/2019   Principle Diagnosis:   Cold agglutinin disease  Current Therapy:    Observation     Interim History:  Steven Shepard is is back for another visit.  We last saw him a year ago.  We saw him again at that time for cold agglutinin disease.  He showed me a picture of his face when he was walking his dog during the winter.  He clearly has areas of cyanosis.  Again I am sure this is the cold agglutinin titer.  When we saw him back in the summer 2020, his cold agglutinin titer was 1: >4096.  He really is not bothered with his blood counts.  I want to make sure that none of his other body parts are can be affected.  He feels okay otherwise.  He has had his coronavirus vaccine.  He has had no problems with fever.  There is no cough.  Has had no nausea or vomiting.  He has had no rashes.  He has had no cyanosis of his hands or feet.  Clearly, this cold agglutinin disease is affecting him during the wintertime.  It has not yet affected his blood counts.  I know that there is to be a new medication approved for cold agglutinin disease.  This is a monoclonal antibody if I am not mistaken.  Currently, I would say his performance status is ECOG 0.  Medications:  Current Outpatient Medications:  .  acetaminophen (TYLENOL) 650 MG CR tablet, Take 650 mg by mouth 2 (two) times daily as needed for pain. , Disp: , Rfl:  .  atorvastatin (LIPITOR) 20 MG tablet, Take 1 tablet (20 mg total) by mouth daily., Disp: 90 tablet, Rfl: 3 .  cloNIDine (CATAPRES) 0.3 MG tablet, TAKE 1 TABLET BY MOUTH TWICE DAILY, Disp: 180 tablet, Rfl: 2 .  diltiazem (CARDIZEM CD) 180 MG 24 hr capsule, TAKE 2 CAPSULES BY MOUTH EVERY DAY, Disp: 180 capsule, Rfl: 2 .  Fexofenadine HCl (ALLEGRA PO), Take 1 tablet by mouth every morning. , Disp: , Rfl:  .  folic acid (FOLVITE) 263 MCG tablet, Take 400 mcg by mouth 2 (two) times daily.,  Disp: , Rfl:  .  furosemide (LASIX) 40 MG tablet, TAKE 1 TABLET BY MOUTH TWICE DAILY(AT 8AM AND 2PM), Disp: 180 tablet, Rfl: 1 .  metFORMIN (GLUCOPHAGE) 1000 MG tablet, Take 0.5 tablets (500 mg total) by mouth 2 (two) times daily with a meal., Disp: 180 tablet, Rfl: 3 .  metoprolol succinate (TOPROL-XL) 50 MG 24 hr tablet, TAKE 1 TABLET BY MOUTH TWICE DAILY, Disp: 180 tablet, Rfl: 3 .  Multiple Vitamin (MULTIVITAMIN) capsule, Take 1 capsule by mouth daily.  , Disp: , Rfl:  .  Multiple Vitamins-Minerals (EMERGEN-C IMMUNE PO), Take 1 tablet by mouth daily. , Disp: , Rfl:  .  Omega-3 Fatty Acids (OMEGA 3 500) 500 MG CAPS, Take 500 capsules by mouth daily., Disp: , Rfl:  .  potassium chloride SA (KLOR-CON) 20 MEQ tablet, TAKE 1 TABLET BY MOUTH TWICE DAILY, Disp: 180 tablet, Rfl: 3 .  Psyllium (METAMUCIL FIBER PO), Take 1 Dose by mouth daily. , Disp: , Rfl:  .  tamsulosin (FLOMAX) 0.4 MG CAPS capsule, TAKE 1 CAPSULE BY MOUTH EVERY DAY, Disp: 90 capsule, Rfl: 1 .  warfarin (COUMADIN) 5 MG tablet, TAKE 1/2 TABLET BY MOUTH DAILY EXCEPT 1 TABLET ON MONDAY, WEDNESDAY AND FRIDAY OR AS DIRECTED BY COUMADIN  CLINIC, Disp: 30 tablet, Rfl: 3  Allergies:  Allergies  Allergen Reactions  . Ace Inhibitors Other (See Comments)    Other reaction(s): High K+   . Codeine Other (See Comments)    Other reaction(s):Hallucinations  . Spironolactone Other (See Comments)    Increases potassium    Past Medical History, Surgical history, Social history, and Family History were reviewed and updated.  Review of Systems: Review of Systems  Constitutional: Negative.   HENT:  Negative.   Eyes: Negative.   Respiratory: Negative.   Cardiovascular: Negative.   Gastrointestinal: Negative.   Endocrine: Negative.   Genitourinary: Negative.    Musculoskeletal: Negative.   Skin: Negative.   Neurological: Negative.   Hematological: Negative.   Psychiatric/Behavioral: Negative.     Physical Exam:  weight is 228 lb  (103.4 kg). His temporal temperature is 96.8 F (36 C) (abnormal). His blood pressure is 125/59 (abnormal) and his pulse is 62. His respiration is 20 and oxygen saturation is 99%.   Wt Readings from Last 3 Encounters:  08/19/19 228 lb (103.4 kg)  07/15/19 230 lb 3.2 oz (104.4 kg)  04/23/19 232 lb (105.2 kg)    Physical Exam Vitals reviewed.  HENT:     Head: Normocephalic and atraumatic.  Eyes:     Pupils: Pupils are equal, round, and reactive to light.  Cardiovascular:     Rate and Rhythm: Normal rate and regular rhythm.     Heart sounds: Normal heart sounds.  Pulmonary:     Effort: Pulmonary effort is normal.     Breath sounds: Normal breath sounds.  Abdominal:     General: Bowel sounds are normal.     Palpations: Abdomen is soft.  Musculoskeletal:        General: No tenderness or deformity. Normal range of motion.     Cervical back: Normal range of motion.  Lymphadenopathy:     Cervical: No cervical adenopathy.  Skin:    General: Skin is warm and dry.     Findings: No erythema or rash.  Neurological:     Mental Status: He is alert and oriented to person, place, and time.  Psychiatric:        Behavior: Behavior normal.        Thought Content: Thought content normal.        Judgment: Judgment normal.      Lab Results  Component Value Date   WBC 7.0 08/19/2019   HGB 12.0 (L) 08/19/2019   HCT Unable to determine due to a cold agglutinin 08/19/2019   MCV Unable to determine due to a cold agglutinin 08/19/2019   PLT 171 08/19/2019     Chemistry      Component Value Date/Time   NA 142 08/19/2019 0954   K 4.0 08/19/2019 0954   CL 105 08/19/2019 0954   CO2 31 08/19/2019 0954   BUN 19 08/19/2019 0954   CREATININE 0.87 08/19/2019 0954   CREATININE 0.77 02/16/2015 1257      Component Value Date/Time   CALCIUM 9.2 08/19/2019 0954   ALKPHOS 62 08/19/2019 0954   AST 17 08/19/2019 0954   ALT 19 08/19/2019 0954   BILITOT 1.3 (H) 08/19/2019 0954      Impression  and Plan: Steven Shepard is a very nice 74 year old white male.  He has cold agglutinin disease.  As far as I can tell, this is idiopathic.  There is no associated lymphoproliferative disease or autoimmune disease.  Again he really is not bothered with the  warm weather.  I am sure he still has a high cold agglutinin titer.  I want to get him back in the winter now.  I would like to see how he looks in the wintertime.  I told him to make sure that he wears a scarf around his neck and face.  He has to wear a wool cap to cover up his ears.     Volanda Napoleon, MD 6/9/202111:08 AM

## 2019-08-20 LAB — COLD AGGLUTININ TITER: Cold Agglutinin Titer: >1:4096 {titer}

## 2019-08-20 LAB — IGG, IGA, IGM
IgA: 215 mg/dL (ref 61–437)
IgG (Immunoglobin G), Serum: 1011 mg/dL (ref 603–1613)
IgM (Immunoglobulin M), Srm: 167 mg/dL — ABNORMAL HIGH (ref 15–143)

## 2019-08-24 LAB — CRYOGLOBULIN

## 2019-08-26 ENCOUNTER — Other Ambulatory Visit: Payer: Self-pay

## 2019-08-26 ENCOUNTER — Ambulatory Visit (INDEPENDENT_AMBULATORY_CARE_PROVIDER_SITE_OTHER): Payer: Medicare Other | Admitting: General Practice

## 2019-08-26 DIAGNOSIS — Z7901 Long term (current) use of anticoagulants: Secondary | ICD-10-CM | POA: Diagnosis not present

## 2019-08-26 LAB — POCT INR: INR: 2.7 (ref 2.0–3.0)

## 2019-08-26 NOTE — Patient Instructions (Addendum)
Pre visit review using our clinic review tool, if applicable. No additional management support is needed unless otherwise documented below in the visit note.  Continue to take 1/2 tablet daily except take 1 tablet on Monday/Wed/Fridays and recheck in 6 weeks.

## 2019-09-16 ENCOUNTER — Other Ambulatory Visit: Payer: Self-pay | Admitting: Family Medicine

## 2019-09-16 DIAGNOSIS — Z7901 Long term (current) use of anticoagulants: Secondary | ICD-10-CM

## 2019-09-16 NOTE — Telephone Encounter (Signed)
Please review

## 2019-09-23 ENCOUNTER — Ambulatory Visit (HOSPITAL_COMMUNITY): Payer: Medicare Other | Attending: Cardiology

## 2019-09-23 ENCOUNTER — Other Ambulatory Visit: Payer: Self-pay

## 2019-09-23 DIAGNOSIS — I350 Nonrheumatic aortic (valve) stenosis: Secondary | ICD-10-CM | POA: Diagnosis not present

## 2019-10-07 ENCOUNTER — Ambulatory Visit (INDEPENDENT_AMBULATORY_CARE_PROVIDER_SITE_OTHER): Payer: Medicare Other | Admitting: General Practice

## 2019-10-07 ENCOUNTER — Other Ambulatory Visit: Payer: Self-pay

## 2019-10-07 DIAGNOSIS — Z7901 Long term (current) use of anticoagulants: Secondary | ICD-10-CM

## 2019-10-07 LAB — POCT INR: INR: 3.7 — AB (ref 2.0–3.0)

## 2019-10-07 NOTE — Progress Notes (Signed)
I have reviewed and agree with note, evaluation, plan.   Tehani Mersman, MD  

## 2019-10-07 NOTE — Patient Instructions (Addendum)
Pre visit review using our clinic review tool, if applicable. No additional management support is needed unless otherwise documented below in the visit note.  Skip coumadin today (7/28) and then continue to take 1/2 tablet daily except take 1 tablet on Monday/Wed/Fridays and recheck in 4 weeks.  Add 2 to 3 servings of dark green leafy greens weekly.

## 2019-10-09 ENCOUNTER — Other Ambulatory Visit: Payer: Self-pay | Admitting: Cardiology

## 2019-10-09 DIAGNOSIS — E7849 Other hyperlipidemia: Secondary | ICD-10-CM

## 2019-10-09 DIAGNOSIS — I110 Hypertensive heart disease with heart failure: Secondary | ICD-10-CM

## 2019-10-28 NOTE — Progress Notes (Signed)
Cardiology Office Note    Date:  11/03/2019   ID:  Steven Shepard, DOB 1946-01-04, MRN [id number]  PCP:  Orma Flaming, MD  Cardiologist: Ena Dawley, MD EPS: None  Chief Complaint  Patient presents with  . Follow-up    History of Present Illness:  Steven Shepard is a 74 y.o. male with history of PAF, HTN, HLD (followed by PCP), prior moderate ETOH (now sporadic), moderate aortic stenosis, mildly dilated ascending aorta, edema felt 2/2 chronic diastolic CHF, hemolytic anemia, pre-DM, cold agglutinin disease, COPD, OSA on CPAP.He has remained on Coumadin instead of a NOAC because he was told we know for sure that Coumadin works in the setting of cold agglutinin disease. He has learned to limit ETOH intake as this had triggered events in the past. His last echo following that visit 11/2017 showed EF 60-65%, grade 1 DD, moderate aortic stenosis, mildly dilated ascending aorta.    Patient last saw Dr. Meda Coffee 04/23/2019 and was in normal sinus rhythm.  She recommended follow-up echo prior to next office visit.  2D echo 09/23/2019 showed moderate aortic stenosis mean gradient 25 mmHg increased since last echo 11/2018- 17 mmHg, mildly dilated ascending aorta 40 mm.  Patient comes in for f/u. Denies chest pain, dyspnea, dizziness or presyncope. Walks 1-2 miles daily, 10,000 steps. Occasional ankle edema goes away when he raises his feet.   Past Medical History:  Diagnosis Date  . Anemia, hemolytic ([place]) 01/22/2011  . Aortic stenosis   . 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 COPD (chronic obstructive pulmonary disease) ([place])   . DOE (dyspnea on exertion)   . Elevated bilirubin   . Fatty liver   . History of neck surgery   . Hyperlipidemia   . Hypertension   . Mild dilation of ascending aorta (H[facility])   . OSA on CPAP   . PAF (paroxysmal atrial fibrillation) ([place])   . Pre-diabetes   . Umbilical hernia     Past Surgical History:  Procedure Laterality Date  . ADENOIDECTOMY      . HIATAL HERNIA REPAIR  2011  . NECK SURGERY  2005  . SKIN SURGERY     skin cancer  . TONSILLECTOMY    . TOTAL HIP ARTHROPLASTY Right 09/18/2010   Dr. Maureen Ralphs    Current Medications: Current Meds  Medication Sig  . acetaminophen (TYLENOL) 650 MG CR tablet Take 650 mg by mouth 2 (two) times daily as needed for pain.   Marland Kitchen atorvastatin (LIPITOR) 20 MG tablet Take 1 tablet (20 mg total) by mouth daily.  . cloNIDine (CATAPRES) 0.3 MG tablet TAKE 1 TABLET BY MOUTH TWICE DAILY  . diltiazem (CARDIZEM CD) 180 MG 24 hr capsule TAKE 2 CAPSULES BY MOUTH EVERY DAY  . Fexofenadine HCl (ALLEGRA PO) Take 1 tablet by mouth every morning.   . folic acid (FOLVITE) 975 MCG tablet Take 400 mcg by mouth 2 (two) times daily.  . furosemide (LASIX) 40 MG tablet TAKE 1 TABLET BY MOUTH TWICE DAILY(AT 8AM AND 2PM)  . metFORMIN (GLUCOPHAGE) 1000 MG tablet Take 0.5 tablets (500 mg total) by mouth 2 (two) times daily with a meal.  . metoprolol succinate (TOPROL-XL) 50 MG 24 hr tablet TAKE 1 TABLET BY MOUTH TWICE DAILY  . Multiple Vitamin (MULTIVITAMIN) capsule Take 1 capsule by mouth daily.    . Multiple Vitamins-Minerals (EMERGEN-C IMMUNE PO) Take 1 tablet by mouth daily.   . Omega-3 Fatty Acids (OMEGA 3 500) 500 MG CAPS Take 500 capsules by mouth daily.  Marland Kitchen  potassium chloride SA (KLOR-CON) 20 MEQ tablet TAKE 1 TABLET BY MOUTH TWICE DAILY  . Psyllium (METAMUCIL FIBER PO) Take 1 Dose by mouth daily.   . tamsulosin (FLOMAX) 0.4 MG CAPS capsule TAKE 1 CAPSULE BY MOUTH EVERY DAY  . warfarin (COUMADIN) 5 MG tablet TAKE 1/2 TABLET BY MOUTH DAILY EXCEPT 1 TABLET ON MONDAY, WEDNESDAY AND FRIDAY OR AS DIRECTED BY COUMADIN CLINIC     Allergies:   Ace inhibitors, Codeine, and Spironolactone   Social History   Socioeconomic History  . Marital status: Married    Spouse name: Not on file  . Number of children: Not on file  . Years of education: Not on file  . Highest education level: Not on file  Occupational History  .  Occupation: Architectural technologist: [employer]  Tobacco Use  . Smoking status: Former Smoker    Packs/day: 1.50    Years: 3.00    Pack years: 4.50    Types: Cigarettes, Pipe    Quit date: 03/12/1973    Years since quitting: 46.6  . Smokeless tobacco: Never Used  . Tobacco comment: quit appox 36 years ago  Vaping Use  . Vaping Use: Never used  Substance and Sexual Activity  . Alcohol use: Yes    Comment: occasionally, 1-7 drinks per week  . Drug use: No  . Sexual activity: Yes  Other Topics Concern  . Not on file  Social History Narrative  . Not on file   Social Determinants of Health   Financial Resource Strain:   . Difficulty of Paying Living Expenses: Not on file  Food Insecurity:   . Worried About Charity fundraiser in the Last Year: Not on file  . Ran Out of Food in the Last Year: Not on file  Transportation Needs:   . Lack of Transportation (Medical): Not on file  . Lack of Transportation (Non-Medical): Not on file  Physical Activity:   . Days of Exercise per Week: Not on file  . Minutes of Exercise per Session: Not on file  Stress:   . Feeling of Stress : Not on file  Social Connections:   . Frequency of Communication with Friends and Family: Not on file  . Frequency of Social Gatherings with Friends and Family: Not on file  . Attends Religious Services: Not on file  . Active Member of Clubs or Organizations: Not on file  . Attends Archivist Meetings: Not on file  . Marital Status: Not on file     Family History:  The patient's family history includes Cancer in his brother; Coronary artery disease in his father and mother; Diabetes in his brother and sister; Heart attack in his father and mother; Hypertension in his father, mother, and sister.   ROS:   Please see the history of present illness.    ROS All other systems reviewed and are negative.   PHYSICAL EXAM:   VS:  BP 120/60   Pulse 69   Ht 6' 1"  (1.854 m)   Wt 227 lb (103 kg)   SpO2 95%    BMI 29.95 kg/m   Physical Exam  GEN: Well nourished, well developed, in no acute distress  Neck: no JVD, carotid bruits, or masses Cardiac:RRR; normal S1, decreased S2 3/6 harsh systolic murmur at the left sternal border Respiratory:  clear to auscultation bilaterally, normal work of breathing GI: soft, nontender, nondistended, + BS Ext: without cyanosis, clubbing, or edema, Good distal pulses bilaterally Neuro:  Alert and Oriented x 3 Psych: euthymic mood, full affect  Wt Readings from Last 3 Encounters:  11/03/19 227 lb (103 kg)  08/19/19 228 lb (103.4 kg)  07/15/19 230 lb 3.2 oz (104.4 kg)      Studies/Labs Reviewed:   EKG:  EKG is not ordered today.   Recent Labs: 01/14/2019: TSH 3.85 08/19/2019: ALT 19; BUN 19; Creatinine 0.87; Hemoglobin 12.0; Platelet Count 171; Potassium 4.0; Sodium 142   Lipid Panel    Component Value Date/Time   CHOL 143 01/14/2019 1325   TRIG 194.0 (H) 01/14/2019 1325   HDL 38.10 (L) 01/14/2019 1325   CHOLHDL 4 01/14/2019 1325   VLDL 38.8 01/14/2019 1325   LDLCALC 66 01/14/2019 1325    Additional studies/ records that were reviewed today include:  2D echo 09/23/2019 IMPRESSIONS     1. Left ventricular ejection fraction, by estimation, is 60 to 65%. The  left ventricle has normal function. The left ventricle has no regional  wall motion abnormalities. There is mild left ventricular hypertrophy.  Left ventricular diastolic parameters  are consistent with Grade I diastolic dysfunction (impaired relaxation).  The average left ventricular global longitudinal strain is -16.2 %. The  global longitudinal strain is normal.   2. Right ventricular systolic function is normal. The right ventricular  size is normal. There is normal pulmonary artery systolic pressure.   3. The mitral valve is normal in structure. Mild mitral valve  regurgitation. No evidence of mitral stenosis.   4. The aortic valve is abnormal. Aortic valve regurgitation is not    visualized. Moderate aortic valve stenosis. Aortic valve area, by VTI  measures 1.33 cm. Aortic valve mean gradient measures 25.0 mmHg. Aortic  valve Vmax measures 3.34 m/s.   5. Aortic dilatation noted. There is mild dilatation of the ascending  aorta measuring 40 mm.   6. The inferior vena cava is normal in size with greater than 50%  respiratory variability, suggesting right atrial pressure of 3 mmHg.   Comparison(s): Prior images reviewed side by side. Changes from prior  study are noted. Prior aortic valve mean gradient - 17 mmHg.   Aortic Valve: The aortic valve is abnormal. . There is severe thickening  and severe calcifcation of the aortic valve. Aortic valve regurgitation is  not visualized. Moderate aortic stenosis is present. There is severe  thickening of the aortic valve. There   is severe calcifcation of the aortic valve. Aortic valve mean gradient  measures 25.0 mmHg. Aortic valve peak gradient measures 44.6 mmHg. Aortic  valve area, by VTI measures 1.33 cm.     ASSESSMENT:    1. Paroxysmal atrial fibrillation (HCC)   2. Moderate aortic stenosis   3. Mild ascending aorta dilatation (HCC)   4. Chronic diastolic CHF (congestive heart failure) ([place])   5. Hyperlipidemia, unspecified hyperlipidemia type      PLAN:  In order of problems listed above:  Paroxysmal atrial fibrillation maintaining normal sinus rhythm on Coumadin.  Heart rate regular today.  No EKG done.  Labs reviewed from May and June and were stable.  Moderate aortic stenosis stable on echo 09/23/2018 1 aortic valve mean gradient 25 mmHg increased since last echo 2020-17 mmHg.  Currently asymptomatic.  Will check yearly echoes.  Mildly dilated ascending aorta 40 mm following yearly echoes  Chronic diastolic CHF compensated  Hyperlipidemia on atorvastatin due for fasting lipid panel in November.    Medication Adjustments/Labs and Tests Ordered: Current medicines are reviewed at length with the  patient today.  Concerns regarding medicines are outlined above.  Medication changes, Labs and Tests ordered today are listed in the Patient Instructions below. Patient Instructions  Medication Instructions:  Your physician recommends that you continue on your current medications as directed. Please refer to the Current Medication list given to you today.  *If you need a refill on your cardiac medications before your next appointment, please call your pharmacy*   Lab Work: Your physician recommends that you return for a FASTING lipid profile: 01/18/2020  If you have labs (blood work) drawn today and your tests are completely normal, you will receive your results only by: Marland Kitchen MyChart Message (if you have MyChart) OR . A paper copy in the mail If you have any lab test that is abnormal or we need to change your treatment, we will call you to review the results.   Testing/Procedures: Your physician has requested that you have an echocardiogram. Echocardiography is a painless test that uses sound waves to create images of your heart. It provides your doctor with information about the size and shape of your heart and how well your heart's chambers and valves are working. This procedure takes approximately one hour. There are no restrictions for this procedure.     Follow-Up: At [facility], you and your health needs are our priority.  As part of our continuing mission to provide you with exceptional heart care, we have created designated Provider Care Teams.  These Care Teams include your primary Cardiologist (physician) and Advanced Practice Providers (APPs -  Physician Assistants and Nurse Practitioners) who all work together to provide you with the care you need, when you need it.  We recommend signing up for the patient portal called "MyChart".  Sign up information is provided on this After Visit Summary.  MyChart is used to connect with patients for Virtual Visits (Telemedicine).  Patients  are able to view lab/test results, encounter notes, upcoming appointments, etc.  Non-urgent messages can be sent to your provider as well.   To learn more about what you can do with MyChart, go to [email or web address].    Your next appointment:   1 year(s)  The format for your next appointment:   In Person  Provider:   You may see Ena Dawley, MD or one of the following Advanced Practice Providers on your designated Care Team:    Melina Copa, [place]  Ermalinda Barrios, PA-C    Other Instructions None     Signed, Ermalinda Barrios, PA-C  11/03/2019 10:30 AM    [address], [place], [place]  [address] Phone: [phone]; Fax: [phone]

## 2019-11-02 ENCOUNTER — Encounter: Payer: Self-pay | Admitting: Family Medicine

## 2019-11-03 ENCOUNTER — Ambulatory Visit (INDEPENDENT_AMBULATORY_CARE_PROVIDER_SITE_OTHER): Payer: Medicare Other | Admitting: Physician Assistant

## 2019-11-03 ENCOUNTER — Encounter: Payer: Self-pay | Admitting: Physician Assistant

## 2019-11-03 ENCOUNTER — Other Ambulatory Visit: Payer: Self-pay

## 2019-11-03 VITALS — BP 120/60 | HR 69 | Ht 73.0 in | Wt 227.0 lb

## 2019-11-03 DIAGNOSIS — I77810 Thoracic aortic ectasia: Secondary | ICD-10-CM | POA: Diagnosis not present

## 2019-11-03 DIAGNOSIS — I480 Paroxysmal atrial fibrillation: Secondary | ICD-10-CM | POA: Diagnosis not present

## 2019-11-03 DIAGNOSIS — I5032 Chronic diastolic (congestive) heart failure: Secondary | ICD-10-CM

## 2019-11-03 DIAGNOSIS — E785 Hyperlipidemia, unspecified: Secondary | ICD-10-CM

## 2019-11-03 DIAGNOSIS — I350 Nonrheumatic aortic (valve) stenosis: Secondary | ICD-10-CM | POA: Diagnosis not present

## 2019-11-03 NOTE — Patient Instructions (Signed)
Medication Instructions:  Your physician recommends that you continue on your current medications as directed. Please refer to the Current Medication list given to you today.  *If you need a refill on your cardiac medications before your next appointment, please call your pharmacy*   Lab Work: Your physician recommends that you return for a FASTING lipid profile: 01/18/2020  If you have labs (blood work) drawn today and your tests are completely normal, you will receive your results only by: Marland Kitchen MyChart Message (if you have MyChart) OR . A paper copy in the mail If you have any lab test that is abnormal or we need to change your treatment, we will call you to review the results.   Testing/Procedures: Your physician has requested that you have an echocardiogram. Echocardiography is a painless test that uses sound waves to create images of your heart. It provides your doctor with information about the size and shape of your heart and how well your heart's chambers and valves are working. This procedure takes approximately one hour. There are no restrictions for this procedure.     Follow-Up: At [facility], you and your health needs are our priority.  As part of our continuing mission to provide you with exceptional heart care, we have created designated Provider Care Teams.  These Care Teams include your primary Cardiologist (physician) and Advanced Practice Providers (APPs -  Physician Assistants and Nurse Practitioners) who all work together to provide you with the care you need, when you need it.  We recommend signing up for the patient portal called "MyChart".  Sign up information is provided on this After Visit Summary.  MyChart is used to connect with patients for Virtual Visits (Telemedicine).  Patients are able to view lab/test results, encounter notes, upcoming appointments, etc.  Non-urgent messages can be sent to your provider as well.   To learn more about what you can do with  MyChart, go to [email or web address].    Your next appointment:   1 year(s)  The format for your next appointment:   In Person  Provider:   You may see Ena Dawley, MD or one of the following Advanced Practice Providers on your designated Care Team:    Melina Copa, PA-C  Ermalinda Barrios, PA-C    Other Instructions None

## 2019-11-04 ENCOUNTER — Encounter: Payer: Self-pay | Admitting: Family Medicine

## 2019-11-04 ENCOUNTER — Ambulatory Visit (INDEPENDENT_AMBULATORY_CARE_PROVIDER_SITE_OTHER): Payer: Medicare Other | Admitting: General Practice

## 2019-11-04 DIAGNOSIS — Z7901 Long term (current) use of anticoagulants: Secondary | ICD-10-CM | POA: Diagnosis not present

## 2019-11-04 LAB — POCT INR: INR: 2.9 (ref 2.0–3.0)

## 2019-11-04 NOTE — Patient Instructions (Signed)
Pre visit review using our clinic review tool, if applicable. No additional management support is needed unless otherwise documented below in the visit note.  Continue to take 1/2 tablet daily except take 1 tablet on Monday/Wed/Fridays and recheck in 6 weeks.  Add 2 to 3 servings of dark green leafy greens weekly.

## 2019-11-17 DIAGNOSIS — E119 Type 2 diabetes mellitus without complications: Secondary | ICD-10-CM | POA: Diagnosis not present

## 2019-11-17 DIAGNOSIS — H5203 Hypermetropia, bilateral: Secondary | ICD-10-CM | POA: Diagnosis not present

## 2019-11-17 LAB — HM DIABETES EYE EXAM

## 2019-12-09 ENCOUNTER — Ambulatory Visit: Payer: Medicare Other

## 2019-12-15 ENCOUNTER — Encounter: Payer: Self-pay | Admitting: Family Medicine

## 2019-12-16 ENCOUNTER — Other Ambulatory Visit: Payer: Self-pay

## 2019-12-16 ENCOUNTER — Ambulatory Visit: Payer: Medicare Other

## 2019-12-16 ENCOUNTER — Ambulatory Visit (INDEPENDENT_AMBULATORY_CARE_PROVIDER_SITE_OTHER): Payer: Medicare Other | Admitting: General Practice

## 2019-12-16 DIAGNOSIS — Z7901 Long term (current) use of anticoagulants: Secondary | ICD-10-CM

## 2019-12-16 DIAGNOSIS — D225 Melanocytic nevi of trunk: Secondary | ICD-10-CM | POA: Diagnosis not present

## 2019-12-16 DIAGNOSIS — D229 Melanocytic nevi, unspecified: Secondary | ICD-10-CM | POA: Diagnosis not present

## 2019-12-16 DIAGNOSIS — Z85828 Personal history of other malignant neoplasm of skin: Secondary | ICD-10-CM | POA: Diagnosis not present

## 2019-12-16 DIAGNOSIS — L821 Other seborrheic keratosis: Secondary | ICD-10-CM | POA: Diagnosis not present

## 2019-12-16 DIAGNOSIS — D485 Neoplasm of uncertain behavior of skin: Secondary | ICD-10-CM | POA: Diagnosis not present

## 2019-12-16 DIAGNOSIS — L570 Actinic keratosis: Secondary | ICD-10-CM | POA: Diagnosis not present

## 2019-12-16 DIAGNOSIS — D1801 Hemangioma of skin and subcutaneous tissue: Secondary | ICD-10-CM | POA: Diagnosis not present

## 2019-12-16 DIAGNOSIS — D239 Other benign neoplasm of skin, unspecified: Secondary | ICD-10-CM | POA: Diagnosis not present

## 2019-12-16 LAB — POCT INR: INR: 2.7 (ref 2.0–3.0)

## 2019-12-16 NOTE — Patient Instructions (Signed)
Pre visit review using our clinic review tool, if applicable. No additional management support is needed unless otherwise documented below in the visit note.  Continue to take 1/2 tablet daily except take 1 tablet on Monday/Wed/Fridays and recheck in 6 weeks.  Add 2 to 3 servings of dark green leafy greens weekly.

## 2019-12-18 DIAGNOSIS — Z23 Encounter for immunization: Secondary | ICD-10-CM | POA: Diagnosis not present

## 2019-12-23 ENCOUNTER — Encounter: Payer: Self-pay | Admitting: Family Medicine

## 2020-01-07 ENCOUNTER — Other Ambulatory Visit: Payer: Self-pay | Admitting: Family Medicine

## 2020-01-07 DIAGNOSIS — I5031 Acute diastolic (congestive) heart failure: Secondary | ICD-10-CM

## 2020-01-07 DIAGNOSIS — I480 Paroxysmal atrial fibrillation: Secondary | ICD-10-CM

## 2020-01-11 ENCOUNTER — Other Ambulatory Visit: Payer: Medicare Other | Admitting: *Deleted

## 2020-01-11 ENCOUNTER — Other Ambulatory Visit: Payer: Self-pay

## 2020-01-11 DIAGNOSIS — E785 Hyperlipidemia, unspecified: Secondary | ICD-10-CM | POA: Diagnosis not present

## 2020-01-11 LAB — LIPID PANEL
Chol/HDL Ratio: 3 ratio (ref 0.0–5.0)
Cholesterol, Total: 136 mg/dL (ref 100–199)
HDL: 45 mg/dL (ref >39)
LDL Chol Calc (NIH): 71 mg/dL (ref 0–99)
Triglycerides: 112 mg/dL (ref 0–149)
VLDL Cholesterol Cal: 20 mg/dL (ref 5–40)

## 2020-01-12 ENCOUNTER — Ambulatory Visit (INDEPENDENT_AMBULATORY_CARE_PROVIDER_SITE_OTHER): Payer: Medicare Other | Admitting: Pulmonary Disease

## 2020-01-12 ENCOUNTER — Encounter: Payer: Self-pay | Admitting: Pulmonary Disease

## 2020-01-12 VITALS — BP 120/70 | HR 64 | Temp 97.3°F | Ht 72.5 in | Wt 226.4 lb

## 2020-01-12 DIAGNOSIS — J449 Chronic obstructive pulmonary disease, unspecified: Secondary | ICD-10-CM

## 2020-01-12 DIAGNOSIS — Z23 Encounter for immunization: Secondary | ICD-10-CM

## 2020-01-12 DIAGNOSIS — G4733 Obstructive sleep apnea (adult) (pediatric): Secondary | ICD-10-CM | POA: Diagnosis not present

## 2020-01-12 NOTE — Assessment & Plan Note (Signed)
Very mild does not need bronchodilators High-dose flu shot today

## 2020-01-12 NOTE — Addendum Note (Signed)
Addended by: Lorretta Harp on: 01/12/2020 10:00 AM   Modules accepted: Orders

## 2020-01-12 NOTE — Progress Notes (Signed)
° °  Subjective:    Patient ID: Steven Shepard, male    DOB: 10-10-1945, 74 y.o.   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 Pt is needing supplies for CPAP and was told that he needed to have a follow up prior to receiving the supplies. DME: [employer]   2-year follow-up visit. He is retired as a Cabin crew.  [place].  He has 3 machines, an old unit in his home, on newer one from 2019 and the beach and the [employer] mini for travel. No problems with mask or pressure. We reviewed download on his phone which shows good control of events on 9 cm and minimal leak, excellent compliance more than 8 hours per night.  He even uses this during his map. He has dropped 30 pounds to his current weight  We discussed Covid related issues of vaccines and masking  Significant tests/ events reviewed  PSG 09/2010 severe obstructive sleep apnea with AHI 86/h corrected by CPAP 9 cm  Maintained on CPAP 9cm, small nasal comfort gel mask,   2010 Spirometry FEV1 83%, ratio 69  Review of Systems Patient denies significant dyspnea,cough, hemoptysis,  chest pain, palpitations, pedal edema, orthopnea, paroxysmal nocturnal dyspnea, lightheadedness, nausea, vomiting, abdominal or  leg pains      Objective:   Physical Exam  Gen. Pleasant, obese, in no distress ENT - no lesions, no post nasal drip Neck: No JVD, no thyromegaly, no carotid bruits Lungs: no use of accessory muscles, no dullness to percussion, decreased without rales or rhonchi  Cardiovascular: Rhythm regular, heart sounds  normal, no murmurs or gallops, no peripheral edema Musculoskeletal: No deformities, no cyanosis or clubbing , no tremors       Assessment & Plan:

## 2020-01-12 NOTE — Patient Instructions (Signed)
CPAP 9 cm is effective Renew  supplies

## 2020-01-12 NOTE — Assessment & Plan Note (Signed)
Very well controlled on 9 cm.  He is very compliant and CPAP is certainly helped improve his daytime somnolence and fatigue. He uses CPAP during travel and naps CPAP supplies will be renewed for a year I complemented him on weight loss   compliance with goal of at least 4-6 hrs every night is the expectation. Advised against medications with sedative side effects Cautioned against driving when sleepy - understanding that sleepiness will vary on a day to day basis

## 2020-01-13 ENCOUNTER — Other Ambulatory Visit: Payer: Self-pay

## 2020-01-13 ENCOUNTER — Ambulatory Visit (INDEPENDENT_AMBULATORY_CARE_PROVIDER_SITE_OTHER): Payer: Medicare Other | Admitting: Family Medicine

## 2020-01-13 ENCOUNTER — Encounter: Payer: Self-pay | Admitting: Family Medicine

## 2020-01-13 VITALS — BP 122/70 | HR 56 | Temp 97.3°F | Ht 72.5 in | Wt 223.6 lb

## 2020-01-13 DIAGNOSIS — I10 Essential (primary) hypertension: Secondary | ICD-10-CM | POA: Diagnosis not present

## 2020-01-13 DIAGNOSIS — I480 Paroxysmal atrial fibrillation: Secondary | ICD-10-CM

## 2020-01-13 DIAGNOSIS — N400 Enlarged prostate without lower urinary tract symptoms: Secondary | ICD-10-CM

## 2020-01-13 DIAGNOSIS — K7581 Nonalcoholic steatohepatitis (NASH): Secondary | ICD-10-CM | POA: Diagnosis not present

## 2020-01-13 DIAGNOSIS — I350 Nonrheumatic aortic (valve) stenosis: Secondary | ICD-10-CM | POA: Diagnosis not present

## 2020-01-13 DIAGNOSIS — R7303 Prediabetes: Secondary | ICD-10-CM

## 2020-01-13 DIAGNOSIS — E782 Mixed hyperlipidemia: Secondary | ICD-10-CM

## 2020-01-13 NOTE — Progress Notes (Signed)
Patient: Steven Shepard MRN: [id number] DOB: [birth year] PCP: Orma Flaming, MD     Subjective:  Chief Complaint  Patient presents with  . Hypertension  . Prediabetes  . Atrial Fibrillation  . Hyperlipidemia  . Benign Prostatic Hypertrophy    HPI: The patient is a 74 y.o. male who presents today for chronic medical follow up.   Hypertension Here for follow up of hypertension. Currently on catapres .71mBID ,cardizem 3692mdaily ,toprol-xl 5047mID. Takes medication as prescribed and denies any side effects. Exercise includes walking. Weight has been stable. Denies any chest pain, headaches, shortness of breath, vision changes, swelling in lower extremities.  Prediabetes Patient is here for follow up of type 2 diabetes; however, I have not found an a1c of 6.5 in his labs going back ot 2015 and he was told he was diagnosed a year or two ago. First diagnosedmaybe a year ago. Was told her was prediabetic. a1c from 2015 was 5.3. highest reading here I can find is 6.3. Currently on the following medications: metformin 500 mg BID.Takes medications as prescribed. Last A1C was 5.7. Currently exercising and following diabetic diet. Denies any hypoglycemic events. Denies any vision changes, nausea, vomiting, abdominal pain, ulcers/paraesthesia in feet, polyuria, polydipsia or polyphagia. Denies any chest pain, shortness of breath.  Hyperlipidemia Just had lipid panel checked and to goal. LDL was 70. On lipitor 76m18my.   BPH Will check PSA today. On flomax medication for this. Has no urinary complaints. Has no nocturia.   His of PAF on long term anticoagulation with warfarin. Followed by cardiology. Saw cardiology 10/2019.   Has had his covid booster and his flu shot this year.   Review of Systems  Constitutional: Negative for chills, fatigue and fever.  HENT: Negative for dental problem, ear pain, hearing loss and trouble swallowing.   Eyes: Negative for visual disturbance.   Respiratory: Negative for cough, chest tightness and shortness of breath.   Cardiovascular: Negative for chest pain, palpitations and leg swelling.  Gastrointestinal: Negative for abdominal pain, blood in stool, diarrhea and nausea.  Endocrine: Negative for cold intolerance, polydipsia, polyphagia and polyuria.  Genitourinary: Negative for dysuria and hematuria.  Musculoskeletal: Negative for arthralgias.  Skin: Negative for rash.  Neurological: Negative for dizziness and headaches.  Psychiatric/Behavioral: Negative for dysphoric mood and sleep disturbance. The patient is not nervous/anxious.     Allergies Patient is allergic to ace inhibitors, codeine, and spironolactone.  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  Surgical History Patient  has a past surgical history that includes Tonsillectomy; Skin surgery; Neck surgery (2005); Total hip arthroplasty (Right, 09/18/2010); Hiatal hernia repair (2011); and Adenoidectomy.  Family History Pateint's family history includes Cancer in his brother; Coronary artery disease in his father and mother; Diabetes in his brother and sister; Heart attack in his father and mother; Hypertension in his father, mother, and sister.  Social History Patient  reports that he quit smoking about 46 years ago. His smoking use included cigarettes and pipe. He has a 4.50 pack-year smoking history. He has never used smokeless tobacco. He reports current alcohol use. He reports that he does not use drugs.    Objective: Vitals:   01/13/20 [id number]  BP: 122/70   Pulse: (!) 56  Temp: (!) 97.3 F (36.3 C)  TempSrc: Temporal  SpO2: 98%  Weight: 223 lb 9.6 oz (101.4 kg)  Height: 6' 0.5" (1.842 m)    Body mass index is 29.91 kg/m.  Physical Exam Vitals reviewed.  Constitutional:      Appearance: Normal appearance. He is normal weight.  HENT:     Head: Normocephalic and atraumatic.     Right Ear: Tympanic membrane, ear canal and external ear normal.     Left Ear: There is impacted cerumen.     Mouth/Throat:     Mouth: Mucous membranes are moist.  Eyes:     Pupils: Pupils are equal, round, and reactive to light.  Cardiovascular:     Rate and Rhythm: Normal rate and regular rhythm.     Heart sounds: Murmur heard.   Pulmonary:     Effort: Pulmonary effort is normal.     Breath sounds: Normal breath sounds.  Abdominal:     General: Abdomen is flat. Bowel sounds are normal.     Palpations: Abdomen is soft.  Musculoskeletal:     Cervical back: Normal range of motion and neck supple.  Neurological:     General: No focal deficit present.     Mental Status: He is alert and oriented to person, place, and time.  Psychiatric:        Mood and Affect: Mood normal.        Behavior: Behavior normal.      Office Visit from 01/13/2020 in [facility]  PHQ-2 Total Score 0         Assessment/plan: 1. Primary hypertension Blood pressure is to goal. Continue current anti-hypertensive medications-catapres .32mBID ,cardizem 3659mdaily ,toprol-xl 5042mID-. Routine lab work will be done today. Recommended routine exercise and healthy diet including DASH diet and mediterranean diet. Encouraged weight loss. F/u in 6 months.   - COMPLETE METABOLIC PANEL WITH GFR; Future  2. NASH (nonalcoholic steatohepatitis) Checking LFTs, has been to goal. On statin and cholesterol to goal.   3. Benign non-nodular prostatic hyperplasia Asymptomatic. Continue flomax.  - PSA; Future  4. Mixed hyperlipidemia Lipid panel reviewed. To goal,  continue lipitor 29m7my.   5. Prediabetes Has been to goal, checking a1c today. Continue metformin 500mg30m, diet and exercise.  - Hemoglobin A1c; Future  6. PAF (paroxysmal atrial fibrillation) (HCC) In NSR. Chronic anticoagulation and rate controlled with beta blocker and cardizem. Followed by cards.   7. Moderate aortic stenosis Stable. Yearly echos.     This visit occurred during the SARS-CoV-2 public health emergency.  Safety protocols were in place, including screening questions prior to the visit, additional usage of staff PPE, and extensive cleaning of exam room while observing appropriate contact time as indicated for disinfecting solutions.     Return in about 6 months (around 07/12/2020) for prediabetes/htn.    AllisOrma FlamingLeBau[place]ona/05/2019

## 2020-01-14 LAB — PSA: PSA: 0.94 ng/mL (ref <=4.0)

## 2020-01-14 LAB — COMPLETE METABOLIC PANEL WITH GFR
AG Ratio: 1.8 (calc) (ref 1.0–2.5)
ALT: 17 U/L (ref 9–46)
AST: 16 U/L (ref 10–35)
Albumin: 4.1 g/dL (ref 3.6–5.1)
Alkaline phosphatase (APISO): 69 U/L (ref 35–144)
BUN: 17 mg/dL (ref 7–25)
CO2: 30 mmol/L (ref 20–32)
Calcium: 9.3 mg/dL (ref 8.6–10.3)
Chloride: 101 mmol/L (ref 98–110)
Creat: 0.89 mg/dL (ref 0.70–1.18)
GFR, Est African American: 99 mL/min/{1.73_m2} (ref >=60)
GFR, Est Non African American: 85 mL/min/{1.73_m2} (ref >=60)
Globulin: 2.3 g/dL (calc) (ref 1.9–3.7)
Glucose, Bld: 96 mg/dL (ref 65–99)
Potassium: 4.3 mmol/L (ref 3.5–5.3)
Sodium: 139 mmol/L (ref 135–146)
Total Bilirubin: 2 mg/dL — ABNORMAL HIGH (ref 0.2–1.2)
Total Protein: 6.4 g/dL (ref 6.1–8.1)

## 2020-01-14 LAB — HEMOGLOBIN A1C
Hgb A1c MFr Bld: 5.6 % of total Hgb (ref <5.7)
Mean Plasma Glucose: 114 (calc)
eAG (mmol/L): 6.3 (calc)

## 2020-01-18 ENCOUNTER — Other Ambulatory Visit: Payer: Medicare Other

## 2020-01-27 ENCOUNTER — Ambulatory Visit (INDEPENDENT_AMBULATORY_CARE_PROVIDER_SITE_OTHER): Payer: Medicare Other | Admitting: General Practice

## 2020-01-27 ENCOUNTER — Other Ambulatory Visit: Payer: Self-pay

## 2020-01-27 DIAGNOSIS — Z7901 Long term (current) use of anticoagulants: Secondary | ICD-10-CM

## 2020-01-27 LAB — POCT INR: INR: 3.4 — AB (ref 2.0–3.0)

## 2020-01-27 NOTE — Patient Instructions (Signed)
Pre visit review using our clinic review tool, if applicable. No additional management support is needed unless otherwise documented below in the visit note. Skip dosage today and then continue to take 1/2 tablet daily except take 1 tablet on Monday/Wed/Fridays and recheck in 6 weeks.  Add 2 to 3 servings of dark green leafy greens weekly.

## 2020-02-18 ENCOUNTER — Ambulatory Visit (INDEPENDENT_AMBULATORY_CARE_PROVIDER_SITE_OTHER): Payer: Medicare Other

## 2020-02-18 ENCOUNTER — Other Ambulatory Visit: Payer: Self-pay

## 2020-02-18 VITALS — BP 100/60 | HR 67 | Temp 97.4°F | Resp 20 | Wt 228.8 lb

## 2020-02-18 DIAGNOSIS — Z0000 Encounter for general adult medical examination without abnormal findings: Secondary | ICD-10-CM

## 2020-02-18 NOTE — Patient Instructions (Addendum)
Steven Shepard , Thank you for taking time to come for your Medicare Wellness Visit. I appreciate your ongoing commitment to your health goals. Please review the following plan we discussed and let me know if I can assist you in the future.   Screening recommendations/referrals: Colonoscopy: Done 12/06/11 Recommended yearly ophthalmology/optometry visit for glaucoma screening and checkup Recommended yearly dental visit for hygiene and checkup  Vaccinations: Influenza vaccine: Done 01/12/20 Up to date Pneumococcal vaccine: Up to date Tdap vaccine: Up to date Shingles vaccine: Shingrix discussed. Please contact your pharmacy for coverage information.    Covid-19: Completed 1/19, 2/9, & 12/18/19  Advanced directives: Please bring a copy of your health care power of attorney and living will to the office at your convenience.  Conditions/risks identified: Lose weight   Next appointment: Follow up in one year for your annual wellness visit.   Preventive Care 23 Years and Older, Male Preventive care refers to lifestyle choices and visits with your health care provider that can promote health and wellness. What does preventive care include?  A yearly physical exam. This is also called an annual well check.  Dental exams once or twice a year.  Routine eye exams. Ask your health care provider how often you should have your eyes checked.  Personal lifestyle choices, including:  Daily care of your teeth and gums.  Regular physical activity.  Eating a healthy diet.  Avoiding tobacco and drug use.  Limiting alcohol use.  Practicing safe sex.  Taking low doses of aspirin every day.  Taking vitamin and mineral supplements as recommended by your health care provider. What happens during an annual well check? The services and screenings done by your health care provider during your annual well check will depend on your age, overall health, lifestyle risk factors, and family history of  disease. Counseling  Your health care provider may ask you questions about your:  Alcohol use.  Tobacco use.  Drug use.  Emotional well-being.  Home and relationship well-being.  Sexual activity.  Eating habits.  History of falls.  Memory and ability to understand (cognition).  Work and work Statistician. Screening  You may have the following tests or measurements:  Height, weight, and BMI.  Blood pressure.  Lipid and cholesterol levels. These may be checked every 5 years, or more frequently if you are over 32 years old.  Skin check.  Lung cancer screening. You may have this screening every year starting at age 75 if you have a 30-pack-year history of smoking and currently smoke or have quit within the past 15 years.  Fecal occult blood test (FOBT) of the stool. You may have this test every year starting at age 31.  Flexible sigmoidoscopy or colonoscopy. You may have a sigmoidoscopy every 5 years or a colonoscopy every 10 years starting at age 4.  Prostate cancer screening. Recommendations will vary depending on your family history and other risks.  Hepatitis C blood test.  Hepatitis B blood test.  Sexually transmitted disease (STD) testing.  Diabetes screening. This is done by checking your blood sugar (glucose) after you have not eaten for a while (fasting). You may have this done every 1-3 years.  Abdominal aortic aneurysm (AAA) screening. You may need this if you are a current or former smoker.  Osteoporosis. You may be screened starting at age 44 if you are at high risk. Talk with your health care provider about your test results, treatment options, and if necessary, the need for more tests. Vaccines  Your health care provider may recommend certain vaccines, such as:  Influenza vaccine. This is recommended every year.  Tetanus, diphtheria, and acellular pertussis (Tdap, Td) vaccine. You may need a Td booster every 10 years.  Zoster vaccine. You may  need this after age 25.  Pneumococcal 13-valent conjugate (PCV13) vaccine. One dose is recommended after age 50.  Pneumococcal polysaccharide (PPSV23) vaccine. One dose is recommended after age 32. Talk to your health care provider about which screenings and vaccines you need and how often you need them. This information is not intended to replace advice given to you by your health care provider. Make sure you discuss any questions you have with your health care provider. Document Released: 03/25/2015 Document Revised: 11/16/2015 Document Reviewed: 12/28/2014 Elsevier Interactive Patient Education  2017 [place] Prevention in the Home Falls can cause injuries. They can happen to people of all ages. There are many things you can do to make your home safe and to help prevent falls. What can I do on the outside of my home?  Regularly fix the edges of walkways and driveways and fix any cracks.  Remove anything that might make you trip as you walk through a door, such as a raised step or threshold.  Trim any bushes or trees on the path to your home.  Use bright outdoor lighting.  Clear any walking paths of anything that might make someone trip, such as rocks or tools.  Regularly check to see if handrails are loose or broken. Make sure that both sides of any steps have handrails.  Any raised decks and porches should have guardrails on the edges.  Have any leaves, snow, or ice cleared regularly.  Use sand or salt on walking paths during winter.  Clean up any spills in your garage right away. This includes oil or grease spills. What can I do in the bathroom?  Use night lights.  Install grab bars by the toilet and in the tub and shower. Do not use towel bars as grab bars.  Use non-skid mats or decals in the tub or shower.  If you need to sit down in the shower, use a plastic, non-slip stool.  Keep the floor dry. Clean up any water that spills on the floor as soon as it  happens.  Remove soap buildup in the tub or shower regularly.  Attach bath mats securely with double-sided non-slip rug tape.  Do not have throw rugs and other things on the floor that can make you trip. What can I do in the bedroom?  Use night lights.  Make sure that you have a light by your bed that is easy to reach.  Do not use any sheets or blankets that are too big for your bed. They should not hang down onto the floor.  Have a firm chair that has side arms. You can use this for support while you get dressed.  Do not have throw rugs and other things on the floor that can make you trip. What can I do in the kitchen?  Clean up any spills right away.  Avoid walking on wet floors.  Keep items that you use a lot in easy-to-reach places.  If you need to reach something above you, use a strong step stool that has a grab bar.  Keep electrical cords out of the way.  Do not use floor polish or wax that makes floors slippery. If you must use wax, use non-skid floor wax.  Do  not have throw rugs and other things on the floor that can make you trip. What can I do with my stairs?  Do not leave any items on the stairs.  Make sure that there are handrails on both sides of the stairs and use them. Fix handrails that are broken or loose. Make sure that handrails are as long as the stairways.  Check any carpeting to make sure that it is firmly attached to the stairs. Fix any carpet that is loose or worn.  Avoid having throw rugs at the top or bottom of the stairs. If you do have throw rugs, attach them to the floor with carpet tape.  Make sure that you have a light switch at the top of the stairs and the bottom of the stairs. If you do not have them, ask someone to add them for you. What else can I do to help prevent falls?  Wear shoes that:  Do not have high heels.  Have rubber bottoms.  Are comfortable and fit you well.  Are closed at the toe. Do not wear sandals.  If you  use a stepladder:  Make sure that it is fully opened. Do not climb a closed stepladder.  Make sure that both sides of the stepladder are locked into place.  Ask someone to hold it for you, if possible.  Clearly mark and make sure that you can see:  Any grab bars or handrails.  First and last steps.  Where the edge of each step is.  Use tools that help you move around (mobility aids) if they are needed. These include:  Canes.  Walkers.  Scooters.  Crutches.  Turn on the lights when you go into a dark area. Replace any light bulbs as soon as they burn out.  Set up your furniture so you have a clear path. Avoid moving your furniture around.  If any of your floors are uneven, fix them.  If there are any pets around you, be aware of where they are.  Review your medicines with your doctor. Some medicines can make you feel dizzy. This can increase your chance of falling. Ask your doctor what other things that you can do to help prevent falls. This information is not intended to replace advice given to you by your health care provider. Make sure you discuss any questions you have with your health care provider. Document Released: 12/23/2008 Document Revised: 08/04/2015 Document Reviewed: 04/02/2014 Elsevier Interactive Patient Education  2017 [employer].

## 2020-02-18 NOTE — Progress Notes (Signed)
Subjective:   Algenis Ballin is a 74 y.o. male who presents for Medicare Annual/Subsequent preventive examination.  Review of Systems     Cardiac Risk Factors include: advanced age (>13mn, >>52women);dyslipidemia;hypertension;obesity (BMI >30kg/m2)     Objective:    Today's Vitals   02/18/20 0918  BP: 100/60  Pulse: 67  Resp: 20  Temp: (!) 97.4 F (36.3 C)  SpO2: 95%  Weight: 228 lb 12.8 oz (103.8 kg)   Body mass index is 30.6 kg/m.  Advanced Directives 02/18/2020 08/19/2019 01/14/2019 08/19/2018 01/08/2018 12/25/2016 06/10/2013  Does Patient Have a Medical Advance Directive? Yes Yes Yes No Yes Yes Patient has advance directive, copy not in chart  Type of Advance Directive HBrins[place]Living will HChilhoweeLiving will HHelenaLiving will - HLyleLiving will HSan AcaciaLiving will HKilkennyLiving will  Does patient want to make changes to medical advance directive? - - No - Patient declined - No - Patient declined No - Patient declined No change requested  Copy of HRice [place]in Chart? No - copy requested - No - copy requested - No - copy requested No - copy requested -  Would patient like information on creating a medical advance directive? - - - No - Patient declined - - -    Current Medications (verified) Outpatient Encounter Medications as of 02/18/2020  Medication Sig  . acetaminophen (TYLENOL) 650 MG CR tablet Take 650 mg by mouth 2 (two) times daily as needed for pain.  .Marland Kitchenatorvastatin (LIPITOR) 20 MG tablet TAKE 1 TABLET(20 MG) BY MOUTH DAILY  . cloNIDine (CATAPRES) 0.3 MG tablet TAKE 1 TABLET BY MOUTH TWICE DAILY  . diltiazem (CARDIZEM CD) 180 MG 24 hr capsule TAKE 2 CAPSULES BY MOUTH EVERY DAY  . Fexofenadine HCl (ALLEGRA PO) Take 1 tablet by mouth every morning.   . folic acid (FOLVITE) 4220MCG tablet Take 400 mcg by mouth 2 (two) times  daily.  . furosemide (LASIX) 40 MG tablet TAKE 1 TABLET BY MOUTH TWICE DAILY AT 8 AM AND AT 2 PM  . metFORMIN (GLUCOPHAGE) 1000 MG tablet Take 0.5 tablets (500 mg total) by mouth 2 (two) times daily with a meal.  . metoprolol succinate (TOPROL-XL) 50 MG 24 hr tablet TAKE 1 TABLET BY MOUTH TWICE DAILY  . Multiple Vitamin (MULTIVITAMIN) capsule Take 1 capsule by mouth daily.  . Multiple Vitamins-Minerals (EMERGEN-C IMMUNE PO) Take 1 tablet by mouth daily.   . Omega-3 Fatty Acids (OMEGA 3 500) 500 MG CAPS Take 500 capsules by mouth daily.  . potassium chloride SA (KLOR-CON) 20 MEQ tablet TAKE 1 TABLET BY MOUTH TWICE DAILY  . Psyllium (METAMUCIL FIBER PO) Take 1 Dose by mouth daily.   . tamsulosin (FLOMAX) 0.4 MG CAPS capsule TAKE 1 CAPSULE BY MOUTH EVERY DAY  . warfarin (COUMADIN) 5 MG tablet TAKE 1/2 TABLET BY MOUTH DAILY EXCEPT 1 TABLET ON MONDAY, WEDNESDAY AND FRIDAY OR AS DIRECTED BY COUMADIN CLINIC   No facility-administered encounter medications on file as of 02/18/2020.    Allergies (verified) Ace inhibitors, Codeine, and Spironolactone   History: Past Medical History:  Diagnosis Date  . Anemia, hemolytic ([place] 01/22/2011  . Aortic stenosis   . 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 COPD (chronic obstructive pulmonary disease) ([place]   . DOE (dyspnea on exertion)   . Elevated bilirubin   . Fatty liver   . History of neck surgery   .  Hyperlipidemia   . Hypertension   . Mild dilation of ascending aorta (HCC)   . OSA on CPAP   . PAF (paroxysmal atrial fibrillation) ([place])   . Pre-diabetes   . Umbilical hernia    Past Surgical History:  Procedure Laterality Date  . ADENOIDECTOMY    . HIATAL HERNIA REPAIR  2011  . NECK SURGERY  2005  . SKIN SURGERY     skin cancer  . TONSILLECTOMY    . TOTAL HIP ARTHROPLASTY Right 09/18/2010   Dr. Maureen Ralphs   Family History  Problem Relation Age of Onset  . Coronary artery disease Mother   . Hypertension Mother   . Heart attack  Mother   . Coronary artery disease Father   . Hypertension Father   . Heart attack Father   . Diabetes Sister   . Hypertension Sister   . Diabetes Brother   . Cancer Brother   . Colon cancer Neg Hx   . Colonic polyp Neg Hx    Social History   Socioeconomic History  . Marital status: Married    Spouse name: Not on file  . Number of children: Not on file  . Years of education: Not on file  . Highest education level: Not on file  Occupational History  . Occupation: Architectural technologist: [employer]    Comment: retired  Tobacco Use  . Smoking status: Former Smoker    Packs/day: 1.50    Years: 3.00    Pack years: 4.50    Types: Cigarettes, Pipe    Quit date: 03/12/1973    Years since quitting: 46.9  . Smokeless tobacco: Never Used  . Tobacco comment: quit appox 36 years ago  Vaping Use  . Vaping Use: Never used  Substance and Sexual Activity  . Alcohol use: Yes    Comment: occasionally, 1-7 drinks per week  . Drug use: No  . Sexual activity: Yes  Other Topics Concern  . Not on file  Social History Narrative  . Not on file   Social Determinants of Health   Financial Resource Strain: Low Risk   . Difficulty of Paying Living Expenses: Not hard at all  Food Insecurity: No Food Insecurity  . Worried About Charity fundraiser in the Last Year: Never true  . Ran Out of Food in the Last Year: Never true  Transportation Needs: No Transportation Needs  . Lack of Transportation (Medical): No  . Lack of Transportation (Non-Medical): No  Physical Activity: Sufficiently Active  . Days of Exercise per Week: 7 days  . Minutes of Exercise per Session: 60 min  Stress: No Stress Concern Present  . Feeling of Stress : Not at all  Social Connections: Moderately Integrated  . Frequency of Communication with Friends and Family: More than three times a week  . Frequency of Social Gatherings with Friends and Family: Three times a week  . Attends Religious Services: 1 to 4 times per year   . Active Member of Clubs or Organizations: No  . Attends Archivist Meetings: Never  . Marital Status: Married    Tobacco Counseling Counseling given: Not Answered Comment: quit appox 36 years ago   Clinical Intake:  Pre-visit preparation completed: Yes  Pain : No/denies pain     BMI - recorded: 30.6 Nutritional Status: BMI > 30  Obese Nutritional Risks: None Diabetes: No  How often do you need to have someone help you when you read instructions, pamphlets, or other  written materials from your doctor or pharmacy?: 1 - Never  Diabetic?No  Interpreter Needed?: No  Information entered by :: Charlott Rakes, LPN   Activities of Daily Living In your present state of health, do you have any difficulty performing the following activities: 02/18/2020  Hearing? N  Vision? N  Difficulty concentrating or making decisions? N  Walking or climbing stairs? N  Dressing or bathing? N  Doing errands, shopping? N  Preparing Food and eating ? N  Using the Toilet? N  In the past six months, have you accidently leaked urine? N  Do you have problems with loss of bowel control? N  Managing your Medications? N  Managing your Finances? N  Housekeeping or managing your Housekeeping? N  Some recent data might be hidden    Patient Care Team: Orma Flaming, MD as PCP - General (Family Medicine) Dorothy Spark, MD as PCP - Cardiology (Cardiology) Sharyne Peach, MD as Consulting Physician (Ophthalmology) Dorothy Spark, MD as Consulting Physician (Cardiology) Jovita Gamma, MD as Consulting Physician (Neurosurgery) Laurence Spates, MD (Inactive) as Consulting Physician (Gastroenterology) Martinique, Amy, MD as Consulting Physician (Dermatology) Rigoberto Noel, MD as Consulting Physician (Pulmonary Disease) Volanda Napoleon, MD as Consulting Physician (Oncology)  Indicate any recent Medical Services you may have received from other than [facility] providers in the past year  (date may be approximate).     Assessment:   This is a routine wellness examination for [facility].  Hearing/Vision screen  Hearing Screening   125Hz  250Hz  500Hz  1000Hz  2000Hz  3000Hz  4000Hz  6000Hz  8000Hz   Right ear:           Left ear:           Comments: No hearing issues  Vision Screening Comments: Follows up with [place] op[facility] Dr  Melissa Noon for annual eye exams  Dietary issues and exercise activities discussed: Current Exercise Habits: Home exercise routine, Type of exercise: walking, Time (Minutes): > 60, Frequency (Times/Week): 7, Weekly Exercise (Minutes/Week): 0  Goals    . Patient Stated     Lose weight    . Reduce salt intake to 2 grams per day or less    . Weight (lb) < 200 lb (90.7 kg)     Would like to lose weight to get to around 220lbs      Depression Screen PHQ 2/9 Scores 02/18/2020 01/13/2020 07/15/2019 01/14/2019 01/08/2018 01/01/2018 12/25/2016  PHQ - 2 Score 0 0 0 0 0 0 0    Fall Risk Fall Risk  02/18/2020 01/13/2020 07/15/2019 02/04/2019 01/14/2019  Falls in the past year? 1 0 0 0 0  Comment - - - Emmi Telephone Survey: data to providers prior to load -  Number falls in past yr: 1 - - - -  Injury with Fall? 1 - - - 0  Comment scrape on knuckles from dog pulling him - - - -  Risk for fall due to : Impaired vision No Fall Risks No Fall Risks - -  Follow up - - - - Falls evaluation completed;Education provided;Falls prevention discussed    FALL RISK PREVENTION PERTAINING TO THE HOME:  Any stairs in or around the home? Yes  If so, are there any without handrails? No  Home free of loose throw rugs in walkways, pet beds, electrical cords, etc? Yes  Adequate lighting in your home to reduce risk of falls? Yes   ASSISTIVE DEVICES UTILIZED TO PREVENT FALLS:  Life alert? No  Use of a cane, walker or  w/c? No  Grab bars in the bathroom? No  Shower chair or bench in shower? Yes  Elevated toilet seat or a handicapped toilet? Yes   TIMED UP AND GO:  Was  the test performed? Yes .  Length of time to ambulate 10 feet: 10 sec.   Gait steady and fast without use of assistive device  Cognitive Function:     6CIT Screen 02/18/2020 01/14/2019 01/08/2018  What Year? 0 points 0 points 0 points  What month? 0 points 0 points 0 points  What time? - 0 points 0 points  Count back from 20 0 points 0 points 0 points  Months in reverse 0 points 0 points 0 points  Repeat phrase 0 points 0 points 0 points  Total Score - 0 0    Immunizations Immunization History  Administered Date(s) Administered  . Fluad Quad(high Dose 65+) 12/17/2018, 01/12/2020  . Influenza Split 12/11/2011, 12/10/2012  . Influenza, High Dose Seasonal PF 01/09/2012, 01/09/2013, 01/11/2014, 12/14/2015, 01/01/2018  . Influenza-Unspecified 01/09/2012, 01/09/2013, 12/10/2013, 01/11/2014, 12/14/2015, 11/25/2016  . PFIZER SARS-COV-2 Vaccination 03/31/2019, 04/21/2019, 12/18/2019  . Pneumococcal Conjugate-13 11/15/2015  . Pneumococcal Polysaccharide-23 09/09/2009, 06/07/2010, 01/11/2018  . Tdap 11/20/2005, 01/11/2018  . Zoster 03/12/2010    TDAP status: Up to date  Flu Vaccine status: Up to date  Pneumococcal vaccine status: Up to date  Covid-19 vaccine status: Completed vaccines  Qualifies for Shingles Vaccine? Yes   Zostavax completed Yes   Shingrix Completed?: No.    Education has been provided regarding the importance of this vaccine. Patient has been advised to call insurance company to determine out of pocket expense if they have not yet received this vaccine. Advised may also receive vaccine at local pharmacy or Health Dept. Verbalized acceptance and understanding.  Screening Tests Health Maintenance  Topic Date Due  . FOOT EXAM  01/02/2019  . OPHTHALMOLOGY EXAM  11/11/2019  . HEMOGLOBIN A1C  07/12/2020  . COLONOSCOPY  03/12/2021  . TETANUS/TDAP  01/12/2028  . INFLUENZA VACCINE  Completed  . COVID-19 Vaccine  Completed  . Hepatitis C Screening  Completed  . PNA  vac Low Risk Adult  Completed    Health Maintenance  Health Maintenance Due  Topic Date Due  . FOOT EXAM  01/02/2019  . OPHTHALMOLOGY EXAM  11/11/2019    Colorectal cancer screening: Type of screening: Colonoscopy. Completed 12/06/11. Repeat every 10 years   Additional Screening:  Hepatitis C Screening: Completed 03/01/17  Vision Screening: Recommended annual ophthalmology exams for early detection of glaucoma and other disorders of the eye. Is the patient up to date with their annual eye exam?  Yes  Who is the provider or what is the name of the office in which the patient attends annual eye exams? Dr Melissa Noon  Dental Screening: Recommended annual dental exams for proper oral hygiene  Community Resource Referral / Chronic Care Management: CRR required this visit?  No   CCM required this visit?  No      Plan:     I have personally reviewed and noted the following in the patient's chart:   . Medical and social history . Use of alcohol, tobacco or illicit drugs  . Current medications and supplements . Functional ability and status . Nutritional status . Physical activity . Advanced directives . List of other physicians . Hospitalizations, surgeries, and ER visits in previous 12 months . Vitals . Screenings to include cognitive, depression, and falls . Referrals and appointments  In addition, I have reviewed  and discussed with patient certain preventive protocols, quality metrics, and best practice recommendations. A written personalized care plan for preventive services as well as general preventive health recommendations were provided to patient.     Willette Brace, LPN   [id number]   Nurse Notes: None

## 2020-02-24 ENCOUNTER — Other Ambulatory Visit: Payer: Self-pay

## 2020-02-24 ENCOUNTER — Ambulatory Visit (INDEPENDENT_AMBULATORY_CARE_PROVIDER_SITE_OTHER): Payer: Medicare Other | Admitting: General Practice

## 2020-02-24 DIAGNOSIS — Z7901 Long term (current) use of anticoagulants: Secondary | ICD-10-CM | POA: Diagnosis not present

## 2020-02-24 LAB — POCT INR: INR: 2.5 (ref 2.0–3.0)

## 2020-02-24 NOTE — Patient Instructions (Addendum)
Pre visit review using our clinic review tool, if applicable. No additional management support is needed unless otherwise documented below in the visit note.  Continue to take 1/2 tablet daily except take 1 tablet on Monday/Wed/Fridays and recheck in 6 weeks.  Add 2 to 3 servings of dark green leafy greens weekly.

## 2020-03-18 ENCOUNTER — Encounter: Payer: Self-pay | Admitting: Family Medicine

## 2020-03-18 ENCOUNTER — Other Ambulatory Visit: Payer: Self-pay

## 2020-03-18 ENCOUNTER — Ambulatory Visit (INDEPENDENT_AMBULATORY_CARE_PROVIDER_SITE_OTHER): Payer: Medicare Other

## 2020-03-18 ENCOUNTER — Ambulatory Visit (INDEPENDENT_AMBULATORY_CARE_PROVIDER_SITE_OTHER): Payer: Medicare Other | Admitting: Family Medicine

## 2020-03-18 VITALS — BP 153/80 | HR 54 | Temp 97.7°F | Ht 72.5 in | Wt 230.4 lb

## 2020-03-18 DIAGNOSIS — M79641 Pain in right hand: Secondary | ICD-10-CM

## 2020-03-18 DIAGNOSIS — S6991XA Unspecified injury of right wrist, hand and finger(s), initial encounter: Secondary | ICD-10-CM | POA: Diagnosis not present

## 2020-03-18 NOTE — Patient Instructions (Signed)
Try voltaren gel over the counter.  Continue to ice  Tylenol for pain.   Urgent referral to dr. Amedeo Plenty he is with [facility]. They will call you with this!  Happy Friday! Dr. Rogers Blocker

## 2020-03-18 NOTE — Progress Notes (Signed)
Patient: Steven Shepard MRN: [id number] DOB: 1945/08/11 PCP: Orma Flaming, MD     Subjective:  Chief Complaint  Patient presents with  . Hand Injury    Swelling; Done while planting flowers. Hit against drill.    HPI: The patient is a 75 y.o. male who presents today for hand injury to right hand. 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 The auger stopped, but his hand kept moving. He didn't have immediate swelling. He was able to finish planting, but it hurt. He iced it and then this AM started to swelling. h ecan not straighten out his right ring finger. He can flex hand, but hurt along metacarpal of his ring finger. He is right handed. He has decreased grip strength as well. He has taken tylenol with little relief.   Review of Systems  Allergies Patient is allergic to ace inhibitors, codeine, and spironolactone.  Past Medical History Patient  has a past medical history of Anemia, hemolytic ([place]) (01/22/2011), Aortic stenosis, 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[place]) (01/23/2011), COPD (chronic obstructive pulmonary disease) (HCC), DOE (dyspnea on exertion), Elevated bilirubin, Fatty liver, History of neck surgery, Hyperlipidemia, Hypertension, Mild dilation of ascending aorta ([place]), OSA on CPAP, PAF (paroxysmal atrial fibrillation) ([place]), Pre-diabetes, and Umbilical hernia.  Surgical History Patient  has a past surgical history that includes Tonsillectomy; Skin surgery; Neck surgery (2005); Total hip arthroplasty (Right, 09/18/2010); Hiatal hernia repair (2011); and Adenoidectomy.  Family History Pateint's family history includes Cancer in his brother; Coronary artery disease in his father and mother; Diabetes in his brother and sister; Heart attack in his father and mother; Hypertension in his father, mother, and sister.  Social History Patient  reports that he quit smoking about 47 years ago. His smoking use included cigarettes  and pipe. He has a 4.50 pack-year smoking history. He has never used smokeless tobacco. He reports current alcohol use. He reports that he does not use drugs.    Objective: Vitals:   03/18/20 1436  BP: (!) 153/80  Pulse: (!) 54  Temp: 97.7 F (36.5 C)  TempSrc: Temporal  SpO2: 98%  Weight: 230 lb 6.4 oz (104.5 kg)  Height: 6' 0.5" (1.842 m)    Body mass index is 30.82 kg/m.  Physical Exam Vitals reviewed.  Constitutional:      General: He is not in acute distress.    Appearance: Normal appearance. He is normal weight. He is not ill-appearing.  Pulmonary:     Effort: Pulmonary effort is normal.  Musculoskeletal:     Comments: Right hand: moderate edema over dorsal side of hand. TTP over right metacarpal/MCP and in space between 4th and 5th MCP. He has intact radial and ulnar pulses. He is able to make a fist, but has decreased grip strength and his right finger doesn't really flex all of the way due to pain on dorsal side. Flexion is intact with both flexor digitorum. Pain with extension.   Neurological:     Mental Status: He is alert.      Hand xray: negative. Fracture.     Assessment/plan: 1. Right hand pain Sprain vs. Tendon injury. RICE therapy. Recommended voltaren, ice and he states tylenol is okay. Doesn't want to splint. Will try to get him into ortho early next week to make sure further imaging not warranted for tendon injury.  - DG Hand Complete Right; Future - Ambulatory referral to Orthopedics   This visit occurred during  the SARS-CoV-2 public health emergency.  Safety protocols were in place, including screening questions prior to the visit, additional usage of staff PPE, and extensive cleaning of exam room while observing appropriate contact time as indicated for disinfecting solutions.     Return if symptoms worsen or fail to improve.   Orma Flaming, MD [place]   03/18/2020

## 2020-03-22 DIAGNOSIS — L821 Other seborrheic keratosis: Secondary | ICD-10-CM | POA: Diagnosis not present

## 2020-03-22 DIAGNOSIS — Z85828 Personal history of other malignant neoplasm of skin: Secondary | ICD-10-CM | POA: Diagnosis not present

## 2020-03-22 DIAGNOSIS — D225 Melanocytic nevi of trunk: Secondary | ICD-10-CM | POA: Diagnosis not present

## 2020-03-22 DIAGNOSIS — D2261 Melanocytic nevi of right upper limb, including shoulder: Secondary | ICD-10-CM | POA: Diagnosis not present

## 2020-03-22 DIAGNOSIS — D1801 Hemangioma of skin and subcutaneous tissue: Secondary | ICD-10-CM | POA: Diagnosis not present

## 2020-03-22 DIAGNOSIS — L570 Actinic keratosis: Secondary | ICD-10-CM | POA: Diagnosis not present

## 2020-03-22 DIAGNOSIS — D2372 Other benign neoplasm of skin of left lower limb, including hip: Secondary | ICD-10-CM | POA: Diagnosis not present

## 2020-03-23 ENCOUNTER — Inpatient Hospital Stay (HOSPITAL_BASED_OUTPATIENT_CLINIC_OR_DEPARTMENT_OTHER): Payer: Medicare Other | Admitting: Hematology & Oncology

## 2020-03-23 ENCOUNTER — Telehealth: Payer: Self-pay

## 2020-03-23 ENCOUNTER — Inpatient Hospital Stay: Payer: Medicare Other | Attending: Hematology & Oncology

## 2020-03-23 ENCOUNTER — Encounter: Payer: Self-pay | Admitting: Hematology & Oncology

## 2020-03-23 ENCOUNTER — Other Ambulatory Visit: Payer: Self-pay

## 2020-03-23 VITALS — BP 118/56 | HR 51 | Temp 97.6°F | Resp 20 | Wt 227.1 lb

## 2020-03-23 DIAGNOSIS — D891 Cryoglobulinemia: Secondary | ICD-10-CM

## 2020-03-23 DIAGNOSIS — D5912 Cold autoimmune hemolytic anemia: Secondary | ICD-10-CM | POA: Insufficient documentation

## 2020-03-23 DIAGNOSIS — Z7901 Long term (current) use of anticoagulants: Secondary | ICD-10-CM | POA: Insufficient documentation

## 2020-03-23 LAB — CBC WITH DIFFERENTIAL (CANCER CENTER ONLY)
Abs Immature Granulocytes: 0.05 10*3/uL (ref 0.00–0.07)
Basophils Absolute: 0 10*3/uL (ref 0.0–0.1)
Basophils Relative: 1 %
Eosinophils Absolute: 0.1 10*3/uL (ref 0.0–0.5)
Eosinophils Relative: 1 %
HCT: UNDETERMINED % (ref 39.0–52.0)
Hemoglobin: 12.4 g/dL — ABNORMAL LOW (ref 13.0–17.0)
Immature Granulocytes: 1 %
Lymphocytes Relative: 29 %
Lymphs Abs: 2.1 10*3/uL (ref 0.7–4.0)
MCH: UNDETERMINED pg (ref 26.0–34.0)
MCHC: UNDETERMINED g/dL (ref 30.0–36.0)
MCV: UNDETERMINED fL (ref 80.0–100.0)
Monocytes Absolute: 0.5 10*3/uL (ref 0.1–1.0)
Monocytes Relative: 7 %
Neutro Abs: 4.4 10*3/uL (ref 1.7–7.7)
Neutrophils Relative %: 61 %
Platelet Count: 196 10*3/uL (ref 150–400)
RBC: UNDETERMINED MIL/uL (ref 4.22–5.81)
RDW: UNDETERMINED % (ref 11.5–15.5)
WBC Count: 7.2 10*3/uL (ref 4.0–10.5)
nRBC: 0 % (ref 0.0–0.2)

## 2020-03-23 LAB — CMP (CANCER CENTER ONLY)
ALT: 21 U/L (ref 0–44)
AST: 17 U/L (ref 15–41)
Albumin: 4.1 g/dL (ref 3.5–5.0)
Alkaline Phosphatase: 66 U/L (ref 38–126)
Anion gap: 5 (ref 5–15)
BUN: 17 mg/dL (ref 8–23)
CO2: 33 mmol/L — ABNORMAL HIGH (ref 22–32)
Calcium: 9.5 mg/dL (ref 8.9–10.3)
Chloride: 102 mmol/L (ref 98–111)
Creatinine: 0.82 mg/dL (ref 0.61–1.24)
GFR, Estimated: >60 mL/min (ref >60)
Glucose, Bld: 125 mg/dL — ABNORMAL HIGH (ref 70–99)
Potassium: 4 mmol/L (ref 3.5–5.1)
Sodium: 140 mmol/L (ref 135–145)
Total Bilirubin: 1.5 mg/dL — ABNORMAL HIGH (ref 0.3–1.2)
Total Protein: 6.2 g/dL — ABNORMAL LOW (ref 6.5–8.1)

## 2020-03-23 LAB — SAVE SMEAR(SSMR), FOR PROVIDER SLIDE REVIEW

## 2020-03-23 LAB — LACTATE DEHYDROGENASE: LDH: 135 U/L (ref 98–192)

## 2020-03-23 NOTE — Progress Notes (Signed)
Hematology and Oncology Follow Up Visit  Steven Shepard [id number] 05/19/1945 75 y.o. 03/23/2020   Principle Diagnosis:   Cold agglutinin disease  Current Therapy:    Observation     Interim History:  Steven Shepard is is back for another visit.  The funny thing right now is that he sprained his wrist in the right hand.  He was planning 750daffodils.  He was using an Nurse, learning disability.  He apparently hit a tree trunk.  Otherwise, he is doing quite well.  He is taking his folic acid.  I told him that he should take a baby aspirin.  I think this would be helpful for you any kind of cyanosis that he may have.  He says this happens when he walks his dog in the cold weather.  He is on Coumadin but I do think that aspirin might be able to help with some vasospasm.  He still has a high level of cold agglutinin.  His titer was 1: >4096.  I told him that if he ever did run into problems, we could certainly treat the cold agglutinin disease.  He has had no issues with fever.  He has had no problems with the coronavirus.  Overall, his performance status is ECOG 1.  Medications:  Current Outpatient Medications:  .  acetaminophen (TYLENOL) 650 MG CR tablet, Take 650 mg by mouth 2 (two) times daily as needed for pain., Disp: , Rfl:  .  atorvastatin (LIPITOR) 20 MG tablet, TAKE 1 TABLET(20 MG) BY MOUTH DAILY, Disp: 90 tablet, Rfl: 3 .  cloNIDine (CATAPRES) 0.3 MG tablet, TAKE 1 TABLET BY MOUTH TWICE DAILY, Disp: 180 tablet, Rfl: 2 .  diltiazem (CARDIZEM CD) 180 MG 24 hr capsule, TAKE 2 CAPSULES BY MOUTH EVERY DAY, Disp: 180 capsule, Rfl: 2 .  Fexofenadine HCl (ALLEGRA PO), Take 1 tablet by mouth every morning. , Disp: , Rfl:  .  folic acid (FOLVITE) 092 MCG tablet, Take 400 mcg by mouth 2 (two) times daily., Disp: , Rfl:  .  furosemide (LASIX) 40 MG tablet, TAKE 1 TABLET BY MOUTH TWICE DAILY AT 8 AM AND AT 2 PM, Disp: 180 tablet, Rfl: 1 .  metFORMIN (GLUCOPHAGE) 1000 MG tablet, Take 0.5 tablets (500 mg  total) by mouth 2 (two) times daily with a meal., Disp: 180 tablet, Rfl: 3 .  metoprolol succinate (TOPROL-XL) 50 MG 24 hr tablet, TAKE 1 TABLET BY MOUTH TWICE DAILY, Disp: 180 tablet, Rfl: 2 .  Multiple Vitamin (MULTIVITAMIN) capsule, Take 1 capsule by mouth daily., Disp: , Rfl:  .  Multiple Vitamins-Minerals (EMERGEN-C IMMUNE PO), Take 1 tablet by mouth daily. , Disp: , Rfl:  .  Omega-3 Fatty Acids (OMEGA 3 500) 500 MG CAPS, Take 500 capsules by mouth daily., Disp: , Rfl:  .  potassium chloride SA (KLOR-CON) 20 MEQ tablet, TAKE 1 TABLET BY MOUTH TWICE DAILY, Disp: 180 tablet, Rfl: 2 .  Psyllium (METAMUCIL FIBER PO), Take 1 Dose by mouth daily. , Disp: , Rfl:  .  tamsulosin (FLOMAX) 0.4 MG CAPS capsule, TAKE 1 CAPSULE BY MOUTH EVERY DAY, Disp: 90 capsule, Rfl: 1 .  warfarin (COUMADIN) 5 MG tablet, TAKE 1/2 TABLET BY MOUTH DAILY EXCEPT 1 TABLET ON MONDAY, WEDNESDAY AND FRIDAY OR AS DIRECTED BY COUMADIN CLINIC, Disp: 30 tablet, Rfl: 3 .  fluorouracil (EFUDEX) 5 % cream, Apply topically. (Patient not taking: Reported on 03/23/2020), Disp: , Rfl:   Allergies:  Allergies  Allergen Reactions  . Ace Inhibitors Other (See Comments)  Other reaction(s): High K+   . Codeine Other (See Comments)    Other reaction(s):Hallucinations  . Spironolactone Other (See Comments)    Increases potassium    Past Medical History, Surgical history, Social history, and Family History were reviewed and updated.  Review of Systems: Review of Systems  Constitutional: Negative.   HENT:  Negative.   Eyes: Negative.   Respiratory: Negative.   Cardiovascular: Negative.   Gastrointestinal: Negative.   Endocrine: Negative.   Genitourinary: Negative.    Musculoskeletal: Negative.   Skin: Negative.   Neurological: Negative.   Hematological: Negative.   Psychiatric/Behavioral: Negative.     Physical Exam:  weight is 227 lb 1.9 oz (103 kg). His oral temperature is 97.6 F (36.4 C). His blood pressure is 118/56  (abnormal) and his pulse is 51 (abnormal). His respiration is 20 and oxygen saturation is 100%.   Wt Readings from Last 3 Encounters:  03/23/20 227 lb 1.9 oz (103 kg)  03/18/20 230 lb 6.4 oz (104.5 kg)  02/18/20 228 lb 12.8 oz (103.8 kg)    Physical Exam Vitals reviewed.  HENT:     Head: Normocephalic and atraumatic.  Eyes:     Pupils: Pupils are equal, round, and reactive to light.  Cardiovascular:     Rate and Rhythm: Normal rate and regular rhythm.     Heart sounds: Normal heart sounds.  Pulmonary:     Effort: Pulmonary effort is normal.     Breath sounds: Normal breath sounds.  Abdominal:     General: Bowel sounds are normal.     Palpations: Abdomen is soft.  Musculoskeletal:        General: No tenderness or deformity. Normal range of motion.     Cervical back: Normal range of motion.  Lymphadenopathy:     Cervical: No cervical adenopathy.  Skin:    General: Skin is warm and dry.     Findings: No erythema or rash.  Neurological:     Mental Status: He is alert and oriented to person, place, and time.  Psychiatric:        Behavior: Behavior normal.        Thought Content: Thought content normal.        Judgment: Judgment normal.      Lab Results  Component Value Date   WBC 7.0 08/19/2019   HGB 12.0 (L) 08/19/2019   HCT Unable to determine due to a cold agglutinin 08/19/2019   MCV Unable to determine due to a cold agglutinin 08/19/2019   PLT 171 08/19/2019     Chemistry      Component Value Date/Time   NA 140 03/23/2020 0955   K 4.0 03/23/2020 0955   CL 102 03/23/2020 0955   CO2 33 (H) 03/23/2020 0955   BUN 17 03/23/2020 0955   CREATININE 0.82 03/23/2020 0955   CREATININE 0.89 01/13/2020 0906      Component Value Date/Time   CALCIUM 9.5 03/23/2020 0955   ALKPHOS 66 03/23/2020 0955   AST 17 03/23/2020 0955   ALT 21 03/23/2020 0955   BILITOT 1.5 (H) 03/23/2020 0955      Impression and Plan: Steven Shepard is a very nice 75 year old white male.  He  has cold agglutinin disease.  As far as I can tell, this is idiopathic.  There is no associated lymphoproliferative disease or autoimmune disease.  Again he really is not symptomatic with the cold agglutinin disease.  He knows to bundle up when he is outside in the cold  weather.  Again, I hope that the baby aspirin will also help.  We will plan to get him back to see [place] in 6 more months.       Volanda Napoleon, MD 1/12/202210:58 AM

## 2020-03-23 NOTE — Telephone Encounter (Signed)
appts made per 03/23/20 los and pt req not to print    aom

## 2020-03-27 LAB — CRYOGLOBULIN

## 2020-03-28 ENCOUNTER — Encounter: Payer: Self-pay | Admitting: Hematology & Oncology

## 2020-03-28 LAB — COLD AGGLUTININ TITER: Cold Agglutinin Titer: >1:4096 {titer}

## 2020-04-01 DIAGNOSIS — M79641 Pain in right hand: Secondary | ICD-10-CM | POA: Diagnosis not present

## 2020-04-05 DIAGNOSIS — M79641 Pain in right hand: Secondary | ICD-10-CM | POA: Diagnosis not present

## 2020-04-06 ENCOUNTER — Other Ambulatory Visit: Payer: Self-pay | Admitting: Cardiology

## 2020-04-06 ENCOUNTER — Ambulatory Visit (INDEPENDENT_AMBULATORY_CARE_PROVIDER_SITE_OTHER): Payer: Medicare Other | Admitting: General Practice

## 2020-04-06 ENCOUNTER — Other Ambulatory Visit: Payer: Self-pay | Admitting: Family Medicine

## 2020-04-06 ENCOUNTER — Other Ambulatory Visit: Payer: Self-pay

## 2020-04-06 DIAGNOSIS — Z7901 Long term (current) use of anticoagulants: Secondary | ICD-10-CM

## 2020-04-06 DIAGNOSIS — R7303 Prediabetes: Secondary | ICD-10-CM

## 2020-04-06 LAB — POCT INR: INR: 2.9 (ref 2.0–3.0)

## 2020-04-06 NOTE — Patient Instructions (Addendum)
Pre visit review using our clinic review tool, if applicable. No additional management support is needed unless otherwise documented below in the visit note.  Continue to take 1/2 tablet daily except take 1 tablet on Monday/Wed/Fridays and recheck in 6 weeks.  Add 2 to 3 servings of dark green leafy greens weekly.

## 2020-04-08 DIAGNOSIS — M79641 Pain in right hand: Secondary | ICD-10-CM | POA: Diagnosis not present

## 2020-04-26 DIAGNOSIS — M79641 Pain in right hand: Secondary | ICD-10-CM | POA: Diagnosis not present

## 2020-05-04 ENCOUNTER — Other Ambulatory Visit: Payer: Self-pay | Admitting: Family Medicine

## 2020-05-04 DIAGNOSIS — Z7901 Long term (current) use of anticoagulants: Secondary | ICD-10-CM

## 2020-05-13 ENCOUNTER — Encounter: Payer: Self-pay | Admitting: Family Medicine

## 2020-05-17 NOTE — Telephone Encounter (Signed)
Pt is requesting to switch Providers from Dr. Meda Coffee to Dr. Harrell Gave.  Will send this message to both Providers to review and advise on Provider switch, and scheduling to follow-up with the pt accordingly thereafter.

## 2020-05-18 ENCOUNTER — Other Ambulatory Visit: Payer: Self-pay

## 2020-05-18 ENCOUNTER — Ambulatory Visit (INDEPENDENT_AMBULATORY_CARE_PROVIDER_SITE_OTHER): Payer: Medicare Other | Admitting: General Practice

## 2020-05-18 DIAGNOSIS — Z7901 Long term (current) use of anticoagulants: Secondary | ICD-10-CM | POA: Diagnosis not present

## 2020-05-18 LAB — POCT INR: INR: 2.9 (ref 2.0–3.0)

## 2020-05-18 NOTE — Patient Instructions (Addendum)
Pre visit review using our clinic review tool, if applicable. No additional management support is needed unless otherwise documented below in the visit note.  Continue to take 1/2 tablet daily except take 1 tablet on Monday/Wed/Fridays and recheck in 6 weeks.  Add 2 to 3 servings of dark green leafy greens weekly.

## 2020-05-20 NOTE — Telephone Encounter (Signed)
Hi team, happy to see this gentleman! I am hopefully going to be over at [facility] by summer, so as long as he is ok with that office we should be all set. Thanks!

## 2020-05-23 NOTE — Telephone Encounter (Signed)
Buford Dresser, MD to Orma Flaming, MD . Dorothy Spark, MD . Meryl Crutch, RN     8:37 AM Note Hi team, happy to see this gentleman! I am hopefully going to be over at [facility] by summer, so as long as he is ok with that office we should be all set. Thanks!     Recall placed for this pt to establish care and see Dr. Harrell Gave as planned for when he would see Dr. Meda Coffee.  This will be at our [place] location where Dr. Harrell Gave will be seeing pts.

## 2020-06-28 DIAGNOSIS — Z85820 Personal history of malignant melanoma of skin: Secondary | ICD-10-CM | POA: Diagnosis not present

## 2020-06-28 DIAGNOSIS — D2271 Melanocytic nevi of right lower limb, including hip: Secondary | ICD-10-CM | POA: Diagnosis not present

## 2020-06-28 DIAGNOSIS — Z85828 Personal history of other malignant neoplasm of skin: Secondary | ICD-10-CM | POA: Diagnosis not present

## 2020-06-28 DIAGNOSIS — D2372 Other benign neoplasm of skin of left lower limb, including hip: Secondary | ICD-10-CM | POA: Diagnosis not present

## 2020-06-28 DIAGNOSIS — D2261 Melanocytic nevi of right upper limb, including shoulder: Secondary | ICD-10-CM | POA: Diagnosis not present

## 2020-06-28 DIAGNOSIS — L821 Other seborrheic keratosis: Secondary | ICD-10-CM | POA: Diagnosis not present

## 2020-06-28 DIAGNOSIS — L570 Actinic keratosis: Secondary | ICD-10-CM | POA: Diagnosis not present

## 2020-06-28 DIAGNOSIS — D225 Melanocytic nevi of trunk: Secondary | ICD-10-CM | POA: Diagnosis not present

## 2020-06-29 ENCOUNTER — Other Ambulatory Visit: Payer: Self-pay

## 2020-06-29 ENCOUNTER — Ambulatory Visit (INDEPENDENT_AMBULATORY_CARE_PROVIDER_SITE_OTHER): Payer: Medicare Other | Admitting: General Practice

## 2020-06-29 DIAGNOSIS — Z7901 Long term (current) use of anticoagulants: Secondary | ICD-10-CM | POA: Diagnosis not present

## 2020-06-29 LAB — POCT INR: INR: 2.9 (ref 2.0–3.0)

## 2020-06-29 NOTE — Patient Instructions (Addendum)
Pre visit review using our clinic review tool, if applicable. No additional management support is needed unless otherwise documented below in the visit note.  Continue to take 1/2 tablet daily except take 1 tablet on Monday/Wed/Fridays and recheck in 6 weeks.  Add 2 to 3 servings of dark green leafy greens weekly.

## 2020-07-05 ENCOUNTER — Other Ambulatory Visit: Payer: Self-pay | Admitting: Family Medicine

## 2020-07-05 ENCOUNTER — Other Ambulatory Visit: Payer: Self-pay | Admitting: Cardiology

## 2020-07-05 DIAGNOSIS — I110 Hypertensive heart disease with heart failure: Secondary | ICD-10-CM

## 2020-07-05 DIAGNOSIS — I5031 Acute diastolic (congestive) heart failure: Secondary | ICD-10-CM

## 2020-07-05 DIAGNOSIS — I480 Paroxysmal atrial fibrillation: Secondary | ICD-10-CM

## 2020-07-05 DIAGNOSIS — E7849 Other hyperlipidemia: Secondary | ICD-10-CM

## 2020-07-06 DIAGNOSIS — H1131 Conjunctival hemorrhage, right eye: Secondary | ICD-10-CM | POA: Diagnosis not present

## 2020-07-13 ENCOUNTER — Ambulatory Visit (INDEPENDENT_AMBULATORY_CARE_PROVIDER_SITE_OTHER): Payer: Medicare Other | Admitting: Family Medicine

## 2020-07-13 ENCOUNTER — Other Ambulatory Visit: Payer: Self-pay

## 2020-07-13 ENCOUNTER — Encounter: Payer: Self-pay | Admitting: Family Medicine

## 2020-07-13 VITALS — BP 120/70 | HR 68 | Temp 97.2°F | Ht 72.5 in | Wt 228.4 lb

## 2020-07-13 DIAGNOSIS — R7303 Prediabetes: Secondary | ICD-10-CM | POA: Diagnosis not present

## 2020-07-13 DIAGNOSIS — Z7901 Long term (current) use of anticoagulants: Secondary | ICD-10-CM

## 2020-07-13 DIAGNOSIS — I10 Essential (primary) hypertension: Secondary | ICD-10-CM | POA: Diagnosis not present

## 2020-07-13 LAB — COMPREHENSIVE METABOLIC PANEL
ALT: 23 U/L (ref 0–53)
AST: 18 U/L (ref 0–37)
Albumin: 4.1 g/dL (ref 3.5–5.2)
Alkaline Phosphatase: 74 U/L (ref 39–117)
BUN: 14 mg/dL (ref 6–23)
CO2: 30 mEq/L (ref 19–32)
Calcium: 9 mg/dL (ref 8.4–10.5)
Chloride: 103 mEq/L (ref 96–112)
Creatinine, Ser: 0.82 mg/dL (ref 0.40–1.50)
GFR: 87.2 mL/min (ref >60.00)
Glucose, Bld: 101 mg/dL — ABNORMAL HIGH (ref 70–99)
Potassium: 4.2 mEq/L (ref 3.5–5.1)
Sodium: 139 mEq/L (ref 135–145)
Total Bilirubin: 1.9 mg/dL — ABNORMAL HIGH (ref 0.2–1.2)
Total Protein: 6.4 g/dL (ref 6.0–8.3)

## 2020-07-13 LAB — HEMOGLOBIN A1C: Hgb A1c MFr Bld: 5.9 % (ref 4.6–6.5)

## 2020-07-13 MED ORDER — WARFARIN SODIUM 5 MG PO TABS: ORAL_TABLET | ORAL | 3 refills | Status: DC

## 2020-07-13 NOTE — Progress Notes (Signed)
Patient: Steven Shepard MRN: [id number] DOB: 05/30/1945 PCP: Orma Flaming, MD     Subjective:  Chief Complaint  Patient presents with  . Hypertension  . PreDiabetes    HPI: The patient is a 75 y.o. male who presents today for HTN and Pre Diabetes. Pt has no concerns today.  Hypertension: Here for follow up of hypertension.  Currently on clonidine .27m bid and cardizem 1866mbid, lasix 4055mid and toprol-xl 50m14mice daily . Has been medically managed by cardiology, dr. NelsMeda Coffeekes medication as prescribed and denies any side effects. Exercise includes walking. Weight has been stable. Denies any chest pain, headaches, shortness of breath, vision changes, swelling in lower extremities.   Prediabetes -takes 500mg56m.. I decreased this from 1000mg 21m Will decrease more today. Do not think he needs this.  -eye exam utd. Seen this month.  -extremely well controlled on diet alone.  -a1c of 5.6 on last check 6 months ago.   Lipid panel last checked 01/2020 and to goal, repeat in the fall at f/u.   PAF On chronic coumadin/rate controlling drugs. Has been in NSR.   Review of Systems  Constitutional: Negative for appetite change, chills, fatigue and fever.  HENT: Negative for dental problem, ear pain, hearing loss and trouble swallowing.   Eyes: Negative for visual disturbance.  Respiratory: Negative for cough, chest tightness and shortness of breath.   Cardiovascular: Negative for chest pain, palpitations and leg swelling.  Gastrointestinal: Negative for abdominal pain, blood in stool, diarrhea and nausea.  Endocrine: Negative for cold intolerance, polydipsia, polyphagia and polyuria.  Genitourinary: Negative for dysuria and hematuria.  Musculoskeletal: Negative for arthralgias.  Skin: Negative for rash.  Neurological: Negative for dizziness and headaches.  Psychiatric/Behavioral: Negative for dysphoric mood and sleep disturbance. The patient is not nervous/anxious.      Allergies Patient is allergic to ace inhibitors, codeine, and spironolactone.  Past Medical History Patient  has a past medical history of Anemia, hemolytic (HCC) ([place]12/2012), Aortic stenosis, 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HCC) ([place]13/2012), 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HCC) ([place]2/2022), COPD (chronic obstructive pulmonary disease) (HCC), [place] (dyspnea on exertion), Elevated bilirubin, Fatty liver, History of neck surgery, Hyperlipidemia, Hypertension, Mild dilation of ascending aorta (HCC), [place] on CPAP, PAF (paroxysmal atrial fibrillation) (HCC), [place]-diabetes, and Umbilical hernia.  Surgical History Patient  has a past surgical history that includes Tonsillectomy; Skin surgery; Neck surgery (2005); Total hip arthroplasty (Right, 09/18/2010); Hiatal hernia repair (2011); and Adenoidectomy.  Family History Pateint's family history includes Cancer in his brother; Coronary artery disease in his father and mother; Diabetes in his brother and sister; Heart attack in his father and mother; Hypertension in his father, mother, and sister.  Social History Patient  reports that he quit smoking about 47 years ago. His smoking use included cigarettes and pipe. He has a 4.50 pack-year smoking history. He has never used smokeless tobacco. He reports current alcohol use. He reports that he does not use drugs.    Objective: Vitals:   07/13/20 0822  BP: 120/70  Pulse: 68  Temp: (!) 97.2 F (36.2 C)  TempSrc: Temporal  SpO2: 97%  Weight: 228 lb 6.4 oz (103.6 kg)  Height: 6' 0.5" (1.842 m)    Body mass index is 30.55 kg/m.  Physical Exam Vitals reviewed.  Constitutional:      Appearance: Normal appearance. He is well-developed and normal weight.  HENT:     Head: Normocephalic and atraumatic.     Right Ear: Tympanic membrane, ear canal and external ear  normal.     Left Ear: Tympanic membrane, ear canal and external ear normal.  Eyes:     Conjunctiva/sclera: Conjunctivae normal.     Pupils: Pupils  are equal, round, and reactive to light.  Neck:     Thyroid: No thyromegaly.     Vascular: No carotid bruit.  Cardiovascular:     Rate and Rhythm: Normal rate and regular rhythm.     Pulses: Normal pulses.     Heart sounds: Murmur (loud systolic) heard.    Pulmonary:     Effort: Pulmonary effort is normal.     Breath sounds: Normal breath sounds.  Abdominal:     General: Abdomen is flat. Bowel sounds are normal. There is no distension.     Palpations: Abdomen is soft.     Tenderness: There is no abdominal tenderness.  Musculoskeletal:     Cervical back: Normal range of motion and neck supple.  Lymphadenopathy:     Cervical: No cervical adenopathy.  Skin:    General: Skin is warm and dry.     Capillary Refill: Capillary refill takes less than 2 seconds.     Findings: No rash.  Neurological:     General: No focal deficit present.     Mental Status: He is alert and oriented to person, place, and time.     Cranial Nerves: No cranial nerve deficit.     Coordination: Coordination normal.     Deep Tendon Reflexes: Reflexes normal.  Psychiatric:        Mood and Affect: Mood normal.        Behavior: Behavior normal.    Flowsheet Row Clinical Support from 02/18/2020 in [place]  PHQ-2 Total Score 0         Assessment/plan: 1. Primary hypertension Blood pressure is to goal. Continue current anti-hypertensive medication- Currently on clonidine .42m bid and cardizem 1864mbid, lasix 4071mid and toprol-xl 76m41mice daily . Has f/u with Dr. ChriHarrell GaveJune or July since Dr. NelsMeda Coffee left.  Refills not given and routine lab work will be done today. Recommended routine exercise and healthy diet including DASH diet and mediterranean diet. Encouraged weight loss. F/u in 6 months.   - CBC with Differential/Platelet - Comprehensive metabolic panel  2. Prediabetes -decreasing his metformin down to 500mg76m. Do not think he likely needs to be on  medication, but we will see how he does with downward titration. Repeat a1c at f/u.   - Hemoglobin A1c  3. Long term (current) use of anticoagulants Will send in 90 day supply as he has only been getting limited supply. Regular f/u with coumadin clinc.  - warfarin (COUMADIN) 5 MG tablet; TAKE 1/2 TABLET BY MOUTH EVERY DAY EXCEPT 1 TABLET ON MONDAY, WEDNESDAY, AND FRIDAY OR AS DIRECTED  Dispense: 90 tablet; Refill: 3      Return in about 6 months (around 01/13/2021) for routine f/u wiht dr. parkeJerline Paindiabetes/lipid/htn) .   AllisOrma FlamingLeBauPiketon4/2022

## 2020-07-13 NOTE — Patient Instructions (Signed)
-  I would decrease your metformin down to 572m/day. I really do not even think you may need this.  -recheck a1c in 6 months time with dr. PJerline Pain   -blood pressure is perfect! Keep up the good work!   -im going to miss you!   Dr. WRogers Blocker

## 2020-07-14 NOTE — Addendum Note (Signed)
Addended by: Orma Flaming on: 07/14/2020 09:14 AM   Modules accepted: Orders

## 2020-08-10 ENCOUNTER — Ambulatory Visit (INDEPENDENT_AMBULATORY_CARE_PROVIDER_SITE_OTHER): Payer: Medicare Other | Admitting: General Practice

## 2020-08-10 ENCOUNTER — Other Ambulatory Visit: Payer: Self-pay

## 2020-08-10 DIAGNOSIS — Z7901 Long term (current) use of anticoagulants: Secondary | ICD-10-CM

## 2020-08-10 LAB — POCT INR: INR: 2.9 (ref 2.0–3.0)

## 2020-08-10 NOTE — Patient Instructions (Signed)
Pre visit review using our clinic review tool, if applicable. No additional management support is needed unless otherwise documented below in the visit note.  Continue to take 1/2 tablet daily except take 1 tablet on Monday/Wed/Fridays and recheck in 6 weeks.  Add 2 to 3 servings of dark green leafy greens weekly.

## 2020-08-22 DIAGNOSIS — L570 Actinic keratosis: Secondary | ICD-10-CM | POA: Diagnosis not present

## 2020-08-22 DIAGNOSIS — Z85828 Personal history of other malignant neoplasm of skin: Secondary | ICD-10-CM | POA: Diagnosis not present

## 2020-08-22 DIAGNOSIS — L821 Other seborrheic keratosis: Secondary | ICD-10-CM | POA: Diagnosis not present

## 2020-08-22 DIAGNOSIS — L814 Other melanin hyperpigmentation: Secondary | ICD-10-CM | POA: Diagnosis not present

## 2020-09-21 ENCOUNTER — Inpatient Hospital Stay: Payer: Medicare Other | Attending: Family

## 2020-09-21 ENCOUNTER — Other Ambulatory Visit: Payer: Self-pay

## 2020-09-21 ENCOUNTER — Inpatient Hospital Stay (HOSPITAL_BASED_OUTPATIENT_CLINIC_OR_DEPARTMENT_OTHER): Payer: Medicare Other | Admitting: Family

## 2020-09-21 ENCOUNTER — Ambulatory Visit (HOSPITAL_COMMUNITY): Payer: Medicare Other | Attending: Physician Assistant

## 2020-09-21 ENCOUNTER — Encounter: Payer: Self-pay | Admitting: Family

## 2020-09-21 ENCOUNTER — Telehealth: Payer: Self-pay

## 2020-09-21 VITALS — BP 119/59 | HR 52 | Temp 98.1°F | Resp 16 | Ht 72.0 in | Wt 230.8 lb

## 2020-09-21 DIAGNOSIS — I77810 Thoracic aortic ectasia: Secondary | ICD-10-CM

## 2020-09-21 DIAGNOSIS — D891 Cryoglobulinemia: Secondary | ICD-10-CM

## 2020-09-21 DIAGNOSIS — D5912 Cold autoimmune hemolytic anemia: Secondary | ICD-10-CM | POA: Diagnosis not present

## 2020-09-21 DIAGNOSIS — I350 Nonrheumatic aortic (valve) stenosis: Secondary | ICD-10-CM | POA: Insufficient documentation

## 2020-09-21 DIAGNOSIS — I5032 Chronic diastolic (congestive) heart failure: Secondary | ICD-10-CM | POA: Diagnosis not present

## 2020-09-21 LAB — CBC WITH DIFFERENTIAL (CANCER CENTER ONLY)
Abs Immature Granulocytes: 0.1 10*3/uL — ABNORMAL HIGH (ref 0.00–0.07)
Basophils Absolute: 0 10*3/uL (ref 0.0–0.1)
Basophils Relative: 0 %
Eosinophils Absolute: 0.1 10*3/uL (ref 0.0–0.5)
Eosinophils Relative: 1 %
HCT: 36.5 % — ABNORMAL LOW (ref 39.0–52.0)
Hemoglobin: 12 g/dL — ABNORMAL LOW (ref 13.0–17.0)
Immature Granulocytes: 1 %
Lymphocytes Relative: 29 %
Lymphs Abs: 2.1 10*3/uL (ref 0.7–4.0)
MCH: 30 pg (ref 26.0–34.0)
MCHC: 32.9 g/dL (ref 30.0–36.0)
MCV: 91.3 fL (ref 80.0–100.0)
Monocytes Absolute: 0.1 10*3/uL (ref 0.1–1.0)
Monocytes Relative: 9 %
Neutro Abs: 4.2 10*3/uL (ref 1.7–7.7)
Neutrophils Relative %: 60 %
Platelet Count: 180 10*3/uL (ref 150–400)
RBC: 4 MIL/uL — ABNORMAL LOW (ref 4.22–5.81)
RDW: 14.5 % (ref 11.5–15.5)
WBC Count: 7.1 10*3/uL (ref 4.0–10.5)
nRBC: 0 % (ref 0.0–0.2)

## 2020-09-21 LAB — SAMPLE TO BLOOD BANK

## 2020-09-21 LAB — ECHOCARDIOGRAM COMPLETE
AR max vel: 1.17 cm2
AV Area VTI: 1.16 cm2
AV Area mean vel: 1.17 cm2
AV Mean grad: 20 mmHg
AV Peak grad: 40.5 mmHg
Ao pk vel: 3.18 m/s
Area-P 1/2: 3.76 cm2
S' Lateral: 3.1 cm

## 2020-09-21 LAB — CMP (CANCER CENTER ONLY)
ALT: 18 U/L (ref 0–44)
AST: 17 U/L (ref 15–41)
Albumin: 4 g/dL (ref 3.5–5.0)
Alkaline Phosphatase: 70 U/L (ref 38–126)
Anion gap: 4 — ABNORMAL LOW (ref 5–15)
BUN: 17 mg/dL (ref 8–23)
CO2: 31 mmol/L (ref 22–32)
Calcium: 9.4 mg/dL (ref 8.9–10.3)
Chloride: 102 mmol/L (ref 98–111)
Creatinine: 0.91 mg/dL (ref 0.61–1.24)
GFR, Estimated: >60 mL/min (ref >60)
Glucose, Bld: 145 mg/dL — ABNORMAL HIGH (ref 70–99)
Potassium: 3.9 mmol/L (ref 3.5–5.1)
Sodium: 137 mmol/L (ref 135–145)
Total Bilirubin: 1.6 mg/dL — ABNORMAL HIGH (ref 0.3–1.2)
Total Protein: 6.1 g/dL — ABNORMAL LOW (ref 6.5–8.1)

## 2020-09-21 LAB — LACTATE DEHYDROGENASE: LDH: 155 U/L (ref 98–192)

## 2020-09-21 NOTE — Telephone Encounter (Signed)
Appts made per 09/21/20 los and pt called   Steven Shepard

## 2020-09-21 NOTE — Progress Notes (Signed)
Hematology and Oncology Follow Up Visit  Steven Shepard [id number] 03-02-1946 75 y.o. 09/21/2020   Principle Diagnosis:  Cold agglutinin disease   Current Therapy:        Observation   Interim History:  [name] is here today for follow-up. He is doing well and has no complaints at this time.  He just got back from a wonderful time at the beach.  Hgb is stable at 12.0, MCV 91, platelets 180 and WBC count 7.1. Cold agglutinin titer pending.  No fever, chills, n/v, cough, rash, dizziness, SOB, chest pain, palpitations, abdominal pain or changes in bowel or bladder habits.  He has mild swelling in his feet and ankles. Pedal pulses are 2+. No redness or edema on exam.  Intermittent numbness and tingling in his toes is unchanged.  No falls or syncope to report.  She has maintained a good appetite and is staying well hydrated. His weight is stable at 230 lbs.   ECOG Performance Status: 0 - Asymptomatic  Medications:  Allergies as of 09/21/2020       Reactions   Ace Inhibitors Other (See Comments)   Other reaction(s): High K+   Codeine Other (See Comments)   Other reaction(s):Hallucinations   Spironolactone Other (See Comments)   Increases potassium        Medication List        Accurate as of September 21, 2020 11:32 AM. If you have any questions, ask your nurse or doctor.          acetaminophen 650 MG CR tablet Commonly known as: TYLENOL Take 650 mg by mouth 2 (two) times daily as needed for pain.   ALLEGRA PO Take 1 tablet by mouth every morning.   atorvastatin 20 MG tablet Commonly known as: LIPITOR TAKE 1 TABLET(20 MG) BY MOUTH DAILY   cloNIDine 0.3 MG tablet Commonly known as: CATAPRES TAKE 1 TABLET BY MOUTH TWICE DAILY   diltiazem 180 MG 24 hr capsule Commonly known as: CARDIZEM CD TAKE 2 CAPSULES BY MOUTH EVERY DAY   EMERGEN-C IMMUNE PO Take 1 tablet by mouth daily.   fluorouracil 5 % cream Commonly known as: EFUDEX Apply topically.    folic acid 280 MCG tablet Commonly known as: FOLVITE Take 400 mcg by mouth 2 (two) times daily.   furosemide 40 MG tablet Commonly known as: LASIX TAKE 1 TABLET BY MOUTH TWICE DAILY AT 8 AM AND AT 2 PM   METAMUCIL FIBER PO Take 1 Dose by mouth daily.   metFORMIN 500 MG tablet Commonly known as: GLUCOPHAGE Take 1 tablet (500 mg total) by mouth daily with breakfast.   metoprolol succinate 50 MG 24 hr tablet Commonly known as: TOPROL-XL TAKE 1 TABLET BY MOUTH TWICE DAILY   multivitamin capsule Take 1 capsule by mouth daily.   Omega 3 500 500 MG Caps Take 500 capsules by mouth daily.   potassium chloride SA 20 MEQ tablet Commonly known as: KLOR-CON TAKE 1 TABLET BY MOUTH TWICE DAILY   tamsulosin 0.4 MG Caps capsule Commonly known as: FLOMAX TAKE 1 CAPSULE BY MOUTH EVERY DAY   warfarin 5 MG tablet Commonly known as: COUMADIN Take as directed by the anticoagulation clinic. If you are unsure how to take this medication, talk to your nurse or doctor. Original instructions: TAKE 1/2 TABLET BY MOUTH EVERY DAY EXCEPT 1 TABLET ON MONDAY, WEDNESDAY, AND FRIDAY OR AS DIRECTED        Allergies:  Allergies  Allergen Reactions   Ace Inhibitors Other (  See Comments)    Other reaction(s): High K+    Codeine Other (See Comments)    Other reaction(s):Hallucinations   Spironolactone Other (See Comments)    Increases potassium    Past Medical History, Surgical history, Social history, and Family History were reviewed and updated.  Review of Systems: All other 10 point review of systems is negative.   Physical Exam:  height is 6' (1.829 m) and weight is 230 lb 12.8 oz (104.7 kg). His oral temperature is 98.1 F (36.7 C). His blood pressure is 119/59 (abnormal) and his pulse is 52 (abnormal). His respiration is 16 and oxygen saturation is 98%.   Wt Readings from Last 3 Encounters:  09/21/20 230 lb 12.8 oz (104.7 kg)  07/13/20 228 lb 6.4 oz (103.6 kg)  03/23/20 227 lb 1.9 oz  (103 kg)    Ocular: Sclerae unicteric, pupils equal, round and reactive to light Ear-nose-throat: Oropharynx clear, dentition fair Lymphatic: No cervical or supraclavicular adenopathy Lungs no rales or rhonchi, good excursion bilaterally Heart regular rate and rhythm, no murmur appreciated Abd soft, nontender, positive bowel sounds MSK no focal spinal tenderness, no joint edema Neuro: non-focal, well-oriented, appropriate affect Breasts: Deferred   Lab Results  Component Value Date   WBC 7.2 03/23/2020   HGB 12.4 (L) 03/23/2020   HCT Unable to determine due to a cold agglutinin 03/23/2020   MCV Unable to determine due to a cold agglutinin 03/23/2020   PLT 196 03/23/2020   No results found for: FERRITIN, IRON, TIBC, UIBC, IRONPCTSAT Lab Results  Component Value Date   RETICCTPCT 1.9 12/25/2013   RBC Unable to determine due to a cold agglutinin 03/23/2020   RETICCTABS 78.5 12/25/2013   No results found for: [facility] Lab Results  Component Value Date   IGGSERUM 1,011 08/19/2019   IGA 215 08/19/2019   IGMSERUM 167 (H) 08/19/2019   No results found for: Odetta Pink, SPEI   Chemistry      Component Value Date/Time   NA 139 07/13/2020 0923   K 4.2 07/13/2020 0923   CL 103 07/13/2020 0923   CO2 30 07/13/2020 0923   BUN 14 07/13/2020 0923   CREATININE 0.82 07/13/2020 0923   CREATININE 0.82 03/23/2020 0955   CREATININE 0.89 01/13/2020 0906      Component Value Date/Time   CALCIUM 9.0 07/13/2020 0923   ALKPHOS 74 07/13/2020 0923   AST 18 07/13/2020 0923   AST 17 03/23/2020 0955   ALT 23 07/13/2020 0923   ALT 21 03/23/2020 0955   BILITOT 1.9 (H) 07/13/2020 0923   BILITOT 1.5 (H) 03/23/2020 0955       Impression and Plan: [name] is a very pleasant 75 yo caucasian gentleman with cold agglutinin disease. So far, with work up, this appears to be idiopathic.  He continues to do  well and has no complaints at this time.  Cold agglutinin titer is pending.  Follow-up in 6 months.  He can contact our office with any questions or concerns.   Laverna Peace, NP 7/13/202211:32 AM

## 2020-09-22 ENCOUNTER — Encounter: Payer: Self-pay | Admitting: Hematology & Oncology

## 2020-09-22 LAB — COLD AGGLUTININ TITER: Cold Agglutinin Titer: 1:4096 {titer} — ABNORMAL HIGH

## 2020-10-03 ENCOUNTER — Other Ambulatory Visit: Payer: Self-pay

## 2020-10-03 DIAGNOSIS — E7849 Other hyperlipidemia: Secondary | ICD-10-CM

## 2020-10-03 DIAGNOSIS — I110 Hypertensive heart disease with heart failure: Secondary | ICD-10-CM

## 2020-10-03 MED ORDER — CLONIDINE HCL 0.3 MG PO TABS: 0.3000 mg | ORAL_TABLET | Freq: Two times a day (BID) | ORAL | 0 refills | Status: DC

## 2020-10-03 MED ORDER — METOPROLOL SUCCINATE ER 50 MG PO TB24: 50.0000 mg | ORAL_TABLET | Freq: Two times a day (BID) | ORAL | 0 refills | Status: DC

## 2020-10-03 MED ORDER — POTASSIUM CHLORIDE CRYS ER 20 MEQ PO TBCR: 20.0000 meq | EXTENDED_RELEASE_TABLET | Freq: Two times a day (BID) | ORAL | 0 refills | Status: DC

## 2020-10-05 ENCOUNTER — Encounter: Payer: Medicare Other | Admitting: Physician Assistant

## 2020-10-05 ENCOUNTER — Ambulatory Visit: Payer: Medicare Other | Admitting: General Practice

## 2020-10-05 DIAGNOSIS — Z20822 Contact with and (suspected) exposure to covid-19: Secondary | ICD-10-CM | POA: Diagnosis not present

## 2020-10-05 NOTE — Progress Notes (Signed)
I have reviewed and agree with note, evaluation, plan.   Chirstopher Iovino, MD  

## 2020-10-05 NOTE — Progress Notes (Signed)
Erroneous. Patient trying to do visit for wife. Got her put on schedule and MyChart reactivated.

## 2020-10-07 ENCOUNTER — Telehealth: Payer: Medicare Other | Admitting: Family

## 2020-10-07 ENCOUNTER — Encounter: Payer: Self-pay | Admitting: Family

## 2020-10-07 DIAGNOSIS — U071 COVID-19: Secondary | ICD-10-CM | POA: Diagnosis not present

## 2020-10-07 MED ORDER — BENZONATATE 100 MG PO CAPS: 100.0000 mg | ORAL_CAPSULE | Freq: Three times a day (TID) | ORAL | 0 refills | Status: DC | PRN

## 2020-10-07 MED ORDER — MOLNUPIRAVIR EUA 200MG CAPSULE: 4.0000 | ORAL_CAPSULE | Freq: Two times a day (BID) | ORAL | 0 refills | Status: AC

## 2020-10-07 NOTE — Progress Notes (Signed)
Virtual Visit Consent   Deondrick Searls, you are scheduled for a virtual visit with a [facility] provider today.     Just as with appointments in the office, your consent must be obtained to participate.  Your consent will be active for this visit and any virtual visit you may have with one of our providers in the next 365 days.     If you have a MyChart account, a copy of this consent can be sent to you electronically.  All virtual visits are billed to your insurance company just like a traditional visit in the office.    As this is a virtual visit, video technology does not allow for your provider to perform a traditional examination.  This may limit your provider's ability to fully assess your condition.  If your provider identifies any concerns that need to be evaluated in person or the need to arrange testing (such as labs, EKG, etc.), we will make arrangements to do so.     Although advances in technology are sophisticated, we cannot ensure that it will always work on either your end or our end.  If the connection with a video visit is poor, the visit may have to be switched to a telephone visit.  With either a video or telephone visit, we are not always able to ensure that we have a secure connection.     I need to obtain your verbal consent now.   Are you willing to proceed with your visit today?    Arther Heisler has provided verbal consent on 10/07/2020 for a virtual visit (video or telephone).   Evelina Dun, FNP   Date: 10/07/2020 10:11 AM   Virtual Visit via Video Note   I, Evelina Dun, connected with  Steven Shepard  ([id number], [birth year]) on 10/07/20 at 10:15 AM EDT by a video-enabled telemedicine application and verified that I am speaking with the correct person using two identifiers.  Location: Patient: Virtual Visit Location Patient: Home Provider: Virtual Visit Location Provider: Home   I discussed the limitations of evaluation and management by  telemedicine and the availability of in person appointments. The patient expressed understanding and agreed to proceed.    History of Present Illness: Math Brazie is a 75 y.o. who identifies as a male who was assigned male at birth, and is being seen today for cough that started last night. States he took a COVID test last night that was a faint line. States he tested this morning it was negative. His wife is currently positive.   HPI: Cough This is a new problem. The current episode started in the past 7 days. The problem has been waxing and waning. The problem occurs every few minutes. The cough is Productive of sputum. Associated symptoms include a fever, nasal congestion, postnasal drip, rhinorrhea and wheezing. Pertinent negatives include no chills, ear congestion, ear pain, headaches or shortness of breath. The symptoms are aggravated by lying down. He has tried rest (tylenol) for the symptoms. The treatment provided mild relief.   Problems:  Patient Active Problem List   Diagnosis Date Noted   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[place]) 03/23/2020   Prediabetes 01/15/2019   Aortic stenosis, moderate 01/14/2019   Hypertension    Gallstone 04/26/2016   NASH (nonalcoholic steatohepatitis) 04/26/2016   Obesity (BMI 30.0-34.9) 04/26/2016   Osteoarthrosis involving lower leg 29/51/8841   Acute diastolic heart failure ([place]) 02/16/2015   Benign non-nodular prostatic hyperplasia 01/11/2014   Long term (current) use of anticoagulants 06/20/2010  PAF (paroxysmal atrial fibrillation) ([place]) 06/09/2010   Atherosclerosis of native arteries of extremity with rest pain ([place]) 02/14/2010   Spinal stenosis of lumbar region with neurogenic claudication 02/14/2010   Thoracic or lumbosacral neuritis or radiculitis, unspecified 01/10/2010   Chronic obstructive pulmonary disease ([place]) 08/10/2009   Obstructive sleep apnea syndrome, on CPAP 03/02/2008   Hyperlipidemia 03/01/2008    Allergies:  Allergies   Allergen Reactions   Ace Inhibitors Other (See Comments)    Other reaction(s): High K+    Codeine Other (See Comments)    Other reaction(s):Hallucinations   Spironolactone Other (See Comments)    Increases potassium   Medications:  Current Outpatient Medications:    benzonatate (TESSALON PERLES) 100 MG capsule, Take 1 capsule (100 mg total) by mouth 3 (three) times daily as needed., Disp: 20 capsule, Rfl: 0   molnupiravir EUA 200 mg CAPS, Take 4 capsules (800 mg total) by mouth 2 (two) times daily for 5 days., Disp: 40 capsule, Rfl: 0   acetaminophen (TYLENOL) 650 MG CR tablet, Take 650 mg by mouth 2 (two) times daily as needed for pain., Disp: , Rfl:    atorvastatin (LIPITOR) 20 MG tablet, TAKE 1 TABLET(20 MG) BY MOUTH DAILY, Disp: 90 tablet, Rfl: 3   cloNIDine (CATAPRES) 0.3 MG tablet, Take 1 tablet (0.3 mg total) by mouth 2 (two) times daily., Disp: 30 tablet, Rfl: 0   diltiazem (CARDIZEM CD) 180 MG 24 hr capsule, TAKE 2 CAPSULES BY MOUTH EVERY DAY, Disp: 180 capsule, Rfl: 2   Fexofenadine HCl (ALLEGRA PO), Take 1 tablet by mouth every morning. , Disp: , Rfl:    fluorouracil (EFUDEX) 5 % cream, Apply topically. (Patient not taking: No sig reported), Disp: , Rfl:    folic acid (FOLVITE) 287 MCG tablet, Take 400 mcg by mouth 2 (two) times daily., Disp: , Rfl:    furosemide (LASIX) 40 MG tablet, TAKE 1 TABLET BY MOUTH TWICE DAILY AT 8 AM AND AT 2 PM, Disp: 180 tablet, Rfl: 1   metFORMIN (GLUCOPHAGE) 500 MG tablet, Take 1 tablet (500 mg total) by mouth daily with breakfast., Disp: 90 tablet, Rfl: 3   metoprolol succinate (TOPROL-XL) 50 MG 24 hr tablet, Take 1 tablet (50 mg total) by mouth 2 (two) times daily. Take with or immediately following a meal., Disp: 30 tablet, Rfl: 0   Multiple Vitamin (MULTIVITAMIN) capsule, Take 1 capsule by mouth daily., Disp: , Rfl:    Multiple Vitamins-Minerals (EMERGEN-C IMMUNE PO), Take 1 tablet by mouth daily. , Disp: , Rfl:    Omega-3 Fatty Acids (OMEGA 3  500) 500 MG CAPS, Take 500 capsules by mouth daily., Disp: , Rfl:    potassium chloride SA (KLOR-CON) 20 MEQ tablet, Take 1 tablet (20 mEq total) by mouth 2 (two) times daily., Disp: 30 tablet, Rfl: 0   Psyllium (METAMUCIL FIBER PO), Take 1 Dose by mouth daily. , Disp: , Rfl:    tamsulosin (FLOMAX) 0.4 MG CAPS capsule, TAKE 1 CAPSULE BY MOUTH EVERY DAY, Disp: 90 capsule, Rfl: 1   warfarin (COUMADIN) 5 MG tablet, TAKE 1/2 TABLET BY MOUTH EVERY DAY EXCEPT 1 TABLET ON MONDAY, WEDNESDAY, AND FRIDAY OR AS DIRECTED, Disp: 90 tablet, Rfl: 3  Observations/Objective: Patient is well-developed, well-nourished in no acute distress.  Resting comfortably  at home.  Head is normocephalic, atraumatic.  No labored breathing.  Speech is clear and coherent with logical content.  Patient is alert and oriented at baseline.  No SOB or distress noted,  Assessment and Plan: 1. COVID-19 virus detected - molnupiravir EUA 200 mg CAPS; Take 4 capsules (800 mg total) by mouth 2 (two) times daily for 5 days.  Dispense: 40 capsule; Refill: 0 - benzonatate (TESSALON PERLES) 100 MG capsule; Take 1 capsule (100 mg total) by mouth 3 (three) times daily as needed.  Dispense: 20 capsule; Refill: 0 COVID positive, rest, force fluids, tylenol as needed, Quarantine for at least 5 days and fever free, report any worsening symptoms such as increased shortness of breath, swelling, or continued high fevers.  Possible adverse effects discussed  Discussed not to hesitate to go to ED if symptoms worsen.   Follow Up Instructions: I discussed the assessment and treatment plan with the patient. The patient was provided an opportunity to ask questions and all were answered. The patient agreed with the plan and demonstrated an understanding of the instructions.  A copy of instructions were sent to the patient via MyChart.  The patient was advised to call back or seek an in-person evaluation if the symptoms worsen or if the condition fails  to improve as anticipated.  Time:  I spent 12 minutes with the patient via telehealth technology discussing the above problems/concerns.    Evelina Dun, FNP

## 2020-10-13 ENCOUNTER — Encounter: Payer: Self-pay | Admitting: Family Medicine

## 2020-10-17 NOTE — Progress Notes (Signed)
Cardiology Office Note:    Date:  10/19/2020   ID:  Steven Shepard, DOB [birth year], MRN [id number]  PCP:  Orma Flaming, MD  Cardiologist:  Buford Dresser, MD  Referring MD: Orma Flaming, MD   CC: establish care   History of Present Illness:    Steven Shepard is a 75 y.o. male with a hx of hemolytic anemia, aortic stenosis, cold agglutinin disease, COPD, hyperlipidemia, hypertension, OSA on CPAP, PAF, and pre-diabetes, who is seen for follow-up. He was previously a patient of Dr. Meda Coffee and Ermalinda Barrios.  Family history: His father (67 yo) and mother (46 yo) died of heart attacks. Prior cardiac testing and/or incidental findings on other testing (ie coronary calcium): Heart Cath (20-30 yrs ago), no stents Exercise level: Regularly walks his dog. Reports mild chest pain and shortness of breath due to deconditioning.  He would like to discuss his overall health mostly due to his family history. Both of his parents died of heart attacks. Lately he has not been monitoring blood pressure at home, but he does own a cuff.   The only chest pain or shortness of breath he experiences he attributes to being deconditioned. These symptoms do not seem to concern him.  He endorses occasional ankle edema, but he does not know the cause. Walking typically helps the edema dissipate, and he is on a diuretic.   Wears a CPAP, and loves it aside from rare sinus infections. Dr. Elsworth Soho follows his CPAP management.  Normally he takes Coumadin in the evening. Dr. Meda Coffee prescribed Coumadin because at the time, other medication options were new and unknown. Clonidine has been in his regimen for as long as he can remember. He would prefer medication taken once daily. We discussed medication management options extensively today.  Sometimes he has blood in his stool, but notes it usually occurs once and is not a major bleeding issue.  He denies any palpitations. No lightheadedness, headaches,  syncope, orthopnea, or PND.   Past Medical History:  Diagnosis Date   Anemia, hemolytic ([place]) 01/22/2011   Aortic stenosis    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[place]) 01/23/2011   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[place]) 03/23/2020   COPD (chronic obstructive pulmonary disease) (HCC)    DOE (dyspnea on exertion)    Elevated bilirubin    Fatty liver    History of neck surgery    Hyperlipidemia    Hypertension    Mild dilation of ascending aorta (HCC)    OSA on CPAP    PAF (paroxysmal atrial fibrillation) ([place])    Pre-diabetes    Umbilical hernia     Past Surgical History:  Procedure Laterality Date   ADENOIDECTOMY     HIATAL HERNIA REPAIR  2011   NECK SURGERY  2005   SKIN SURGERY     skin cancer   TONSILLECTOMY     TOTAL HIP ARTHROPLASTY Right 09/18/2010   Dr. Maureen Ralphs    Current Medications: Current Outpatient Medications on File Prior to Visit  Medication Sig   acetaminophen (TYLENOL) 650 MG CR tablet Take 650 mg by mouth 2 (two) times daily as needed for pain.   atorvastatin (LIPITOR) 20 MG tablet TAKE 1 TABLET(20 MG) BY MOUTH DAILY   benzonatate (TESSALON PERLES) 100 MG capsule Take 1 capsule (100 mg total) by mouth 3 (three) times daily as needed.   Fexofenadine HCl (ALLEGRA PO) Take 1 tablet by mouth every morning.    folic acid (FOLVITE) 767 MCG tablet Take 400 mcg by mouth 2 (two) times  daily.   furosemide (LASIX) 40 MG tablet TAKE 1 TABLET BY MOUTH TWICE DAILY AT 8 AM AND AT 2 PM   metFORMIN (GLUCOPHAGE) 500 MG tablet Take 1 tablet (500 mg total) by mouth daily with breakfast.   Multiple Vitamin (MULTIVITAMIN) capsule Take 1 capsule by mouth daily.   Multiple Vitamins-Minerals (EMERGEN-C IMMUNE PO) Take 1 tablet by mouth daily.    Omega-3 Fatty Acids (OMEGA 3 500) 500 MG CAPS Take 500 capsules by mouth daily.   potassium chloride SA (KLOR-CON) 20 MEQ tablet Take 1 tablet (20 mEq total) by mouth 2 (two) times daily.   Psyllium (METAMUCIL FIBER PO) Take 1 Dose by mouth daily.     tamsulosin (FLOMAX) 0.4 MG CAPS capsule TAKE 1 CAPSULE BY MOUTH EVERY DAY   fluorouracil (EFUDEX) 5 % cream Apply topically. (Patient not taking: No sig reported)   No current facility-administered medications on file prior to visit.     Allergies:   Ace inhibitors, Codeine, and Spironolactone   Social History   Tobacco Use   Smoking status: Former    Packs/day: 1.50    Years: 3.00    Pack years: 4.50    Types: Cigarettes, Pipe    Quit date: 03/12/1973    Years since quitting: 47.6   Smokeless tobacco: Never   Tobacco comments:    quit appox 36 years ago  Vaping Use   Vaping Use: Never used  Substance Use Topics   Alcohol use: Yes    Comment: occasionally, 1-7 drinks per week   Drug use: No    Family History: family history includes Cancer in his brother; Coronary artery disease in his father and mother; Diabetes in his brother and sister; Heart attack in his father and mother; Hypertension in his father, mother, and sister. There is no history of Colon cancer or Colonic polyp.  ROS:   Please see the history of present illness.  Additional pertinent ROS: Constitutional: Negative for chills, fever, night sweats, unintentional weight loss  HENT: Negative for ear pain and hearing loss.   Eyes: Negative for loss of vision and eye pain.  Respiratory: Negative for cough, sputum, wheezing.   Cardiovascular: See HPI. Gastrointestinal: Negative for abdominal pain, melena, and hematochezia.  Genitourinary: Negative for dysuria and hematuria.  Musculoskeletal: Positive for bilateral ankle edema. Negative for falls and myalgias.  Skin: Negative for itching and rash.  Neurological: Negative for focal weakness, focal sensory changes and loss of consciousness.  Endo/Heme/Allergies: Does bruise/bleed easily.     EKGs/Labs/Other Studies Reviewed:    The following studies were reviewed today:  Echo 09/21/2020:  1. Left ventricular ejection fraction, by estimation, is 60 to 65%. The   left ventricle has normal function. The left ventricle has no regional  wall motion abnormalities. There is mild concentric left ventricular  hypertrophy. Left ventricular diastolic  parameters are consistent with Grade I diastolic dysfunction (impaired  relaxation).   2. Right ventricular systolic function is normal. The right ventricular  size is normal. Tricuspid regurgitation signal is inadequate for assessing  PA pressure.   3. Left atrial size was mildly dilated.   4. The mitral valve is normal in structure. No evidence of mitral valve  regurgitation. No evidence of mitral stenosis.   5. The aortic valve is calcified. There is moderate calcification of the  aortic valve. There is moderate thickening of the aortic valve. Aortic  valve regurgitation is not visualized. Moderate aortic valve stenosis.  Aortic valve area, by VTI measures  1.16 cm. Aortic valve mean gradient measures 20.0 mmHg. Aortic valve Vmax  measures 3.18 m/s.   6. Aortic dilatation noted. There is mild dilatation of the aortic root,  measuring 43 mm. There is mild dilatation of the ascending aorta,  measuring 40 mm.   7. The inferior vena cava is dilated in size with <50% respiratory  variability, suggesting right atrial pressure of 15 mmHg.   Comparison(s): Compared to prior TTE in 09/2019, there is stable moderate  AS (previous mean gradient 23mHg; now 233mg). The ascending aorta  remains stable in size at 4037m  EKG:  EKG is personally reviewed.   10/19/2020: sinus bradycardia at 55 bpm with 1st degree AV block  Recent Labs: 09/21/2020: ALT 18; BUN 17; Creatinine 0.91; Hemoglobin 12.0; Platelet Count 180; Potassium 3.9; Sodium 137  Recent Lipid Panel    Component Value Date/Time   CHOL 136 01/11/2020 0813   TRIG 112 01/11/2020 0813   HDL 45 01/11/2020 0813   CHOLHDL 3.0 01/11/2020 0813   CHOLHDL 4 01/14/2019 1325   VLDL 38.8 01/14/2019 1325   LDLCALC 71 01/11/2020 0813    Physical Exam:     VS:  BP 112/62   Pulse (!) 55   Ht 6' (1.829 m)   Wt 224 lb 12.8 oz (102 kg)   BMI 30.49 kg/m     Wt Readings from Last 3 Encounters:  10/19/20 224 lb 12.8 oz (102 kg)  09/21/20 230 lb 12.8 oz (104.7 kg)  07/13/20 228 lb 6.4 oz (103.6 kg)    GEN: Well nourished, well developed in no acute distress HEENT: Normal, moist mucous membranes NECK: No JVD CARDIAC: regular rhythm, normal S1 and S2, no rubs or gallops. 3/6 early systolic murmur. VASCULAR: Radial and DP pulses 2+ bilaterally. No carotid bruits RESPIRATORY:  Clear to auscultation without rales, wheezing or rhonchi  ABDOMEN: Soft, non-tender, non-distended MUSCULOSKELETAL:  Ambulates independently SKIN: Warm and dry, trace left LE edema NEUROLOGIC:  Alert and oriented x 3. No focal neuro deficits noted. PSYCHIATRIC:  Normal affect    ASSESSMENT:    1. Paroxysmal atrial fibrillation (HCC)   2. Moderate aortic stenosis   3. Essential hypertension   4. Mild dilation of ascending aorta (HCC)   5. Counseling on health promotion and disease prevention   6. Cardiac risk counseling   7. OSA on CPAP    PLAN:    Paroxysmal atrial fibrillation: -CHA2DS2/VAS Stroke Risk Points=4  -has never been on anything other than coumadin. Reports this is not due to his cold agglutinin disease but rather that there was concern for lack of reversal agents when DOACs first introduced -discussed DOACs at length today, including risks/benefits and data -after our visit, INR at PCP was 4.1. Reviewed with our pharmacy team. Stop coumadin, start rivaroxaban with dinner on 8/13 -counseled on monitoring for bleeding -continue CPAP for OSA -see below for plan for medication changes (metoprolol, diltiazem)  Moderate aortic stenosis Mild dilation of ascending aorta -continue atorvastatin  Hypertension: -at goal of <130/80 -he reports that he has been on 0.3 mg clonidine BID for years. His blood pressure is excellently controlled. He would like  to simplify medications -after extensive discussion, we will taper off clonidine. Will stop diltiazem and metoprolol, change to carvedilol. Instructed to monitor BP and heart rate -will follow up closely in 6 week to monitor BP/HR -reports he cannot take ACEi/MRA due to hyperkalemia, but he is currently on a potassium supplement with his furosemide. Could reassess cautiously  in the future if needed -has never been on amlodipine that he is aware of  Cardiac risk counseling and prevention recommendations: -recommend heart healthy/Mediterranean diet, with whole grains, fruits, vegetable, fish, lean meats, nuts, and olive oil. Limit salt. -recommend moderate walking, 3-5 times/week for 30-50 minutes each session. Aim for at least 150 minutes.week. Goal should be pace of 3 miles/hours, or walking 1.5 miles in 30 minutes -recommend avoidance of tobacco products. Avoid excess alcohol. -ASCVD risk score: The 10-year ASCVD risk score [name]., et al., 2013) is: 32.4%   Values used to calculate the score:     Age: 16 years     Sex: Male     Is Non-Hispanic African American: No     Diabetic: Yes     Tobacco smoker: No     Systolic Blood Pressure: 161 mmHg     Is BP treated: Yes     HDL Cholesterol: 45 mg/dL     Total Cholesterol: 136 mg/dL    Plan for follow up: 6 weeks or sooner as needed.  Total time of encounter: 55 minutes total time of encounter, including 41 minutes spent in face-to-face patient care. This time includes coordination of care and counseling regarding medication management options. Remainder of non-face-to-face time involved reviewing chart documents/testing relevant to the patient encounter and documentation in the medical record.  Buford Dresser, MD, PhD, [facility]    Medication Adjustments/Labs and Tests Ordered: Current medicines are reviewed at length with the patient today.  Concerns regarding medicines are outlined above.  Orders  Placed This Encounter  Procedures   EKG 12-Lead    Meds ordered this encounter  Medications   rivaroxaban (XARELTO) 20 MG TABS tablet    Sig: Take 1 tablet (20 mg total) by mouth daily with supper.    Dispense:  90 tablet    Refill:  3   carvedilol (COREG) 12.5 MG tablet    Sig: Take 1 tablet (12.5 mg total) by mouth 2 (two) times daily.    Dispense:  180 tablet    Refill:  3    Replaces metoprolol and diltiazem    Patient Instructions  Medication Instructions:  We are going to taper down on the clonidine, starting with the next round of med fills: -week one, take 0.3 mg clonidine in the morning and 0.15 mg (1/2 pill) in the evening -week two, take 1.5 mg clonidine in the morning and 0.15 mg in the evening -week three, take 1.5 mg clonidine in the morning and none in the evening -week four, stop taking clonidine completely  When you start the clonidine taper, stop diltiazem and metoprolol, start carvedilol 12.5 mg  twice a day.  If INR less than 3, ok to start Xarelto 20 mg daily tomorrow.   *If you need a refill on your cardiac medications before your next appointment, please call your pharmacy*   Lab Work: None ordered today   Testing/Procedures: None ordered today   Follow-Up: At [facility], you and your health needs are our priority.  As part of our continuing mission to provide you with exceptional heart care, we have created designated Provider Care Teams.  These Care Teams include your primary Cardiologist (physician) and Advanced Practice Providers (APPs -  Physician Assistants and Nurse Practitioners) who all work together to provide you with the care you need, when you need it.  We recommend signing up for the patient portal called "MyChart".  Sign up information is  provided on this After Visit Summary.  MyChart is used to connect with patients for Virtual Visits (Telemedicine).  Patients are able to view lab/test results, encounter notes, upcoming  appointments, etc.  Non-urgent messages can be sent to your provider as well.   To learn more about what you can do with MyChart, go to [email or web address].    Your next appointment:   6 week(s)  The format for your next appointment:   In Person  Provider:   Buford Dresser, MD     [facility] [place],acting as a scribe for Buford Dresser, MD.,have documented all relevant documentation on the behalf of Buford Dresser, MD,as directed by  Buford Dresser, MD while in the presence of Buford Dresser, MD.  I, Buford Dresser, MD, have reviewed all documentation for this visit. The documentation on 10/19/20 for the exam, diagnosis, procedures, and orders are all accurate and complete.   Signed, Buford Dresser, MD PhD 10/19/2020 8:24 PM    [place]

## 2020-10-19 ENCOUNTER — Encounter (HOSPITAL_BASED_OUTPATIENT_CLINIC_OR_DEPARTMENT_OTHER): Payer: Self-pay

## 2020-10-19 ENCOUNTER — Encounter (HOSPITAL_BASED_OUTPATIENT_CLINIC_OR_DEPARTMENT_OTHER): Payer: Self-pay | Admitting: Cardiology

## 2020-10-19 ENCOUNTER — Other Ambulatory Visit: Payer: Self-pay

## 2020-10-19 ENCOUNTER — Ambulatory Visit (INDEPENDENT_AMBULATORY_CARE_PROVIDER_SITE_OTHER): Payer: Medicare Other | Admitting: Cardiology

## 2020-10-19 ENCOUNTER — Ambulatory Visit (INDEPENDENT_AMBULATORY_CARE_PROVIDER_SITE_OTHER): Payer: Medicare Other

## 2020-10-19 VITALS — BP 112/62 | HR 55 | Ht 72.0 in | Wt 224.8 lb

## 2020-10-19 DIAGNOSIS — Z9989 Dependence on other enabling machines and devices: Secondary | ICD-10-CM | POA: Diagnosis not present

## 2020-10-19 DIAGNOSIS — G4733 Obstructive sleep apnea (adult) (pediatric): Secondary | ICD-10-CM | POA: Diagnosis not present

## 2020-10-19 DIAGNOSIS — I350 Nonrheumatic aortic (valve) stenosis: Secondary | ICD-10-CM

## 2020-10-19 DIAGNOSIS — I77810 Thoracic aortic ectasia: Secondary | ICD-10-CM

## 2020-10-19 DIAGNOSIS — I10 Essential (primary) hypertension: Secondary | ICD-10-CM

## 2020-10-19 DIAGNOSIS — Z7189 Other specified counseling: Secondary | ICD-10-CM | POA: Diagnosis not present

## 2020-10-19 DIAGNOSIS — I480 Paroxysmal atrial fibrillation: Secondary | ICD-10-CM

## 2020-10-19 DIAGNOSIS — Z7901 Long term (current) use of anticoagulants: Secondary | ICD-10-CM | POA: Diagnosis not present

## 2020-10-19 LAB — POCT INR: INR: 4.1 — AB (ref 2.0–3.0)

## 2020-10-19 MED ORDER — CARVEDILOL 12.5 MG PO TABS: 12.5000 mg | ORAL_TABLET | Freq: Two times a day (BID) | ORAL | 3 refills | Status: DC

## 2020-10-19 MED ORDER — RIVAROXABAN 20 MG PO TABS: 20.0000 mg | ORAL_TABLET | Freq: Every day | ORAL | 3 refills | Status: DC

## 2020-10-19 NOTE — Progress Notes (Signed)
I have reviewed and agree with note, evaluation, plan.   Tryniti Laatsch, MD  

## 2020-10-19 NOTE — Patient Instructions (Addendum)
Pre visit review using our clinic review tool, if applicable. No additional management support is needed unless otherwise documented below in the visit note.  Hold dose today and hold dose tomorrow and then change weekly dose to take 2.1m daily except take 542mon Wednesdays. Recheck in 2 wks.  Add 2 to 3 servings of dark green leafy greens weekly.

## 2020-10-19 NOTE — Patient Instructions (Signed)
Medication Instructions:  We are going to taper down on the clonidine, starting with the next round of med fills: -week one, take 0.3 mg clonidine in the morning and 0.15 mg (1/2 pill) in the evening -week two, take 1.5 mg clonidine in the morning and 0.15 mg in the evening -week three, take 1.5 mg clonidine in the morning and none in the evening -week four, stop taking clonidine completely  When you start the clonidine taper, stop diltiazem and metoprolol, start carvedilol 12.5 mg  twice a day.  If INR less than 3, ok to start Xarelto 20 mg daily tomorrow.   *If you need a refill on your cardiac medications before your next appointment, please call your pharmacy*   Lab Work: None ordered today   Testing/Procedures: None ordered today   Follow-Up: At [facility], you and your health needs are our priority.  As part of our continuing mission to provide you with exceptional heart care, we have created designated Provider Care Teams.  These Care Teams include your primary Cardiologist (physician) and Advanced Practice Providers (APPs -  Physician Assistants and Nurse Practitioners) who all work together to provide you with the care you need, when you need it.  We recommend signing up for the patient portal called "MyChart".  Sign up information is provided on this After Visit Summary.  MyChart is used to connect with patients for Virtual Visits (Telemedicine).  Patients are able to view lab/test results, encounter notes, upcoming appointments, etc.  Non-urgent messages can be sent to your provider as well.   To learn more about what you can do with MyChart, go to [email or web address].    Your next appointment:   6 week(s)  The format for your next appointment:   In Person  Provider:   Buford Dresser, MD

## 2020-10-21 ENCOUNTER — Ambulatory Visit: Payer: Self-pay

## 2020-10-21 NOTE — Telephone Encounter (Signed)
Pt revealed at coumadin clinic apt that his cardiologist wanted to transition him to [place]. This msg reports pt is transitioning to xarelto and will be d/c warfarin. Ending coumadin episode for this pt

## 2020-11-01 ENCOUNTER — Encounter: Payer: Self-pay | Admitting: Family Medicine

## 2020-11-01 ENCOUNTER — Telehealth (INDEPENDENT_AMBULATORY_CARE_PROVIDER_SITE_OTHER): Payer: Medicare Other | Admitting: Family Medicine

## 2020-11-01 VITALS — Ht 72.0 in | Wt 223.0 lb

## 2020-11-01 DIAGNOSIS — I480 Paroxysmal atrial fibrillation: Secondary | ICD-10-CM

## 2020-11-01 DIAGNOSIS — M7021 Olecranon bursitis, right elbow: Secondary | ICD-10-CM

## 2020-11-01 DIAGNOSIS — I10 Essential (primary) hypertension: Secondary | ICD-10-CM

## 2020-11-01 NOTE — Progress Notes (Signed)
   Steven Shepard is a 75 y.o. male who presents today for a virtual office visit.  Assessment/Plan:  New/Acute Problems: Olecranon bursitis No red flags.  Reassured patient.  We will start with conservative management including ice and compression.  He will avoid anti-inflammatories for the time being.  He will let me know if not improving and we can consider referral to sports med or physical therapy.  Chronic Problems Addressed Today: Paroxysmal atrial fibrillation ([place]) Recently started on Xarelto.  Tolerating well.  Essential hypertension Managed by cardiology.  On Coreg 12.5 mg twice daily.  He will come back soon for CPE and we will recheck in office at that time.     Subjective:  HPI:  Patient here with cyst on right elbow.  Is been present for a few weeks.  No pain.  No trauma.  No specific treatments tried.  Cyst seems to be enlarging in size.  See A/p for status of chronic conditions.        Objective/Observations  Physical Exam: Gen: NAD, resting comfortably Pulm: Normal work of breathing Neuro: Grossly normal, moves all extremities MSK: Olecranon bursitis on right elbow. Psych: Normal affect and thought content  Virtual Visit via Video   I connected with Steven Shepard on 11/01/20 at  4:00 PM EDT by a video enabled telemedicine application and verified that I am speaking with the correct person using two identifiers. The limitations of evaluation and management by telemedicine and the availability of in person appointments were discussed. The patient expressed understanding and agreed to proceed.   Patient location: Home Provider location: [place] participating in the virtual visit: Myself and Patient     Algis Greenhouse. Jerline Pain, MD 11/01/2020 4:05 PM

## 2020-11-01 NOTE — Assessment & Plan Note (Signed)
Recently started on Xarelto.  Tolerating well.

## 2020-11-01 NOTE — Assessment & Plan Note (Signed)
Managed by cardiology.  On Coreg 12.5 mg twice daily.  He will come back soon for CPE and we will recheck in office at that time.

## 2020-11-02 ENCOUNTER — Ambulatory Visit: Payer: Medicare Other

## 2020-11-16 ENCOUNTER — Other Ambulatory Visit: Payer: Self-pay | Admitting: Cardiology

## 2020-11-16 DIAGNOSIS — E7849 Other hyperlipidemia: Secondary | ICD-10-CM

## 2020-11-16 DIAGNOSIS — I110 Hypertensive heart disease with heart failure: Secondary | ICD-10-CM

## 2020-11-17 DIAGNOSIS — H2513 Age-related nuclear cataract, bilateral: Secondary | ICD-10-CM | POA: Diagnosis not present

## 2020-11-17 DIAGNOSIS — E119 Type 2 diabetes mellitus without complications: Secondary | ICD-10-CM | POA: Diagnosis not present

## 2020-11-17 DIAGNOSIS — H524 Presbyopia: Secondary | ICD-10-CM | POA: Diagnosis not present

## 2020-11-17 DIAGNOSIS — H5203 Hypermetropia, bilateral: Secondary | ICD-10-CM | POA: Diagnosis not present

## 2020-11-17 LAB — HM DIABETES EYE EXAM

## 2020-11-28 NOTE — Progress Notes (Signed)
Cardiology Office Note:    Date:  12/01/2020   ID:  Steven Shepard, DOB June 20, 1945, MRN [id number]  PCP:  Vivi Barrack, MD  Cardiologist:  Buford Dresser, MD  Referring MD: Orma Flaming, MD   CC: follow-up   History of Present Illness:    Steven Shepard is a 75 y.o. male with a hx of paroxysmal atrial fibrillation, hemolytic anemia, aortic stenosis, cold agglutinin disease, COPD, hyperlipidemia, hypertension, OSA on CPAP, and pre-diabetes, who is seen for follow-up. He was previously a patient of Dr. Meda Coffee and Ermalinda Barrios.  Family history: His father (78 yo) and mother (45 yo) died of heart attacks. Prior cardiac testing and/or incidental findings on other testing (ie coronary calcium): Heart Cath (20-30 yrs ago), no stents Exercise level: Regularly walks his dog. Reports mild chest pain and shortness of breath due to deconditioning.  Today: Overall, he is feeling fine. He has noticed improvement in his ankle edema. Lately he has not been monitoring blood pressure at home. He transitioned off the clonidine and onto current medications without issue.  Sometimes he develops chest pain that he describes as "tightness that is not strong by any means." mostly on his right side. Occurs randomly, such as when he first gets up in the morning. The pain is mostly right-sided, but intermittently left-sided.  "Once or twice" he has noticed racing heart beats, but this has a very short duration and is usually resolved with deep inspiration.  He endorses occasional ecchymosis but denies any hematuria, hematochezia, or other random bleeding.  Also, he has bursitis in his right elbow, and was instructed to wear compression.  He will follow up with his PCP in November 2022.  He denies any shortness of breath. No lightheadedness, headaches, syncope, orthopnea, or PND. Also has no exertional symptoms.   Past Medical History:  Diagnosis Date   Anemia, hemolytic ([place])  01/22/2011   Aortic stenosis    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[place]) 01/23/2011   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[place]) 03/23/2020   COPD (chronic obstructive pulmonary disease) (HCC)    DOE (dyspnea on exertion)    Elevated bilirubin    Fatty liver    History of neck surgery    Hyperlipidemia    Hypertension    Mild dilation of ascending aorta (HCC)    OSA on CPAP    PAF (paroxysmal atrial fibrillation) ([place])    Pre-diabetes    Umbilical hernia     Past Surgical History:  Procedure Laterality Date   ADENOIDECTOMY     HIATAL HERNIA REPAIR  2011   NECK SURGERY  2005   SKIN SURGERY     skin cancer   TONSILLECTOMY     TOTAL HIP ARTHROPLASTY Right 09/18/2010   Dr. Maureen Ralphs    Current Medications: Current Outpatient Medications on File Prior to Visit  Medication Sig   acetaminophen (TYLENOL) 650 MG CR tablet Take 650 mg by mouth 2 (two) times daily as needed for pain.   atorvastatin (LIPITOR) 20 MG tablet TAKE 1 TABLET(20 MG) BY MOUTH DAILY   carvedilol (COREG) 12.5 MG tablet Take 1 tablet (12.5 mg total) by mouth 2 (two) times daily.   Fexofenadine HCl (ALLEGRA PO) Take 1 tablet by mouth every morning.    fluorouracil (EFUDEX) 5 % cream Apply topically.   folic acid (FOLVITE) 169 MCG tablet Take 400 mcg by mouth 2 (two) times daily.   furosemide (LASIX) 40 MG tablet TAKE 1 TABLET BY MOUTH TWICE DAILY AT 8 AM AND AT 2  PM   metFORMIN (GLUCOPHAGE) 500 MG tablet Take 1 tablet (500 mg total) by mouth daily with breakfast.   Multiple Vitamin (MULTIVITAMIN) capsule Take 1 capsule by mouth daily.   Multiple Vitamins-Minerals (EMERGEN-C IMMUNE PO) Take 1 tablet by mouth daily.    Omega-3 Fatty Acids (OMEGA 3 500) 500 MG CAPS Take 500 capsules by mouth daily.   potassium chloride SA (KLOR-CON) 20 MEQ tablet Take 1 tablet (20 mEq total) by mouth 2 (two) times daily.   Psyllium (METAMUCIL FIBER PO) Take 1 Dose by mouth daily.    rivaroxaban (XARELTO) 20 MG TABS tablet Take 1 tablet (20 mg total) by mouth  daily with supper.   tamsulosin (FLOMAX) 0.4 MG CAPS capsule TAKE 1 CAPSULE BY MOUTH EVERY DAY   No current facility-administered medications on file prior to visit.     Allergies:   Ace inhibitors, Codeine, and Spironolactone   Social History   Tobacco Use   Smoking status: Former    Packs/day: 1.50    Years: 3.00    Pack years: 4.50    Types: Cigarettes, Pipe    Quit date: 03/12/1973    Years since quitting: 47.7   Smokeless tobacco: Never   Tobacco comments:    quit appox 36 years ago  Vaping Use   Vaping Use: Never used  Substance Use Topics   Alcohol use: Yes    Comment: occasionally, 1-7 drinks per week   Drug use: No    Family History: family history includes Cancer in his brother; Coronary artery disease in his father and mother; Diabetes in his brother and sister; Heart attack in his father and mother; Hypertension in his father, mother, and sister. There is no history of Colon cancer or Colonic polyp.  ROS:   Please see the history of present illness. (+) Chest pain (+) Palpitations (+) Ecchymosis (+) Bursitis, right elbow All other systems are reviewed and negative.    EKGs/Labs/Other Studies Reviewed:    The following studies were reviewed today:  Echo 09/21/2020:  1. Left ventricular ejection fraction, by estimation, is 60 to 65%. The  left ventricle has normal function. The left ventricle has no regional  wall motion abnormalities. There is mild concentric left ventricular  hypertrophy. Left ventricular diastolic  parameters are consistent with Grade I diastolic dysfunction (impaired  relaxation).   2. Right ventricular systolic function is normal. The right ventricular  size is normal. Tricuspid regurgitation signal is inadequate for assessing  PA pressure.   3. Left atrial size was mildly dilated.   4. The mitral valve is normal in structure. No evidence of mitral valve  regurgitation. No evidence of mitral stenosis.   5. The aortic valve is  calcified. There is moderate calcification of the  aortic valve. There is moderate thickening of the aortic valve. Aortic  valve regurgitation is not visualized. Moderate aortic valve stenosis.  Aortic valve area, by VTI measures  1.16 cm. Aortic valve mean gradient measures 20.0 mmHg. Aortic valve Vmax  measures 3.18 m/s.   6. Aortic dilatation noted. There is mild dilatation of the aortic root,  measuring 43 mm. There is mild dilatation of the ascending aorta,  measuring 40 mm.   7. The inferior vena cava is dilated in size with <50% respiratory  variability, suggesting right atrial pressure of 15 mmHg.   Comparison(s): Compared to prior TTE in 09/2019, there is stable moderate  AS (previous mean gradient 66mHg; now 240mg). The ascending aorta  remains stable in size  at 84m.   EKG:  EKG is personally reviewed.   12/01/2020: not ordered 10/19/2020: sinus bradycardia at 55 bpm with 1st degree AV block  Recent Labs: 09/21/2020: ALT 18; BUN 17; Creatinine 0.91; Hemoglobin 12.0; Platelet Count 180; Potassium 3.9; Sodium 137   Recent Lipid Panel    Component Value Date/Time   CHOL 136 01/11/2020 0813   TRIG 112 01/11/2020 0813   HDL 45 01/11/2020 0813   CHOLHDL 3.0 01/11/2020 0813   CHOLHDL 4 01/14/2019 1325   VLDL 38.8 01/14/2019 1325   LDLCALC 71 01/11/2020 0813    Physical Exam:    VS:  BP (!) 158/84 (BP Location: Left Arm, Patient Position: Sitting, Cuff Size: Normal)   Pulse 79   Ht 6' 1"  (1.854 m)   Wt 221 lb (100.2 kg)   SpO2 93%   BMI 29.16 kg/m     Wt Readings from Last 3 Encounters:  12/01/20 221 lb (100.2 kg)  11/01/20 223 lb (101.2 kg)  10/19/20 224 lb 12.8 oz (102 kg)    GEN: Well nourished, well developed in no acute distress HEENT: Normal, moist mucous membranes NECK: No JVD CARDIAC: regular rhythm, normal S1 and S2, no rubs or gallops. 3/6 early systolic murmur. VASCULAR: Radial and DP pulses 2+ bilaterally. No carotid bruits RESPIRATORY:  Clear to  auscultation without rales, wheezing or rhonchi  ABDOMEN: Soft, non-tender, non-distended MUSCULOSKELETAL:  Ambulates independently SKIN: Warm and dry, no LE edema NEUROLOGIC:  Alert and oriented x 3. No focal neuro deficits noted. PSYCHIATRIC:  Normal affect    ASSESSMENT:    1. Paroxysmal atrial fibrillation (HCC)   2. Moderate aortic stenosis   3. Essential hypertension   4. Mild dilation of ascending aorta (HCC)   5. Long term current use of anticoagulant   6. Secondary hypercoagulable state ([place]   7. At increased risk for cardiovascular disease     PLAN:    Paroxysmal atrial fibrillation: -CHA2DS2/VAS Stroke Risk Points=4  -did well with change from coumadin to rivaroxaban. Discussed need to take rivaroxaban with food for best absorption -rare, fleeting palpitations -counseled on monitoring for bleeding -continue CPAP for OSA  Moderate aortic stenosis Mild dilation of ascending aorta Elevated ASCVD risk -continue atorvastatin -last echo 09/2020, repeat every 2-3 years or if symptoms change  Hypertension: -elevated initially in office today to 172/88, decreased on recheck to 158/84. He will check numbers at home and send a mychart message with readings to me.  -based on readings, can either increase carvedilol depending on heart rate, or would add medication (below) -reports he cannot take ACEi/MRA due to hyperkalemia, but he is currently on a potassium supplement with his furosemide. Could reassess cautiously in the future if needed -has never been on amlodipine that he is aware of  Cardiac risk counseling and prevention recommendations: -recommend heart healthy/Mediterranean diet, with whole grains, fruits, vegetable, fish, lean meats, nuts, and olive oil. Limit salt. -recommend moderate walking, 3-5 times/week for 30-50 minutes each session. Aim for at least 150 minutes.week. Goal should be pace of 3 miles/hours, or walking 1.5 miles in 30 minutes -recommend avoidance  of tobacco products. Avoid excess alcohol. -ASCVD risk score: The 10-year ASCVD risk score (Arnett DK, et al., 2019) is: 51.6%   Values used to calculate the score:     Age: [age]years     Sex: Male     Is Non-Hispanic African American: No     Diabetic: Yes     Tobacco smoker: No  Systolic Blood Pressure: 751 mmHg     Is BP treated: Yes     HDL Cholesterol: 45 mg/dL     Total Cholesterol: 136 mg/dL    Plan for follow up: 4 months or sooner as needed.  Buford Dresser, MD, PhD, [facility]    Medication Adjustments/Labs and Tests Ordered: Current medicines are reviewed at length with the patient today.  Concerns regarding medicines are outlined above.   No orders of the defined types were placed in this encounter.  No orders of the defined types were placed in this encounter.  Patient Instructions  Medication Instructions:  Your Physician recommend you continue on your current medication as directed.    *If you need a refill on your cardiac medications before your next appointment, please call your pharmacy*   Lab Work: None ordered today   Testing/Procedures: None ordered today   Follow-Up: At [facility], you and your health needs are our priority.  As part of our continuing mission to provide you with exceptional heart care, we have created designated Provider Care Teams.  These Care Teams include your primary Cardiologist (physician) and Advanced Practice Providers (APPs -  Physician Assistants and Nurse Practitioners) who all work together to provide you with the care you need, when you need it.  We recommend signing up for the patient portal called "MyChart".  Sign up information is provided on this After Visit Summary.  MyChart is used to connect with patients for Virtual Visits (Telemedicine).  Patients are able to view lab/test results, encounter notes, upcoming appointments, etc.  Non-urgent messages can be sent to your provider as  well.   To learn more about what you can do with MyChart, go to [email or web address].    Your next appointment:   4 month(s)  The format for your next appointment:   In Person  Provider:   Buford Dresser, MD   Other Instructions how to check blood pressure:  -sit comfortably in a chair, feet uncrossed and flat on floor, for 5-10 minutes  -arm ideally should rest at the level of the heart. However, arm should be relaxed and not tense (for example, do not hold the arm up unsupported)  -avoid exercise, caffeine, and tobacco for at least 30 minutes prior to BP reading  -don't take BP cuff reading over clothes (always place on skin directly)  -I prefer to know how well the medication is working, so I would like you to take your readings 1-2 hours after taking your blood pressure medication if possible     I,Mathew Stumpf,acting as a scribe for [employer], MD.,have documented all relevant documentation on the behalf of Buford Dresser, MD,as directed by  Buford Dresser, MD while in the presence of Buford Dresser, MD.  I, Buford Dresser, MD, have reviewed all documentation for this visit. The documentation on 12/01/20 for the exam, diagnosis, procedures, and orders are all accurate and complete.   Signed, Buford Dresser, MD PhD 12/01/2020 8:21 AM    [place]

## 2020-12-01 ENCOUNTER — Other Ambulatory Visit: Payer: Self-pay

## 2020-12-01 ENCOUNTER — Ambulatory Visit (INDEPENDENT_AMBULATORY_CARE_PROVIDER_SITE_OTHER): Payer: Medicare Other | Admitting: Cardiology

## 2020-12-01 ENCOUNTER — Encounter (HOSPITAL_BASED_OUTPATIENT_CLINIC_OR_DEPARTMENT_OTHER): Payer: Self-pay | Admitting: Cardiology

## 2020-12-01 ENCOUNTER — Encounter (HOSPITAL_BASED_OUTPATIENT_CLINIC_OR_DEPARTMENT_OTHER): Payer: Self-pay

## 2020-12-01 VITALS — BP 158/84 | HR 79 | Ht 73.0 in | Wt 221.0 lb

## 2020-12-01 DIAGNOSIS — I480 Paroxysmal atrial fibrillation: Secondary | ICD-10-CM | POA: Diagnosis not present

## 2020-12-01 DIAGNOSIS — Z9189 Other specified personal risk factors, not elsewhere classified: Secondary | ICD-10-CM | POA: Diagnosis not present

## 2020-12-01 DIAGNOSIS — Z85828 Personal history of other malignant neoplasm of skin: Secondary | ICD-10-CM | POA: Diagnosis not present

## 2020-12-01 DIAGNOSIS — I77810 Thoracic aortic ectasia: Secondary | ICD-10-CM

## 2020-12-01 DIAGNOSIS — I350 Nonrheumatic aortic (valve) stenosis: Secondary | ICD-10-CM | POA: Diagnosis not present

## 2020-12-01 DIAGNOSIS — L570 Actinic keratosis: Secondary | ICD-10-CM | POA: Diagnosis not present

## 2020-12-01 DIAGNOSIS — Z7901 Long term (current) use of anticoagulants: Secondary | ICD-10-CM

## 2020-12-01 DIAGNOSIS — D6869 Other thrombophilia: Secondary | ICD-10-CM | POA: Diagnosis not present

## 2020-12-01 DIAGNOSIS — I10 Essential (primary) hypertension: Secondary | ICD-10-CM | POA: Diagnosis not present

## 2020-12-01 NOTE — Patient Instructions (Signed)
Medication Instructions:  Your Physician recommend you continue on your current medication as directed.    *If you need a refill on your cardiac medications before your next appointment, please call your pharmacy*   Lab Work: None ordered today   Testing/Procedures: None ordered today   Follow-Up: At [facility], you and your health needs are our priority.  As part of our continuing mission to provide you with exceptional heart care, we have created designated Provider Care Teams.  These Care Teams include your primary Cardiologist (physician) and Advanced Practice Providers (APPs -  Physician Assistants and Nurse Practitioners) who all work together to provide you with the care you need, when you need it.  We recommend signing up for the patient portal called "MyChart".  Sign up information is provided on this After Visit Summary.  MyChart is used to connect with patients for Virtual Visits (Telemedicine).  Patients are able to view lab/test results, encounter notes, upcoming appointments, etc.  Non-urgent messages can be sent to your provider as well.   To learn more about what you can do with MyChart, go to [email or web address].    Your next appointment:   4 month(s)  The format for your next appointment:   In Person  Provider:   Buford Dresser, MD   Other Instructions how to check blood pressure:  -sit comfortably in a chair, feet uncrossed and flat on floor, for 5-10 minutes  -arm ideally should rest at the level of the heart. However, arm should be relaxed and not tense (for example, do not hold the arm up unsupported)  -avoid exercise, caffeine, and tobacco for at least 30 minutes prior to BP reading  -don't take BP cuff reading over clothes (always place on skin directly)  -I prefer to know how well the medication is working, so I would like you to take your readings 1-2 hours after taking your blood pressure medication if possible

## 2020-12-02 ENCOUNTER — Encounter: Payer: Self-pay | Admitting: Family Medicine

## 2020-12-02 ENCOUNTER — Encounter (HOSPITAL_BASED_OUTPATIENT_CLINIC_OR_DEPARTMENT_OTHER): Payer: Self-pay

## 2020-12-02 MED ORDER — AMLODIPINE BESYLATE 5 MG PO TABS: 5.0000 mg | ORAL_TABLET | Freq: Every day | ORAL | 11 refills | Status: DC

## 2020-12-09 MED ORDER — AMLODIPINE BESYLATE 10 MG PO TABS: 10.0000 mg | ORAL_TABLET | Freq: Every day | ORAL | 1 refills | Status: DC

## 2020-12-15 DIAGNOSIS — M25552 Pain in left hip: Secondary | ICD-10-CM | POA: Diagnosis not present

## 2021-01-01 ENCOUNTER — Other Ambulatory Visit: Payer: Self-pay | Admitting: Family Medicine

## 2021-01-01 DIAGNOSIS — I480 Paroxysmal atrial fibrillation: Secondary | ICD-10-CM

## 2021-01-01 DIAGNOSIS — I5031 Acute diastolic (congestive) heart failure: Secondary | ICD-10-CM

## 2021-01-11 ENCOUNTER — Telehealth: Payer: Self-pay | Admitting: *Deleted

## 2021-01-11 ENCOUNTER — Other Ambulatory Visit: Payer: Self-pay | Admitting: Family Medicine

## 2021-01-11 DIAGNOSIS — I5031 Acute diastolic (congestive) heart failure: Secondary | ICD-10-CM

## 2021-01-11 DIAGNOSIS — I480 Paroxysmal atrial fibrillation: Secondary | ICD-10-CM

## 2021-01-11 NOTE — Telephone Encounter (Signed)
Called and spoke with patient, advised to bring SD card with him to his appointment in the morning.  He verbalized understanding.  Nothing further needed.

## 2021-01-12 ENCOUNTER — Other Ambulatory Visit: Payer: Self-pay

## 2021-01-12 ENCOUNTER — Encounter: Payer: Self-pay | Admitting: Adult Health

## 2021-01-12 ENCOUNTER — Ambulatory Visit (INDEPENDENT_AMBULATORY_CARE_PROVIDER_SITE_OTHER): Payer: Medicare Other | Admitting: Adult Health

## 2021-01-12 VITALS — BP 140/60 | HR 75 | Temp 98.2°F | Ht 73.0 in | Wt 226.4 lb

## 2021-01-12 DIAGNOSIS — Z23 Encounter for immunization: Secondary | ICD-10-CM | POA: Diagnosis not present

## 2021-01-12 DIAGNOSIS — Z9989 Dependence on other enabling machines and devices: Secondary | ICD-10-CM | POA: Diagnosis not present

## 2021-01-12 DIAGNOSIS — G4733 Obstructive sleep apnea (adult) (pediatric): Secondary | ICD-10-CM | POA: Diagnosis not present

## 2021-01-12 NOTE — Patient Instructions (Signed)
Continue on CPAP At bedtime   Keep up good work  Work on [employer] .  Remain active  Flu shot today.  Follow up with Dr. Elsworth Soho  In 1 year and As needed

## 2021-01-12 NOTE — Progress Notes (Signed)
@Patient  ID: Steven Shepard, male    DOB: 1946/01/31, 75 y.o.   MRN: [id number]  Chief Complaint  Patient presents with   Follow-up    Referring provider: Orma Flaming, MD  HPI: 75 year old male former smoker followed for obstructive sleep apnea and very mild COPD  TEST/EVENTS :  PSG 09/2010  severe obstructive sleep apnea with AHI 86/h corrected by CPAP 9 cm  Maintained on CPAP 9cm, small nasal comfort gel mask,    2010 Spirometry FEV1 83%, ratio 69  01/12/2021 Follow up: OSA  Patient returns for 1 year follow-up.  Patient is a retired [employer].  Travels a lot back-and-forth to [place].  Has 2 different CPAP machines and also a ResMed mini for travel. Patient says he is doing well on CPAP.  He wears a CPAP every night cannot sleep without it CPAP download shows excellent compliance with daily average usage at 7.5 hours.  Patient is on CPAP 9 cm H2O.  AHI 0.4. Remains active walks the dog.   Has mention of very mild COPD never on inhalers. No cough or dyspnea. Not on any inhalers   Allergies  Allergen Reactions   Ace Inhibitors Other (See Comments)    Other reaction(s): High K+    Codeine Other (See Comments)    Other reaction(s):Hallucinations   Spironolactone Other (See Comments)    Increases potassium    Immunization History  Administered Date(s) Administered   Fluad Quad(high Dose 65+) 12/17/2018, 01/12/2020, 01/12/2021   Influenza Split 12/11/2011, 12/10/2012   Influenza, High Dose Seasonal PF 01/09/2012, 01/09/2013, 01/11/2014, 12/14/2015, 01/01/2018   Influenza-Unspecified 01/09/2012, 01/09/2013, 12/10/2013, 01/11/2014, 12/14/2015, 11/25/2016   PFIZER(Purple Top)SARS-COV-2 Vaccination 03/31/2019, 04/21/2019, 12/18/2019   Pneumococcal Conjugate-13 11/15/2015   Pneumococcal Polysaccharide-23 09/09/2009, 06/07/2010, 01/11/2018   Tdap 11/20/2005, 01/11/2018   Zoster, Live 03/12/2010    Past Medical History:  Diagnosis Date   Anemia, hemolytic ([place])  01/22/2011   Aortic stenosis    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[place]) 01/23/2011   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[place]) 03/23/2020   COPD (chronic obstructive pulmonary disease) (HCC)    DOE (dyspnea on exertion)    Elevated bilirubin    Fatty liver    History of neck surgery    Hyperlipidemia    Hypertension    Mild dilation of ascending aorta (HCC)    OSA on CPAP    PAF (paroxysmal atrial fibrillation) (HCC)    Pre-diabetes    Umbilical hernia     Tobacco History: Social History   Tobacco Use  Smoking Status Former   Packs/day: 1.50   Years: 3.00   Pack years: 4.50   Types: Cigarettes, Pipe   Quit date: 03/12/1973   Years since quitting: 47.8  Smokeless Tobacco Never  Tobacco Comments   quit appox 36 years ago   Counseling given: Not Answered Tobacco comments: quit appox 36 years ago   Outpatient Medications Prior to Visit  Medication Sig Dispense Refill   acetaminophen (TYLENOL) 650 MG CR tablet Take 650 mg by mouth 2 (two) times daily as needed for pain.     amLODipine (NORVASC) 10 MG tablet Take 1 tablet (10 mg total) by mouth daily. 90 tablet 1   atorvastatin (LIPITOR) 20 MG tablet TAKE 1 TABLET(20 MG) BY MOUTH DAILY 90 tablet 3   carvedilol (COREG) 12.5 MG tablet Take 1 tablet (12.5 mg total) by mouth 2 (two) times daily. 180 tablet 3   Fexofenadine HCl (ALLEGRA PO) Take 1 tablet by mouth every morning.  fluorouracil (EFUDEX) 5 % cream Apply topically.     folic acid (FOLVITE) 374 MCG tablet Take 400 mcg by mouth 2 (two) times daily.     furosemide (LASIX) 40 MG tablet TAKE 1 TABLET BY MOUTH TWICE DAILY AT 8 AM AND AT 2 PM 180 tablet 1   metFORMIN (GLUCOPHAGE) 500 MG tablet Take 1 tablet (500 mg total) by mouth daily with breakfast. 90 tablet 3   Multiple Vitamin (MULTIVITAMIN) capsule Take 1 capsule by mouth daily.     Multiple Vitamins-Minerals (EMERGEN-C IMMUNE PO) Take 1 tablet by mouth daily.      Omega-3 Fatty Acids (OMEGA 3 500) 500 MG CAPS Take 500 capsules by mouth  daily.     potassium chloride SA (KLOR-CON) 20 MEQ tablet Take 1 tablet (20 mEq total) by mouth 2 (two) times daily. 180 tablet 3   Psyllium (METAMUCIL FIBER PO) Take 1 Dose by mouth daily.      rivaroxaban (XARELTO) 20 MG TABS tablet Take 1 tablet (20 mg total) by mouth daily with supper. 90 tablet 3   tamsulosin (FLOMAX) 0.4 MG CAPS capsule TAKE 1 CAPSULE BY MOUTH EVERY DAY 90 capsule 1   No facility-administered medications prior to visit.     Review of Systems:   Constitutional:   No  weight loss, night sweats,  Fevers, chills, fatigue, or  lassitude.  HEENT:   No headaches,  Difficulty swallowing,  Tooth/dental problems, or  Sore throat,                No sneezing, itching, ear ache, nasal congestion, post nasal drip,   CV:  No chest pain,  Orthopnea, PND, swelling in lower extremities, anasarca, dizziness, palpitations, syncope.   GI  No heartburn, indigestion, abdominal pain, nausea, vomiting, diarrhea, change in bowel habits, loss of appetite, bloody stools.   Resp: No shortness of breath with exertion or at rest.  No excess mucus, no productive cough,  No non-productive cough,  No coughing up of blood.  No change in color of mucus.  No wheezing.  No chest wall deformity  Skin: no rash or lesions.  GU: no dysuria, change in color of urine, no urgency or frequency.  No flank pain, no hematuria   MS:  No joint pain or swelling.  No decreased range of motion.  No back pain.    Physical Exam  BP 140/60 (BP Location: Left Arm, Patient Position: Sitting, Cuff Size: Normal)   Pulse 75   Temp 98.2 F (36.8 C) (Oral)   Ht 6' 1"  (1.854 m)   Wt 226 lb 6.4 oz (102.7 kg)   SpO2 100%   BMI 29.87 kg/m   GEN: A/Ox3; pleasant , NAD, well nourished    HEENT:  [place]/AT,  NOSE-clear, THROAT-clear, no lesions, no postnasal drip or exudate noted.   NECK:  Supple w/ fair ROM; no JVD; normal carotid impulses w/o bruits; no thyromegaly or nodules palpated; no lymphadenopathy.    RESP   Clear  P & A; w/o, wheezes/ rales/ or rhonchi. no accessory muscle use, no dullness to percussion  CARD:  RRR, no m/r/g, no peripheral edema, pulses intact, no cyanosis or clubbing.  GI:   Soft & nt; nml bowel sounds; no organomegaly or masses detected.   Musco: Warm bil, no deformities or joint swelling noted.   Neuro: alert, no focal deficits noted.    Skin: Warm, no lesions or rashes    Lab Results:     BNP  ProBNP No results found for: PROBNP  Imaging: No results found.    No flowsheet data found.  No results found for: NITRICOXIDE      Assessment & Plan:   OSA on CPAP Excellent control and compliance on nocturnal CPAP Patient education on OSA and CPAP care   Plan  Patient Instructions  Continue on CPAP At bedtime   Keep up good work  Work on [employer] .  Remain active  Flu shot today.  Follow up with Dr. Elsworth Soho  In 1 year and As needed          I spent   20 minutes dedicated to the care of this patient on the date of this encounter to include pre-visit review of records, face-to-face time with the patient discussing conditions above, post visit ordering of testing, clinical documentation with the electronic health record, making appropriate referrals as documented, and communicating necessary findings to members of the patients care team.   Rexene Edison, NP 01/12/2021

## 2021-01-12 NOTE — Assessment & Plan Note (Addendum)
Excellent control and compliance on nocturnal CPAP Patient education on OSA and CPAP care   Plan  Patient Instructions  Continue on CPAP At bedtime   Keep up good work  Work on [employer] .  Remain active  Flu shot today.  Follow up with Dr. Elsworth Soho  In 1 year and As needed

## 2021-01-16 ENCOUNTER — Telehealth: Payer: Self-pay

## 2021-01-16 NOTE — Telephone Encounter (Signed)
I am not familiar with this one so not able to give recs, I would imagine if its not cleaning the CPAP machine then it should be okay for the warrantee issue.  However I still feel that just traditional cleaning is more than enough

## 2021-01-16 NOTE — Telephone Encounter (Signed)
   [facility] Pre-operative Risk Assessment    Patient Name: Steven Shepard  DOB: 1945-08-16 MRN: [id number]  HEARTCARE STAFF:  - IMPORTANT!!!!!! Under Visit Info/Reason for Call, type in Other and utilize the format Clearance MM/DD/YY or Clearance TBD. Do not use dashes or single digits. - Please review there is not already an duplicate clearance open for this procedure. - If request is for dental extraction, please clarify the # of teeth to be extracted. - If the patient is currently at the dentist's office, call Pre-Op Callback Staff (MA/nurse) to input urgent request.  - If the patient is not currently in the dentist office, please route to the Pre-Op pool.  Request for surgical clearance:  What type of surgery is being performed? Left Total Hip Arthroplasty Anterior   When is this surgery scheduled? 03/15/2021  What type of clearance is required (medical clearance vs. Pharmacy clearance to hold med vs. Both)? Medical  Are there any medications that need to be held prior to surgery and how long? None   Practice name and name of physician performing surgery? [facility] Gaynelle Arabian MD   What is the office phone number? [place]   7.   What is the office fax number? [phone]  8.   Anesthesia type (None, local, MAC, general) ? Choice   Chaunice Obie L Verneta Hamidi 01/16/2021, 4:30 PM  _________________________________________________________________   (provider comments below)

## 2021-01-16 NOTE — Telephone Encounter (Signed)
TP please advise. Thanks  Tammy - You mentioned that SoClean is not good as it involves the machine and could break down items in the CPAP matching causing possible health problems.  I have a question regarding Respify cleaning system.  It uses Ozone also but it cleans in a separate bag with just the tubing, water container, and mask.  It claims it doesn't interfere with warranties as it does not attach to the CPAP machine. What are your thoughts?  The website is respify.com .  Let me know.  Thanks.  Have a great Thanksgiving!

## 2021-01-18 ENCOUNTER — Ambulatory Visit (INDEPENDENT_AMBULATORY_CARE_PROVIDER_SITE_OTHER): Payer: Medicare Other | Admitting: Family Medicine

## 2021-01-18 ENCOUNTER — Encounter: Payer: Self-pay | Admitting: Family Medicine

## 2021-01-18 ENCOUNTER — Other Ambulatory Visit: Payer: Self-pay

## 2021-01-18 VITALS — BP 130/77 | HR 63 | Temp 97.7°F | Wt 228.6 lb

## 2021-01-18 DIAGNOSIS — J449 Chronic obstructive pulmonary disease, unspecified: Secondary | ICD-10-CM

## 2021-01-18 DIAGNOSIS — I10 Essential (primary) hypertension: Secondary | ICD-10-CM

## 2021-01-18 DIAGNOSIS — N400 Enlarged prostate without lower urinary tract symptoms: Secondary | ICD-10-CM

## 2021-01-18 DIAGNOSIS — E782 Mixed hyperlipidemia: Secondary | ICD-10-CM | POA: Diagnosis not present

## 2021-01-18 DIAGNOSIS — R7303 Prediabetes: Secondary | ICD-10-CM

## 2021-01-18 DIAGNOSIS — I480 Paroxysmal atrial fibrillation: Secondary | ICD-10-CM | POA: Diagnosis not present

## 2021-01-18 DIAGNOSIS — E119 Type 2 diabetes mellitus without complications: Secondary | ICD-10-CM | POA: Diagnosis not present

## 2021-01-18 LAB — COMPREHENSIVE METABOLIC PANEL
ALT: 17 U/L (ref 0–53)
AST: 15 U/L (ref 0–37)
Albumin: 4 g/dL (ref 3.5–5.2)
Alkaline Phosphatase: 69 U/L (ref 39–117)
BUN: 13 mg/dL (ref 6–23)
CO2: 29 mEq/L (ref 19–32)
Calcium: 8.7 mg/dL (ref 8.4–10.5)
Chloride: 105 mEq/L (ref 96–112)
Creatinine, Ser: 0.77 mg/dL (ref 0.40–1.50)
GFR: 88.55 mL/min (ref >60.00)
Glucose, Bld: 104 mg/dL — ABNORMAL HIGH (ref 70–99)
Potassium: 3.8 mEq/L (ref 3.5–5.1)
Sodium: 140 mEq/L (ref 135–145)
Total Bilirubin: 1.9 mg/dL — ABNORMAL HIGH (ref 0.2–1.2)
Total Protein: 6.4 g/dL (ref 6.0–8.3)

## 2021-01-18 LAB — LIPID PANEL
Cholesterol: 133 mg/dL (ref 0–200)
HDL: 39.5 mg/dL (ref >39.00)
LDL Cholesterol: 67 mg/dL (ref 0–99)
NonHDL: 93.04
Total CHOL/HDL Ratio: 3
Triglycerides: 129 mg/dL (ref 0.0–149.0)
VLDL: 25.8 mg/dL (ref 0.0–40.0)

## 2021-01-18 LAB — TSH: TSH: 3.49 u[IU]/mL (ref 0.35–5.50)

## 2021-01-18 LAB — PSA: PSA: 0.93 ng/mL (ref 0.10–4.00)

## 2021-01-18 MED ORDER — ATORVASTATIN CALCIUM 20 MG PO TABS: ORAL_TABLET | ORAL | 3 refills | Status: DC

## 2021-01-18 MED ORDER — TAMSULOSIN HCL 0.4 MG PO CAPS: 0.4000 mg | ORAL_CAPSULE | Freq: Every day | ORAL | 1 refills | Status: DC

## 2021-01-18 NOTE — Assessment & Plan Note (Signed)
Check lipids.  Continue Lipitor 20 mg daily.

## 2021-01-18 NOTE — Assessment & Plan Note (Signed)
Continue Flomax 0.4 mg daily.  Check PSA.

## 2021-01-18 NOTE — Telephone Encounter (Signed)
Pharmacy, patient is on Xarelto for atrial fibrillation. Can you please comment on how long this can be held for upcoming hip surgery?  Thank you!

## 2021-01-18 NOTE — Assessment & Plan Note (Signed)
Regular rate and rhythm today.  On Xarelto.

## 2021-01-18 NOTE — Assessment & Plan Note (Signed)
Check A1c.  On metformin 500 mg daily.

## 2021-01-18 NOTE — Addendum Note (Signed)
Addended by: Loura Back on: 01/18/2021 10:54 AM   Modules accepted: Orders

## 2021-01-18 NOTE — Assessment & Plan Note (Signed)
Stable off medications.  

## 2021-01-18 NOTE — Patient Instructions (Signed)
It was very nice to see you today!  We will check blood work today.  Please continue the good work with diet and exercise.  I will see you back in  a year for your annual check up.  Come back to me sooner if needed.  Take care, Dr Jerline Pain  PLEASE NOTE:  If you had any lab tests please let us know if you have not heard back within a few days. You may see your results on mychart before we have a chance to review them but we will give you a call once they are reviewed by [place]. If we ordered any referrals today, please let us know if you have not heard from their office within the next week.   Please try these tips to maintain a healthy lifestyle:  Eat at least 3 REAL meals and 1-2 snacks per day.  Aim for no more than 5 hours between eating.  If you eat breakfast, please do so within one hour of getting up.   Each meal should contain half fruits/vegetables, one quarter protein, and one quarter carbs (no bigger than a computer mouse)  Cut down on sweet beverages. This includes juice, soda, and sweet tea.   Drink at least 1 glass of water with each meal and aim for at least 8 glasses per day  Exercise at least 150 minutes every week.    Preventive Care 74 Years and Older, Male Preventive care refers to lifestyle choices and visits with your health care provider that can promote health and wellness. Preventive care visits are also called wellness exams. What can I expect for my preventive care visit? Counseling During your preventive care visit, your health care provider may ask about your: Medical history, including: Past medical problems. Family medical history. History of falls. Current health, including: Emotional well-being. Home life and relationship well-being. Sexual activity. Memory and ability to understand (cognition). Lifestyle, including: Alcohol, nicotine or tobacco, and drug use. Access to firearms. Diet, exercise, and sleep habits. Work and work  Statistician. Sunscreen use. Safety issues such as seatbelt and bike helmet use. Physical exam Your health care provider will check your: Height and weight. These may be used to calculate your BMI (body mass index). BMI is a measurement that tells if you are at a healthy weight. Waist circumference. This measures the distance around your waistline. This measurement also tells if you are at a healthy weight and may help predict your risk of certain diseases, such as type 2 diabetes and high blood pressure. Heart rate and blood pressure. Body temperature. Skin for abnormal spots. What immunizations do I need? Vaccines are usually given at various ages, according to a schedule. Your health care provider will recommend vaccines for you based on your age, medical history, and lifestyle or other factors, such as travel or where you work. What tests do I need? Screening Your health care provider may recommend screening tests for certain conditions. This may include: Lipid and cholesterol levels. Diabetes screening. This is done by checking your blood sugar (glucose) after you have not eaten for a while (fasting). Hepatitis C test. Hepatitis B test. HIV (human immunodeficiency virus) test. STI (sexually transmitted infection) testing, if you are at risk. Lung cancer screening. Colorectal cancer screening. Prostate cancer screening. Abdominal aortic aneurysm (AAA) screening. You may need this if you are a current or former smoker. Talk with your health care provider about your test results, treatment options, and if necessary, the need for more tests. Follow  these instructions at home: Eating and drinking  Eat a diet that includes fresh fruits and vegetables, whole grains, lean protein, and low-fat dairy products. Limit your intake of foods with high amounts of sugar, saturated fats, and salt. Take vitamin and mineral supplements as recommended by your health care provider. Do not drink alcohol  if your health care provider tells you not to drink. If you drink alcohol: Limit how much you have to 0-2 drinks a day. Know how much alcohol is in your drink. In the [place] one drink equals one 12 oz bottle of beer (355 mL), one 5 oz glass of wine (148 mL), or one 1 oz glass of hard liquor (44 mL). Lifestyle Brush your teeth every morning and night with fluoride toothpaste. Floss one time each day. Exercise for at least 30 minutes 5 or more days each week. Do not use any products that contain nicotine or tobacco. These products include cigarettes, chewing tobacco, and vaping devices, such as e-cigarettes. If you need help quitting, ask your health care provider. Do not use drugs. If you are sexually active, practice safe sex. Use a condom or other form of protection to prevent STIs. Take aspirin only as told by your health care provider. Make sure that you understand how much to take and what form to take. Work with your health care provider to find out whether it is safe and beneficial for you to take aspirin daily. Ask your health care provider if you need to take a cholesterol-lowering medicine (statin). Find healthy ways to manage stress, such as: Meditation, yoga, or listening to music. Journaling. Talking to a trusted person. Spending time with friends and family. Safety Always wear your seat belt while driving or riding in a vehicle. Do not drive: If you have been drinking alcohol. Do not ride with someone who has been drinking. When you are tired or distracted. While texting. If you have been using any mind-altering substances or drugs. Wear a helmet and other protective equipment during sports activities. If you have firearms in your house, make sure you follow all gun safety procedures. Minimize exposure to UV radiation to reduce your risk of skin cancer. What's next? Visit your health care provider once a year for an annual wellness visit. Ask your health care provider how  often you should have your eyes and teeth checked. Stay up to date on all vaccines. This information is not intended to replace advice given to you by your health care provider. Make sure you discuss any questions you have with your health care provider. Document Revised: 08/24/2020 Document Reviewed: 08/24/2020 Elsevier Patient Education  [place].

## 2021-01-18 NOTE — Assessment & Plan Note (Signed)
At goal on amlodipine 10 mg daily, Coreg 12.5 mg twice daily

## 2021-01-18 NOTE — Addendum Note (Signed)
Addended by: Loura Back on: 01/18/2021 10:55 AM   Modules accepted: Orders

## 2021-01-18 NOTE — Progress Notes (Signed)
   Steven Shepard is a 75 y.o. male who presents today for an office visit.  Assessment/Plan:  Chronic Problems Addressed Today: BPH (benign prostatic hyperplasia) Continue Flomax 0.4 mg daily.  Check PSA.  Chronic obstructive pulmonary disease (HCC) Stable off medications.   Essential hypertension At goal on amlodipine 10 mg daily, Coreg 12.5 mg twice daily  Hyperlipidemia Check lipids.  Continue Lipitor 20 mg daily.  Prediabetes Check A1c.  On metformin 500 mg daily.  Paroxysmal atrial fibrillation (HCC) Regular rate and rhythm today.  On Xarelto.  He will check with the pharmacy for shingles vaccine.   Follow up in 1 year for next annual check up.     Subjective:  HPI:  See a/p for status of chronic conditions.         Objective:  Physical Exam: BP 130/77   Pulse 63   Temp 97.7 F (36.5 C) (Temporal)   Wt 228 lb 9.6 oz (103.7 kg)   SpO2 97%   BMI 30.16 kg/m   Gen: No acute distress, resting comfortably CV: Regular rate and rhythm with no murmurs appreciated Pulm: Normal work of breathing, clear to auscultation bilaterally with no crackles, wheezes, or rhonchi Neuro: Grossly normal, moves all extremities Psych: Normal affect and thought content      Steven Shepard M. Jerline Pain, MD 01/18/2021 8:20 AM

## 2021-01-19 LAB — CBC
HCT: 37.6 % — ABNORMAL LOW (ref 38.5–50.0)
Hemoglobin: 12.3 g/dL — ABNORMAL LOW (ref 13.2–17.1)
MCH: 30.3 pg (ref 27.0–33.0)
MCHC: 32.7 g/dL (ref 32.0–36.0)
MCV: 92.6 fL (ref 80.0–100.0)
MPV: 9.8 fL (ref 7.5–12.5)
Platelets: 204 10*3/uL (ref 140–400)
RBC: 4.06 10*6/uL — ABNORMAL LOW (ref 4.20–5.80)
RDW: 13.5 % (ref 11.0–15.0)
WBC: 6 10*3/uL (ref 3.8–10.8)

## 2021-01-19 LAB — HEMOGLOBIN A1C
Hgb A1c MFr Bld: 5.4 % of total Hgb (ref <5.7)
Mean Plasma Glucose: 108 mg/dL
eAG (mmol/L): 6 mmol/L

## 2021-01-19 NOTE — Progress Notes (Signed)
Please inform patient of the following:  Labs are all stable compared to last year. Do not need to make any changes at this time. We can recheck in a year.

## 2021-01-20 NOTE — Telephone Encounter (Signed)
    Patient Name: Steven Shepard  DOB: 1946/02/22 MRN: [id number]  Primary Cardiologist: Buford Dresser, MD  Chart reviewed as part of pre-operative protocol coverage. Given past medical history and time since last visit, based on ACC/AHA guidelines, Steven Shepard would be at acceptable risk for the planned procedure without further cardiovascular testing.   I will route this recommendation to the requesting party via Epic fax function and remove from pre-op pool.  Please call with questions.  [place], [place] 01/20/2021, 8:23 AM

## 2021-01-20 NOTE — Telephone Encounter (Signed)
Patient with diagnosis of A Fib on Xarelto for anticoagulation.    Procedure:  Left Total Hip Arthroplasty Anterior  Date of procedure: 03/15/21   CHA2DS2-VASc Score = 4  This indicates a 4.8% annual risk of stroke. The patient's score is based upon: CHF History: 1 HTN History: 1 Diabetes History: 0 Stroke History: 0 Vascular Disease History: 1 Age Score: 1 Gender Score: 0    CrCl 108 mL/min Platelet count 204K   Per office protocol, patient can hold Xarelto  for 3 days prior to procedure.

## 2021-02-16 DIAGNOSIS — M1612 Unilateral primary osteoarthritis, left hip: Secondary | ICD-10-CM | POA: Diagnosis not present

## 2021-02-21 ENCOUNTER — Ambulatory Visit (INDEPENDENT_AMBULATORY_CARE_PROVIDER_SITE_OTHER): Payer: Medicare Other

## 2021-02-21 ENCOUNTER — Other Ambulatory Visit: Payer: Self-pay

## 2021-02-21 DIAGNOSIS — Z0000 Encounter for general adult medical examination without abnormal findings: Secondary | ICD-10-CM

## 2021-02-21 NOTE — Progress Notes (Signed)
Virtual Visit via Telephone Note  I connected with  Steven Shepard on 02/21/21 at  8:00 AM EST by telephone and verified that I am speaking with the correct person using two identifiers.  Medicare Annual Wellness visit completed telephonically due to Covid-19 pandemic.   Persons participating in this call: This Health Coach and this patient.   Location: Patient: Home Provider: Office   I discussed the limitations, risks, security and privacy concerns of performing an evaluation and management service by telephone and the availability of in person appointments. The patient expressed understanding and agreed to proceed.  Unable to perform video visit due to video visit attempted and failed and/or patient does not have video capability.   Some vital signs may be absent or patient reported.   Willette Brace, LPN   Subjective:   Steven Shepard is a 75 y.o. male who presents for Medicare Annual/Subsequent preventive examination.  Review of Systems     Cardiac Risk Factors include: advanced age (>8mn, >>58women);male gender;dyslipidemia;hypertension;obesity (BMI >30kg/m2)     Objective:    There were no vitals filed for this visit. There is no height or weight on file to calculate BMI.  Advanced Directives 02/21/2021 03/23/2020 02/18/2020 08/19/2019 01/14/2019 08/19/2018 01/08/2018  Does Patient Have a Medical Advance Directive? Yes Yes Yes Yes Yes No Yes  Type of AParamedicof AQuitmanLiving will H[place]Living will HVictoriaLiving will HLongboat KeyLiving will HLyonsLiving will - HBaldwinLiving will  Does patient want to make changes to medical advance directive? - - - - No - Patient declined - No - Patient declined  Copy of HMcEwens[place]in Chart? No - copy requested - No - copy requested - No - copy requested - No - copy requested  Would  patient like information on creating a medical advance directive? - - - - - No - Patient declined -    Current Medications (verified) Outpatient Encounter Medications as of 02/21/2021  Medication Sig   acetaminophen (TYLENOL) 650 MG CR tablet Take 650 mg by mouth 2 (two) times daily as needed for pain.   amLODipine (NORVASC) 10 MG tablet Take 1 tablet (10 mg total) by mouth daily.   atorvastatin (LIPITOR) 20 MG tablet TAKE 1 TABLET(20 MG) BY MOUTH DAILY   carvedilol (COREG) 12.5 MG tablet Take 1 tablet (12.5 mg total) by mouth 2 (two) times daily.   Fexofenadine HCl (ALLEGRA PO) Take 1 tablet by mouth every morning.    fluorouracil (EFUDEX) 5 % cream Apply topically.   folic acid (FOLVITE) 4510MCG tablet Take 400 mcg by mouth 2 (two) times daily.   furosemide (LASIX) 40 MG tablet TAKE 1 TABLET BY MOUTH TWICE DAILY AT 8 AM AND AT 2 PM   metFORMIN (GLUCOPHAGE) 500 MG tablet Take 1 tablet (500 mg total) by mouth daily with breakfast.   Multiple Vitamin (MULTIVITAMIN) capsule Take 1 capsule by mouth daily.   Multiple Vitamins-Minerals (EMERGEN-C IMMUNE PO) Take 1 tablet by mouth daily.    Omega-3 Fatty Acids (OMEGA 3 500) 500 MG CAPS Take 500 capsules by mouth daily.   potassium chloride SA (KLOR-CON) 20 MEQ tablet Take 1 tablet (20 mEq total) by mouth 2 (two) times daily.   Psyllium (METAMUCIL FIBER PO) Take 1 Dose by mouth daily.    rivaroxaban (XARELTO) 20 MG TABS tablet Take 1 tablet (20 mg total) by mouth daily with supper.  tamsulosin (FLOMAX) 0.4 MG CAPS capsule Take 1 capsule (0.4 mg total) by mouth daily.   amoxicillin (AMOXIL) 500 MG capsule amoxicillin 500 mg capsule  TK FOUR CS PO 1 HOUR B DAPP   No facility-administered encounter medications on file as of 02/21/2021.    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 Maureen Ralphs   Family History  Problem Relation Age of Onset   Coronary artery disease Mother    Hypertension Mother    Heart attack Mother    Coronary artery disease Father    Hypertension Father    Heart attack Father    Diabetes Sister    Hypertension Sister    Diabetes Brother    Cancer Brother    Colon cancer Neg Hx    Colonic polyp Neg Hx    Social History   Socioeconomic History   Marital status: Married    Spouse name: Not on file   Number of children: Not on file   Years of education: Not on file   Highest education level: Not on file  Occupational History   Occupation: Architectural technologist: [employer]    Comment: retired  Tobacco Use   Smoking status: Former    Packs/day: 1.50    Years: 3.00    Pack years: 4.50    Types: Cigarettes, Pipe    Quit date: 03/12/1973    Years since quitting: 47.9   Smokeless tobacco: Never   Tobacco comments:    quit appox 36 years ago  Vaping Use   Vaping Use: Never used  Substance and Sexual Activity   Alcohol use: Yes    Comment: occasionally, 1-7 drinks per week   Drug use: No   Sexual activity: Yes  Other Topics Concern   Not on file  Social History Narrative   Not on file   Social Determinants of Health   Financial Resource Strain: Low Risk    Difficulty of Paying Living Expenses: Not hard at all  Food Insecurity: No Food  Insecurity   Worried About Charity fundraiser in the Last Year: Never true   [place] in the Last Year: Never true  Transportation Needs: No Transportation Needs   Lack of Transportation (Medical): No   Lack of Transportation (Non-Medical): No  Physical Activity: Sufficiently Active   Days of Exercise per Week: 7 days   Minutes of Exercise per Session: 50 min  Stress: No Stress Concern Present   Feeling of Stress : Not at all  Social Connections: Socially Integrated   Frequency  of Communication with Friends and Family: More than three times a week   Frequency of Social Gatherings with Friends and Family: More than three times a week   Attends Religious Services: More than 4 times per year   Active Member of [employer] or Organizations: Yes   Attends Archivist Meetings: 1 to 4 times per year   Marital Status: Married    Tobacco Counseling Counseling given: Not Answered Tobacco comments: quit appox 36 years ago   Clinical Intake:  Pre-visit preparation completed: Yes  Pain : No/denies pain     BMI - recorded: 30.17 Nutritional Status: BMI > 30  Obese Nutritional Risks: None Diabetes: No  How often do you need to have someone help you when you read instructions, pamphlets, or other written materials from your doctor or pharmacy?: 1 - Never  Diabetic?No  Interpreter Needed?: No  Information entered by :: Charlott Rakes, LPN   Activities of Daily Living In your present state of health, do you have any difficulty performing the following activities: 02/21/2021  Hearing? N  Vision? N  Difficulty concentrating or making decisions? N  Walking or climbing stairs? N  Dressing or bathing? N  Doing errands, shopping? N  Preparing Food and eating ? N  Using the Toilet? N  In the past six months, have you accidently leaked urine? N  Do you have problems with loss of bowel control? N  Managing your Medications? N  Managing your Finances? N  Housekeeping or  managing your Housekeeping? N  Some recent data might be hidden    Patient Care Team: Vivi Barrack, MD as PCP - General (Family Medicine) Buford Dresser, MD as PCP - Cardiology (Cardiology) Sharyne Peach, MD as Consulting Physician (Ophthalmology) Dorothy Spark, MD (Inactive) as Consulting Physician (Cardiology) Jovita Gamma, MD as Consulting Physician (Neurosurgery) Laurence Spates, MD (Inactive) as Consulting Physician (Gastroenterology) Martinique, Amy, MD as Consulting Physician (Dermatology) Rigoberto Noel, MD as Consulting Physician (Pulmonary Disease) Volanda Napoleon, MD as Consulting Physician (Oncology)  Indicate any recent Medical Services you may have received from other than [facility] providers in the past year (date may be approximate).     Assessment:   This is a routine wellness examination for [facility].  Hearing/Vision screen Hearing Screening - Comments:: Pt denies any hearing issues  Vision Screening - Comments:: Pt follows up with Dr Melissa Noon for annual eye exams   Dietary issues and exercise activities discussed: Current Exercise Habits: Home exercise routine, Type of exercise: walking, Time (Minutes): 45, Frequency (Times/Week): 7, Weekly Exercise (Minutes/Week): 315   Goals Addressed             This Visit's Progress    Patient Stated       Lose weight        Depression Screen PHQ 2/9 Scores 02/21/2021 11/01/2020 02/18/2020 01/13/2020 07/15/2019 01/14/2019 01/08/2018  PHQ - 2 Score 0 0 0 0 0 0 0    Fall Risk Fall Risk  02/21/2021 11/01/2020 02/18/2020 01/13/2020 07/15/2019  Falls in the past year? 0 0 1 0 0  Comment - - - - -  Number falls in past yr: 0 0 1 - -  Injury with Fall? 0 0 1 - -  Comment - - scrape on knuckles from dog pulling him - -  Risk for fall due to : Impaired vision - Impaired vision No Fall Risks No Fall Risks  Follow up Falls prevention discussed - - - -  FALL RISK PREVENTION PERTAINING TO THE HOME:  Any stairs  in or around the home? Yes  If so, are there any without handrails? No  Home free of loose throw rugs in walkways, pet beds, electrical cords, etc? Yes  Adequate lighting in your home to reduce risk of falls? Yes   ASSISTIVE DEVICES UTILIZED TO PREVENT FALLS:  Life alert? No  Use of a cane, walker or w/c? No  Grab bars in the bathroom? No  Shower chair or bench in shower? Yes  Elevated toilet seat or a handicapped toilet? No   TIMED UP AND GO:  Was the test performed? No .  Cognitive Function:     6CIT Screen 02/21/2021 02/18/2020 01/14/2019 01/08/2018  What Year? 0 points 0 points 0 points 0 points  What month? 0 points 0 points 0 points 0 points  What time? 0 points - 0 points 0 points  Count back from 20 0 points 0 points 0 points 0 points  Months in reverse 0 points 0 points 0 points 0 points  Repeat phrase 0 points 0 points 0 points 0 points  Total Score 0 - 0 0    Immunizations Immunization History  Administered Date(s) Administered   Fluad Quad(high Dose 65+) 12/17/2018, 01/12/2020, 01/12/2021   Influenza Split 12/11/2011, 12/10/2012   Influenza, High Dose Seasonal PF 01/09/2012, 01/09/2013, 01/11/2014, 12/14/2015, 01/01/2018   Influenza-Unspecified 01/09/2012, 01/09/2013, 12/10/2013, 01/11/2014, 12/14/2015, 11/25/2016   PFIZER(Purple Top)SARS-COV-2 Vaccination 03/31/2019, 04/21/2019, 12/18/2019   Pneumococcal Conjugate-13 11/15/2015   Pneumococcal Polysaccharide-23 09/09/2009, 06/07/2010, 01/11/2018   Tdap 11/20/2005, 01/11/2018   Zoster, Live 03/12/2010    TDAP status: Up to date  Flu Vaccine status: Up to date  Pneumococcal vaccine status: Up to date  Covid-19 vaccine status: Completed vaccines  Qualifies for Shingles Vaccine? Yes   Zostavax completed Yes   Shingrix Completed?: Yes  Screening Tests Health Maintenance  Topic Date Due   Zoster Vaccines- Shingrix (1 of 2) Never done   URINE MICROALBUMIN  01/14/2020   COVID-19 Vaccine (4 - Booster for  Pfizer series) 02/12/2020   COLONOSCOPY (Pts 45-78yr Insurance coverage will need to be confirmed)  03/12/2021   TETANUS/TDAP  01/12/2028   Pneumonia Vaccine 74 Years old  Completed   INFLUENZA VACCINE  Completed   Hepatitis C Screening  Completed   HPV VACCINES  Aged Out    Health Maintenance  Health Maintenance Due  Topic Date Due   Zoster Vaccines- Shingrix (1 of 2) Never done   URINE MICROALBUMIN  01/14/2020   COVID-19 Vaccine (4 - Booster for PKenlyseries) 02/12/2020    Colorectal cancer screening: Type of screening: Colonoscopy. Completed 03/13/11. Repeat every 10 years   Additional Screening:  Hepatitis C Screening:  Completed 03/01/17  Vision Screening: Recommended annual ophthalmology exams for early detection of glaucoma and other disorders of the eye. Is the patient up to date with their annual eye exam?  Yes  Who is the provider or what is the name of the office in which the patient attends annual eye exams? Dr JMelissa NoonIf pt is not established with a provider, would they like to be referred to a provider to establish care? No .   Dental Screening: Recommended annual dental exams for proper oral hygiene  Community Resource Referral / Chronic Care Management: CRR required this visit?  No   CCM required this visit?  No      Plan:     I have personally reviewed and noted the following  in the patient's chart:   Medical and social history Use of alcohol, tobacco or illicit drugs  Current medications and supplements including opioid prescriptions. Patient is not currently taking opioid prescriptions. Functional ability and status Nutritional status Physical activity Advanced directives List of other physicians Hospitalizations, surgeries, and ER visits in previous 12 months Vitals Screenings to include cognitive, depression, and falls Referrals and appointments  In addition, I have reviewed and discussed with patient certain preventive protocols,  quality metrics, and best practice recommendations. A written personalized care plan for preventive services as well as general preventive health recommendations were provided to patient.     Willette Brace, LPN   [id number]   Nurse Notes: None at this time

## 2021-02-21 NOTE — Patient Instructions (Signed)
[name] , Thank you for taking time to come for your Medicare Wellness Visit. I appreciate your ongoing commitment to your health goals. Please review the following plan we discussed and let me know if I can assist you in the future.   Screening recommendations/referrals: Colonoscopy: Done 03/13/11 repeat every 10 years  Recommended yearly ophthalmology/optometry visit for glaucoma screening and checkup Recommended yearly dental visit for hygiene and checkup  Vaccinations: Influenza vaccine: Done 01/12/21 repeat every year Pneumococcal vaccine: Up to date Tdap vaccine: Done 01/11/18 repeat every 10 years  Shingles vaccine: will call to follow up dates   Covid-19: Completed 1/19, 2/9, &12/18/19  Advanced directives: Please bring a copy of your health care power of attorney and living will to the office at your convenience.  Conditions/risks identified: Lose weight  Next appointment: Follow up in one year for your annual wellness visit.   Preventive Care 74 Years and Older, Male Preventive care refers to lifestyle choices and visits with your health care provider that can promote health and wellness. What does preventive care include? A yearly physical exam. This is also called an annual well check. Dental exams once or twice a year. Routine eye exams. Ask your health care provider how often you should have your eyes checked. Personal lifestyle choices, including: Daily care of your teeth and gums. Regular physical activity. Eating a healthy diet. Avoiding tobacco and drug use. Limiting alcohol use. Practicing safe sex. Taking low doses of aspirin every day. Taking vitamin and mineral supplements as recommended by your health care provider. What happens during an annual well check? The services and screenings done by your health care provider during your annual well check will depend on your age, overall health, lifestyle risk factors, and family history of disease. Counseling   Your health care provider may ask you questions about your: Alcohol use. Tobacco use. Drug use. Emotional well-being. Home and relationship well-being. Sexual activity. Eating habits. History of falls. Memory and ability to understand (cognition). Work and work Statistician. Screening  You may have the following tests or measurements: Height, weight, and BMI. Blood pressure. Lipid and cholesterol levels. These may be checked every 5 years, or more frequently if you are over 19 years old. Skin check. Lung cancer screening. You may have this screening every year starting at age 11 if you have a 30-pack-year history of smoking and currently smoke or have quit within the past 15 years. Fecal occult blood test (FOBT) of the stool. You may have this test every year starting at age 18. Flexible sigmoidoscopy or colonoscopy. You may have a sigmoidoscopy every 5 years or a colonoscopy every 10 years starting at age 17. Prostate cancer screening. Recommendations will vary depending on your family history and other risks. Hepatitis C blood test. Hepatitis B blood test. Sexually transmitted disease (STD) testing. Diabetes screening. This is done by checking your blood sugar (glucose) after you have not eaten for a while (fasting). You may have this done every 1-3 years. Abdominal aortic aneurysm (AAA) screening. You may need this if you are a current or former smoker. Osteoporosis. You may be screened starting at age 10 if you are at high risk. Talk with your health care provider about your test results, treatment options, and if necessary, the need for more tests. Vaccines  Your health care provider may recommend certain vaccines, such as: Influenza vaccine. This is recommended every year. Tetanus, diphtheria, and acellular pertussis (Tdap, Td) vaccine. You may need a Td booster every  10 years. Zoster vaccine. You may need this after age 38. Pneumococcal 13-valent conjugate (PCV13) vaccine.  One dose is recommended after age 71. Pneumococcal polysaccharide (PPSV23) vaccine. One dose is recommended after age 40. Talk to your health care provider about which screenings and vaccines you need and how often you need them. This information is not intended to replace advice given to you by your health care provider. Make sure you discuss any questions you have with your health care provider. Document Released: 03/25/2015 Document Revised: 11/16/2015 Document Reviewed: 12/28/2014 Elsevier Interactive Patient Education  2017 [place] Prevention in the Home Falls can cause injuries. They can happen to people of all ages. There are many things you can do to make your home safe and to help prevent falls. What can I do on the outside of my home? Regularly fix the edges of walkways and driveways and fix any cracks. Remove anything that might make you trip as you walk through a door, such as a raised step or threshold. Trim any bushes or trees on the path to your home. Use bright outdoor lighting. Clear any walking paths of anything that might make someone trip, such as rocks or tools. Regularly check to see if handrails are loose or broken. Make sure that both sides of any steps have handrails. Any raised decks and porches should have guardrails on the edges. Have any leaves, snow, or ice cleared regularly. Use sand or salt on walking paths during winter. Clean up any spills in your garage right away. This includes oil or grease spills. What can I do in the bathroom? Use night lights. Install grab bars by the toilet and in the tub and shower. Do not use towel bars as grab bars. Use non-skid mats or decals in the tub or shower. If you need to sit down in the shower, use a plastic, non-slip stool. Keep the floor dry. Clean up any water that spills on the floor as soon as it happens. Remove soap buildup in the tub or shower regularly. Attach bath mats securely with double-sided  non-slip rug tape. Do not have throw rugs and other things on the floor that can make you trip. What can I do in the bedroom? Use night lights. Make sure that you have a light by your bed that is easy to reach. Do not use any sheets or blankets that are too big for your bed. They should not hang down onto the floor. Have a firm chair that has side arms. You can use this for support while you get dressed. Do not have throw rugs and other things on the floor that can make you trip. What can I do in the kitchen? Clean up any spills right away. Avoid walking on wet floors. Keep items that you use a lot in easy-to-reach places. If you need to reach something above you, use a strong step stool that has a grab bar. Keep electrical cords out of the way. Do not use floor polish or wax that makes floors slippery. If you must use wax, use non-skid floor wax. Do not have throw rugs and other things on the floor that can make you trip. What can I do with my stairs? Do not leave any items on the stairs. Make sure that there are handrails on both sides of the stairs and use them. Fix handrails that are broken or loose. Make sure that handrails are as long as the stairways. Check any carpeting to make  sure that it is firmly attached to the stairs. Fix any carpet that is loose or worn. Avoid having throw rugs at the top or bottom of the stairs. If you do have throw rugs, attach them to the floor with carpet tape. Make sure that you have a light switch at the top of the stairs and the bottom of the stairs. If you do not have them, ask someone to add them for you. What else can I do to help prevent falls? Wear shoes that: Do not have high heels. Have rubber bottoms. Are comfortable and fit you well. Are closed at the toe. Do not wear sandals. If you use a stepladder: Make sure that it is fully opened. Do not climb a closed stepladder. Make sure that both sides of the stepladder are locked into place. Ask  someone to hold it for you, if possible. Clearly mark and make sure that you can see: Any grab bars or handrails. First and last steps. Where the edge of each step is. Use tools that help you move around (mobility aids) if they are needed. These include: Canes. Walkers. Scooters. Crutches. Turn on the lights when you go into a dark area. Replace any light bulbs as soon as they burn out. Set up your furniture so you have a clear path. Avoid moving your furniture around. If any of your floors are uneven, fix them. If there are any pets around you, be aware of where they are. Review your medicines with your doctor. Some medicines can make you feel dizzy. This can increase your chance of falling. Ask your doctor what other things that you can do to help prevent falls. This information is not intended to replace advice given to you by your health care provider. Make sure you discuss any questions you have with your health care provider. Document Released: 12/23/2008 Document Revised: 08/04/2015 Document Reviewed: 04/02/2014 Elsevier Interactive Patient Education  2017 [employer].

## 2021-02-23 ENCOUNTER — Ambulatory Visit: Payer: Medicare Other

## 2021-02-28 DIAGNOSIS — D225 Melanocytic nevi of trunk: Secondary | ICD-10-CM | POA: Diagnosis not present

## 2021-02-28 DIAGNOSIS — L821 Other seborrheic keratosis: Secondary | ICD-10-CM | POA: Diagnosis not present

## 2021-02-28 DIAGNOSIS — D2372 Other benign neoplasm of skin of left lower limb, including hip: Secondary | ICD-10-CM | POA: Diagnosis not present

## 2021-02-28 DIAGNOSIS — L570 Actinic keratosis: Secondary | ICD-10-CM | POA: Diagnosis not present

## 2021-02-28 DIAGNOSIS — D485 Neoplasm of uncertain behavior of skin: Secondary | ICD-10-CM | POA: Diagnosis not present

## 2021-02-28 DIAGNOSIS — L814 Other melanin hyperpigmentation: Secondary | ICD-10-CM | POA: Diagnosis not present

## 2021-02-28 DIAGNOSIS — Z85828 Personal history of other malignant neoplasm of skin: Secondary | ICD-10-CM | POA: Diagnosis not present

## 2021-03-01 NOTE — Patient Instructions (Addendum)
DUE TO COVID-19 ONLY ONE VISITOR IS ALLOWED TO COME WITH YOU AND STAY IN THE WAITING ROOM ONLY DURING PRE OP AND PROCEDURE.   **NO VISITORS ARE ALLOWED IN THE SHORT STAY AREA OR RECOVERY ROOM!!**  IF YOU WILL BE ADMITTED INTO THE HOSPITAL YOU ARE ALLOWED ONLY TWO SUPPORT PEOPLE DURING VISITATION HOURS ONLY (7 AM -8PM)   The support person(s) must pass our screening, gel in and out, and wear a mask at all times, including in the patients room. Patients must also wear a mask when staff or their support person are in the room. Visitors GUEST BADGE MUST BE WORN VISIBLY  One adult visitor may remain with you overnight and MUST be in the room by 8 P.M.  No visitors under the age of 56. Any visitor under the age of 12 must be accompanied by an adult.   Your procedure is scheduled on: 03/15/20   Report to [facility] Main Entrance    Report to admitting at : 10:45 AM   Call this number if you have problems the morning of surgery [phone]   Do not eat food :After Midnight.   May have liquids until : 10:30 AM   day of surgery  CLEAR LIQUID DIET  Foods Allowed                                                                     Foods Excluded  Water, Black Coffee and tea, regular and decaf                             liquids that you cannot  Plain Jell-O in any flavor  (No red)                                           see through such as: Fruit ices (not with fruit pulp)                                     milk, soups, orange juice              Iced Popsicles (No red)                                    All solid food                                   Apple juices Sports drinks like Gatorade (No red) Lightly seasoned clear broth or consume(fat free) Sugar  Sample Menu Breakfast                                Lunch  Supper Cranberry juice                    Beef broth                            Chicken broth Jell-O                                      Grape juice                           Apple juice Coffee or tea                        Jell-O                                      Popsicle                                                Coffee or tea                        Coffee or tea      Complete one Gatorade drink the morning of surgery at : 10:30 AM      the day of surgery.    The day of surgery:  Drink ONE (1) Pre-Surgery Clear Ensure or G2 by am the morning of surgery. Drink in one sitting. Do not sip.  This drink was given to you during your hospital  pre-op appointment visit. Nothing else to drink after completing the  Pre-Surgery Clear Ensure or G2.          If you have questions, please contact your surgeons office.     Oral Hygiene is also important to reduce your risk of infection.                                    Remember - BRUSH YOUR TEETH THE MORNING OF SURGERY WITH YOUR REGULAR TOOTHPASTE   Do NOT smoke after Midnight   Take these medicines the morning of surgery with A SIP OF WATER: allegra,carvedilol,amlodipine. How to Manage Your Diabetes Before and After Surgery  Why is it important to control my blood sugar before and after surgery? Improving blood sugar levels before and after surgery helps healing and can limit problems. A way of improving blood sugar control is eating a healthy diet by:  Eating less sugar and carbohydrates  Increasing activity/exercise  Talking with your doctor about reaching your blood sugar goals High blood sugars (greater than 180 mg/dL) can raise your risk of infections and slow your recovery, so you will need to focus on controlling your diabetes during the weeks before surgery. Make sure that the doctor who takes care of your diabetes knows about your planned surgery including the date and location.  How do I manage my blood sugar before surgery? Check your blood sugar at least 4 times a day, starting 2 days before surgery, to make sure that the  level is not too high or  low. Check your blood sugar the morning of your surgery when you wake up and every 2 hours until you get to the Short Stay unit. If your blood sugar is less than 70 mg/dL, you will need to treat for low blood sugar: Do not take insulin. Treat a low blood sugar (less than 70 mg/dL) with  cup of clear juice (cranberry or apple), 4 glucose tablets, OR glucose gel. Recheck blood sugar in 15 minutes after treatment (to make sure it is greater than 70 mg/dL). If your blood sugar is not greater than 70 mg/dL on recheck, call [phone] for further instructions. Report your blood sugar to the short stay nurse when you get to Short Stay.  If you are admitted to the hospital after surgery: Your blood sugar will be checked by the staff and you will probably be given insulin after surgery (instead of oral diabetes medicines) to make sure you have good blood sugar levels. The goal for blood sugar control after surgery is 80-180 mg/dL.   WHAT DO I DO ABOUT MY DIABETES MEDICATION?  Do not take oral diabetes medicines (pills) the morning of surgery.  THE DAY BEFORE SURGERY, take metformin as usual.      THE MORNING OF SURGERY, DO NOT TAKE ANY ORAL DIABETIC MEDICATIONS DAY OF YOUR SURGERY                              You may not have any metal on your body including hair pins, jewelry, and body piercing             Do not wear  lotions, powders, perfumes/cologne, or deodorant              Men may shave face and neck.   Do not bring valuables to the hospital. [place].   Contacts, dentures or bridgework may not be worn into surgery.   Bring small overnight bag day of surgery.    Patients discharged on the day of surgery will not be allowed to drive home.   Special Instructions: Bring a copy of your healthcare power of attorney and living will documents         the day of surgery if you haven't scanned them before.              Please read over  the following fact sheets you were given: IF YOU HAVE QUESTIONS ABOUT YOUR PRE-OP INSTRUCTIONS PLEASE CALL [phone]     [facility] - Preparing for Surgery Before surgery, you can play an important role.  Because skin is not sterile, your skin needs to be as free of germs as possible.  You can reduce the number of germs on your skin by washing with CHG (chlorahexidine gluconate) soap before surgery.  CHG is an antiseptic cleaner which kills germs and bonds with the skin to continue killing germs even after washing. Please DO NOT use if you have an allergy to CHG or antibacterial soaps.  If your skin becomes reddened/irritated stop using the CHG and inform your nurse when you arrive at Short Stay. Do not shave (including legs and underarms) for at least 48 hours prior to the first CHG shower.  You may shave your face/neck. Please follow these instructions carefully:  1.  Shower with CHG  Soap the night before surgery and the  morning of Surgery.  2.  If you choose to wash your hair, wash your hair first as usual with your  normal  shampoo.  3.  After you shampoo, rinse your hair and body thoroughly to remove the  shampoo.                           4.  Use CHG as you would any other liquid soap.  You can apply chg directly  to the skin and wash                       Gently with a scrungie or clean washcloth.  5.  Apply the CHG Soap to your body ONLY FROM THE NECK DOWN.   Do not use on face/ open                           Wound or open sores. Avoid contact with eyes, ears mouth and genitals (private parts).                       Wash face,  Genitals (private parts) with your normal soap.             6.  Wash thoroughly, paying special attention to the area where your surgery  will be performed.  7.  Thoroughly rinse your body with warm water from the neck down.  8.  DO NOT shower/wash with your normal soap after using and rinsing off  the CHG Soap.                9.  Pat yourself dry with a clean  towel.            10.  Wear clean pajamas.            11.  Place clean sheets on your bed the night of your first shower and do not  sleep with pets. Day of Surgery : Do not apply any lotions/deodorants the morning of surgery.  Please wear clean clothes to the hospital/surgery center.  FAILURE TO FOLLOW THESE INSTRUCTIONS MAY RESULT IN THE CANCELLATION OF YOUR SURGERY PATIENT SIGNATURE_________________________________  NURSE SIGNATURE__________________________________  ________________________________________________________________________   Adam Phenix  An incentive spirometer is a tool that can help keep your lungs clear and active. This tool measures how well you are filling your lungs with each breath. Taking long deep breaths may help reverse or decrease the chance of developing breathing (pulmonary) problems (especially infection) following: A long period of time when you are unable to move or be active. BEFORE THE PROCEDURE  If the spirometer includes an indicator to show your best effort, your nurse or respiratory therapist will set it to a desired goal. If possible, sit up straight or lean slightly forward. Try not to slouch. Hold the incentive spirometer in an upright position. INSTRUCTIONS FOR USE  Sit on the edge of your bed if possible, or sit up as far as you can in bed or on a chair. Hold the incentive spirometer in an upright position. Breathe out normally. Place the mouthpiece in your mouth and seal your lips tightly around it. Breathe in slowly and as deeply as possible, raising the piston or the ball toward the top of the column. Hold your breath for 3-5 seconds or for as long as possible. Allow the  piston or ball to fall to the bottom of the column. Remove the mouthpiece from your mouth and breathe out normally. Rest for a few seconds and repeat Steps 1 through 7 at least 10 times every 1-2 hours when you are awake. Take your time and take a few normal  breaths between deep breaths. The spirometer may include an indicator to show your best effort. Use the indicator as a goal to work toward during each repetition. After each set of 10 deep breaths, practice coughing to be sure your lungs are clear. If you have an incision (the cut made at the time of surgery), support your incision when coughing by placing a pillow or rolled up towels firmly against it. Once you are able to get out of bed, walk around indoors and cough well. You may stop using the incentive spirometer when instructed by your caregiver.  RISKS AND COMPLICATIONS Take your time so you do not get dizzy or light-headed. If you are in pain, you may need to take or ask for pain medication before doing incentive spirometry. It is harder to take a deep breath if you are having pain. AFTER USE Rest and breathe slowly and easily. It can be helpful to keep track of a log of your progress. Your caregiver can provide you with a simple table to help with this. If you are using the spirometer at home, follow these instructions: [place] IF:  You are having difficultly using the spirometer. You have trouble using the spirometer as often as instructed. Your pain medication is not giving enough relief while using the spirometer. You develop fever of 100.5 F (38.1 C) or higher. SEEK IMMEDIATE MEDICAL CARE IF:  You cough up bloody sputum that had not been present before. You develop fever of 102 F (38.9 C) or greater. You develop worsening pain at or near the incision site. MAKE SURE YOU:  Understand these instructions. Will watch your condition. Will get help right away if you are not doing well or get worse. Document Released: 07/09/2006 Document Revised: 05/21/2011 Document Reviewed: 09/09/2006 [employer] Patient Information 2014 [place], [place].   ________________________________________________________________________

## 2021-03-02 ENCOUNTER — Other Ambulatory Visit: Payer: Self-pay

## 2021-03-02 ENCOUNTER — Encounter (HOSPITAL_COMMUNITY)
Admission: RE | Admit: 2021-03-02 | Discharge: 2021-03-02 | Disposition: A | Discharge status: Home / Self Care | Payer: Medicare Other | Source: Ambulatory Visit | Attending: Orthopedic Surgery | Admitting: Orthopedic Surgery

## 2021-03-02 ENCOUNTER — Encounter (HOSPITAL_COMMUNITY): Payer: Self-pay

## 2021-03-02 VITALS — BP 157/76 | HR 80 | Temp 98.0°F | Ht 73.0 in | Wt 231.0 lb

## 2021-03-02 DIAGNOSIS — I10 Essential (primary) hypertension: Secondary | ICD-10-CM | POA: Diagnosis not present

## 2021-03-02 DIAGNOSIS — J449 Chronic obstructive pulmonary disease, unspecified: Secondary | ICD-10-CM | POA: Insufficient documentation

## 2021-03-02 DIAGNOSIS — Z79899 Other long term (current) drug therapy: Secondary | ICD-10-CM | POA: Insufficient documentation

## 2021-03-02 DIAGNOSIS — Z01818 Encounter for other preprocedural examination: Secondary | ICD-10-CM

## 2021-03-02 DIAGNOSIS — Z01812 Encounter for preprocedural laboratory examination: Secondary | ICD-10-CM | POA: Insufficient documentation

## 2021-03-02 DIAGNOSIS — Z87891 Personal history of nicotine dependence: Secondary | ICD-10-CM | POA: Insufficient documentation

## 2021-03-02 DIAGNOSIS — I480 Paroxysmal atrial fibrillation: Secondary | ICD-10-CM | POA: Insufficient documentation

## 2021-03-02 DIAGNOSIS — M1612 Unilateral primary osteoarthritis, left hip: Secondary | ICD-10-CM

## 2021-03-02 HISTORY — DX: Unspecified osteoarthritis, unspecified site: M19.90

## 2021-03-02 HISTORY — DX: Cardiac arrhythmia, unspecified: I49.9

## 2021-03-02 HISTORY — DX: Cardiac murmur, unspecified: R01.1

## 2021-03-02 HISTORY — DX: Malignant (primary) neoplasm, unspecified: C80.1

## 2021-03-02 LAB — COMPREHENSIVE METABOLIC PANEL
ALT: 33 U/L (ref 0–44)
AST: 23 U/L (ref 15–41)
Albumin: 4.1 g/dL (ref 3.5–5.0)
Alkaline Phosphatase: 72 U/L (ref 38–126)
Anion gap: 7 (ref 5–15)
BUN: 16 mg/dL (ref 8–23)
CO2: 29 mmol/L (ref 22–32)
Calcium: 8.9 mg/dL (ref 8.9–10.3)
Chloride: 102 mmol/L (ref 98–111)
Creatinine, Ser: 0.74 mg/dL (ref 0.61–1.24)
GFR, Estimated: >60 mL/min (ref >60)
Glucose, Bld: 150 mg/dL — ABNORMAL HIGH (ref 70–99)
Potassium: 3.9 mmol/L (ref 3.5–5.1)
Sodium: 138 mmol/L (ref 135–145)
Total Bilirubin: 2.5 mg/dL — ABNORMAL HIGH (ref 0.3–1.2)
Total Protein: 7.2 g/dL (ref 6.5–8.1)

## 2021-03-02 LAB — SURGICAL PCR SCREEN
MRSA, PCR: NEGATIVE
Staphylococcus aureus: NEGATIVE

## 2021-03-02 LAB — PROTIME-INR
INR: 1.1 (ref 0.8–1.2)
Prothrombin Time: 14.5 seconds (ref 11.4–15.2)

## 2021-03-02 LAB — CBC
HCT: 33.5 % — ABNORMAL LOW (ref 39.0–52.0)
Hemoglobin: 12.1 g/dL — ABNORMAL LOW (ref 13.0–17.0)
MCH: 34.9 pg — ABNORMAL HIGH (ref 26.0–34.0)
MCHC: 36.1 g/dL — ABNORMAL HIGH (ref 30.0–36.0)
MCV: 96.5 fL (ref 80.0–100.0)
Platelets: 210 10*3/uL (ref 150–400)
RBC: 3.47 MIL/uL — ABNORMAL LOW (ref 4.22–5.81)
RDW: 14.5 % (ref 11.5–15.5)
WBC: 5.7 10*3/uL (ref 4.0–10.5)
nRBC: 0 % (ref 0.0–0.2)

## 2021-03-02 LAB — GLUCOSE, CAPILLARY: Glucose-Capillary: 184 mg/dL — ABNORMAL HIGH (ref 70–99)

## 2021-03-02 NOTE — Progress Notes (Signed)
COVID Vaccine Completed: Yes Date COVID Vaccine completed: 12/18/19 x 3 COVID vaccine manufacturer: Pfizer    COVID Test: 03/15/21 PCP - Dr. Dimas Chyle Cardiologist - Dr. Buford Dresser. Clearance: Bhagat Bhavinkumar: PA: 01/20/21: Epic,Chart  Chest x-ray -  EKG - 10/19/20 Stress Test -  ECHO - 09/21/20 Cardiac Cath -  Pacemaker/ICD device last checked:  Sleep Study - Yes CPAP - Yes  Fasting Blood Sugar - N/A Checks Blood Sugar ___0__ times a day  Blood Thinner Instructions:Xarelto will be held 3 days before surgery as per cardiologist instructions. Aspirin Instructions: Last Dose:  Anesthesia review: Hx: COPD,HTN,OSA(CPAP),Afib,Pre-DIA.  Patient denies shortness of breath, fever, cough and chest pain at PAT appointment   Patient verbalized understanding of instructions that were given to them at the PAT appointment. Patient was also instructed that they will need to review over the PAT instructions again at home before surgery.

## 2021-03-02 NOTE — H&P (Signed)
TOTAL HIP ADMISSION H&P  Patient is admitted for left total hip arthroplasty.  Subjective:  Chief Complaint: Left hip pain  HPI: Steven Shepard, 75 y.o. male, has a history of pain and functional disability in the left hip due to arthritis and patient has failed non-surgical conservative treatments for greater than 12 weeks to include NSAID's and/or analgesics, flexibility and strengthening excercises, and activity modification. Onset of symptoms was gradual, starting  several  years ago with gradually worsening course since that time. The patient noted no past surgery on the left hip. Patient currently rates pain in the left hip at 7 out of 10 with activity. Patient has night pain, pain that interfers with activities of daily living, pain with passive range of motion, and crepitus. Patient has evidence of periarticular osteophytes and joint space narrowing by imaging studies. This condition presents safety issues increasing the risk of falls. There is no current active infection.  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[place]) 03/23/2020   Prediabetes 01/15/2019   Moderate aortic stenosis 01/14/2019   OSA on CPAP    NASH (nonalcoholic steatohepatitis) 04/26/2016   Osteoarthrosis involving lower leg 05/21/2015   BPH (benign prostatic hyperplasia) 01/11/2014   Long term current use of anticoagulant 06/20/2010   Paroxysmal atrial fibrillation ([place]) 06/09/2010   Atherosclerosis of native arteries of extremity with rest pain ([place]) 02/14/2010   Spinal stenosis of lumbar region with neurogenic claudication 02/14/2010   Chronic obstructive pulmonary disease ([place]) 08/10/2009   Obstructive sleep apnea syndrome, on CPAP 03/02/2008   Hyperlipidemia 03/01/2008   Essential hypertension 03/01/2008    Past Medical History:  Diagnosis Date   Anemia, hemolytic  ([place]) 01/22/2011   Aortic stenosis    Arthritis    Cancer (HCC)    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[place]) 01/23/2011   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 Maureen Ralphs    Prior to Admission medications   Medication Sig Start Date End Date Taking? Authorizing Provider  acetaminophen (TYLENOL) 650 MG CR tablet Take 650 mg by mouth 2 (two) times daily as needed for pain.   Yes [provider]  amLODipine (NORVASC) 10 MG tablet Take 1 tablet (10 mg total) by mouth daily. Patient taking differently: Take 10 mg by mouth every evening. 12/09/20 06/07/21 Yes Loel Dubonnet, NP  amoxicillin (AMOXIL) 500 MG capsule amoxicillin 500 mg capsule  TK FOUR CS PO 1 HOUR B DAPP   Yes [provider]  atorvastatin (LIPITOR) 20 MG tablet TAKE 1 TABLET(20 MG) BY MOUTH DAILY Patient taking differently: Take 20 mg by mouth every evening. 01/18/21  Yes Vivi Barrack, MD  carvedilol (COREG) 12.5 MG tablet Take 1 tablet (12.5 mg total) by mouth 2 (two) times daily. 10/19/20 10/14/21 Yes Buford Dresser, MD  Fexofenadine HCl (ALLEGRA PO) Take 180 mg by mouth every morning.   Yes [provider]  folic acid (FOLVITE) 614 MCG tablet Take 800 mcg by mouth 2 (two) times daily.   Yes [provider]  furosemide (LASIX) 40 MG tablet TAKE 1 TABLET BY MOUTH TWICE DAILY AT 8 AM AND AT 2 PM 01/11/21  Yes Vivi Barrack, MD  metFORMIN (GLUCOPHAGE) 500 MG tablet Take 1 tablet (500 mg total) by mouth daily with breakfast. 07/13/20  Yes Orma Flaming, MD  Multiple Vitamin (MULTIVITAMIN) capsule Take 1 capsule by mouth daily.   Yes [provider]  Multiple Vitamins-Minerals (EMERGEN-C IMMUNE PO) Take 1 tablet by mouth daily.    Yes [provider]  Omega-3 Fatty Acids (OMEGA 3 500) 500 MG CAPS Take 500 capsules by mouth daily.   Yes [provider]  potassium chloride SA (KLOR-CON) 20 MEQ tablet Take 1 tablet (20 mEq total) by mouth 2 (two) times daily. 11/16/20  Yes Buford Dresser, MD  Psyllium (METAMUCIL FIBER PO) Take 1 Dose by mouth daily.    Yes [provider]  rivaroxaban (XARELTO) 20 MG TABS tablet Take 1 tablet (20 mg total) by mouth daily with supper. 10/19/20  Yes Buford Dresser, MD  tamsulosin (FLOMAX) 0.4 MG CAPS capsule Take 1 capsule (0.4 mg total) by mouth daily. Patient taking differently: Take 0.4 mg by mouth daily after supper. 01/18/21  Yes Vivi Barrack, MD    Allergies  Allergen Reactions   Ace Inhibitors Other (See Comments)    Other reaction(s): High K+    Codeine Other (See Comments)    Other reaction(s):Hallucinations   Spironolactone Other (See Comments)    Increases potassium    Social History   Socioeconomic History   Marital status: Married    Spouse name: Not on file   Number of children: Not on file   Years of education: Not on file   Highest education level: Not on file  Occupational History   Occupation: Architectural technologist: [employer]    Comment: retired  Tobacco Use   Smoking status: Former    Packs/day: 1.50    Years: 3.00    Pack years: 4.50    Types: Cigarettes, Pipe    Quit date: 03/12/1973    Years since quitting: 48.0   Smokeless tobacco: Never   Tobacco comments:    quit appox 36 years ago  Vaping Use   Vaping Use: Never used  Substance and Sexual Activity   Alcohol use: Yes    Comment:  occasionally, 1-7 drinks per week   Drug use: No   Sexual activity: Yes  Other Topics Concern   Not on file  Social History Narrative   Not on file   Social Determinants of Health   Financial Resource Strain: Low Risk    Difficulty of Paying Living Expenses: Not hard at all  Food Insecurity: No Food Insecurity   Worried About Charity fundraiser in the Last Year: Never true   Ran Out of Food in the Last Year: Never true  Transportation Needs: No Transportation Needs   Lack of Transportation (Medical): No   Lack of Transportation (Non-Medical): No  Physical Activity: Sufficiently Active   Days of Exercise per Week: 7 days   Minutes of  Exercise per Session: 50 min  Stress: No Stress Concern Present   Feeling of Stress : Not at all  Social Connections: Socially Integrated   Frequency of Communication with Friends and Family: More than three times a week   Frequency of Social Gatherings with Friends and Family: More than three times a week   Attends Religious Services: More than 4 times per year   Active Member of [employer] or Organizations: Yes   Attends Archivist Meetings: 1 to 4 times per year   Marital Status: Married  Human resources officer Violence: Not At Risk   Fear of Current or Ex-Partner: No   Emotionally Abused: No   Physically Abused: No   Sexually Abused: No    Tobacco Use: Medium Risk   Smoking Tobacco Use: Former   Smokeless Tobacco Use: Never   Passive Exposure: Not on file   Social History   Substance and Sexual Activity  Alcohol Use Yes   Comment: occasionally, 1-7 drinks per week    Family History  Problem Relation Age of Onset   Coronary artery disease Mother    Hypertension Mother    Heart attack Mother    Coronary artery disease Father    Hypertension Father    Heart attack Father    Diabetes Sister    Hypertension Sister    Diabetes Brother    Cancer Brother    Colon cancer Neg Hx    Colonic polyp Neg Hx     ROS: Constitutional:  no fever, no chills, no night sweats, no significant weight loss Cardiovascular: no chest pain, no palpitations Respiratory: no cough, no shortness of breath, No COPD Gastrointestinal: no vomiting, no nausea Musculoskeletal: no swelling in Joints, Joint Pain Neurologic: no numbness, no tingling, no difficulty with balance    Objective:  Physical Exam: Well nourished and well developed.  General: Alert and oriented x3, cooperative and pleasant, no acute distress.  Head: normocephalic, atraumatic, neck supple.  Eyes: EOMI.  Respiratory: breath sounds clear in all fields, no wheezing, rales, or rhonchi. Cardiovascular: Regular rate and rhythm, no murmurs, gallops or rubs.  Abdomen: non-tender to palpation and soft, normoactive bowel sounds. Musculoskeletal:  The patient has a minimally antalgic gait pattern favoring the left side without the use of assistive devices.     Right Hip Exam:   The range of motion: Flexion to 120 degrees, Internal Rotation to 30 degrees, External Rotation to 40 degrees, and abduction to 40 degrees without discomfort.     Left Hip Exam:   The range of motion: Flexion to 100 degrees, Internal Rotation to 0 degrees, External Rotation to 20 degrees, and abduction to 20 degrees without discomfort.   There is no tenderness over the greater trochanteric bursa   There is no pain on provocative testing of the hip.   Calves soft and nontender. Motor function intact in LE. Strength 5/5 LE bilaterally. Neuro: Distal pulses 2+. Sensation to light touch intact in LE.  Vital signs in last 24 hours: Temp:  [98 F (36.7 C)] 98 F (36.7 C) (12/22 0939) Pulse Rate:  [80] 80 (12/22 0939) BP: (157)/(76) 157/76 (12/22 0939) SpO2:  [98 %] 98 % (12/22 0939) Weight:  [104.8 kg] 104.8 kg (12/22 0939)  Imaging Review AP pelvis, AP and lateral of the left hip dated 12/2020 demonstrate bone-on-bone arthritis in the left hip with large osteophyte formation. His prosthesis on  the right is in a good position with no periprosthetic abnormalities.  Assessment/Plan:  End  stage arthritis, left hip  The patient history, physical examination, clinical judgement of the provider and imaging studies are consistent with end stage degenerative joint disease of the left hip and total hip arthroplasty is deemed medically necessary. The treatment options including medical management, injection therapy, arthroscopy and arthroplasty were discussed at length. The risks and benefits of total hip arthroplasty were presented and reviewed. The risks due to aseptic loosening, infection, stiffness, dislocation/subluxation, thromboembolic complications and other imponderables were discussed. The patient acknowledged the explanation, agreed to proceed with the plan and consent was signed. Patient is being admitted for inpatient treatment for surgery, pain control, PT, OT, prophylactic antibiotics, VTE prophylaxis, progressive ambulation and ADLs and discharge planning.The patient is planning to be discharged  home .   Patient's anticipated LOS is less than 2 midnights, meeting these requirements: - Younger than 64 - Lives within 1 hour of care - Has a competent adult at home to recover with post-op recover - NO history of  - Chronic pain requiring opiods  - Diabetes  - Coronary Artery Disease  - Heart failure  - Heart attack  - Stroke  - DVT/VTE  - Cardiac arrhythmia  - Respiratory Failure/COPD  - Renal failure  - Anemia  - Advanced Liver disease    Therapy Plans: HEP Disposition: Home with Wife Planned DVT Prophylaxis: Xarelto 46m (Patient already taking due to atrial fibrillation) DME Needed: None PCP: CDimas ChyleCardilogist: Bridgett Christopher (clearance received) TXA: IV Allergies: ACE-I, Codeine, Spironolactone, Hallucinations with oxycontin Anesthesia Concerns: On CPAP machine BMI: 29.1 Last HgbA1c: 5.4  Pharmacy: [facility] on [address]  - Patient was  instructed on what medications to stop prior to surgery. - Follow-up visit in 2 weeks with Dr. AWynelle Link- Begin physical therapy following surgery - Pre-operative lab work as pre-surgical testing - Prescriptions will be provided in hospital at time of discharge  SFenton Foy MPrinc[facility] PA-C Orthopedic Surgery [facility] [place]

## 2021-03-03 NOTE — Anesthesia Preprocedure Evaluation (Addendum)
Anesthesia Evaluation  Patient identified by MRN, date of birth, ID band Patient awake    Reviewed: Allergy & Precautions, NPO status , Patient's Chart, lab work & pertinent test results  Airway Mallampati: II  TM Distance: >3 FB Neck ROM: Full    Dental no notable dental hx. (+) Teeth Intact, Dental Advisory Given   Pulmonary sleep apnea and Continuous Positive Airway Pressure Ventilation , COPD, former smoker,    Pulmonary exam normal breath sounds clear to auscultation       Cardiovascular hypertension, Pt. on medications + Peripheral Vascular Disease  Normal cardiovascular exam+ dysrhythmias Atrial Fibrillation + Valvular Problems/Murmurs AS  Rhythm:Regular Rate:Normal  10/09/2020 TTE 1. Left ventricular ejection fraction, by estimation, is 60 to 65%. The  left ventricle has normal function. The left ventricle has no regional  wall motion abnormalities. There is mild concentric left ventricular  hypertrophy. Left ventricular diastolic  parameters are consistent with Grade I diastolic dysfunction (impaired  relaxation).  2. Right ventricular systolic function is normal. The right ventricular  size is normal. Tricuspid regurgitation signal is inadequate for assessing  PA pressure.  3. Left atrial size was mildly dilated.  4. The mitral valve is normal in structure. No evidence of mitral valve  regurgitation. No evidence of mitral stenosis.  5. The aortic valve is calcified. There is moderate calcification of the  aortic valve. There is moderate thickening of the aortic valve. Aortic  valve regurgitation is not visualized. Moderate aortic valve stenosis.  Aortic valve area, by VTI measures  1.16 cm. Aortic valve mean gradient measures 20.0 mmHg. Aortic valve Vmax  measures 3.18 m/s.  6. Aortic dilatation noted. There is mild dilatation of the aortic root,  measuring 43 mm. There is mild dilatation of the ascending aorta,   measuring 40 mm.  7. The inferior vena cava is dilated in size with <50% respiratory  variability, suggesting right atrial pressure of 15 mmHg.   Comparison(s): Compared to prior TTE in 09/2019, there is stable moderate  AS (previous mean gradient 64mHg; now 23mg). The ascending aorta  remains stable in size at 4074m   Neuro/Psych negative neurological ROS  negative psych ROS   GI/Hepatic negative GI ROS, Neg liver ROS,   Endo/Other  negative endocrine ROS  Renal/GU Lab Results      Component                Value               Date                      CREATININE               0.87                03/08/2021                BUN                      17                  03/08/2021                NA                       138                 03/08/2021  K                        4.4                 03/08/2021                CL                       102                 03/08/2021                CO2                      32                  03/08/2021                Musculoskeletal  (+) Arthritis , Osteoarthritis,    Abdominal   Peds  Hematology Lab Results      Component                Value               Date                      WBC                      7.7                 03/08/2021                HGB                      11.9 (L)            03/08/2021                HCT                      34.0 (L)            03/08/2021                MCV                      97.1                03/08/2021                PLT                      210                 03/08/2021              Anesthesia Other Findings All: ACE, Codeine, Spironolactone  Reproductive/Obstetrics                           Anesthesia Physical Anesthesia Plan  ASA: 3  Anesthesia Plan: Spinal   Post-op Pain Management:    Induction:   PONV Risk Score and Plan: Treatment may vary due to age or medical condition, Ondansetron, Dexamethasone and Midazolam  Airway  Management Planned: Natural Airway and Simple Face Mask  Additional Equipment: None  Intra-op  Plan:   Post-operative Plan:   Informed Consent: I have reviewed the patients History and Physical, chart, labs and discussed the procedure including the risks, benefits and alternatives for the proposed anesthesia with the patient or authorized representative who has indicated his/her understanding and acceptance.     Dental advisory given  Plan Discussed with: CRNA and Anesthesiologist  Anesthesia Plan Comments: (See PAT note 03/02/21, Konrad Felix Ward, PA-C  Last Xarelto Saturday )       Anesthesia Quick Evaluation

## 2021-03-03 NOTE — Progress Notes (Signed)
Anesthesia Chart Review   Case: [id number] Date/Time: 03/15/21 1315   Procedure: TOTAL HIP ARTHROPLASTY ANTERIOR APPROACH (Left: Hip)   Anesthesia type: Choice   Pre-op diagnosis: left hip osteoarthritis   Location: [facility] ROOM 09 / [facility]   Surgeons: Gaynelle Arabian, MD       DISCUSSION:75 y.o. former smoker with h/o HTN, COPD, PAF (Xarelto), moderate AS (valve area 1.16 cm2, mean gradient 20.0 mmHg), cold agglutinin disease, left hip OA scheduled for above procedure 03/15/2021 with Dr. Gaynelle Arabian.   Per cardiology preoperative evaluation 01/20/21, "Chart reviewed as part of pre-operative protocol coverage. Given past medical history and time since last visit, based on ACC/AHA guidelines, Waqas Bruhl would be at acceptable risk for the planned procedure without further cardiovascular testing. "  Pt advised by pharmacy to hold Xarelto 3 days prior to procedure.   Anticipate pt can proceed with planned procedure barring acute status change.   VS: BP (!) 157/76    Pulse 80    Temp 36.7 C (Oral)    Ht 6' 1"  (1.854 m)    Wt 104.8 kg    SpO2 98%    BMI 30.48 kg/m   PROVIDERS: Vivi Barrack, MD is PCP   Buford Dresser, MD is Cardiologist  LABS: Labs reviewed: Acceptable for surgery. (all labs ordered are listed, but only abnormal results are displayed)  Labs Reviewed  CBC - Abnormal; Notable for the following components:      Result Value   RBC 3.47 (*)    Hemoglobin 12.1 (*)    HCT 33.5 (*)    MCH 34.9 (*)    MCHC 36.1 (*)    All other components within normal limits  COMPREHENSIVE METABOLIC PANEL - Abnormal; Notable for the following components:   Glucose, Bld 150 (*)    Total Bilirubin 2.5 (*)    All other components within normal limits  GLUCOSE, CAPILLARY - Abnormal; Notable for the following components:   Glucose-Capillary 184 (*)    All other components within normal limits  SURGICAL PCR SCREEN  PROTIME-INR  TYPE AND SCREEN      IMAGES:   EKG: 10/19/2020 Rate 55 bpm  Sinus bradycardia with 1st degree AV block   CV: Echo 09/21/2020 1. Left ventricular ejection fraction, by estimation, is 60 to 65%. The  left ventricle has normal function. The left ventricle has no regional  wall motion abnormalities. There is mild concentric left ventricular  hypertrophy. Left ventricular diastolic  parameters are consistent with Grade I diastolic dysfunction (impaired  relaxation).   2. Right ventricular systolic function is normal. The right ventricular  size is normal. Tricuspid regurgitation signal is inadequate for assessing  PA pressure.   3. Left atrial size was mildly dilated.   4. The mitral valve is normal in structure. No evidence of mitral valve  regurgitation. No evidence of mitral stenosis.   5. The aortic valve is calcified. There is moderate calcification of the  aortic valve. There is moderate thickening of the aortic valve. Aortic  valve regurgitation is not visualized. Moderate aortic valve stenosis.  Aortic valve area, by VTI measures  1.16 cm. Aortic valve mean gradient measures 20.0 mmHg. Aortic valve Vmax  measures 3.18 m/s.   6. Aortic dilatation noted. There is mild dilatation of the aortic root,  measuring 43 mm. There is mild dilatation of the ascending aorta,  measuring 40 mm.   7. The inferior vena cava is dilated in size with <50% respiratory  variability, suggesting  right atrial pressure of 15 mmHg.  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[place]) 03/23/2020   COPD (chronic obstructive pulmonary disease) (HCC)    DOE (dyspnea on exertion)    Dysrhythmia    Elevated bilirubin    Fatty liver    Heart murmur    History of neck surgery    Hyperlipidemia    Hypertension    Mild dilation of ascending aorta (HCC)    OSA on CPAP     PAF (paroxysmal atrial fibrillation) ([place])    Pre-diabetes    Umbilical hernia     Past Surgical History:  Procedure Laterality Date   ADENOIDECTOMY     HIATAL HERNIA REPAIR  2011   NECK SURGERY  2005   SKIN SURGERY     skin cancer   TONSILLECTOMY     TOTAL HIP ARTHROPLASTY Right 09/18/2010   Dr. Maureen Ralphs    MEDICATIONS:  acetaminophen (TYLENOL) 650 MG CR tablet   amLODipine (NORVASC) 10 MG tablet   amoxicillin (AMOXIL) 500 MG capsule   atorvastatin (LIPITOR) 20 MG tablet   carvedilol (COREG) 12.5 MG tablet   Fexofenadine HCl (ALLEGRA PO)   folic acid (FOLVITE) 010 MCG tablet   furosemide (LASIX) 40 MG tablet   metFORMIN (GLUCOPHAGE) 500 MG tablet   Multiple Vitamin (MULTIVITAMIN) capsule   Multiple Vitamins-Minerals (EMERGEN-C IMMUNE PO)   Omega-3 Fatty Acids (OMEGA 3 500) 500 MG CAPS   potassium chloride SA (KLOR-CON) 20 MEQ tablet   Psyllium (METAMUCIL FIBER PO)   rivaroxaban (XARELTO) 20 MG TABS tablet   tamsulosin (FLOMAX) 0.4 MG CAPS capsule   No current facility-administered medications for this encounter.    Konrad Felix Ward, PA-C [facility] Pre-Surgical Testing [phone]

## 2021-03-08 ENCOUNTER — Telehealth: Payer: Self-pay | Admitting: *Deleted

## 2021-03-08 ENCOUNTER — Inpatient Hospital Stay: Payer: Medicare Other | Attending: Hematology & Oncology

## 2021-03-08 ENCOUNTER — Other Ambulatory Visit: Payer: Self-pay

## 2021-03-08 ENCOUNTER — Inpatient Hospital Stay (HOSPITAL_BASED_OUTPATIENT_CLINIC_OR_DEPARTMENT_OTHER): Payer: Medicare Other | Admitting: Family

## 2021-03-08 ENCOUNTER — Encounter: Payer: Self-pay | Admitting: Family

## 2021-03-08 VITALS — BP 142/60 | HR 65 | Temp 98.1°F | Resp 18 | Wt 233.0 lb

## 2021-03-08 DIAGNOSIS — D5912 Cold autoimmune hemolytic anemia: Secondary | ICD-10-CM | POA: Diagnosis not present

## 2021-03-08 LAB — CMP (CANCER CENTER ONLY)
ALT: 20 U/L (ref 0–44)
AST: 16 U/L (ref 15–41)
Albumin: 4 g/dL (ref 3.5–5.0)
Alkaline Phosphatase: 72 U/L (ref 38–126)
Anion gap: 4 — ABNORMAL LOW (ref 5–15)
BUN: 17 mg/dL (ref 8–23)
CO2: 32 mmol/L (ref 22–32)
Calcium: 9.3 mg/dL (ref 8.9–10.3)
Chloride: 102 mmol/L (ref 98–111)
Creatinine: 0.87 mg/dL (ref 0.61–1.24)
GFR, Estimated: >60 mL/min (ref >60)
Glucose, Bld: 117 mg/dL — ABNORMAL HIGH (ref 70–99)
Potassium: 4.4 mmol/L (ref 3.5–5.1)
Sodium: 138 mmol/L (ref 135–145)
Total Bilirubin: 1.9 mg/dL — ABNORMAL HIGH (ref 0.3–1.2)
Total Protein: 6.5 g/dL (ref 6.5–8.1)

## 2021-03-08 LAB — CBC WITH DIFFERENTIAL (CANCER CENTER ONLY)
Abs Immature Granulocytes: 0.04 10*3/uL (ref 0.00–0.07)
Basophils Absolute: 0.1 10*3/uL (ref 0.0–0.1)
Basophils Relative: 1 %
Eosinophils Absolute: 0.1 10*3/uL (ref 0.0–0.5)
Eosinophils Relative: 1 %
HCT: 34 % — ABNORMAL LOW (ref 39.0–52.0)
Hemoglobin: 11.9 g/dL — ABNORMAL LOW (ref 13.0–17.0)
Immature Granulocytes: 1 %
Lymphocytes Relative: 31 %
Lymphs Abs: 2.4 10*3/uL (ref 0.7–4.0)
MCH: 32.9 pg (ref 26.0–34.0)
MCHC: 33.8 g/dL (ref 30.0–36.0)
MCV: 97.1 fL (ref 80.0–100.0)
Monocytes Absolute: 0.6 10*3/uL (ref 0.1–1.0)
Monocytes Relative: 8 %
Neutro Abs: 4.5 10*3/uL (ref 1.7–7.7)
Neutrophils Relative %: 58 %
Platelet Count: 210 10*3/uL (ref 150–400)
RBC: 3.5 MIL/uL — ABNORMAL LOW (ref 4.22–5.81)
RDW: 13.8 % (ref 11.5–15.5)
WBC Count: 7.7 10*3/uL (ref 4.0–10.5)
nRBC: 0 % (ref 0.0–0.2)

## 2021-03-08 LAB — LACTATE DEHYDROGENASE: LDH: 145 U/L (ref 98–192)

## 2021-03-08 LAB — SAMPLE TO BLOOD BANK

## 2021-03-08 NOTE — Telephone Encounter (Signed)
Per 03/08/21 los - gave upcoming appointments - confirmed

## 2021-03-08 NOTE — Progress Notes (Signed)
Hematology and Oncology Follow Up Visit  Steven Shepard [id number] [birth year] 75 y.o. 03/08/2021   Principle Diagnosis:  Cold agglutinin disease   Current Therapy:        Observation   Interim History:  Steven Shepard is here today for follow-up. He is doing fairly well but has some left hip pain and also thinks he sprained his right knee but isn't sure how.  He is scheduled for total hip replacement on 03/15/2021. He had his pre-op testing last week.  He has noted discoloration in his hands and face/nose with the cold weather and is staying bundled up especially when outside.  In July, Cold agglutinin 1:4096.  He has chronic swelling in both lower extremities which is felt to be due in part to his taking Norvasc. It is a little more prominent in the left ankle.  No fever, chills, n/v, cough, rash, dizziness, SOB, chest pain, palpitations, abdominal pain or changes in bowel or bladder habits.  No falls or syncope to report.  No blood loss noted. No abnormal bruising, no petechiae.  He has a good appetite and is staying well hydrated. His weight is stable at 233 lbs.   ECOG Performance Status: 1 - Symptomatic but completely ambulatory  Medications:  Allergies as of 03/08/2021       Reactions   Ace Inhibitors Other (See Comments)   Other reaction(s): High K+   Codeine Other (See Comments)   Other reaction(s):Hallucinations   Spironolactone Other (See Comments)   Increases potassium        Medication List        Accurate as of March 08, 2021  1:54 PM. If you have any questions, ask your nurse or doctor.          acetaminophen 650 MG CR tablet Commonly known as: TYLENOL Take 650 mg by mouth 2 (two) times daily as needed for pain.   ALLEGRA PO Take 180 mg by mouth every morning.   amLODipine 10 MG tablet Commonly known as: NORVASC Take 1 tablet (10 mg total) by mouth daily. What changed: when to take this   amoxicillin 500 MG capsule Commonly known as:  AMOXIL amoxicillin 500 mg capsule  TK FOUR CS PO 1 HOUR B DAPP   atorvastatin 20 MG tablet Commonly known as: LIPITOR TAKE 1 TABLET(20 MG) BY MOUTH DAILY What changed:  how much to take how to take this when to take this additional instructions   carvedilol 12.5 MG tablet Commonly known as: COREG Take 1 tablet (12.5 mg total) by mouth 2 (two) times daily.   EMERGEN-C IMMUNE PO Take 1 tablet by mouth daily.   folic acid 937 MCG tablet Commonly known as: FOLVITE Take 800 mcg by mouth 2 (two) times daily.   furosemide 40 MG tablet Commonly known as: LASIX TAKE 1 TABLET BY MOUTH TWICE DAILY AT 8 AM AND AT 2 PM   METAMUCIL FIBER PO Take 1 Dose by mouth daily.   metFORMIN 500 MG tablet Commonly known as: GLUCOPHAGE Take 1 tablet (500 mg total) by mouth daily with breakfast.   multivitamin capsule Take 1 capsule by mouth daily.   Omega 3 500 500 MG Caps Take 500 capsules by mouth daily.   potassium chloride SA 20 MEQ tablet Commonly known as: KLOR-CON M Take 1 tablet (20 mEq total) by mouth 2 (two) times daily.   rivaroxaban 20 MG Tabs tablet Commonly known as: XARELTO Take 1 tablet (20 mg total) by mouth daily with supper.  tamsulosin 0.4 MG Caps capsule Commonly known as: FLOMAX Take 1 capsule (0.4 mg total) by mouth daily. What changed: when to take this        Allergies:  Allergies  Allergen Reactions   Ace Inhibitors Other (See Comments)    Other reaction(s): High K+    Codeine Other (See Comments)    Other reaction(s):Hallucinations   Spironolactone Other (See Comments)    Increases potassium    Past Medical History, Surgical history, Social history, and Family History were reviewed and updated.  Review of Systems: All other 10 point review of systems is negative.   Physical Exam:  weight is 233 lb (105.7 kg). His oral temperature is 98.1 F (36.7 C). His blood pressure is 142/60 (abnormal) and his pulse is 65. His respiration is 18 and  oxygen saturation is 99%.   Wt Readings from Last 3 Encounters:  03/08/21 233 lb (105.7 kg)  03/02/21 231 lb (104.8 kg)  01/18/21 228 lb 9.6 oz (103.7 kg)    Ocular: Sclerae unicteric, pupils equal, round and reactive to light Ear-nose-throat: Oropharynx clear, dentition fair Lymphatic: No cervical or supraclavicular adenopathy Lungs no rales or rhonchi, good excursion bilaterally Heart regular rate and rhythm, no murmur appreciated Abd soft, nontender, positive bowel sounds MSK no focal spinal tenderness, no joint edema Neuro: non-focal, well-oriented, appropriate affect Breasts: Deferred   Lab Results  Component Value Date   WBC 7.7 03/08/2021   HGB 11.9 (L) 03/08/2021   HCT 34.0 (L) 03/08/2021   MCV 97.1 03/08/2021   PLT 210 03/08/2021   No results found for: FERRITIN, IRON, TIBC, UIBC, IRONPCTSAT Lab Results  Component Value Date   RETICCTPCT 1.9 12/25/2013   RBC 3.50 (L) 03/08/2021   RETICCTABS 78.5 12/25/2013   No results found for: Nils Pyle [facility] Lab Results  Component Value Date   IGGSERUM 1,011 08/19/2019   IGA 215 08/19/2019   IGMSERUM 167 (H) 08/19/2019   No results found for: Ronnald Ramp, A1GS, Nelida Meuse, SPEI   Chemistry      Component Value Date/Time   NA 138 03/08/2021 1258   K 4.4 03/08/2021 1258   CL 102 03/08/2021 1258   CO2 32 03/08/2021 1258   BUN 17 03/08/2021 1258   CREATININE 0.87 03/08/2021 1258   CREATININE 0.89 01/13/2020 0906      Component Value Date/Time   CALCIUM 9.3 03/08/2021 1258   ALKPHOS 72 03/08/2021 1258   AST 16 03/08/2021 1258   ALT 20 03/08/2021 1258   BILITOT 1.9 (H) 03/08/2021 1258       Impression and Plan: Steven Shepard is a very pleasant 75 yo caucasian gentleman with cold agglutinin disease. So far, with work up, this appears to be idiopathic.  So far, his Hgb has remained stable at 11.9.  Cold agglutinin titer pending.  Follow-up in 6 months.    Lottie Dawson, NP 12/28/20221:54 PM

## 2021-03-09 LAB — COLD AGGLUTININ TITER: Cold Agglutinin Titer: >1:4096 {titer}

## 2021-03-15 ENCOUNTER — Ambulatory Visit (HOSPITAL_COMMUNITY): Payer: Medicare Other | Admitting: Physician Assistant

## 2021-03-15 ENCOUNTER — Observation Stay (HOSPITAL_COMMUNITY): Payer: Medicare Other

## 2021-03-15 ENCOUNTER — Observation Stay (HOSPITAL_COMMUNITY)
Admission: RE | Admit: 2021-03-15 | Discharge: 2021-03-16 | Disposition: A | Discharge status: Home / Self Care | Payer: Medicare Other | Source: Ambulatory Visit | Attending: Orthopedic Surgery | Admitting: Orthopedic Surgery

## 2021-03-15 ENCOUNTER — Ambulatory Visit (HOSPITAL_COMMUNITY): Payer: Medicare Other | Admitting: Anesthesiology

## 2021-03-15 ENCOUNTER — Ambulatory Visit (HOSPITAL_COMMUNITY): Payer: Medicare Other

## 2021-03-15 ENCOUNTER — Encounter (HOSPITAL_COMMUNITY): Payer: Self-pay | Admitting: Orthopedic Surgery

## 2021-03-15 ENCOUNTER — Other Ambulatory Visit: Payer: Self-pay

## 2021-03-15 ENCOUNTER — Encounter (HOSPITAL_COMMUNITY)
Admission: RE | Disposition: A | Discharge status: Home / Self Care | Payer: Self-pay | Source: Ambulatory Visit | Attending: Orthopedic Surgery

## 2021-03-15 DIAGNOSIS — R7303 Prediabetes: Secondary | ICD-10-CM | POA: Insufficient documentation

## 2021-03-15 DIAGNOSIS — J449 Chronic obstructive pulmonary disease, unspecified: Secondary | ICD-10-CM | POA: Diagnosis not present

## 2021-03-15 DIAGNOSIS — Z85828 Personal history of other malignant neoplasm of skin: Secondary | ICD-10-CM | POA: Insufficient documentation

## 2021-03-15 DIAGNOSIS — M169 Osteoarthritis of hip, unspecified: Secondary | ICD-10-CM | POA: Diagnosis present

## 2021-03-15 DIAGNOSIS — M1612 Unilateral primary osteoarthritis, left hip: Secondary | ICD-10-CM | POA: Diagnosis not present

## 2021-03-15 DIAGNOSIS — Z96642 Presence of left artificial hip joint: Secondary | ICD-10-CM | POA: Diagnosis not present

## 2021-03-15 DIAGNOSIS — Z79899 Other long term (current) drug therapy: Secondary | ICD-10-CM | POA: Diagnosis not present

## 2021-03-15 DIAGNOSIS — Z96649 Presence of unspecified artificial hip joint: Secondary | ICD-10-CM

## 2021-03-15 DIAGNOSIS — Z471 Aftercare following joint replacement surgery: Secondary | ICD-10-CM | POA: Diagnosis not present

## 2021-03-15 DIAGNOSIS — Z20822 Contact with and (suspected) exposure to covid-19: Secondary | ICD-10-CM | POA: Insufficient documentation

## 2021-03-15 DIAGNOSIS — I480 Paroxysmal atrial fibrillation: Secondary | ICD-10-CM | POA: Insufficient documentation

## 2021-03-15 DIAGNOSIS — Z7901 Long term (current) use of anticoagulants: Secondary | ICD-10-CM | POA: Insufficient documentation

## 2021-03-15 DIAGNOSIS — Z9989 Dependence on other enabling machines and devices: Secondary | ICD-10-CM | POA: Diagnosis not present

## 2021-03-15 DIAGNOSIS — G4733 Obstructive sleep apnea (adult) (pediatric): Secondary | ICD-10-CM | POA: Diagnosis not present

## 2021-03-15 DIAGNOSIS — Z87891 Personal history of nicotine dependence: Secondary | ICD-10-CM | POA: Diagnosis not present

## 2021-03-15 DIAGNOSIS — Z7984 Long term (current) use of oral hypoglycemic drugs: Secondary | ICD-10-CM | POA: Insufficient documentation

## 2021-03-15 DIAGNOSIS — I10 Essential (primary) hypertension: Secondary | ICD-10-CM | POA: Insufficient documentation

## 2021-03-15 DIAGNOSIS — Z419 Encounter for procedure for purposes other than remedying health state, unspecified: Secondary | ICD-10-CM

## 2021-03-15 DIAGNOSIS — Z01818 Encounter for other preprocedural examination: Secondary | ICD-10-CM

## 2021-03-15 HISTORY — PX: TOTAL HIP ARTHROPLASTY: SHX124

## 2021-03-15 LAB — BPAM RBC
Blood Product Expiration Date: 202301172359
Blood Product Expiration Date: 202301172359
Unit Type and Rh: 5100
Unit Type and Rh: 5100

## 2021-03-15 LAB — TYPE AND SCREEN
ABO/RH(D): O POS
Antibody Screen: POSITIVE
DAT, IgG: NEGATIVE
Unit division: 0
Unit division: 0

## 2021-03-15 LAB — SARS CORONAVIRUS 2 BY RT PCR (HOSPITAL ORDER, PERFORMED IN ~~LOC~~ HOSPITAL LAB): SARS Coronavirus 2: NEGATIVE

## 2021-03-15 LAB — GLUCOSE, CAPILLARY
Glucose-Capillary: 120 mg/dL — ABNORMAL HIGH (ref 70–99)
Glucose-Capillary: 134 mg/dL — ABNORMAL HIGH (ref 70–99)
Glucose-Capillary: 199 mg/dL — ABNORMAL HIGH (ref 70–99)

## 2021-03-15 SURGERY — ARTHROPLASTY, HIP, TOTAL, ANTERIOR APPROACH
Anesthesia: Spinal | Site: Hip | Laterality: Left

## 2021-03-15 MED ORDER — INSULIN ASPART 100 UNIT/ML IJ SOLN
0.0000 [IU] | Freq: Three times a day (TID) | INTRAMUSCULAR | Status: DC
  Administered 2021-03-15 – 2021-03-16 (×2): 2 [IU] via SUBCUTANEOUS

## 2021-03-15 MED ORDER — PROPOFOL 1000 MG/100ML IV EMUL
INTRAVENOUS | Status: AC
  Filled 2021-03-15: qty 100

## 2021-03-15 MED ORDER — ONDANSETRON HCL 4 MG PO TABS: 4.0000 mg | ORAL_TABLET | Freq: Four times a day (QID) | ORAL | Status: DC | PRN

## 2021-03-15 MED ORDER — INSULIN ASPART 100 UNIT/ML IJ SOLN: 0.0000 [IU] | Freq: Every day | INTRAMUSCULAR | Status: DC

## 2021-03-15 MED ORDER — ONDANSETRON HCL 4 MG/2ML IJ SOLN: 4.0000 mg | Freq: Once | INTRAMUSCULAR | Status: DC | PRN

## 2021-03-15 MED ORDER — HYDROMORPHONE HCL 2 MG PO TABS: 2.0000 mg | ORAL_TABLET | ORAL | Status: DC | PRN

## 2021-03-15 MED ORDER — AMLODIPINE BESYLATE 10 MG PO TABS: 10.0000 mg | ORAL_TABLET | Freq: Every evening | ORAL | Status: DC

## 2021-03-15 MED ORDER — ALBUMIN HUMAN 5 % IV SOLN: INTRAVENOUS | Status: DC | PRN

## 2021-03-15 MED ORDER — DEXAMETHASONE SODIUM PHOSPHATE 10 MG/ML IJ SOLN
8.0000 mg | Freq: Once | INTRAMUSCULAR | Status: AC
  Administered 2021-03-15: 8 mg via INTRAVENOUS

## 2021-03-15 MED ORDER — TAMSULOSIN HCL 0.4 MG PO CAPS
0.4000 mg | ORAL_CAPSULE | Freq: Every day | ORAL | Status: DC
  Administered 2021-03-15: 0.4 mg via ORAL
  Filled 2021-03-15: qty 1

## 2021-03-15 MED ORDER — BUPIVACAINE HCL 0.25 % IJ SOLN
INTRAMUSCULAR | Status: DC | PRN
  Administered 2021-03-15: 30 mL

## 2021-03-15 MED ORDER — TRAMADOL HCL 50 MG PO TABS
50.0000 mg | ORAL_TABLET | Freq: Four times a day (QID) | ORAL | Status: DC | PRN
  Administered 2021-03-15 – 2021-03-16 (×4): 100 mg via ORAL
  Filled 2021-03-15 (×4): qty 2

## 2021-03-15 MED ORDER — ALBUMIN HUMAN 5 % IV SOLN
INTRAVENOUS | Status: AC
  Filled 2021-03-15: qty 250

## 2021-03-15 MED ORDER — METHOCARBAMOL 1000 MG/10ML IJ SOLN
500.0000 mg | Freq: Four times a day (QID) | INTRAVENOUS | Status: DC | PRN
  Filled 2021-03-15: qty 5

## 2021-03-15 MED ORDER — CHLORHEXIDINE GLUCONATE 0.12 % MT SOLN
15.0000 mL | Freq: Once | OROMUCOSAL | Status: AC
  Administered 2021-03-15: 15 mL via OROMUCOSAL

## 2021-03-15 MED ORDER — POLYETHYLENE GLYCOL 3350 17 G PO PACK: 17.0000 g | PACK | Freq: Every day | ORAL | Status: DC | PRN

## 2021-03-15 MED ORDER — LACTATED RINGERS IV SOLN: INTRAVENOUS | Status: DC

## 2021-03-15 MED ORDER — 0.9 % SODIUM CHLORIDE (POUR BTL) OPTIME
TOPICAL | Status: DC | PRN
  Administered 2021-03-15: 1000 mL

## 2021-03-15 MED ORDER — MORPHINE SULFATE (PF) 2 MG/ML IV SOLN: 0.5000 mg | INTRAVENOUS | Status: DC | PRN

## 2021-03-15 MED ORDER — ACETAMINOPHEN 10 MG/ML IV SOLN
INTRAVENOUS | Status: AC
  Filled 2021-03-15: qty 100

## 2021-03-15 MED ORDER — ACETAMINOPHEN 10 MG/ML IV SOLN: 1000.0000 mg | Freq: Once | INTRAVENOUS | Status: DC | PRN

## 2021-03-15 MED ORDER — ORAL CARE MOUTH RINSE: 15.0000 mL | Freq: Once | OROMUCOSAL | Status: AC

## 2021-03-15 MED ORDER — MENTHOL 3 MG MT LOZG: 1.0000 | LOZENGE | OROMUCOSAL | Status: DC | PRN

## 2021-03-15 MED ORDER — ONDANSETRON HCL 4 MG/2ML IJ SOLN
INTRAMUSCULAR | Status: AC
  Filled 2021-03-15: qty 2

## 2021-03-15 MED ORDER — CEFAZOLIN SODIUM-DEXTROSE 2-4 GM/100ML-% IV SOLN
2.0000 g | Freq: Four times a day (QID) | INTRAVENOUS | Status: AC
  Administered 2021-03-15 – 2021-03-16 (×2): 2 g via INTRAVENOUS
  Filled 2021-03-15 (×2): qty 100

## 2021-03-15 MED ORDER — POTASSIUM CHLORIDE CRYS ER 20 MEQ PO TBCR
20.0000 meq | EXTENDED_RELEASE_TABLET | Freq: Two times a day (BID) | ORAL | Status: DC
  Administered 2021-03-15 – 2021-03-16 (×2): 20 meq via ORAL
  Filled 2021-03-15 (×2): qty 1

## 2021-03-15 MED ORDER — MIDAZOLAM HCL 5 MG/5ML IJ SOLN
INTRAMUSCULAR | Status: DC | PRN
  Administered 2021-03-15: 2 mg via INTRAVENOUS

## 2021-03-15 MED ORDER — METOCLOPRAMIDE HCL 5 MG/ML IJ SOLN: 5.0000 mg | Freq: Three times a day (TID) | INTRAMUSCULAR | Status: DC | PRN

## 2021-03-15 MED ORDER — PHENOL 1.4 % MT LIQD: 1.0000 | OROMUCOSAL | Status: DC | PRN

## 2021-03-15 MED ORDER — DOCUSATE SODIUM 100 MG PO CAPS
100.0000 mg | ORAL_CAPSULE | Freq: Two times a day (BID) | ORAL | Status: DC
  Administered 2021-03-15 – 2021-03-16 (×2): 100 mg via ORAL
  Filled 2021-03-15 (×2): qty 1

## 2021-03-15 MED ORDER — METHOCARBAMOL 500 MG PO TABS
500.0000 mg | ORAL_TABLET | Freq: Four times a day (QID) | ORAL | Status: DC | PRN
  Administered 2021-03-15: 500 mg via ORAL
  Filled 2021-03-15: qty 1

## 2021-03-15 MED ORDER — WATER FOR IRRIGATION, STERILE IR SOLN
Status: DC | PRN
  Administered 2021-03-15: 2000 mL

## 2021-03-15 MED ORDER — PROPOFOL 500 MG/50ML IV EMUL
INTRAVENOUS | Status: DC | PRN
  Administered 2021-03-15: 75 ug/kg/min via INTRAVENOUS

## 2021-03-15 MED ORDER — FUROSEMIDE 40 MG PO TABS
40.0000 mg | ORAL_TABLET | Freq: Two times a day (BID) | ORAL | Status: DC
  Administered 2021-03-16: 40 mg via ORAL
  Filled 2021-03-15: qty 1

## 2021-03-15 MED ORDER — PHENYLEPHRINE HCL-NACL 20-0.9 MG/250ML-% IV SOLN
INTRAVENOUS | Status: DC | PRN
  Administered 2021-03-15: 50 ug/min via INTRAVENOUS

## 2021-03-15 MED ORDER — POVIDONE-IODINE 10 % EX SWAB
2.0000 "application " | Freq: Once | CUTANEOUS | Status: AC
  Administered 2021-03-15: 2 via TOPICAL

## 2021-03-15 MED ORDER — ACETAMINOPHEN 325 MG PO TABS: 325.0000 mg | ORAL_TABLET | Freq: Four times a day (QID) | ORAL | Status: DC | PRN

## 2021-03-15 MED ORDER — EPHEDRINE 5 MG/ML INJ
INTRAVENOUS | Status: AC
  Filled 2021-03-15: qty 5

## 2021-03-15 MED ORDER — ONDANSETRON HCL 4 MG/2ML IJ SOLN
INTRAMUSCULAR | Status: DC | PRN
Start: 2021-03-15 — End: 2021-03-15
  Administered 2021-03-15: 4 mg via INTRAVENOUS

## 2021-03-15 MED ORDER — CARVEDILOL 12.5 MG PO TABS
12.5000 mg | ORAL_TABLET | Freq: Two times a day (BID) | ORAL | Status: DC
  Administered 2021-03-15 – 2021-03-16 (×2): 12.5 mg via ORAL
  Filled 2021-03-15 (×2): qty 1

## 2021-03-15 MED ORDER — PHENYLEPHRINE 40 MCG/ML (10ML) SYRINGE FOR IV PUSH (FOR BLOOD PRESSURE SUPPORT)
PREFILLED_SYRINGE | INTRAVENOUS | Status: DC | PRN
  Administered 2021-03-15 (×2): 80 ug via INTRAVENOUS

## 2021-03-15 MED ORDER — PROPOFOL 10 MG/ML IV BOLUS
INTRAVENOUS | Status: DC | PRN
  Administered 2021-03-15: 20 mg via INTRAVENOUS
  Administered 2021-03-15: 2 mg via INTRAVENOUS

## 2021-03-15 MED ORDER — ONDANSETRON HCL 4 MG/2ML IJ SOLN: 4.0000 mg | Freq: Four times a day (QID) | INTRAMUSCULAR | Status: DC | PRN

## 2021-03-15 MED ORDER — TRANEXAMIC ACID-NACL 1000-0.7 MG/100ML-% IV SOLN
1000.0000 mg | INTRAVENOUS | Status: AC
  Administered 2021-03-15: 1000 mg via INTRAVENOUS
  Filled 2021-03-15: qty 100

## 2021-03-15 MED ORDER — MIDAZOLAM HCL 2 MG/2ML IJ SOLN
INTRAMUSCULAR | Status: AC
  Filled 2021-03-15: qty 2

## 2021-03-15 MED ORDER — ATORVASTATIN CALCIUM 20 MG PO TABS: 20.0000 mg | ORAL_TABLET | Freq: Every evening | ORAL | Status: DC

## 2021-03-15 MED ORDER — METOCLOPRAMIDE HCL 5 MG PO TABS: 5.0000 mg | ORAL_TABLET | Freq: Three times a day (TID) | ORAL | Status: DC | PRN

## 2021-03-15 MED ORDER — SODIUM CHLORIDE 0.9 % IV SOLN: INTRAVENOUS | Status: DC

## 2021-03-15 MED ORDER — BUPIVACAINE IN DEXTROSE 0.75-8.25 % IT SOLN
INTRATHECAL | Status: DC | PRN
  Administered 2021-03-15: 2 mL via INTRATHECAL

## 2021-03-15 MED ORDER — BUPIVACAINE HCL (PF) 0.25 % IJ SOLN
INTRAMUSCULAR | Status: AC
  Filled 2021-03-15: qty 30

## 2021-03-15 MED ORDER — RIVAROXABAN 10 MG PO TABS
10.0000 mg | ORAL_TABLET | Freq: Every day | ORAL | Status: DC
  Administered 2021-03-16: 10 mg via ORAL
  Filled 2021-03-15: qty 1

## 2021-03-15 MED ORDER — CEFAZOLIN SODIUM-DEXTROSE 2-4 GM/100ML-% IV SOLN
2.0000 g | INTRAVENOUS | Status: AC
Start: 2021-03-15 — End: 2021-03-15
  Administered 2021-03-15: 2 g via INTRAVENOUS
  Filled 2021-03-15: qty 100

## 2021-03-15 MED ORDER — PHENYLEPHRINE 40 MCG/ML (10ML) SYRINGE FOR IV PUSH (FOR BLOOD PRESSURE SUPPORT)
PREFILLED_SYRINGE | INTRAVENOUS | Status: AC
  Filled 2021-03-15: qty 10

## 2021-03-15 MED ORDER — DEXAMETHASONE SODIUM PHOSPHATE 10 MG/ML IJ SOLN
INTRAMUSCULAR | Status: AC
  Filled 2021-03-15: qty 1

## 2021-03-15 MED ORDER — ACETAMINOPHEN 10 MG/ML IV SOLN
INTRAVENOUS | Status: DC | PRN
  Administered 2021-03-15: 1000 mg via INTRAVENOUS

## 2021-03-15 MED ORDER — BISACODYL 10 MG RE SUPP: 10.0000 mg | Freq: Every day | RECTAL | Status: DC | PRN

## 2021-03-15 MED ORDER — FENTANYL CITRATE PF 50 MCG/ML IJ SOSY: 25.0000 ug | PREFILLED_SYRINGE | INTRAMUSCULAR | Status: DC | PRN

## 2021-03-15 SURGICAL SUPPLY — 46 items
BAG COUNTER SPONGE SURGICOUNT (BAG) IMPLANT
BAG DECANTER FOR FLEXI CONT (MISCELLANEOUS) IMPLANT
BAG SPEC THK2 15X12 ZIP CLS (MISCELLANEOUS)
BAG SPNG CNTER NS LX DISP (BAG)
BAG ZIPLOCK 12X15 (MISCELLANEOUS) IMPLANT
BLADE SAG 18X100X1.27 (BLADE) ×2 IMPLANT
CLSR STERI-STRIP ANTIMIC 1/2X4 (GAUZE/BANDAGES/DRESSINGS) ×1 IMPLANT
COVER PERINEAL POST (MISCELLANEOUS) ×2 IMPLANT
COVER SURGICAL LIGHT HANDLE (MISCELLANEOUS) ×2 IMPLANT
CUP ACET PINNACLE SECTR 56MM (Hips) IMPLANT
DECANTER SPIKE VIAL GLASS SM (MISCELLANEOUS) ×2 IMPLANT
DRAPE FOOT SWITCH (DRAPES) ×2 IMPLANT
DRAPE STERI IOBAN 125X83 (DRAPES) ×2 IMPLANT
DRAPE U-SHAPE 47X51 STRL (DRAPES) ×4 IMPLANT
DRSG AQUACEL AG ADV 3.5X10 (GAUZE/BANDAGES/DRESSINGS) ×2 IMPLANT
DURAPREP 26ML APPLICATOR (WOUND CARE) ×2 IMPLANT
ELECT REM PT RETURN 15FT ADLT (MISCELLANEOUS) ×2 IMPLANT
GLOVE SRG 8 PF TXTR STRL LF DI (GLOVE) ×1 IMPLANT
GLOVE SURG ENC MOIS LTX SZ6.5 (GLOVE) ×2 IMPLANT
GLOVE SURG ENC MOIS LTX SZ7 (GLOVE) ×2 IMPLANT
GLOVE SURG ENC MOIS LTX SZ8 (GLOVE) ×4 IMPLANT
GLOVE SURG UNDER POLY LF SZ7 (GLOVE) ×2 IMPLANT
GLOVE SURG UNDER POLY LF SZ8 (GLOVE) ×2
GLOVE SURG UNDER POLY LF SZ8.5 (GLOVE) IMPLANT
GOWN STRL REUS W/TWL LRG LVL3 (GOWN DISPOSABLE) ×4 IMPLANT
GOWN STRL REUS W/TWL XL LVL3 (GOWN DISPOSABLE) IMPLANT
HEAD CERAMIC 36 PLUS5 (Hips) ×1 IMPLANT
HOLDER FOLEY CATH W/STRAP (MISCELLANEOUS) ×2 IMPLANT
KIT TURNOVER KIT A (KITS) IMPLANT
MANIFOLD NEPTUNE II (INSTRUMENTS) ×2 IMPLANT
PACK ANTERIOR HIP CUSTOM (KITS) ×2 IMPLANT
PENCIL SMOKE EVACUATOR COATED (MISCELLANEOUS) ×2 IMPLANT
PINNACLE ALTRX PLUS 4 N 36X56 (Hips) ×1 IMPLANT
PINNACLE SECTOR CUP 56MM (Hips) ×2 IMPLANT
SPONGE T-LAP 18X18 ~~LOC~~+RFID (SPONGE) ×6 IMPLANT
STEM FEMORAL SZ5 HIGH ACTIS (Stem) ×1 IMPLANT
STRIP CLOSURE SKIN 1/2X4 (GAUZE/BANDAGES/DRESSINGS) ×2 IMPLANT
SUT ETHIBOND NAB CT1 #1 30IN (SUTURE) ×2 IMPLANT
SUT MNCRL AB 4-0 PS2 18 (SUTURE) ×2 IMPLANT
SUT STRATAFIX 0 PDS 27 VIOLET (SUTURE) ×2
SUT VIC AB 2-0 CT1 27 (SUTURE) ×4
SUT VIC AB 2-0 CT1 TAPERPNT 27 (SUTURE) ×2 IMPLANT
SUTURE STRATFX 0 PDS 27 VIOLET (SUTURE) ×1 IMPLANT
SYR 50ML LL SCALE MARK (SYRINGE) IMPLANT
TRAY FOLEY MTR SLVR 16FR STAT (SET/KITS/TRAYS/PACK) ×2 IMPLANT
TUBE SUCTION HIGH CAP CLEAR NV (SUCTIONS) ×2 IMPLANT

## 2021-03-15 NOTE — Transfer of Care (Signed)
Immediate Anesthesia Transfer of Care Note  Patient: Steven Shepard  Procedure(s) Performed: TOTAL HIP ARTHROPLASTY ANTERIOR APPROACH (Left: Hip)  Patient Location: PACU  Anesthesia Type:Spinal  Level of Consciousness: awake, alert  and oriented  Airway & Oxygen Therapy: Patient Spontanous Breathing and Patient connected to face mask  Post-op Assessment: Report given to RN and Post -op Vital signs reviewed and stable  Post vital signs: Reviewed and stable  Last Vitals:  Vitals Value Taken Time  BP 113/60 03/15/21 1349  Temp    Pulse 58 03/15/21 1357  Resp 17 03/15/21 1357  SpO2 100 % 03/15/21 1357  Vitals shown include unvalidated device data.  Last Pain:  Vitals:   03/15/21 1108  TempSrc: Oral  PainSc:       Patients Stated Pain Goal: 3 (22/41/14 6431)  Complications: No notable events documented.

## 2021-03-15 NOTE — Evaluation (Signed)
Physical Therapy Evaluation Patient Details Name: Steven Shepard MRN: [id number] DOB: 07/30/1945 Today's Date: 03/15/2021  History of Present Illness  76 yo male s/p L THA-DA 03/15/21. Hx of R THA-posterior 2012  Clinical Impression  On eval POD 0, pt required Min A for mobility. He was able to stand at beside with RW for a brief period before c/o dizziness. Deferred any further mobility for safety reasons and assisted pt back to supine. Moderate pain with activity. Will plan to follow and progress activity as tolerated. Potential d/c home on tomorrow if pt progresses well.        Recommendations for follow up therapy are one component of a multi-disciplinary discharge planning process, led by the attending physician.  Recommendations may be updated based on patient status, additional functional criteria and insurance authorization.  Follow Up Recommendations Follow physician's recommendations for discharge plan and follow up therapies    Assistance Recommended at Discharge Intermittent Supervision/Assistance  Patient can return home with the following  A little help with walking and/or transfers;A little help with bathing/dressing/bathroom;Help with stairs or ramp for entrance    Equipment Recommendations None recommended by PT  Recommendations for Other Services       Functional Status Assessment Patient has had a recent decline in their functional status and demonstrates the ability to make significant improvements in function in a reasonable and predictable amount of time.     Precautions / Restrictions Precautions Precautions: Fall Restrictions Weight Bearing Restrictions: No Other Position/Activity Restrictions: WBAT      Mobility  Bed Mobility Overal bed mobility: Needs Assistance Bed Mobility: Supine to Sit;Sit to Supine     Supine to sit: Min assist;HOB elevated Sit to supine: Min assist;HOB elevated   General bed mobility comments: Assist for L LE. Increased  time. Cues provided. Mild dizziness.    Transfers Overall transfer level: Needs assistance Equipment used: Rolling walker (2 wheels) Transfers: Sit to/from Stand Sit to Stand: Min assist;From elevated surface           General transfer comment: Assist to power up, stabilize, control descent. Cues provided. Pt stood for ~10 seconds before c/o dizziness worsening. Assisted back into sitting. After a minute or two, had pt perform lateral scooting towards HOB before returning to supine.    Ambulation/Gait               General Gait Details: NT-deferred 2* pt c/o dizziness.  Stairs            Wheelchair Mobility    Modified Rankin (Stroke Patients Only)       Balance Overall balance assessment: Needs assistance Sitting-balance support: Bilateral upper extremity supported;Feet supported Sitting balance-Leahy Scale: Fair     Standing balance support: Bilateral upper extremity supported;Reliant on assistive device for balance Standing balance-Leahy Scale: Poor                               Pertinent Vitals/Pain Pain Assessment: 0-10 Pain Score: 7  Pain Location: L hip/thigh/groin Pain Descriptors / Indicators: Numbness;Discomfort;Aching Pain Intervention(s): Limited activity within patient's tolerance;Monitored during session;Repositioned;Ice applied    Home Living Family/patient expects to be discharged to:: Private residence Living Arrangements: Spouse/significant other Available Help at Discharge: Family Type of Home: House Home Access: Stairs to enter Entrance Stairs-Rails: Right Entrance Stairs-Number of Steps: 2   Home Layout: One level Home Equipment: Conservation officer, nature (2 wheels);BSC/3in1      Prior Function Prior Level of  Function : Independent/Modified Independent                     Hand Dominance        Extremity/Trunk Assessment   Upper Extremity Assessment Upper Extremity Assessment: Overall WFL for tasks  assessed    Lower Extremity Assessment Lower Extremity Assessment: Generalized weakness    Cervical / Trunk Assessment Cervical / Trunk Assessment: Normal  Communication   Communication: No difficulties  Cognition Arousal/Alertness: Awake/alert Behavior During Therapy: WFL for tasks assessed/performed Overall Cognitive Status: Within Functional Limits for tasks assessed                                          General Comments      Exercises     Assessment/Plan    PT Assessment Patient needs continued PT services  PT Problem List Decreased strength;Decreased mobility;Decreased range of motion;Decreased activity tolerance;Decreased balance;Decreased knowledge of use of DME;Pain       PT Treatment Interventions DME instruction;Gait training;Therapeutic activities;Therapeutic exercise;Patient/family education;Balance training;Functional mobility training    PT Goals (Current goals can be found in the Care Plan section)  Acute Rehab PT Goals Patient Stated Goal: less pain. regain independence PT Goal Formulation: With patient/family Time For Goal Achievement: 03/29/21 Potential to Achieve Goals: Good    Frequency 7X/week     Co-evaluation               AM-PAC PT "6 Clicks" Mobility  Outcome Measure Help needed turning from your back to your side while in a flat bed without using bedrails?: A Little Help needed moving from lying on your back to sitting on the side of a flat bed without using bedrails?: A Little Help needed moving to and from a bed to a chair (including a wheelchair)?: A Little Help needed standing up from a chair using your arms (e.g., wheelchair or bedside chair)?: A Little Help needed to walk in hospital room?: A Little Help needed climbing 3-5 steps with a railing? : A Lot 6 Click Score: 17    End of Session Equipment Utilized During Treatment: Gait belt Activity Tolerance: Patient tolerated treatment well Patient left:  in bed;with call bell/phone within reach;with family/visitor present   PT Visit Diagnosis: Other abnormalities of gait and mobility (R26.89);Pain Pain - Right/Left: Left Pain - part of body: Hip    Time: 0092-3300 PT Time Calculation (min) (ACUTE ONLY): 22 min   Charges:   PT Evaluation $PT Eval Low Complexity: [place], PT Acute Rehabilitation  Office: [phone] Pager: [phone]

## 2021-03-15 NOTE — Interval H&P Note (Signed)
History and Physical Interval Note:  03/15/2021 10:48 AM  Steven Shepard  has presented today for surgery, with the diagnosis of left hip osteoarthritis.  The various methods of treatment have been discussed with the patient and family. After consideration of risks, benefits and other options for treatment, the patient has consented to  Procedure(s): TOTAL HIP ARTHROPLASTY ANTERIOR APPROACH (Left) as a surgical intervention.  The patient's history has been reviewed, patient examined, no change in status, stable for surgery.  I have reviewed the patient's chart and labs.  Questions were answered to the patient's satisfaction.     Pilar Plate Otelia Hettinger

## 2021-03-15 NOTE — Op Note (Signed)
OPERATIVE REPORT- TOTAL HIP ARTHROPLASTY   PREOPERATIVE DIAGNOSIS: Osteoarthritis of the Left hip.   POSTOPERATIVE DIAGNOSIS: Osteoarthritis of the Left  hip.   PROCEDURE: Left total hip arthroplasty, anterior approach.   SURGEON: Gaynelle Arabian, MD   ASSISTANT: Theresa Duty, PA-C  ANESTHESIA:  Spinal  ESTIMATED BLOOD LOSS:-550 mL    DRAINS: Hemovac x1.   COMPLICATIONS: None   CONDITION: PACU - hemodynamically stable.   BRIEF CLINICAL NOTE: Steven Shepard is a 76 y.o. male who has advanced end-  stage arthritis of their Left  hip with progressively worsening pain and  dysfunction.The patient has failed nonoperative management and presents for  total hip arthroplasty.   PROCEDURE IN DETAIL: After successful administration of spinal  anesthetic, the traction boots for the [facility] bed were placed on both  feet and the patient was placed onto the [facility] bed, boots placed into the leg  holders. The Left hip was then isolated from the perineum with plastic  drapes and prepped and draped in the usual sterile fashion. ASIS and  greater trochanter were marked and a oblique incision was made, starting  at about 1 cm lateral and 2 cm distal to the ASIS and coursing towards  the anterior cortex of the femur. The skin was cut with a 10 blade  through subcutaneous tissue to the level of the fascia overlying the  tensor fascia lata muscle. The fascia was then incised in line with the  incision at the junction of the anterior third and posterior 2/3rd. The  muscle was teased off the fascia and then the interval between the TFL  and the rectus was developed. The Hohmann retractor was then placed at  the top of the femoral neck over the capsule. The vessels overlying the  capsule were cauterized and the fat on top of the capsule was removed.  A Hohmann retractor was then placed anterior underneath the rectus  femoris to give exposure to the entire anterior capsule. A T-shaped   capsulotomy was performed. The edges were tagged and the femoral head  was identified.       Osteophytes are removed off the superior acetabulum.  The femoral neck was then cut in situ with an oscillating saw. Traction  was then applied to the left lower extremity utilizing the [facility]  traction. The femoral head was then removed. Retractors were placed  around the acetabulum and then circumferential removal of the labrum was  performed. Osteophytes were also removed. Reaming starts at 51 mm to  medialize and  Increased in 2 mm increments to 55 mm. We reamed in  approximately 40 degrees of abduction, 20 degrees anteversion. A 56 mm  pinnacle acetabular shell was then impacted in anatomic position under  fluoroscopic guidance with excellent purchase. We did not need to place  any additional dome screws. A 36 mm neutral + 4 marathon liner was then  placed into the acetabular shell.       The femoral lift was then placed along the lateral aspect of the femur  just distal to the vastus ridge. The leg was  externally rotated and capsule  was stripped off the inferior aspect of the femoral neck down to the  level of the lesser trochanter, this was done with electrocautery. The femur was lifted after this was performed. The  leg was then placed in an extended and adducted position essentially delivering the femur. We also removed the capsule superiorly and the piriformis from the piriformis fossa  to gain excellent exposure of the  proximal femur. Rongeur was used to remove some cancellous bone to get  into the lateral portion of the proximal femur for placement of the  initial starter reamer. The starter broaches was placed  the starter broach  and was shown to go down the center of the canal. Broaching  with the Actis system was then performed starting at size 0  coursing  Up to size 5. A size 5 had excellent torsional and rotational  and axial stability. The trial high offset neck was then placed   with a 36 + 5 trial head. The hip was then reduced. We confirmed that  the stem was in the canal both on AP and lateral x-rays. It also has excellent sizing. The hip was reduced with outstanding stability through full extension and full external rotation.. AP pelvis was taken and the leg lengths were measured and found to be equal. Hip was then dislocated again and the femoral head and neck removed. The  femoral broach was removed. Size 5 Actis stem with a high offset  neck was then impacted into the femur following native anteversion. Has  excellent purchase in the canal. Excellent torsional and rotational and  axial stability. It is confirmed to be in the canal on AP and lateral  fluoroscopic views. The 36 + 5 ceramic head was placed and the hip  reduced with outstanding stability. Again AP pelvis was taken and it  confirmed that the leg lengths were equal. The wound was then copiously  irrigated with saline solution and the capsule reattached and repaired  with Ethibond suture. 30 ml of .25% Bupivicaine was  injected into the capsule and into the edge of the tensor fascia lata as well as subcutaneous tissue. The fascia overlying the tensor fascia lata was then closed with a running #1 V-Loc. Subcu was closed with interrupted 2-0 Vicryl and subcuticular running 4-0 Monocryl. Incision was cleaned  and dried. Steri-Strips and a bulky sterile dressing applied. The patient was awakened and transported to  recovery in stable condition.        Please note that a surgical assistant was a medical necessity for this procedure to perform it in a safe and expeditious manner. Assistant was necessary to provide appropriate retraction of vital neurovascular structures and to prevent femoral fracture and allow for anatomic placement of the prosthesis.  Gaynelle Arabian, M.D.

## 2021-03-15 NOTE — Discharge Instructions (Signed)
Steven Arabian, MD Total Joint Specialist [facility] [place] [address], [address] [place], [place] [address] [phone]  ANTERIOR APPROACH TOTAL HIP REPLACEMENT POSTOPERATIVE DIRECTIONS     Hip Rehabilitation, Guidelines Following Surgery  The results of a hip operation are greatly improved after range of motion and muscle strengthening exercises. Follow all safety measures which are given to protect your hip. If any of these exercises cause increased pain or swelling in your joint, decrease the amount until you are comfortable again. Then slowly increase the exercises. Call your caregiver if you have problems or questions.   HOME CARE INSTRUCTIONS  Remove items at home which could result in a fall. This includes throw rugs or furniture in walking pathways.  ICE to the affected hip as frequently as 20-30 minutes an hour and then as needed for pain and swelling. Continue to use ice on the hip for pain and swelling from surgery. You may notice swelling that will progress down to the foot and ankle. This is normal after surgery. Elevate the leg when you are not up walking on it.   Continue to use the breathing machine which will help keep your temperature down.  It is common for your temperature to cycle up and down following surgery, especially at night when you are not up moving around and exerting yourself.  The breathing machine keeps your lungs expanded and your temperature down.  DIET You may resume your previous home diet once your are discharged from the hospital.  DRESSING / WOUND CARE / SHOWERING You have an adhesive waterproof bandage over the incision. Leave this in place until your first follow-up appointment. Once you remove this you will not need to place another bandage.  You may begin showering 3 days following surgery, but do not submerge the incision under water.  ACTIVITY For the first 3-5 days, it is important to rest and keep the operative leg elevated.  You should, as a general rule, rest for 50 minutes and walk/stretch for 10 minutes per hour. After 5 days, you may slowly increase activity as tolerated.  Perform the exercises you were provided twice a day for about 15-20 minutes each session. Begin these 2 days following surgery. Walk with your walker as instructed. Use the walker until you are comfortable transitioning to a cane. Walk with the cane in the opposite hand of the operative leg. You may discontinue the cane once you are comfortable and walking steadily. Avoid periods of inactivity such as sitting longer than an hour when not asleep. This helps prevent blood clots.  Do not drive a car for 6 weeks or until released by your surgeon.  Do not drive while taking narcotics.  TED HOSE STOCKINGS Wear the elastic stockings on both legs for three weeks following surgery during the day. You may remove them at night while sleeping.  WEIGHT BEARING Weight bearing as tolerated with assist device (walker, cane, etc) as directed, use it as long as suggested by your surgeon or therapist, typically at least 4-6 weeks.  POSTOPERATIVE CONSTIPATION PROTOCOL Constipation - defined medically as fewer than three stools per week and severe constipation as less than one stool per week.  One of the most common issues patients have following surgery is constipation.  Even if you have a regular bowel pattern at home, your normal regimen is likely to be disrupted due to multiple reasons following surgery.  Combination of anesthesia, postoperative narcotics, change in appetite and fluid intake all can affect your bowels.  In order to avoid complications following surgery, here are some recommendations in order to help you during your recovery period.  Colace (docusate) - Pick up an over-the-counter form of Colace or another stool softener and take twice a day as long as you are requiring postoperative pain medications.  Take with a full glass of water daily.  If  you experience loose stools or diarrhea, hold the colace until you stool forms back up.  If your symptoms do not get better within 1 week or if they get worse, check with your doctor. Dulcolax (bisacodyl) - Pick up over-the-counter and take as directed by the product packaging as needed to assist with the movement of your bowels.  Take with a full glass of water.  Use this product as needed if not relieved by Colace only.  MiraLax (polyethylene glycol) - Pick up over-the-counter to have on hand.  MiraLax is a solution that will increase the amount of water in your bowels to assist with bowel movements.  Take as directed and can mix with a glass of water, juice, soda, coffee, or tea.  Take if you go more than two days without a movement.Do not use MiraLax more than once per day. Call your doctor if you are still constipated or irregular after using this medication for 7 days in a row.  If you continue to have problems with postoperative constipation, please contact the office for further assistance and recommendations.  If you experience "the worst abdominal pain ever" or develop nausea or vomiting, please contact the office immediatly for further recommendations for treatment.  ITCHING  If you experience itching with your medications, try taking only a single pain pill, or even half a pain pill at a time.  You can also use Benadryl over the counter for itching or also to help with sleep.   MEDICATIONS See your medication summary on the After Visit Summary that the nursing staff will review with you prior to discharge.  You may have some home medications which will be placed on hold until you complete the course of blood thinner medication.  It is important for you to complete the blood thinner medication as prescribed by your surgeon.  Continue your approved medications as instructed at time of discharge.  PRECAUTIONS If you experience chest pain or shortness of breath - call 911 immediately for  transfer to the hospital emergency department.  If you develop a fever greater that 101 F, purulent drainage from wound, increased redness or drainage from wound, foul odor from the wound/dressing, or calf pain - CONTACT YOUR SURGEON.                                                   FOLLOW-UP APPOINTMENTS Make sure you keep all of your appointments after your operation with your surgeon and caregivers. You should call the office at the above phone number and make an appointment for approximately two weeks after the date of your surgery or on the date instructed by your surgeon outlined in the "After Visit Summary".  RANGE OF MOTION AND STRENGTHENING EXERCISES  These exercises are designed to help you keep full movement of your hip joint. Follow your caregiver's or physical therapist's instructions. Perform all exercises about fifteen times, three times per day or as directed. Exercise both hips, even if you have had only  one joint replacement. These exercises can be done on a training (exercise) mat, on the floor, on a table or on a bed. Use whatever works the best and is most comfortable for you. Use music or television while you are exercising so that the exercises are a pleasant break in your day. This will make your life better with the exercises acting as a break in routine you can look forward to.  Lying on your back, slowly slide your foot toward your buttocks, raising your knee up off the floor. Then slowly slide your foot back down until your leg is straight again.  Lying on your back spread your legs as far apart as you can without causing discomfort.  Lying on your side, raise your upper leg and foot straight up from the floor as far as is comfortable. Slowly lower the leg and repeat.  Lying on your back, tighten up the muscle in the front of your thigh (quadriceps muscles). You can do this by keeping your leg straight and trying to raise your heel off the floor. This helps strengthen the  largest muscle supporting your knee.  Lying on your back, tighten up the muscles of your buttocks both with the legs straight and with the knee bent at a comfortable angle while keeping your heel on the floor.   POST-OPERATIVE OPIOID TAPER INSTRUCTIONS: It is important to wean off of your opioid medication as soon as possible. If you do not need pain medication after your surgery it is ok to stop day one. Opioids include: Codeine, Hydrocodone(Norco, Vicodin), Oxycodone(Percocet, oxycontin) and hydromorphone amongst others.  Long term and even short term use of opiods can cause: Increased pain response Dependence Constipation Depression Respiratory depression And more.  Withdrawal symptoms can include Flu like symptoms Nausea, vomiting And more Techniques to manage these symptoms Hydrate well Eat regular healthy meals Stay active Use relaxation techniques(deep breathing, meditating, yoga) Do Not substitute Alcohol to help with tapering If you have been on opioids for less than two weeks and do not have pain than it is ok to stop all together.  Plan to wean off of opioids This plan should start within one week post op of your joint replacement. Maintain the same interval or time between taking each dose and first decrease the dose.  Cut the total daily intake of opioids by one tablet each day Next start to increase the time between doses. The last dose that should be eliminated is the evening dose.   IF YOU ARE TRANSFERRED TO A SKILLED REHAB FACILITY If the patient is transferred to a skilled rehab facility following release from the hospital, a list of the current medications will be sent to the facility for the patient to continue.  When discharged from the skilled rehab facility, please have the facility set up the patient's [place] prior to being released. Also, the skilled facility will be responsible for providing the patient with their medications at time of  release from the facility to include their pain medication, the muscle relaxants, and their blood thinner medication. If the patient is still at the rehab facility at time of the two week follow up appointment, the skilled rehab facility will also need to assist the patient in arranging follow up appointment in our office and any transportation needs.  MAKE SURE YOU:  Understand these instructions.  Get help right away if you are not doing well or get worse.    DENTAL ANTIBIOTICS:  In most  cases prophylactic antibiotics for Dental procdeures after total joint surgery are not necessary.  Exceptions are as follows:  1. History of prior total joint infection  2. Severely immunocompromised (Organ Transplant, cancer chemotherapy, Rheumatoid biologic meds such as Spencer)  3. Poorly controlled diabetes (A1C &gt; 8.0, blood glucose over 200)  If you have one of these conditions, contact your surgeon for an antibiotic prescription, prior to your dental procedure.    Pick up stool softner and laxative for home use following surgery while on pain medications. Do not submerge incision under water. Please use good hand washing techniques while changing dressing each day. May shower starting three days after surgery. Please use a clean towel to pat the incision dry following showers. Continue to use ice for pain and swelling after surgery. Do not use any lotions or creams on the incision until instructed by your surgeon.

## 2021-03-15 NOTE — Anesthesia Postprocedure Evaluation (Signed)
Anesthesia Post Note  Patient: Steven Shepard  Procedure(s) Performed: TOTAL HIP ARTHROPLASTY ANTERIOR APPROACH (Left: Hip)     Patient location during evaluation: Nursing Unit Anesthesia Type: Spinal Level of consciousness: oriented and awake and alert Pain management: pain level controlled Vital Signs Assessment: post-procedure vital signs reviewed and stable Respiratory status: spontaneous breathing and respiratory function stable Cardiovascular status: blood pressure returned to baseline and stable Postop Assessment: no headache, no backache, no apparent nausea or vomiting and patient able to bend at knees Anesthetic complications: no   No notable events documented.  Last Vitals:  Vitals:   03/15/21 1545 03/15/21 1627  BP: 138/69 (!) 153/72  Pulse: 61 69  Resp: 13 16  Temp: (!) 36.4 C   SpO2: 93% 96%    Last Pain:  Vitals:   03/15/21 1545  TempSrc:   PainSc: 0-No pain                 Barnet Glasgow

## 2021-03-15 NOTE — Care Plan (Signed)
Ortho Bundle Case Management Note  Patient Details  Name: Jarmon Javid MRN: [id number] Date of Birth: [birth year]                  L THA on 03-15-21 DCP: Home with wife DME: No needs, has a RW PT: HEP   DME Arranged:  N/A DME Agency:     HH Arranged:    [place] Agency:     Additional Comments: Please contact me with any questions of if this plan should need to change.  Marianne Sofia, RN,CCM [facility]  [phone] 03/15/2021, 10:50 AM

## 2021-03-15 NOTE — Anesthesia Procedure Notes (Signed)
Spinal  Patient location during procedure: OR Start time: 03/15/2021 12:10 PM End time: 03/15/2021 12:10 PM Reason for block: surgical anesthesia Staffing Performed: resident/CRNA  Resident/CRNA: Rosaland Lao, CRNA Preanesthetic Checklist Completed: patient identified, IV checked, site marked, risks and benefits discussed, surgical consent, monitors and equipment checked, pre-op evaluation and timeout performed Spinal Block Patient position: sitting Prep: DuraPrep Patient monitoring: heart rate, cardiac monitor, continuous pulse ox and blood pressure Approach: midline Location: L2-3 Injection technique: single-shot Needle Needle type: Pencan  Needle gauge: 24 G Needle length: 10 cm Assessment Sensory level: T4 Events: CSF return

## 2021-03-16 DIAGNOSIS — M1612 Unilateral primary osteoarthritis, left hip: Secondary | ICD-10-CM | POA: Diagnosis not present

## 2021-03-16 DIAGNOSIS — I480 Paroxysmal atrial fibrillation: Secondary | ICD-10-CM | POA: Diagnosis not present

## 2021-03-16 DIAGNOSIS — Z85828 Personal history of other malignant neoplasm of skin: Secondary | ICD-10-CM | POA: Diagnosis not present

## 2021-03-16 DIAGNOSIS — J449 Chronic obstructive pulmonary disease, unspecified: Secondary | ICD-10-CM | POA: Diagnosis not present

## 2021-03-16 DIAGNOSIS — Z20822 Contact with and (suspected) exposure to covid-19: Secondary | ICD-10-CM | POA: Diagnosis not present

## 2021-03-16 DIAGNOSIS — I10 Essential (primary) hypertension: Secondary | ICD-10-CM | POA: Diagnosis not present

## 2021-03-16 LAB — CBC
HCT: 32.7 % — ABNORMAL LOW (ref 39.0–52.0)
Hemoglobin: 10.7 g/dL — ABNORMAL LOW (ref 13.0–17.0)
MCH: 30.1 pg (ref 26.0–34.0)
MCHC: 32.7 g/dL (ref 30.0–36.0)
MCV: 91.9 fL (ref 80.0–100.0)
Platelets: 180 K/uL (ref 150–400)
RBC: 3.56 MIL/uL — ABNORMAL LOW (ref 4.22–5.81)
RDW: 13.3 % (ref 11.5–15.5)
WBC: 10.7 K/uL — ABNORMAL HIGH (ref 4.0–10.5)
nRBC: 0 % (ref 0.0–0.2)

## 2021-03-16 LAB — BASIC METABOLIC PANEL WITH GFR
Anion gap: 7 (ref 5–15)
BUN: 22 mg/dL (ref 8–23)
CO2: 25 mmol/L (ref 22–32)
Calcium: 8.4 mg/dL — ABNORMAL LOW (ref 8.9–10.3)
Chloride: 106 mmol/L (ref 98–111)
Creatinine, Ser: 0.86 mg/dL (ref 0.61–1.24)
GFR, Estimated: >60 mL/min (ref >60)
Glucose, Bld: 165 mg/dL — ABNORMAL HIGH (ref 70–99)
Potassium: 3.7 mmol/L (ref 3.5–5.1)
Sodium: 138 mmol/L (ref 135–145)

## 2021-03-16 LAB — GLUCOSE, CAPILLARY
Glucose-Capillary: 144 mg/dL — ABNORMAL HIGH (ref 70–99)
Glucose-Capillary: 211 mg/dL — ABNORMAL HIGH (ref 70–99)

## 2021-03-16 MED ORDER — METHOCARBAMOL 500 MG PO TABS: 500.0000 mg | ORAL_TABLET | Freq: Four times a day (QID) | ORAL | 0 refills | Status: DC | PRN

## 2021-03-16 MED ORDER — HYDROMORPHONE HCL 2 MG PO TABS: 2.0000 mg | ORAL_TABLET | Freq: Four times a day (QID) | ORAL | 0 refills | Status: DC | PRN

## 2021-03-16 MED ORDER — TRAMADOL HCL 50 MG PO TABS: 50.0000 mg | ORAL_TABLET | Freq: Four times a day (QID) | ORAL | 0 refills | Status: DC | PRN

## 2021-03-16 NOTE — Progress Notes (Signed)
° °  Subjective: 1 Day Post-Op Procedure(s) (LRB): TOTAL HIP ARTHROPLASTY ANTERIOR APPROACH (Left) Patient reports pain as mild.   Patient seen in rounds by Dr. Wynelle Link. Patient is well, and has had no acute complaints or problems. Denies SOB, chest pain, or calf pain. No acute overnight events. Dealt with dizziness with therapy yesterday. Will continue therapy today.     Objective: Vital signs in last 24 hours: Temp:  [97.5 F (36.4 C)-98.5 F (36.9 C)] 98.2 F (36.8 C) (01/05 0537) Pulse Rate:  [53-84] 71 (01/05 0537) Resp:  [8-18] 17 (01/05 0537) BP: (115-170)/(62-89) 122/70 (01/05 0537) SpO2:  [92 %-99 %] 94 % (01/05 0537) Weight:  [105.7 kg] 105.7 kg (01/04 1059)  Intake/Output from previous day:  Intake/Output Summary (Last 24 hours) at 03/16/2021 0856 Last data filed at 03/16/2021 0600 Gross per 24 hour  Intake 2072.8 ml  Output 3350 ml  Net -1277.2 ml     Intake/Output this shift: No intake/output data recorded.  Labs: Recent Labs    03/16/21 0337  HGB 10.7*   Recent Labs    03/16/21 0337  WBC 10.7*  RBC 3.56*  HCT 32.7*  PLT 180   Recent Labs    03/16/21 0337  NA 138  K 3.7  CL 106  CO2 25  BUN 22  CREATININE 0.86  GLUCOSE 165*  CALCIUM 8.4*   No results for input(s): LABPT, INR in the last 72 hours.  Exam: General - Patient is Alert and Oriented Extremity - Neurologically intact Neurovascular intact Intact pulses distally Dressing - dressing C/D/I Motor Function - intact, moving foot and toes well on exam.   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   COPD (chronic obstructive pulmonary disease) (HCC)    DOE (dyspnea on exertion)    Dysrhythmia    Elevated bilirubin    Fatty liver    Heart murmur    History of neck surgery    Hyperlipidemia    Hypertension     Mild dilation of ascending aorta (HCC)    OSA on CPAP    PAF (paroxysmal atrial fibrillation) (HCC)    Pre-diabetes    Umbilical hernia     Assessment/Plan: 1 Day Post-Op Procedure(s) (LRB): TOTAL HIP ARTHROPLASTY ANTERIOR APPROACH (Left) Principal Problem:   OA (osteoarthritis) of hip Active Problems:   Primary osteoarthritis of left hip  Estimated body mass index is 30.74 kg/m as calculated from the following:   Height as of this encounter: 6' 1"  (1.854 m).   Weight as of this encounter: 105.7 kg. Up with therapy  DVT Prophylaxis - Xarelto and TED hose Weight bearing as tolerated. Continue therapy.  Plan is to go Home after hospital stay.   Plan for two sessions with PT this morning, and if meeting goals, will plan for discharge this afternoon.   Patient to follow up in two weeks with Dr. Wynelle Link in clinic.   The PDMP database was reviewed today prior to any opioid medications being prescribed to this patient.Fenton Foy, [place], PA-C Orthopedic Surgery [phone] 03/16/2021, 8:56 AM

## 2021-03-16 NOTE — Plan of Care (Signed)
°  Problem: Health Behavior/Discharge Planning: Goal: Ability to manage health-related needs will improve Outcome: Adequate for Discharge   Problem: Clinical Measurements: Goal: Ability to maintain clinical measurements within normal limits will improve Outcome: Adequate for Discharge Goal: Will remain free from infection Outcome: Adequate for Discharge Goal: Diagnostic test results will improve Outcome: Adequate for Discharge Goal: Respiratory complications will improve Outcome: Adequate for Discharge   Problem: Activity: Goal: Risk for activity intolerance will decrease Outcome: Adequate for Discharge   Problem: Nutrition: Goal: Adequate nutrition will be maintained Outcome: Adequate for Discharge   Problem: Coping: Goal: Level of anxiety will decrease Outcome: Adequate for Discharge   Problem: Elimination: Goal: Will not experience complications related to bowel motility Outcome: Adequate for Discharge Goal: Will not experience complications related to urinary retention Outcome: Adequate for Discharge   Problem: Pain Managment: Goal: General experience of comfort will improve Outcome: Adequate for Discharge   Problem: Safety: Goal: Ability to remain free from injury will improve Outcome: Adequate for Discharge   Problem: Skin Integrity: Goal: Risk for impaired skin integrity will decrease Outcome: Adequate for Discharge   Problem: Education: Goal: Knowledge of the prescribed therapeutic regimen will improve Outcome: Adequate for Discharge Goal: Understanding of discharge needs will improve Outcome: Adequate for Discharge   Problem: Activity: Goal: Ability to avoid complications of mobility impairment will improve Outcome: Adequate for Discharge Goal: Ability to tolerate increased activity will improve Outcome: Adequate for Discharge   Problem: Clinical Measurements: Goal: Postoperative complications will be avoided or minimized Outcome: Adequate for  Discharge   Problem: Pain Management: Goal: Pain level will decrease with appropriate interventions Outcome: Adequate for Discharge   Problem: Skin Integrity: Goal: Will show signs of wound healing Outcome: Adequate for Discharge

## 2021-03-16 NOTE — TOC Transition Note (Signed)
Transition of Care [facility]) - CM/SW Discharge Note  Patient Details  Name: [name] MRN: [id number] Date of Birth: 1945-11-11  Transition of [facility]) CM/SW Contact:  Sherie Don, LCSW Phone Number: 03/16/2021, 10:05 AM  Clinical Narrative: Patient is expected to discharge home after working with PT. CSW met with patient and his wife to review discharge plan. Patient will discharge home with a home exercise program (HEP). Patient has a rolling walker and elevated toilets at home, so there are no DME needs at this time. TOC signing off.  Final next level of care: Home/Self Care Barriers to Discharge: No Barriers Identified  Patient Goals and CMS Choice Patient states their goals for this hospitalization and ongoing recovery are:: Discharge home with HEP Choice offered to / list presented to : NA  Discharge Plan and Services        DME Arranged: N/A DME Agency: NA  Readmission Risk Interventions No flowsheet data found.

## 2021-03-16 NOTE — Progress Notes (Signed)
Physical Therapy Treatment Patient Details Name: Steven Shepard MRN: [id number] DOB: January 25, 1946 Today's Date: 03/16/2021   History of Present Illness 76 yo male s/p L THA-DA 03/15/21. Hx of R THA 2012    PT Comments    Pt continues to have dizziness with mobility, resolves in sitting but doesn't in standing. Pt requires min A with bed mobility to assist LLE, educated on using gait belt to self assist. Pt tolerates LE strengthening exercises, provided written/illustrated HEP and all questions answered. Pt able to perform weight-shifting R/L and anterior/posterior while in standing, also minimal standing marching with RW; unable to leave EOB due to dizziness. RN notified of BP 96/62 with dizziness complaints; BP 123/65 once supine with symptoms resolved. Spouse in room, all questions answered. Plan to return for second session to progress PT.   Recommendations for follow up therapy are one component of a multi-disciplinary discharge planning process, led by the attending physician.  Recommendations may be updated based on patient status, additional functional criteria and insurance authorization.  Follow Up Recommendations  Follow physician's recommendations for discharge plan and follow up therapies     Assistance Recommended at Discharge Intermittent Supervision/Assistance  Patient can return home with the following A little help with walking and/or transfers;A little help with bathing/dressing/bathroom;Help with stairs or ramp for entrance   Equipment Recommendations  None recommended by PT    Recommendations for Other Services       Precautions / Restrictions Precautions Precautions: Fall Precaution Comments: monitor BP Restrictions Weight Bearing Restrictions: No Other Position/Activity Restrictions: WBAT     Mobility  Bed Mobility Overal bed mobility: Needs Assistance Bed Mobility: Supine to Sit;Sit to Supine  Supine to sit: Min assist Sit to supine: Min assist    General bed mobility comments: assist for LLE mobility, pt attempting to self assist with gait belt but limited    Transfers Overall transfer level: Needs assistance Equipment used: Rolling walker (2 wheels) Transfers: Sit to/from Stand Sit to Stand: Min guard;From elevated surface  General transfer comment: VC for hand placement, powers from elevated bed to simulate home bed set up, increased dizziness that didn't resolve after ~3 minutes- BP 96/62 so assisted back to supine    Ambulation/Gait  General Gait Details: not attempted due to dizziness and BP   Stairs             Wheelchair Mobility    Modified Rankin (Stroke Patients Only)       Balance Overall balance assessment: Needs assistance Sitting-balance support: Feet supported Sitting balance-Leahy Scale: Fair  Standing balance support: During functional activity;Bilateral upper extremity supported;Reliant on assistive device for balance Standing balance-Leahy Scale: Poor     Cognition Arousal/Alertness: Awake/alert Behavior During Therapy: WFL for tasks assessed/performed Overall Cognitive Status: Within Functional Limits for tasks assessed      Exercises Total Joint Exercises Ankle Circles/Pumps: Supine;AROM;Both;10 reps Quad Sets: Supine;Strengthening;Both;5 reps Long Arc Quad: Seated;Strengthening;Both;5 reps Knee Flexion: Seated;Strengthening;Both;5 reps    General Comments General comments (skin integrity, edema, etc.): Dizziness in sitting that resvoled with time, increased dizziness upon standing that didn't resolve after ~3 minutes, BP 96/62; returned to supine and BP 123/65 with resolution of symptoms- RN notified      Pertinent Vitals/Pain Pain Assessment: 0-10 Pain Score: 5  Pain Location: L thigh/hip Pain Descriptors / Indicators: Sore;Discomfort ("like I was hit with a baseball bat") Pain Intervention(s): Limited activity within patient's tolerance;Monitored during session;Premedicated  before session;Repositioned;Ice applied    Home Living  Prior Function            PT Goals (current goals can now be found in the care plan section) Acute Rehab PT Goals Patient Stated Goal: less pain. regain independence PT Goal Formulation: With patient/family Time For Goal Achievement: 03/29/21 Potential to Achieve Goals: Good Progress towards PT goals: Progressing toward goals    Frequency    7X/week      PT Plan Current plan remains appropriate    Co-evaluation              AM-PAC PT "6 Clicks" Mobility   Outcome Measure  Help needed turning from your back to your side while in a flat bed without using bedrails?: A Little Help needed moving from lying on your back to sitting on the side of a flat bed without using bedrails?: A Little Help needed moving to and from a bed to a chair (including a wheelchair)?: A Little Help needed standing up from a chair using your arms (e.g., wheelchair or bedside chair)?: A Little Help needed to walk in hospital room?: A Little Help needed climbing 3-5 steps with a railing? : A Lot 6 Click Score: 17    End of Session Equipment Utilized During Treatment: Gait belt Activity Tolerance: Patient tolerated treatment well;Other (comment) (BP) Patient left: in bed;with call bell/phone within reach;with family/visitor present;with nursing/sitter in room Nurse Communication: Mobility status;Other (comment) (BP) PT Visit Diagnosis: Other abnormalities of gait and mobility (R26.89);Pain Pain - Right/Left: Left Pain - part of body: Hip     Time: 4469-5072 PT Time Calculation (min) (ACUTE ONLY): 43 min  Charges:  $Therapeutic Exercise: 8-22 mins $Therapeutic Activity: 23-37 mins                      Tori Julann Mcgilvray PT, DPT 03/16/21, 10:19 AM

## 2021-03-16 NOTE — Progress Notes (Signed)
Physical Therapy Treatment Patient Details Name: Steven Shepard MRN: [id number] DOB: Jul 29, 1945 Today's Date: 03/16/2021   History of Present Illness 76 yo male s/p L THA-DA 03/15/21. Hx of R THA 2012    PT Comments    Pt without dizziness this session. Pt ambulates 140 ft + additional 60 ft with RW, step to pattern, improving to slight step through, significantly decreased cadence. Pt able to ascend/descend 3 steps with L handrail to ascend, keeping proper sequencing and safe distance from handrail; spouse educated on technique and able to assist appropriately. Reviewed bed mobility using gait belt to self assist, pt able to exit and return to supine with therapist placing gait belt around L foot, increased time. Educated pt and spouse on HEP, mobility at home, and all questions answered. Pt with good family support, all questions answered and ready to d/c home- RN notified.   Recommendations for follow up therapy are one component of a multi-disciplinary discharge planning process, led by the attending physician.  Recommendations may be updated based on patient status, additional functional criteria and insurance authorization.  Follow Up Recommendations  Follow physician's recommendations for discharge plan and follow up therapies     Assistance Recommended at Discharge Intermittent Supervision/Assistance  Patient can return home with the following A little help with walking and/or transfers;A little help with bathing/dressing/bathroom;Help with stairs or ramp for entrance   Equipment Recommendations  None recommended by PT    Recommendations for Other Services       Precautions / Restrictions Precautions Precautions: Fall Restrictions Other Position/Activity Restrictions: WBAT     Mobility  Bed Mobility Overal bed mobility: Needs Assistance Bed Mobility: Supine to Sit;Sit to Supine  Supine to sit: Min guard Sit to supine: Min guard  General bed mobility comments: using  gait belt to self assist LLE off of/onto bed, significantly increased time    Transfers Overall transfer level: Needs assistance Equipment used: Rolling walker (2 wheels) Transfers: Sit to/from Stand Sit to Stand: Supervision  General transfer comment: powers to stand with BUE assisting, LLE extended from lower seated recliner, good steadiness    Ambulation/Gait Ambulation/Gait assistance: Supervision Gait Distance (Feet): 140 Feet (+ additional 60) Assistive device: Rolling walker (2 wheels) Gait Pattern/deviations: Step-to pattern;Decreased stride length;Decreased weight shift to left Gait velocity: decreased  General Gait Details: step to pattern with decreased L weight-bearing, improves to slight step through pattern with distance, significantly decreased cadence, good steadiness   Stairs Stairs: Yes Stairs assistance: Supervision Stair Management: One rail Left Number of Stairs: 6 General stair comments: L handrail to ascend, good carryover for proper sequencing, good steadiness without either LE buckling noted and no LOB   Wheelchair Mobility    Modified Rankin (Stroke Patients Only)       Balance Overall balance assessment: Needs assistance Sitting-balance support: Feet supported Sitting balance-Leahy Scale: Good  Standing balance support: During functional activity;Bilateral upper extremity supported;Reliant on assistive device for balance    Cognition Arousal/Alertness: Awake/alert Behavior During Therapy: WFL for tasks assessed/performed Overall Cognitive Status: Within Functional Limits for tasks assessed     Exercises      General Comments General comments (skin integrity, edema, etc.): Pt denies dizziness throughout session      Pertinent Vitals/Pain Pain Assessment: 0-10 Pain Score: 5  Pain Location: L thigh/hip Pain Descriptors / Indicators: Sore;Discomfort;Tightness;Aching Pain Intervention(s): Limited activity within patient's  tolerance;Monitored during session;Repositioned;Ice applied    Home Living  Prior Function            PT Goals (current goals can now be found in the care plan section) Acute Rehab PT Goals Patient Stated Goal: less pain. regain independence PT Goal Formulation: With patient/family Time For Goal Achievement: 03/29/21 Potential to Achieve Goals: Good Progress towards PT goals: Progressing toward goals    Frequency    7X/week      PT Plan Current plan remains appropriate    Co-evaluation              AM-PAC PT "6 Clicks" Mobility   Outcome Measure  Help needed turning from your back to your side while in a flat bed without using bedrails?: A Little Help needed moving from lying on your back to sitting on the side of a flat bed without using bedrails?: A Little Help needed moving to and from a bed to a chair (including a wheelchair)?: A Little Help needed standing up from a chair using your arms (e.g., wheelchair or bedside chair)?: A Little Help needed to walk in hospital room?: A Little Help needed climbing 3-5 steps with a railing? : A Little 6 Click Score: 18    End of Session Equipment Utilized During Treatment: Gait belt Activity Tolerance: Patient tolerated treatment well Patient left: in bed;with call bell/phone within reach;with family/visitor present Nurse Communication: Mobility status PT Visit Diagnosis: Other abnormalities of gait and mobility (R26.89);Pain Pain - Right/Left: Left Pain - part of body: Hip     Time: 6945-0388 PT Time Calculation (min) (ACUTE ONLY): 63 min  Charges:  $Gait Training: 23-37 mins $Therapeutic Activity: 8-22 mins $Self Care/Home Management: 8-22                      Tori Inioluwa Boulay PT, DPT 03/16/21, 3:29 PM

## 2021-03-19 LAB — TYPE AND SCREEN
ABO/RH(D): O POS
Antibody Screen: POSITIVE
Unit division: 0
Unit division: 0

## 2021-03-19 LAB — BPAM RBC
Blood Product Expiration Date: 202301172359
Blood Product Expiration Date: 202301172359
Unit Type and Rh: 5100
Unit Type and Rh: 5100

## 2021-03-20 ENCOUNTER — Encounter (HOSPITAL_COMMUNITY): Payer: Self-pay | Admitting: Orthopedic Surgery

## 2021-03-22 ENCOUNTER — Other Ambulatory Visit: Payer: Medicare Other

## 2021-03-22 ENCOUNTER — Ambulatory Visit: Payer: Medicare Other | Admitting: Family

## 2021-03-26 NOTE — Discharge Summary (Signed)
Physician Discharge Summary   Patient ID: Steven Shepard MRN: [id number] DOB/AGE: [birth year] 76 y.o.  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854 m).   Weight as of this encounter: 105.7 kg.  Procedure:  Procedure(s) (LRB): TOTAL HIP ARTHROPLASTY ANTERIOR APPROACH (Left)   Consults: None  HPI: Steven Shepard is a 76 y.o. male who has advanced end-  stage arthritis of their Left  hip with progressively worsening pain and  dysfunction.The patient has failed nonoperative management and presents for  total hip arthroplasty.   Laboratory Data: Admission on 03/15/2021, Discharged on 03/16/2021  Component Date Value Ref Range Status   SARS Coronavirus 2 03/15/2021 NEGATIVE  NEGATIVE Final   Comment: (NOTE) SARS-CoV-2 target nucleic acids are NOT DETECTED.  The SARS-CoV-2 RNA is generally detectable in upper and lower respiratory specimens during the acute phase of infection. The lowest concentration of SARS-CoV-2 viral copies this assay can detect is 250 copies / mL. A negative result does  not preclude SARS-CoV-2 infection and should not be used as the sole basis for treatment or other patient management decisions.  A negative result may occur with improper specimen collection / handling, submission of specimen other than nasopharyngeal swab, presence of viral mutation(s) within the areas targeted by this assay, and inadequate number of viral copies (<250 copies / mL). A negative result must be combined with clinical observations, patient history, and epidemiological information.  Fact Sheet for Patients:   StrictlyIdeas.no  Fact Sheet for Healthcare Providers: BankingDealers.co.za  This test is not yet approved or                           cleared by the [place] FDA and has been authorized for detection and/or diagnosis of SARS-CoV-2 by FDA under an Emergency Use Authorization (EUA).  This EUA will remain in effect (meaning this test can be used) for the duration of the COVID-19 declaration under Section 564(b)(1) of the Act, 21 U.S.C. section 360bbb-3(b)(1), unless the authorization is terminated or revoked sooner.  Performed at [facility], [place] [address] [place], [place] [address]    ABO/RH(D) 03/15/2021 O POS   Final   Antibody Screen 03/15/2021 POS   Final   Sample Expiration 03/15/2021 03/18/2021,2359   Final   Unit Number 03/15/2021 [id number]   Final   Blood Component Type 03/15/2021 RED CELLS,LR   Final   Unit division 03/15/2021 00  Final   Status of Unit 03/15/2021 REL FROM [facility]   Final   Transfusion Status 03/15/2021 OK TO TRANSFUSE   Final   Crossmatch Result 03/15/2021 COMPATIBLE   Final   Unit Number 03/15/2021 [id number]   Final   Blood Component Type 03/15/2021 RED CELLS,LR   Final   Unit division 03/15/2021 00   Final   Status of Unit 03/15/2021 REL FROM [facility]   Final   Transfusion Status 03/15/2021 OK TO TRANSFUSE   Final   Crossmatch Result 03/15/2021  COMPATIBLE   Final   Glucose-Capillary 03/15/2021 120 (H)  70 - 99 mg/dL Final   Glucose reference range applies only to samples taken after fasting for at least 8 hours.   Blood Product Unit Number 03/15/2021 [id number]   Final   PRODUCT CODE 03/15/2021 [id number]   Final   Unit Type and Rh 03/15/2021 5100   Final   Blood Product Expiration Date 03/15/2021 [id number]   Final   Blood Product Unit Number 03/15/2021 [id number]   Final   PRODUCT CODE 03/15/2021 [id number]   Final   Unit Type and Rh 03/15/2021 5100   Final   Blood Product Expiration Date 03/15/2021 [id number]   Final   Glucose-Capillary 03/15/2021 134 (H)  70 - 99 mg/dL Final   Glucose reference range applies only to samples taken after fasting for at least 8 hours.   WBC 03/16/2021 10.7 (H)  4.0 - 10.5 K/uL Final   RBC 03/16/2021 3.56 (L)  4.22 - 5.81 MIL/uL Final   Hemoglobin 03/16/2021 10.7 (L)  13.0 - 17.0 g/dL Final   HCT 03/16/2021 32.7 (L)  39.0 - 52.0 % Final   MCV 03/16/2021 91.9  80.0 - 100.0 fL Final   MCH 03/16/2021 30.1  26.0 - 34.0 pg Final   MCHC 03/16/2021 32.7  30.0 - 36.0 g/dL Final   CORRECTED FOR COLD AGGLUTININS   RDW 03/16/2021 13.3  11.5 - 15.5 % Final   Platelets 03/16/2021 180  150 - 400 K/uL Final   nRBC 03/16/2021 0.0  0.0 - 0.2 % Final   Performed at [facility], [place] [address] [place], [place] [address]   Sodium 03/16/2021 138  135 - 145 mmol/L Final   Potassium 03/16/2021 3.7  3.5 - 5.1 mmol/L Final   Chloride 03/16/2021 106  98 - 111 mmol/L Final   CO2 03/16/2021 25  22 - 32 mmol/L Final   Glucose, Bld 03/16/2021 165 (H)  70 - 99 mg/dL Final   Glucose reference range applies only to samples taken after fasting for at least 8 hours.   BUN 03/16/2021 22  8 - 23 mg/dL Final   Creatinine, Ser 03/16/2021 0.86  0.61 - 1.24 mg/dL Final   Calcium 03/16/2021 8.4 (L)  8.9 - 10.3 mg/dL Final   GFR, Estimated 03/16/2021 >60  >60 mL/min Final   Comment: (NOTE) Calculated  using the CKD-EPI Creatinine Equation (2021)    Anion gap 03/16/2021 7  5 - 15 Final   Performed at [facility], [place] [address] [place], [place] [address]   Glucose-Capillary 03/15/2021 199 (H)  70 - 99 mg/dL Final   Glucose reference range applies only to samples taken after fasting for at least 8 hours.   Glucose-Capillary 03/16/2021 144 (H)  70 - 99 mg/dL Final   Glucose reference range applies only to samples taken after fasting for at least 8 hours.   Glucose-Capillary 03/16/2021 211 (H)  70 - 99 mg/dL Final  Glucose reference range applies only to samples taken after fasting for at least 8 hours.  Appointment on 03/08/2021  Component Date Value Ref Range Status   WBC Count 03/08/2021 7.7  4.0 - 10.5 K/uL Final   RBC 03/08/2021 3.50 (L)  4.22 - 5.81 MIL/uL Final   Hemoglobin 03/08/2021 11.9 (L)  13.0 - 17.0 g/dL Final   HCT 03/08/2021 34.0 (L)  39.0 - 52.0 % Final   MCV 03/08/2021 97.1  80.0 - 100.0 fL Final   MCH 03/08/2021 32.9  26.0 - 34.0 pg Final   MCHC 03/08/2021 33.8  30.0 - 36.0 g/dL Final   RDW 03/08/2021 13.8  11.5 - 15.5 % Final   Platelet Count 03/08/2021 210  150 - 400 K/uL Final   nRBC 03/08/2021 0.0  0.0 - 0.2 % Final   Neutrophils Relative % 03/08/2021 58  % Final   Neutro Abs 03/08/2021 4.5  1.7 - 7.7 K/uL Final   Lymphocytes Relative 03/08/2021 31  % Final   Lymphs Abs 03/08/2021 2.4  0.7 - 4.0 K/uL Final   Monocytes Relative 03/08/2021 8  % Final   Monocytes Absolute 03/08/2021 0.6  0.1 - 1.0 K/uL Final   Eosinophils Relative 03/08/2021 1  % Final   Eosinophils Absolute 03/08/2021 0.1  0.0 - 0.5 K/uL Final   Basophils Relative 03/08/2021 1  % Final   Basophils Absolute 03/08/2021 0.1  0.0 - 0.1 K/uL Final   Immature Granulocytes 03/08/2021 1  % Final   Abs Immature Granulocytes 03/08/2021 0.04  0.00 - 0.07 K/uL Final   Performed at [facility] at [facility], [address] [place], [place] [address]    Sodium 03/08/2021 138  135 - 145 mmol/L Final   Potassium 03/08/2021 4.4  3.5 - 5.1 mmol/L Final   Chloride 03/08/2021 102  98 - 111 mmol/L Final   CO2 03/08/2021 32  22 - 32 mmol/L Final   Glucose, Bld 03/08/2021 117 (H)  70 - 99 mg/dL Final   Glucose reference range applies only to samples taken after fasting for at least 8 hours.   BUN 03/08/2021 17  8 - 23 mg/dL Final   Creatinine 03/08/2021 0.87  0.61 - 1.24 mg/dL Final   Calcium 03/08/2021 9.3  8.9 - 10.3 mg/dL Final   Total Protein 03/08/2021 6.5  6.5 - 8.1 g/dL Final   Albumin 03/08/2021 4.0  3.5 - 5.0 g/dL Final   AST 03/08/2021 16  15 - 41 U/L Final   ALT 03/08/2021 20  0 - 44 U/L Final   Alkaline Phosphatase 03/08/2021 72  38 - 126 U/L Final   Total Bilirubin 03/08/2021 1.9 (H)  0.3 - 1.2 mg/dL Final   GFR, Estimated 03/08/2021 >60  >60 mL/min Final   Comment: (NOTE) Calculated using the CKD-EPI Creatinine Equation (2021)    Anion gap 03/08/2021 4 (L)  5 - 15 Final   Performed at [facility] at [facility], [place], [place], [place] [address]   Blood Bank Specimen 03/08/2021 SAMPLE AVAILABLE FOR TESTING   Final   Sample Expiration 03/08/2021    Final                   Value:03/11/2021,2359 Performed at [facility], [place] [address] [place], [place] [address]    LDH 03/08/2021 145  98 - 192 U/L Final   Performed at [facility] at [facility]  [address], [address], [place], [place] [address]   Cold Agglutinin Titer 03/08/2021 >1:4096  Neg <1:32 Final   Comment: (NOTE) Performed At: [facility] [place], [place] [id number] Rush Farmer MD XB:[id number]   Hospital Outpatient Visit on 03/02/2021  Component Date Value Ref Range Status   MRSA, PCR 03/02/2021 NEGATIVE  NEGATIVE Final   Staphylococcus aureus 03/02/2021 NEGATIVE  NEGATIVE Final   Comment: (NOTE) The Xpert SA Assay (FDA approved for NASAL specimens in patients  59 years of age and older), is one component of a comprehensive surveillance program. It is not intended to diagnose infection nor to guide or monitor treatment. Performed at [facility], [place] [address], [place], [place] [address]    WBC 03/02/2021 5.7  4.0 - 10.5 K/uL Final   RBC 03/02/2021 3.47 (L)  4.22 - 5.81 MIL/uL Final   Hemoglobin 03/02/2021 12.1 (L)  13.0 - 17.0 g/dL Final   HCT 03/02/2021 33.5 (L)  39.0 - 52.0 % Final   MCV 03/02/2021 96.5  80.0 - 100.0 fL Final   MCH 03/02/2021 34.9 (H)  26.0 - 34.0 pg Final   MCHC 03/02/2021 36.1 (H)  30.0 - 36.0 g/dL Final   Comment: CORRECTED FOR COLD AGGLUTININS PRE WARMING TECHNIQUE USED.    RDW 03/02/2021 14.5  11.5 - 15.5 % Final   Platelets 03/02/2021 210  150 - 400 K/uL Final   nRBC 03/02/2021 0.0  0.0 - 0.2 % Final   Performed at [facility], [place] [address], [place], [place] [address]   Sodium 03/02/2021 138  135 - 145 mmol/L Final   Potassium 03/02/2021 3.9  3.5 - 5.1 mmol/L Final   Chloride 03/02/2021 102  98 - 111 mmol/L Final   CO2 03/02/2021 29  22 - 32 mmol/L Final   Glucose, Bld 03/02/2021 150 (H)  70 - 99 mg/dL Final   Glucose reference range applies only to samples taken after fasting for at least 8 hours.   BUN 03/02/2021 16  8 - 23 mg/dL Final   Creatinine, Ser 03/02/2021 0.74  0.61 - 1.24 mg/dL Final   Calcium 03/02/2021 8.9  8.9 - 10.3 mg/dL Final   Total Protein 03/02/2021 7.2  6.5 - 8.1 g/dL Final   Albumin 03/02/2021 4.1  3.5 - 5.0 g/dL Final   AST 03/02/2021 23  15 - 41 U/L Final   ALT 03/02/2021 33  0 - 44 U/L Final   Alkaline Phosphatase 03/02/2021 72  38 - 126 U/L Final   Total Bilirubin 03/02/2021 2.5 (H)  0.3 - 1.2 mg/dL Final   GFR, Estimated 03/02/2021 >60  >60 mL/min Final   Comment: (NOTE) Calculated using the CKD-EPI Creatinine Equation (2021)    Anion gap 03/02/2021 7  5 - 15 Final   Performed at [facility], [place] [address]  [place], [place] [address]   ABO/RH(D) 03/02/2021 O POS   Final   Antibody Screen 03/02/2021 POS   Final   Sample Expiration 03/02/2021 03/14/2021,2359   Final   DAT, IgG 03/02/2021 NEG   Final   Antibody Identification 84/13/2440 ANTI I   Final   Unit Number 03/02/2021 [id number]   Final   Blood Component Type 03/02/2021 RED CELLS,LR   Final   Unit division 03/02/2021 00   Final   Status of Unit 03/02/2021 REL FROM [facility]   Final   Transfusion Status 03/02/2021 OK TO TRANSFUSE   Final   Crossmatch Result  03/02/2021 COMPATIBLE   Final   Unit Number 03/02/2021 [id number]   Final   Blood Component Type 03/02/2021 RED CELLS,LR   Final   Unit division 03/02/2021 00   Final   Status of Unit 03/02/2021 REL FROM [facility]   Final   Transfusion Status 03/02/2021 OK TO TRANSFUSE   Final   Crossmatch Result 03/02/2021 COMPATIBLE   Final   Prothrombin Time 03/02/2021 14.5  11.4 - 15.2 seconds Final   INR 03/02/2021 1.1  0.8 - 1.2 Final   Comment: (NOTE) INR goal varies based on device and disease states. Performed at [facility], [place] [address] [place],  [address]    Glucose-Capillary 03/02/2021 184 (H)  70 - 99 mg/dL Final   Glucose reference range applies only to samples taken after fasting for at least 8 hours.   Blood Product Unit Number 03/02/2021 [id number]   Final   PRODUCT CODE 03/02/2021 [id number]   Final   Unit Type and Rh 03/02/2021 5100   Final   Blood Product Expiration Date 03/02/2021 [id number]   Final   Blood Product Unit Number 03/02/2021 [id number]   Final   PRODUCT CODE 03/02/2021 [id number]   Final   Unit Type and Rh 03/02/2021 5100   Final   Blood Product Expiration Date 03/02/2021 [id number]   Final     X-Rays:DG Pelvis Portable  Result Date: 03/15/2021 CLINICAL DATA:  Status post left hip replacement. EXAM: PORTABLE PELVIS 1-2 VIEWS COMPARISON:  03/15/2021 intraoperative images FINDINGS: Status post left total hip arthroplasty, in near  anatomic alignment. Redemonstrated prior right total hip arthroplasty. There is no evidence of pelvic fracture or diastasis. No pelvic bone lesions are seen. Air in the overlying soft tissues, not unexpected postoperatively. IMPRESSION: Expected postoperative appearance of left total hip arthroplasty. Electronically Signed   By: Merilyn Baba M.D.   On: 03/15/2021 14:19   DG C-Arm 1-60 Min-No Report  Result Date: 03/15/2021 Fluoroscopy was utilized by the requesting physician.  No radiographic interpretation.   DG HIP OPERATIVE UNILAT W OR W/O PELVIS LEFT  Result Date: 03/15/2021 CLINICAL DATA:  76 year old male undergoing left hip replacement. Pre-existing right total hip arthroplasty. EXAM: OPERATIVE choose 1 HIP (WITH PELVIS IF PERFORMED) 4 VIEWS TECHNIQUE: Fluoroscopic spot image(s) were submitted for interpretation post-operatively. COMPARISON:  CT Abdomen and Pelvis 12/14/2014. FINDINGS: Four intraoperative fluoroscopic AP spot images of the left hip and lower pelvis demonstrate pre-existing right total hip arthroplasty, placement of bipolar type left hip arthroplasty. Normal hardware AP alignment. FLUOROSCOPY TIME:  0 minutes 7 seconds IMPRESSION: Intraoperative images of left bipolar hip arthroplasty. Electronically Signed   By: Genevie Ann M.D.   On: 03/15/2021 13:33    EKG: Orders placed or performed in visit on 10/19/20   EKG 12-Lead     Hospital Course: Dolph Tavano is a 76 y.o. who was admitted to [facility]. They were brought to the operating room on 03/15/2021 and underwent Procedure(s): [place].  Patient tolerated the procedure well and was later transferred to the recovery room and then to the orthopaedic floor for postoperative care. They were given PO and IV analgesics for pain control following their surgery. They were given 24 hours of postoperative antibiotics of  Anti-infectives (From admission, onward)    Start     Dose/Rate Route  Frequency Ordered Stop   03/15/21 1800  ceFAZolin (ANCEF) IVPB 2g/100 mL premix        2 g 200 mL/hr over 30  Minutes Intravenous Every 6 hours 03/15/21 1612 03/16/21 0124   03/15/21 1045  ceFAZolin (ANCEF) IVPB 2g/100 mL premix        2 g 200 mL/hr over 30 Minutes Intravenous On call to O.R. 03/15/21 1044 03/15/21 1158      and started on DVT prophylaxis in the form of Xarelto and TED hose.   PT and OT were ordered for total joint protocol. Discharge planning consulted to help with postop disposition and equipment needs.  Patient had an uneventful night on the evening of surgery. They started to get up OOB with therapy on POD#1. Pt was seen during rounds and was ready to go home pending progress with therapy. He worked with therapy on POD #1 and was meeting goals. Pt was discharged to home later that day in stable condition.  Diet: Regular diet Activity: WBAT Follow-up: in two weeks Disposition: Home Discharged Condition: good   Discharge Instructions     Call MD / Call 911   Complete by: As directed    If you experience chest pain or shortness of breath, CALL 911 and be transported to the hospital emergency room.  If you develope a fever above 101 F, pus (white drainage) or increased drainage or redness at the wound, or calf pain, call your surgeon's office.   Change dressing   Complete by: As directed    You have an adhesive waterproof bandage over the incision. Leave this in place until your first follow-up appointment. Once you remove this you will not need to place another bandage.   Constipation Prevention   Complete by: As directed    Drink plenty of fluids.  Prune juice may be helpful.  You may use a stool softener, such as Colace (over the counter) 100 mg twice a day.  Use MiraLax (over the counter) for constipation as needed.   Diet - low sodium heart healthy   Complete by: As directed    Do not sit on low chairs, stoools or toilet seats, as it may be difficult to get up from  low surfaces   Complete by: As directed    Driving restrictions   Complete by: As directed    No driving for two weeks   Post-operative opioid taper instructions:   Complete by: As directed    POST-OPERATIVE OPIOID TAPER INSTRUCTIONS: It is important to wean off of your opioid medication as soon as possible. If you do not need pain medication after your surgery it is ok to stop day one. Opioids include: Codeine, Hydrocodone(Norco, Vicodin), Oxycodone(Percocet, oxycontin) and hydromorphone amongst others.  Long term and even short term use of opiods can cause: Increased pain response Dependence Constipation Depression Respiratory depression And more.  Withdrawal symptoms can include Flu like symptoms Nausea, vomiting And more Techniques to manage these symptoms Hydrate well Eat regular healthy meals Stay active Use relaxation techniques(deep breathing, meditating, yoga) Do Not substitute Alcohol to help with tapering If you have been on opioids for less than two weeks and do not have pain than it is ok to stop all together.  Plan to wean off of opioids This plan should start within one week post op of your joint replacement. Maintain the same interval or time between taking each dose and first decrease the dose.  Cut the total daily intake of opioids by one tablet each day Next start to increase the time between doses. The last dose that should be eliminated is the evening dose.  TED hose   Complete by: As directed    Use stockings (TED hose) for three weeks on both leg(s).  You may remove them at night for sleeping.   Weight bearing as tolerated   Complete by: As directed       Allergies as of 03/16/2021       Reactions   Ace Inhibitors Other (See Comments)   High K+   Codeine Other (See Comments)   Hallucinations   Spironolactone Other (See Comments)   Increases potassium        Medication List     STOP taking these medications    amoxicillin 500 MG  capsule Commonly known as: AMOXIL       TAKE these medications    acetaminophen 650 MG CR tablet Commonly known as: TYLENOL Take 650 mg by mouth 2 (two) times daily as needed for pain.   amLODipine 10 MG tablet Commonly known as: NORVASC Take 1 tablet (10 mg total) by mouth daily. What changed: when to take this   atorvastatin 20 MG tablet Commonly known as: LIPITOR TAKE 1 TABLET(20 MG) BY MOUTH DAILY What changed:  how much to take how to take this when to take this additional instructions   carvedilol 12.5 MG tablet Commonly known as: COREG Take 1 tablet (12.5 mg total) by mouth 2 (two) times daily.   EMERGEN-C IMMUNE PO Take 1 packet by mouth See admin instructions. Mix 1 packet into 4-8 ounces of water with one well-rounded teaspoonful of Metamucil powder and drink every morning   One-A-Day Mens 50+ Tabs Take 1 tablet by mouth daily with breakfast.   fexofenadine 180 MG tablet Commonly known as: ALLEGRA Take 180 mg by mouth in the morning.   folic acid 614 MCG tablet Commonly known as: FOLVITE Take 800 mcg by mouth 2 (two) times daily.   furosemide 40 MG tablet Commonly known as: LASIX TAKE 1 TABLET BY MOUTH TWICE DAILY AT 8 AM AND AT 2 PM What changed: See the new instructions.   HYDROmorphone 2 MG tablet Commonly known as: DILAUDID Take 1-2 tablets (2-4 mg total) by mouth every 6 (six) hours as needed for severe pain.   Krill Oil 500 MG Caps Take 500 mg by mouth daily with supper.   METAMUCIL FIBER PO Take by mouth See admin instructions. Mix 1 well-rounded teaspoonful of powder into 4-8 ounces of water with one Emergen-C packet and drink every morning   metFORMIN 1000 MG tablet Commonly known as: GLUCOPHAGE Take 500 mg by mouth in the morning.   methocarbamol 500 MG tablet Commonly known as: ROBAXIN Take 1 tablet (500 mg total) by mouth every 6 (six) hours as needed for muscle spasms.   NASAL CLEANSE RINSE MIX NA Place into the nose in the  morning.   potassium chloride SA 20 MEQ tablet Commonly known as: KLOR-CON M Take 1 tablet (20 mEq total) by mouth 2 (two) times daily. What changed:  when to take this additional instructions   PRESCRIPTION MEDICATION CPAP- At bedtime and during any time of rest   rivaroxaban 20 MG Tabs tablet Commonly known as: XARELTO Take 1 tablet (20 mg total) by mouth daily with supper.   tamsulosin 0.4 MG Caps capsule Commonly known as: FLOMAX Take 1 capsule (0.4 mg total) by mouth daily. What changed: when to take this   traMADol 50 MG tablet Commonly known as: ULTRAM Take 1-2 tablets (50-100 mg total) by mouth every 6 (six) hours as needed for moderate  pain.               Discharge Care Instructions  (From admission, onward)           Start     Ordered   03/16/21 0000  Weight bearing as tolerated        03/16/21 0904   03/16/21 0000  Change dressing       Comments: You have an adhesive waterproof bandage over the incision. Leave this in place until your first follow-up appointment. Once you remove this you will not need to place another bandage.   03/16/21 3685            Follow-up Information     Gaynelle Arabian, MD. Go on 03/28/2021.   Specialty: Orthopedic Surgery Why: You are scheduled for first post op appointment on Tuesday January 17th at 2:00pm. Contact information: [address] [place] [address] [phone]                 Signed: Fenton Foy, MBA, PA-C Orthopedic Surgery 03/26/2021, 8:55 PM

## 2021-04-03 ENCOUNTER — Other Ambulatory Visit: Payer: Self-pay

## 2021-04-03 ENCOUNTER — Encounter (HOSPITAL_BASED_OUTPATIENT_CLINIC_OR_DEPARTMENT_OTHER): Payer: Self-pay | Admitting: Cardiology

## 2021-04-03 ENCOUNTER — Ambulatory Visit (INDEPENDENT_AMBULATORY_CARE_PROVIDER_SITE_OTHER): Payer: Medicare Other | Admitting: Cardiology

## 2021-04-03 VITALS — BP 122/60 | HR 76 | Ht 73.0 in | Wt 223.2 lb

## 2021-04-03 DIAGNOSIS — Z7901 Long term (current) use of anticoagulants: Secondary | ICD-10-CM

## 2021-04-03 DIAGNOSIS — I10 Essential (primary) hypertension: Secondary | ICD-10-CM | POA: Diagnosis not present

## 2021-04-03 DIAGNOSIS — I77810 Thoracic aortic ectasia: Secondary | ICD-10-CM | POA: Diagnosis not present

## 2021-04-03 DIAGNOSIS — I480 Paroxysmal atrial fibrillation: Secondary | ICD-10-CM

## 2021-04-03 DIAGNOSIS — Z7189 Other specified counseling: Secondary | ICD-10-CM | POA: Diagnosis not present

## 2021-04-03 DIAGNOSIS — D6869 Other thrombophilia: Secondary | ICD-10-CM

## 2021-04-03 DIAGNOSIS — I350 Nonrheumatic aortic (valve) stenosis: Secondary | ICD-10-CM | POA: Diagnosis not present

## 2021-04-03 NOTE — Patient Instructions (Signed)
Medication Instructions:  Your Physician recommend you continue on your current medication as directed.    *If you need a refill on your cardiac medications before your next appointment, please call your pharmacy*   Lab Work: None ordered today   Testing/Procedures: Your physician has requested that you have an echocardiogram in July, 2024. Echocardiography is a painless test that uses sound waves to create images of your heart. It provides your doctor with information about the size and shape of your heart and how well your hearts chambers and valves are working. This procedure takes approximately one hour. There are no restrictions for this procedure. [place], you and your health needs are our priority.  As part of our continuing mission to provide you with exceptional heart care, we have created designated Provider Care Teams.  These Care Teams include your primary Cardiologist (physician) and Advanced Practice Providers (APPs -  Physician Assistants and Nurse Practitioners) who all work together to provide you with the care you need, when you need it.  We recommend signing up for the patient portal called "MyChart".  Sign up information is provided on this After Visit Summary.  MyChart is used to connect with patients for Virtual Visits (Telemedicine).  Patients are able to view lab/test results, encounter notes, upcoming appointments, etc.  Non-urgent messages can be sent to your provider as well.   To learn more about what you can do with MyChart, go to [email or web address].    Your next appointment:   July, 2024  The format for your next appointment:   In Person  Provider:   Buford Dresser, MD

## 2021-04-03 NOTE — Progress Notes (Signed)
Cardiology Office Note:    Date:  04/03/2021   ID:  Steven Shepard, DOB [birth year], MRN [id number]  PCP:  Vivi Barrack, MD  Cardiologist:  Buford Dresser, MD  Referring MD: Vivi Barrack, MD   CC: follow-up   History of Present Illness:    Steven Shepard is a 76 y.o. male with a hx of paroxysmal atrial fibrillation, hemolytic anemia, aortic stenosis, cold agglutinin disease, COPD, hyperlipidemia, hypertension, OSA on CPAP, and pre-diabetes, who is seen for follow-up. He was previously a patient of Dr. Meda Coffee and Ermalinda Barrios.  Family history: His father (66 yo) and mother (27 yo) died of heart attacks. Prior cardiac testing and/or incidental findings on other testing (ie coronary calcium): Heart Cath (20-30 yrs ago), no stents Exercise level: Regularly walks his dog. Reports mild chest pain and shortness of breath due to deconditioning.  Today: Doing well post hip surgery 03/15/21. Doing PT at home.   Has had several very brief palpitations, goes away with taking deep breaths.  No significant sensations perioperatively. Tolerating rivaroxaban.   Denies chest pain, shortness of breath at rest or with normal exertion. No PND, orthopnea, LE edema or unexpected weight gain. No syncope.  Past Medical History:  Diagnosis Date   Anemia, hemolytic ([place]) 01/22/2011   Aortic stenosis    Arthritis    Cancer (HCC)    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[place]) 01/23/2011   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[place]) 03/23/2020   COPD (chronic obstructive pulmonary disease) (HCC)    DOE (dyspnea on exertion)    Dysrhythmia    Elevated bilirubin    Fatty liver    Heart murmur    History of neck surgery    Hyperlipidemia    Hypertension    Mild dilation of ascending aorta (HCC)    OSA on CPAP    PAF (paroxysmal atrial fibrillation) ([place])    Pre-diabetes    Umbilical hernia     Past Surgical History:  Procedure Laterality Date   ADENOIDECTOMY     HIATAL HERNIA REPAIR  2011   NECK  SURGERY  2005   SKIN SURGERY     skin cancer   TONSILLECTOMY     TOTAL HIP ARTHROPLASTY Right 09/18/2010   Dr. Maureen Ralphs   TOTAL HIP ARTHROPLASTY Left 03/15/2021   Procedure: TOTAL HIP ARTHROPLASTY ANTERIOR APPROACH;  Surgeon: Gaynelle Arabian, MD;  Location: [facility];  Service: Orthopedics;  Laterality: Left;    Current Medications: Current Outpatient Medications on File Prior to Visit  Medication Sig   acetaminophen (TYLENOL) 650 MG CR tablet Take 650 mg by mouth 2 (two) times daily as needed for pain.   amLODipine (NORVASC) 10 MG tablet Take 1 tablet (10 mg total) by mouth daily. (Patient taking differently: Take 10 mg by mouth in the morning.)   atorvastatin (LIPITOR) 20 MG tablet TAKE 1 TABLET(20 MG) BY MOUTH DAILY (Patient taking differently: Take 20 mg by mouth daily after supper.)   carvedilol (COREG) 12.5 MG tablet Take 1 tablet (12.5 mg total) by mouth 2 (two) times daily.   diphenhydrAMINE-APAP, sleep, (TYLENOL PM EXTRA STRENGTH PO) Take 1 tablet by mouth at bedtime.   fexofenadine (ALLEGRA) 180 MG tablet Take 180 mg by mouth in the morning.   folic acid (FOLVITE) 967 MCG tablet Take 800 mcg by mouth 2 (two) times daily.   furosemide (LASIX) 40 MG tablet TAKE 1 TABLET BY MOUTH TWICE DAILY AT 8 AM AND AT 2 PM (Patient taking differently: Take 40 mg by mouth See  admin instructions. Take 40 mg by mouth at 8 AM and 2 PM)   Krill Oil 500 MG CAPS Take 500 mg by mouth daily with supper.   metFORMIN (GLUCOPHAGE) 1000 MG tablet Take 500 mg by mouth in the morning.   methocarbamol (ROBAXIN) 500 MG tablet Take 1 tablet (500 mg total) by mouth every 6 (six) hours as needed for muscle spasms.   Misc Natural Product Nasal (NASAL CLEANSE RINSE MIX NA) Place into the nose in the morning.   Multiple Vitamins-Minerals (EMERGEN-C IMMUNE PO) Take 1 packet by mouth See admin instructions. Mix 1 packet into 4-8 ounces of water with one well-rounded teaspoonful of Metamucil powder and drink every morning    Multiple Vitamins-Minerals (ONE-A-DAY MENS 50+) TABS Take 1 tablet by mouth daily with breakfast.   potassium chloride SA (KLOR-CON) 20 MEQ tablet Take 1 tablet (20 mEq total) by mouth 2 (two) times daily. (Patient taking differently: Take 20 mEq by mouth See admin instructions. Take 20 mEq by mouth at 8 AM and 2 PM)   PRESCRIPTION MEDICATION CPAP- At bedtime and during any time of rest   Psyllium (METAMUCIL FIBER PO) Take by mouth See admin instructions. Mix 1 well-rounded teaspoonful of powder into 4-8 ounces of water with one Emergen-C packet and drink every morning   rivaroxaban (XARELTO) 20 MG TABS tablet Take 1 tablet (20 mg total) by mouth daily with supper.   tamsulosin (FLOMAX) 0.4 MG CAPS capsule Take 1 capsule (0.4 mg total) by mouth daily. (Patient taking differently: Take 0.4 mg by mouth daily after supper.)   traMADol (ULTRAM) 50 MG tablet Take 1-2 tablets (50-100 mg total) by mouth every 6 (six) hours as needed for moderate pain.   No current facility-administered medications on file prior to visit.     Allergies:   Ace inhibitors, Codeine, and Spironolactone   Social History   Tobacco Use   Smoking status: Former    Packs/day: 1.50    Years: 3.00    Pack years: 4.50    Types: Cigarettes, Pipe    Quit date: 03/12/1973    Years since quitting: 48.0   Smokeless tobacco: Never   Tobacco comments:    quit appox 36 years ago  Vaping Use   Vaping Use: Never used  Substance Use Topics   Alcohol use: Yes    Comment: occasionally, 1-7 drinks per week   Drug use: No    Family History: family history includes Cancer in his brother; Coronary artery disease in his father and mother; Diabetes in his brother and sister; Heart attack in his father and mother; Hypertension in his father, mother, and sister. There is no history of Colon cancer or Colonic polyp.  ROS:   Please see the history of present illness. All other systems are reviewed and negative.    EKGs/Labs/Other  Studies Reviewed:    The following studies were reviewed today:  Echo 09/21/2020:  1. Left ventricular ejection fraction, by estimation, is 60 to 65%. The  left ventricle has normal function. The left ventricle has no regional  wall motion abnormalities. There is mild concentric left ventricular  hypertrophy. Left ventricular diastolic  parameters are consistent with Grade I diastolic dysfunction (impaired  relaxation).   2. Right ventricular systolic function is normal. The right ventricular  size is normal. Tricuspid regurgitation signal is inadequate for assessing  PA pressure.   3. Left atrial size was mildly dilated.   4. The mitral valve is normal in structure. No evidence  of mitral valve  regurgitation. No evidence of mitral stenosis.   5. The aortic valve is calcified. There is moderate calcification of the  aortic valve. There is moderate thickening of the aortic valve. Aortic  valve regurgitation is not visualized. Moderate aortic valve stenosis.  Aortic valve area, by VTI measures  1.16 cm. Aortic valve mean gradient measures 20.0 mmHg. Aortic valve Vmax  measures 3.18 m/s.   6. Aortic dilatation noted. There is mild dilatation of the aortic root,  measuring 43 mm. There is mild dilatation of the ascending aorta,  measuring 40 mm.   7. The inferior vena cava is dilated in size with <50% respiratory  variability, suggesting right atrial pressure of 15 mmHg.   Comparison(s): Compared to prior TTE in 09/2019, there is stable moderate  AS (previous mean gradient 28mHg; now 241mg). The ascending aorta  remains stable in size at 4080m  EKG:  EKG is personally reviewed.   04/03/21: NSR at 76 bpm 12/01/2020: not ordered 10/19/2020: sinus bradycardia at 55 bpm with 1st degree AV block  Recent Labs: 01/18/2021: TSH 3.49 03/08/2021: ALT 20 03/16/2021: BUN 22; Creatinine, Ser 0.86; Hemoglobin 10.7; Platelets 180; Potassium 3.7; Sodium 138   Recent Lipid Panel    Component  Value Date/Time   CHOL 133 01/18/2021 0818   CHOL 136 01/11/2020 0813   TRIG 129.0 01/18/2021 0818   HDL 39.50 01/18/2021 0818   HDL 45 01/11/2020 0813   CHOLHDL 3 01/18/2021 0818   VLDL 25.8 01/18/2021 0818   LDLCALC 67 01/18/2021 0818   LDLCALC 71 01/11/2020 0813    Physical Exam:    VS:  BP 122/60 (BP Location: Left Arm, Patient Position: Sitting, Cuff Size: Large)    Pulse 76    Ht 6' 1"  (1.854 m)    Wt 223 lb 3.2 oz (101.2 kg)    BMI 29.45 kg/m     Wt Readings from Last 3 Encounters:  04/03/21 223 lb 3.2 oz (101.2 kg)  03/15/21 233 lb (105.7 kg)  03/08/21 233 lb (105.7 kg)    GEN: Well nourished, well developed in no acute distress HEENT: Normal, moist mucous membranes NECK: No JVD CARDIAC: regular rhythm, normal S1 and S2, no rubs or gallops. 3/6 early peaking systolic murmur. VASCULAR: Radial and DP pulses 2+ bilaterally. No carotid bruits RESPIRATORY:  Clear to auscultation without rales, wheezing or rhonchi  ABDOMEN: Soft, non-tender, non-distended MUSCULOSKELETAL:  Ambulates independently SKIN: Warm and dry, no edema NEUROLOGIC:  Alert and oriented x 3. No focal neuro deficits noted. PSYCHIATRIC:  Normal affect    ASSESSMENT:    1. Paroxysmal atrial fibrillation (HCC)   2. Moderate aortic stenosis   3. Essential hypertension   4. Mild dilation of ascending aorta (HCC)   5. Long term current use of anticoagulant   6. Secondary hypercoagulable state ([facility] 7. Counseling on health promotion and disease prevention    PLAN:    Paroxysmal atrial fibrillation: -CHA2DS2/VAS Stroke Risk Points=4  -tolerating rivaroxaban -rare, fleeting palpitations -counseled on monitoring for bleeding -continue CPAP for OSA  Moderate aortic stenosis Mild dilation of ascending aorta Elevated ASCVD risk -continue atorvastatin -last echo 09/2020, repeat 09/2022 -reviewed symptoms that need to be reported to me  Hypertension: -at goal today -continue amlodipine 10 mg daily,  carvedilol 12.5 mg daily, furosemide 40 mg daily -reports he cannot take ACEi/MRA due to hyperkalemia, but he is currently on a potassium supplement with his furosemide. Could reassess cautiously in the future if  needed  Cardiac risk counseling and prevention recommendations: -recommend heart healthy/Mediterranean diet, with whole grains, fruits, vegetable, fish, lean meats, nuts, and olive oil. Limit salt. -recommend moderate walking, 3-5 times/week for 30-50 minutes each session. Aim for at least 150 minutes.week. Goal should be pace of 3 miles/hours, or walking 1.5 miles in 30 minutes -recommend avoidance of tobacco products. Avoid excess alcohol. -ASCVD risk score: The 10-year ASCVD risk score (Arnett DK, et al., 2019) is: 40.2%   Values used to calculate the score:     Age: 70 years     Sex: Male     Is Non-Hispanic African American: No     Diabetic: Yes     Tobacco smoker: No     Systolic Blood Pressure: 553 mmHg     Is BP treated: Yes     HDL Cholesterol: 39.5 mg/dL     Total Cholesterol: 133 mg/dL    Plan for follow up: ~16 months (post repeat echo) or sooner as needed.  Buford Dresser, MD, PhD, [facility]    Medication Adjustments/Labs and Tests Ordered: Current medicines are reviewed at length with the patient today.  Concerns regarding medicines are outlined above.   Orders Placed This Encounter  Procedures   EKG 12-Lead   ECHOCARDIOGRAM COMPLETE   No orders of the defined types were placed in this encounter.  Patient Instructions  Medication Instructions:  Your Physician recommend you continue on your current medication as directed.    *If you need a refill on your cardiac medications before your next appointment, please call your pharmacy*   Lab Work: None ordered today   Testing/Procedures: Your physician has requested that you have an echocardiogram in July, 2024. Echocardiography is a painless test that uses sound waves to  create images of your heart. It provides your doctor with information about the size and shape of your heart and how well your hearts chambers and valves are working. This procedure takes approximately one hour. There are no restrictions for this procedure. [place], you and your health needs are our priority.  As part of our continuing mission to provide you with exceptional heart care, we have created designated Provider Care Teams.  These Care Teams include your primary Cardiologist (physician) and Advanced Practice Providers (APPs -  Physician Assistants and Nurse Practitioners) who all work together to provide you with the care you need, when you need it.  We recommend signing up for the patient portal called "MyChart".  Sign up information is provided on this After Visit Summary.  MyChart is used to connect with patients for Virtual Visits (Telemedicine).  Patients are able to view lab/test results, encounter notes, upcoming appointments, etc.  Non-urgent messages can be sent to your provider as well.   To learn more about what you can do with MyChart, go to [email or web address].    Your next appointment:   July, 2024  The format for your next appointment:   In Person  Provider:   Buford Dresser, MD        Signed, Buford Dresser, MD PhD 04/03/2021 9:38 AM    [place]

## 2021-04-19 DIAGNOSIS — Z471 Aftercare following joint replacement surgery: Secondary | ICD-10-CM | POA: Diagnosis not present

## 2021-04-19 DIAGNOSIS — Z4789 Encounter for other orthopedic aftercare: Secondary | ICD-10-CM | POA: Diagnosis not present

## 2021-05-16 DIAGNOSIS — L570 Actinic keratosis: Secondary | ICD-10-CM | POA: Diagnosis not present

## 2021-05-16 DIAGNOSIS — C4441 Basal cell carcinoma of skin of scalp and neck: Secondary | ICD-10-CM | POA: Diagnosis not present

## 2021-05-16 DIAGNOSIS — D485 Neoplasm of uncertain behavior of skin: Secondary | ICD-10-CM | POA: Diagnosis not present

## 2021-05-16 DIAGNOSIS — L905 Scar conditions and fibrosis of skin: Secondary | ICD-10-CM | POA: Diagnosis not present

## 2021-05-16 DIAGNOSIS — C4449 Other specified malignant neoplasm of skin of scalp and neck: Secondary | ICD-10-CM | POA: Diagnosis not present

## 2021-05-16 DIAGNOSIS — Z85828 Personal history of other malignant neoplasm of skin: Secondary | ICD-10-CM | POA: Diagnosis not present

## 2021-05-16 DIAGNOSIS — C4442 Squamous cell carcinoma of skin of scalp and neck: Secondary | ICD-10-CM | POA: Diagnosis not present

## 2021-05-19 ENCOUNTER — Other Ambulatory Visit (HOSPITAL_BASED_OUTPATIENT_CLINIC_OR_DEPARTMENT_OTHER): Payer: Self-pay | Admitting: Family

## 2021-05-30 DIAGNOSIS — Z23 Encounter for immunization: Secondary | ICD-10-CM | POA: Diagnosis not present

## 2021-06-07 ENCOUNTER — Other Ambulatory Visit: Payer: Self-pay | Admitting: Family Medicine

## 2021-06-07 DIAGNOSIS — I480 Paroxysmal atrial fibrillation: Secondary | ICD-10-CM

## 2021-06-07 DIAGNOSIS — I5031 Acute diastolic (congestive) heart failure: Secondary | ICD-10-CM

## 2021-06-17 ENCOUNTER — Other Ambulatory Visit (HOSPITAL_BASED_OUTPATIENT_CLINIC_OR_DEPARTMENT_OTHER): Payer: Self-pay | Admitting: Cardiology

## 2021-06-17 ENCOUNTER — Other Ambulatory Visit: Payer: Self-pay | Admitting: Family Medicine

## 2021-06-19 NOTE — Telephone Encounter (Signed)
Prescription refill request for Xarelto received.  ?Indication:Afib ?Last office visit:1/23 ?Weight:101.2 kg ?Age:76 ?Scr:0.8 ?CrCl:115.96 ml/min ? ?Prescription refilled ? ?

## 2021-08-01 DIAGNOSIS — I10 Essential (primary) hypertension: Secondary | ICD-10-CM | POA: Diagnosis not present

## 2021-08-01 DIAGNOSIS — I4811 Longstanding persistent atrial fibrillation: Secondary | ICD-10-CM | POA: Diagnosis not present

## 2021-08-01 DIAGNOSIS — R7303 Prediabetes: Secondary | ICD-10-CM | POA: Diagnosis not present

## 2021-08-01 DIAGNOSIS — E7800 Pure hypercholesterolemia, unspecified: Secondary | ICD-10-CM | POA: Diagnosis not present

## 2021-08-21 DIAGNOSIS — L814 Other melanin hyperpigmentation: Secondary | ICD-10-CM | POA: Diagnosis not present

## 2021-08-21 DIAGNOSIS — D225 Melanocytic nevi of trunk: Secondary | ICD-10-CM | POA: Diagnosis not present

## 2021-08-21 DIAGNOSIS — D2372 Other benign neoplasm of skin of left lower limb, including hip: Secondary | ICD-10-CM | POA: Diagnosis not present

## 2021-08-21 DIAGNOSIS — D2261 Melanocytic nevi of right upper limb, including shoulder: Secondary | ICD-10-CM | POA: Diagnosis not present

## 2021-08-21 DIAGNOSIS — L821 Other seborrheic keratosis: Secondary | ICD-10-CM | POA: Diagnosis not present

## 2021-08-21 DIAGNOSIS — Z85820 Personal history of malignant melanoma of skin: Secondary | ICD-10-CM | POA: Diagnosis not present

## 2021-08-21 DIAGNOSIS — Z85828 Personal history of other malignant neoplasm of skin: Secondary | ICD-10-CM | POA: Diagnosis not present

## 2021-08-21 DIAGNOSIS — L570 Actinic keratosis: Secondary | ICD-10-CM | POA: Diagnosis not present

## 2021-09-06 ENCOUNTER — Inpatient Hospital Stay: Payer: Medicare Other | Attending: Hematology & Oncology

## 2021-09-06 ENCOUNTER — Inpatient Hospital Stay (HOSPITAL_BASED_OUTPATIENT_CLINIC_OR_DEPARTMENT_OTHER): Payer: Medicare Other | Admitting: Hematology & Oncology

## 2021-09-06 ENCOUNTER — Encounter: Payer: Self-pay | Admitting: Hematology & Oncology

## 2021-09-06 VITALS — BP 138/66 | HR 66 | Temp 98.5°F | Resp 18 | Wt 233.0 lb

## 2021-09-06 DIAGNOSIS — Z7901 Long term (current) use of anticoagulants: Secondary | ICD-10-CM | POA: Diagnosis not present

## 2021-09-06 DIAGNOSIS — Z79899 Other long term (current) drug therapy: Secondary | ICD-10-CM | POA: Diagnosis not present

## 2021-09-06 DIAGNOSIS — D5912 Cold autoimmune hemolytic anemia: Secondary | ICD-10-CM | POA: Insufficient documentation

## 2021-09-06 LAB — CBC WITH DIFFERENTIAL (CANCER CENTER ONLY)
Basophils Absolute: 0.8 10*3/uL — ABNORMAL HIGH (ref 0.0–0.1)
Basophils Relative: 0 %
Eosinophils Absolute: 1 10*3/uL — ABNORMAL HIGH (ref 0.0–0.5)
Eosinophils Relative: 0 %
HCT: 34.1 % — ABNORMAL LOW (ref 39.0–52.0)
Hemoglobin: 11 g/dL — ABNORMAL LOW (ref 13.0–17.0)
Lymphocytes Relative: 2 %
Lymphs Abs: 29.3 10*3/uL — ABNORMAL HIGH (ref 0.7–4.0)
MCH: 30.8 pg (ref 26.0–34.0)
MCHC: 32.3 g/dL (ref 30.0–36.0)
MCV: 95.5 fL (ref 80.0–100.0)
Monocytes Absolute: 9.2 10*3/uL — ABNORMAL HIGH (ref 0.1–1.0)
Monocytes Relative: 1 %
Neutro Abs: 59.1 10*3/uL — ABNORMAL HIGH (ref 1.7–7.7)
Neutrophils Relative %: 4 %
Platelet Count: 181 10*3/uL (ref 150–400)
RBC: 3.57 MIL/uL — ABNORMAL LOW (ref 4.22–5.81)
RDW: 14.9 % (ref 11.5–15.5)
WBC Count: 6.2 10*3/uL (ref 4.0–10.5)
nRBC: 0 % (ref 0.0–0.2)
nRBC: 0 /100 WBC

## 2021-09-06 LAB — CMP (CANCER CENTER ONLY)
ALT: 19 U/L (ref 0–44)
AST: 15 U/L (ref 15–41)
Albumin: 4.1 g/dL (ref 3.5–5.0)
Alkaline Phosphatase: 69 U/L (ref 38–126)
Anion gap: 5 (ref 5–15)
BUN: 16 mg/dL (ref 8–23)
CO2: 31 mmol/L (ref 22–32)
Calcium: 9.4 mg/dL (ref 8.9–10.3)
Chloride: 106 mmol/L (ref 98–111)
Creatinine: 0.84 mg/dL (ref 0.61–1.24)
GFR, Estimated: >60 mL/min (ref >60)
Glucose, Bld: 116 mg/dL — ABNORMAL HIGH (ref 70–99)
Potassium: 4.1 mmol/L (ref 3.5–5.1)
Sodium: 142 mmol/L (ref 135–145)
Total Bilirubin: 2.1 mg/dL — ABNORMAL HIGH (ref 0.3–1.2)
Total Protein: 6.8 g/dL (ref 6.5–8.1)

## 2021-09-06 LAB — RETICULOCYTES
Immature Retic Fract: 20.9 % — ABNORMAL HIGH (ref 2.3–15.9)
RBC.: 3.5 MIL/uL — ABNORMAL LOW (ref 4.22–5.81)
Retic Count, Absolute: 81.2 10*3/uL (ref 19.0–186.0)
Retic Ct Pct: 2.3 % (ref 0.4–3.1)

## 2021-09-06 LAB — LACTATE DEHYDROGENASE: LDH: 130 U/L (ref 98–192)

## 2021-09-06 NOTE — Progress Notes (Signed)
Hematology and Oncology Follow Up Visit  Steven Shepard [id number] [birth year] 76 y.o. 09/06/2021   Principle Diagnosis:  Cold agglutinin disease  Current Therapy:   Folic acid 2 mg p.o. daily      Interim History:  Steven Shepard is  back for another visit.  We see him every 6 months.  He mostly spends time out of [place]  He is going to go down there this weekend.  He is done quite well.  He actually had left hip surgery back in January.  He really looks good.  He had no complications from the hip surgery.  He was in the hospital overnight.  There is been no issues with the actual cold agglutinin disease.  He still has a fairly high titer.  He has had no fatigue or weakness.  He has had no obvious bleeding.  There is been no change in bowel or bladder habits.  He has had no nausea or vomiting.  He continues on Coumadin for the heart valve.  He is being followed by cardiology for this.  Overall, I would say his performance status is probably ECOG 0.     Medications:  Current Outpatient Medications:    acetaminophen (TYLENOL) 650 MG CR tablet, Take 650 mg by mouth 2 (two) times daily as needed for pain., Disp: , Rfl:    amLODipine (NORVASC) 10 MG tablet, TAKE 1 TABLET(10 MG) BY MOUTH DAILY, Disp: 90 tablet, Rfl: 1   atorvastatin (LIPITOR) 20 MG tablet, TAKE 1 TABLET(20 MG) BY MOUTH DAILY (Patient taking differently: Take 20 mg by mouth daily after supper.), Disp: 90 tablet, Rfl: 3   carvedilol (COREG) 12.5 MG tablet, TAKE 1 TABLET(12.5 MG) BY MOUTH TWICE DAILY, Disp: 180 tablet, Rfl: 3   diphenhydrAMINE-APAP, sleep, (TYLENOL PM EXTRA STRENGTH PO), Take 1 tablet by mouth at bedtime., Disp: , Rfl:    fexofenadine (ALLEGRA) 180 MG tablet, Take 180 mg by mouth in the morning., Disp: , Rfl:    folic acid (FOLVITE) 841 MCG tablet, Take 800 mcg by mouth 2 (two) times daily., Disp: , Rfl:    furosemide (LASIX) 40 MG tablet, TAKE 1 TABLET BY MOUTH TWICE DAILY AT 8  AM AND AT 2 PM, Disp: 180 tablet, Rfl: 1   Krill Oil 500 MG CAPS, Take 500 mg by mouth daily with supper., Disp: , Rfl:    metFORMIN (GLUCOPHAGE) 1000 MG tablet, Take 500 mg by mouth in the morning., Disp: , Rfl:    methocarbamol (ROBAXIN) 500 MG tablet, Take 1 tablet (500 mg total) by mouth every 6 (six) hours as needed for muscle spasms., Disp: 40 tablet, Rfl: 0   Misc Natural Product Nasal (NASAL CLEANSE RINSE MIX NA), Place into the nose in the morning., Disp: , Rfl:    Multiple Vitamins-Minerals (EMERGEN-C IMMUNE PO), Take 1 packet by mouth See admin instructions. Mix 1 packet into 4-8 ounces of water with one well-rounded teaspoonful of Metamucil powder and drink every morning, Disp: , Rfl:    Multiple Vitamins-Minerals (ONE-A-DAY MENS 50+) TABS, Take 1 tablet by mouth daily with breakfast., Disp: , Rfl:    potassium chloride SA (KLOR-CON) 20 MEQ tablet, Take 1 tablet (20 mEq total) by mouth 2 (two) times daily. (Patient taking differently: Take 20 mEq by mouth See admin instructions. Take 20 mEq by mouth at 8 AM and 2 PM), Disp: 180 tablet, Rfl: 3   PRESCRIPTION MEDICATION, CPAP- At bedtime and during any time of rest,  Disp: , Rfl:    Psyllium (METAMUCIL FIBER PO), Take by mouth See admin instructions. Mix 1 well-rounded teaspoonful of powder into 4-8 ounces of water with one Emergen-C packet and drink every morning, Disp: , Rfl:    tamsulosin (FLOMAX) 0.4 MG CAPS capsule, TAKE 1 CAPSULE(0.4 MG) BY MOUTH DAILY, Disp: 90 capsule, Rfl: 1   traMADol (ULTRAM) 50 MG tablet, Take 1-2 tablets (50-100 mg total) by mouth every 6 (six) hours as needed for moderate pain., Disp: 40 tablet, Rfl: 0   XARELTO 20 MG TABS tablet, TAKE 1 TABLET(20 MG) BY MOUTH DAILY WITH SUPPER, Disp: 90 tablet, Rfl: 3  Allergies:  Allergies  Allergen Reactions   Ace Inhibitors Other (See Comments)    High K+    Codeine Other (See Comments)    Hallucinations   Spironolactone Other (See Comments)    Increases potassium     Past Medical History, Surgical history, Social history, and Family History were reviewed and updated.  Review of Systems: Review of Systems  Constitutional: Negative.   HENT:  Negative.    Eyes: Negative.   Respiratory: Negative.    Cardiovascular: Negative.   Gastrointestinal: Negative.   Endocrine: Negative.   Genitourinary: Negative.    Musculoskeletal: Negative.   Skin: Negative.   Neurological: Negative.   Hematological: Negative.   Psychiatric/Behavioral: Negative.      Physical Exam:  weight is 233 lb (105.7 kg). His oral temperature is 98.5 F (36.9 C). His blood pressure is 138/66 and his pulse is 66. His respiration is 18 and oxygen saturation is 98%.   Wt Readings from Last 3 Encounters:  09/06/21 233 lb (105.7 kg)  04/03/21 223 lb 3.2 oz (101.2 kg)  03/15/21 233 lb (105.7 kg)    Physical Exam Vitals reviewed.  HENT:     Head: Normocephalic and atraumatic.  Eyes:     Pupils: Pupils are equal, round, and reactive to light.  Cardiovascular:     Rate and Rhythm: Normal rate and regular rhythm.     Heart sounds: Normal heart sounds.  Pulmonary:     Effort: Pulmonary effort is normal.     Breath sounds: Normal breath sounds.  Abdominal:     General: Bowel sounds are normal.     Palpations: Abdomen is soft.  Musculoskeletal:        General: No tenderness or deformity. Normal range of motion.     Cervical back: Normal range of motion.  Lymphadenopathy:     Cervical: No cervical adenopathy.  Skin:    General: Skin is warm and dry.     Findings: No erythema or rash.  Neurological:     Mental Status: He is alert and oriented to person, place, and time.  Psychiatric:        Behavior: Behavior normal.        Thought Content: Thought content normal.        Judgment: Judgment normal.      Lab Results  Component Value Date   WBC 6.2 09/06/2021   HGB 11.0 (L) 09/06/2021   HCT 34.1 (L) 09/06/2021   MCV 95.5 09/06/2021   PLT 181 09/06/2021      Chemistry      Component Value Date/Time   NA 142 09/06/2021 1147   K 4.1 09/06/2021 1147   CL 106 09/06/2021 1147   CO2 31 09/06/2021 1147   BUN 16 09/06/2021 1147   CREATININE 0.84 09/06/2021 1147   CREATININE 0.89 01/13/2020 0906  Component Value Date/Time   CALCIUM 9.4 09/06/2021 1147   ALKPHOS 69 09/06/2021 1147   AST 15 09/06/2021 1147   ALT 19 09/06/2021 1147   BILITOT 2.1 (H) 09/06/2021 1147      Impression and Plan: Mr. Bromwell is a very nice 76 year old white male.  He has cold agglutinin disease.  As far as I can tell, this is idiopathic.  There is no associated lymphoproliferative disease or autoimmune disease.  Will be interesting to see what his cold agglutinin titer is.  His hemoglobin is holding steady.  He is on folic acid.  For right now, there really is not much that we have to do for him.  We do not have to entertain anything more aggressive with the cold agglutinin disease since his hemoglobin is holding steady.  We will plan for another 65-monthfollow-up.  Usually, during the summertime, his blood count does better because of the warm weather.    PVolanda Napoleon MD 6/28/20231:22 PM

## 2021-09-07 LAB — COLD AGGLUTININ TITER: Cold Agglutinin Titer: >1:4096 {titer}

## 2021-09-11 ENCOUNTER — Other Ambulatory Visit: Payer: Self-pay | Admitting: *Deleted

## 2021-09-11 DIAGNOSIS — I480 Paroxysmal atrial fibrillation: Secondary | ICD-10-CM

## 2021-09-11 DIAGNOSIS — I5031 Acute diastolic (congestive) heart failure: Secondary | ICD-10-CM

## 2021-09-13 ENCOUNTER — Telehealth (HOSPITAL_BASED_OUTPATIENT_CLINIC_OR_DEPARTMENT_OTHER): Payer: Self-pay

## 2021-09-13 DIAGNOSIS — E7849 Other hyperlipidemia: Secondary | ICD-10-CM

## 2021-09-13 DIAGNOSIS — I110 Hypertensive heart disease with heart failure: Secondary | ICD-10-CM

## 2021-09-13 MED ORDER — POTASSIUM CHLORIDE CRYS ER 20 MEQ PO TBCR: 20.0000 meq | EXTENDED_RELEASE_TABLET | ORAL | 0 refills | Status: DC

## 2021-09-13 NOTE — Telephone Encounter (Signed)
Received fax from [facility]) requesting refills for Potassium 20 mEq. Rx request sent to pharmacy.

## 2021-09-15 ENCOUNTER — Other Ambulatory Visit: Payer: Self-pay | Admitting: *Deleted

## 2021-09-15 DIAGNOSIS — I5031 Acute diastolic (congestive) heart failure: Secondary | ICD-10-CM

## 2021-09-15 DIAGNOSIS — I480 Paroxysmal atrial fibrillation: Secondary | ICD-10-CM

## 2021-09-15 MED ORDER — FUROSEMIDE 40 MG PO TABS: ORAL_TABLET | ORAL | 0 refills | Status: DC

## 2021-09-22 ENCOUNTER — Other Ambulatory Visit (HOSPITAL_BASED_OUTPATIENT_CLINIC_OR_DEPARTMENT_OTHER): Payer: Medicare Other

## 2021-09-28 ENCOUNTER — Encounter (HOSPITAL_BASED_OUTPATIENT_CLINIC_OR_DEPARTMENT_OTHER): Payer: Self-pay

## 2021-10-25 ENCOUNTER — Encounter: Payer: Self-pay | Admitting: Family Medicine

## 2021-10-30 ENCOUNTER — Other Ambulatory Visit: Payer: Self-pay | Admitting: Family Medicine

## 2021-10-30 ENCOUNTER — Other Ambulatory Visit: Payer: Self-pay | Admitting: *Deleted

## 2021-10-30 MED ORDER — AMLODIPINE BESYLATE 10 MG PO TABS
ORAL_TABLET | ORAL | 1 refills | Status: DC
Start: 2021-10-30 — End: 2022-08-03

## 2021-10-30 MED ORDER — METFORMIN HCL 1000 MG PO TABS: 500.0000 mg | ORAL_TABLET | Freq: Every morning | ORAL | 3 refills | Status: DC

## 2021-10-30 MED ORDER — METFORMIN HCL 500 MG PO TABS: 500.0000 mg | ORAL_TABLET | Freq: Every day | ORAL | 0 refills | Status: DC

## 2021-10-30 NOTE — Telephone Encounter (Signed)
Last refill done by historical provider

## 2021-10-30 NOTE — Telephone Encounter (Signed)
Patient will be out of medications- will be out of town this week. Needs medication refills by this week.

## 2021-11-08 ENCOUNTER — Telehealth: Payer: Self-pay | Admitting: Family Medicine

## 2021-11-08 NOTE — Telephone Encounter (Signed)
Patient states the CPAP Mask is a Programmer, multimedia Medium

## 2021-11-08 NOTE — Telephone Encounter (Signed)
Patient has called back into the office. Patient was upset that script for CPAP was not handled today and that it was too late to pick up at [facility] if sent this afternoon due to their closing hours.  I have advised patient that we do request 3 business days on scripts.  Also, I have inquired if patient has reached out to pulmonary.  Patient stated he had not.  I had consulted with Dr. Cherlynn Kaiser.  She had offered to send a [facility] to a [facility] for patient given that he would not be able to pick up mask at [facility].  Patient states he would give office a call back.    Please follow up with patient in regard in the morning.

## 2021-11-08 NOTE — Telephone Encounter (Signed)
Pt states: -CPAP mask is not working/malfunctioning. -Needs a new one asap.  Pt requests: -prescription for new mask only to be sent to the location listed below.  -call when prescription is sent.    [facility] Fax: [phone]

## 2021-11-08 NOTE — Telephone Encounter (Signed)
Patient states: -He needs this medication needs to be sent asap.  - Pharmacy closes at 4pm  Patient requests: -A callback once completed

## 2021-11-09 NOTE — Telephone Encounter (Signed)
Pulmonology is managing his CPAP and they are the ones that sent in his rx previously. Recommend that he follow up with them.

## 2021-11-09 NOTE — Telephone Encounter (Signed)
Returned patient call this morning to advise Dr Jerline Pain recommendations and pt stated he has already got it taken care of and appreciates the call back. Still advised in future he will need to f/u with pulmonology for CPAP needs and pt verbalized understanding

## 2021-11-21 DIAGNOSIS — L821 Other seborrheic keratosis: Secondary | ICD-10-CM | POA: Diagnosis not present

## 2021-11-21 DIAGNOSIS — D692 Other nonthrombocytopenic purpura: Secondary | ICD-10-CM | POA: Diagnosis not present

## 2021-11-21 DIAGNOSIS — Z85828 Personal history of other malignant neoplasm of skin: Secondary | ICD-10-CM | POA: Diagnosis not present

## 2021-11-21 DIAGNOSIS — L910 Hypertrophic scar: Secondary | ICD-10-CM | POA: Diagnosis not present

## 2021-11-21 DIAGNOSIS — D2272 Melanocytic nevi of left lower limb, including hip: Secondary | ICD-10-CM | POA: Diagnosis not present

## 2021-11-21 DIAGNOSIS — D2261 Melanocytic nevi of right upper limb, including shoulder: Secondary | ICD-10-CM | POA: Diagnosis not present

## 2021-11-21 DIAGNOSIS — L570 Actinic keratosis: Secondary | ICD-10-CM | POA: Diagnosis not present

## 2021-11-29 DIAGNOSIS — H2513 Age-related nuclear cataract, bilateral: Secondary | ICD-10-CM | POA: Diagnosis not present

## 2021-11-29 DIAGNOSIS — H5213 Myopia, bilateral: Secondary | ICD-10-CM | POA: Diagnosis not present

## 2021-11-29 LAB — HM DIABETES EYE EXAM

## 2021-12-04 ENCOUNTER — Encounter: Payer: Self-pay | Admitting: Family Medicine

## 2021-12-04 ENCOUNTER — Encounter: Payer: Self-pay | Admitting: *Deleted

## 2021-12-08 ENCOUNTER — Other Ambulatory Visit: Payer: Self-pay | Admitting: Family Medicine

## 2021-12-08 DIAGNOSIS — I480 Paroxysmal atrial fibrillation: Secondary | ICD-10-CM

## 2021-12-08 DIAGNOSIS — I5031 Acute diastolic (congestive) heart failure: Secondary | ICD-10-CM

## 2021-12-23 ENCOUNTER — Encounter (HOSPITAL_BASED_OUTPATIENT_CLINIC_OR_DEPARTMENT_OTHER): Payer: Self-pay

## 2021-12-23 DIAGNOSIS — E7849 Other hyperlipidemia: Secondary | ICD-10-CM

## 2021-12-23 DIAGNOSIS — I110 Hypertensive heart disease with heart failure: Secondary | ICD-10-CM

## 2021-12-25 MED ORDER — POTASSIUM CHLORIDE CRYS ER 20 MEQ PO TBCR: 20.0000 meq | EXTENDED_RELEASE_TABLET | ORAL | 3 refills | Status: DC

## 2022-01-28 ENCOUNTER — Other Ambulatory Visit: Payer: Self-pay | Admitting: Family Medicine

## 2022-01-29 ENCOUNTER — Other Ambulatory Visit: Payer: Self-pay | Admitting: *Deleted

## 2022-01-29 ENCOUNTER — Encounter: Payer: Self-pay | Admitting: Family Medicine

## 2022-01-29 ENCOUNTER — Other Ambulatory Visit: Payer: Self-pay | Admitting: Family Medicine

## 2022-01-29 MED ORDER — METFORMIN HCL 500 MG PO TABS: 500.0000 mg | ORAL_TABLET | Freq: Every day | ORAL | 0 refills | Status: DC

## 2022-01-29 NOTE — Telephone Encounter (Signed)
Spoke with patient, notified need OV  Patient schedule OV, refill send

## 2022-02-05 ENCOUNTER — Other Ambulatory Visit: Payer: Medicare Other

## 2022-02-05 ENCOUNTER — Ambulatory Visit (INDEPENDENT_AMBULATORY_CARE_PROVIDER_SITE_OTHER): Payer: Medicare Other | Admitting: Family Medicine

## 2022-02-05 ENCOUNTER — Encounter: Payer: Self-pay | Admitting: Family Medicine

## 2022-02-05 VITALS — BP 139/75 | HR 63 | Temp 98.6°F | Ht 73.0 in | Wt 233.4 lb

## 2022-02-05 DIAGNOSIS — R7303 Prediabetes: Secondary | ICD-10-CM

## 2022-02-05 DIAGNOSIS — Z1211 Encounter for screening for malignant neoplasm of colon: Secondary | ICD-10-CM | POA: Diagnosis not present

## 2022-02-05 DIAGNOSIS — N400 Enlarged prostate without lower urinary tract symptoms: Secondary | ICD-10-CM

## 2022-02-05 DIAGNOSIS — I480 Paroxysmal atrial fibrillation: Secondary | ICD-10-CM | POA: Diagnosis not present

## 2022-02-05 DIAGNOSIS — E782 Mixed hyperlipidemia: Secondary | ICD-10-CM | POA: Diagnosis not present

## 2022-02-05 DIAGNOSIS — J449 Chronic obstructive pulmonary disease, unspecified: Secondary | ICD-10-CM | POA: Diagnosis not present

## 2022-02-05 DIAGNOSIS — I10 Essential (primary) hypertension: Secondary | ICD-10-CM

## 2022-02-05 DIAGNOSIS — Z23 Encounter for immunization: Secondary | ICD-10-CM

## 2022-02-05 LAB — TSH: TSH: 4.66 u[IU]/mL (ref 0.35–5.50)

## 2022-02-05 LAB — LIPID PANEL
Cholesterol: 139 mg/dL (ref 0–200)
HDL: 45.4 mg/dL (ref >39.00)
LDL Cholesterol: 72 mg/dL (ref 0–99)
NonHDL: 93.81
Total CHOL/HDL Ratio: 3
Triglycerides: 111 mg/dL (ref 0.0–149.0)
VLDL: 22.2 mg/dL (ref 0.0–40.0)

## 2022-02-05 LAB — COMPREHENSIVE METABOLIC PANEL
ALT: 24 U/L (ref 0–53)
AST: 22 U/L (ref 0–37)
Albumin: 4.2 g/dL (ref 3.5–5.2)
Alkaline Phosphatase: 73 U/L (ref 39–117)
BUN: 15 mg/dL (ref 6–23)
CO2: 31 mEq/L (ref 19–32)
Calcium: 8.8 mg/dL (ref 8.4–10.5)
Chloride: 99 mEq/L (ref 96–112)
Creatinine, Ser: 0.91 mg/dL (ref 0.40–1.50)
GFR: 82.75 mL/min (ref >60.00)
Glucose, Bld: 103 mg/dL — ABNORMAL HIGH (ref 70–99)
Potassium: 3.6 mEq/L (ref 3.5–5.1)
Sodium: 138 mEq/L (ref 135–145)
Total Bilirubin: 2.7 mg/dL — ABNORMAL HIGH (ref 0.2–1.2)
Total Protein: 6.7 g/dL (ref 6.0–8.3)

## 2022-02-05 LAB — PSA: PSA: 1.15 ng/mL (ref 0.10–4.00)

## 2022-02-05 MED ORDER — METFORMIN HCL 500 MG PO TABS: 500.0000 mg | ORAL_TABLET | Freq: Every day | ORAL | 3 refills | Status: DC

## 2022-02-05 NOTE — Assessment & Plan Note (Signed)
Check lipids.  On Lipitor 20 mg daily.

## 2022-02-05 NOTE — Progress Notes (Signed)
   Nuri Larmer is a 76 y.o. male who presents today for an office visit.  Assessment/Plan:  Chronic Problems Addressed Today: Prediabetes Check a1C.  On metformin 500 mg daily.  Hyperlipidemia Check lipids.  On Lipitor 20 mg daily.  Essential hypertension Blood pressure at goal on amlodipine 10 mg daily and Coreg 12.5 mg twice daily.  Chronic obstructive pulmonary disease (HCC) Stable without meds.  Paroxysmal atrial fibrillation (HCC) Anticoagulated on Xarelto rate controlled on Coreg 12.5 mg twice daily.  Regular rate and rhythm today.  BPH (benign prostatic hyperplasia) On Flomax 0.4 mg daily.  Check PSA.  Cologuard ordered.  Flu shot given today.    Subjective:  HPI:  See A/p for status of chronic conditions.  No acute concerns today.        Objective:  Physical Exam: BP 139/75   Pulse 63   Temp 98.6 F (37 C) (Temporal)   Ht 6' 1"  (1.854 m)   Wt 233 lb 6.4 oz (105.9 kg)   SpO2 97%   BMI 30.79 kg/m   Gen: No acute distress, resting comfortably CV: Regular rate and rhythm with 2/6 systolic murmur appreciated Pulm: Normal work of breathing, clear to auscultation bilaterally with no crackles, wheezes, or rhonchi Neuro: Grossly normal, moves all extremities Psych: Normal affect and thought content      Emanuella Nickle M. Jerline Pain, MD 02/05/2022 8:02 AM

## 2022-02-05 NOTE — Patient Instructions (Signed)
It was very nice to see you today!  Keep up the good work!  We will give your flu shot today.  We will order Cologuard.  We will check blood work today.  Please come back to see me in 1 year for your next check up.  Come back sooner if needed.  Take care, Dr Jerline Pain  PLEASE NOTE:  If you had any lab tests please let us know if you have not heard back within a few days. You may see your results on mychart before we have a chance to review them but we will give you a call once they are reviewed by [place]. If we ordered any referrals today, please let us know if you have not heard from their office within the next week.   Please try these tips to maintain a healthy lifestyle:  Eat at least 3 REAL meals and 1-2 snacks per day.  Aim for no more than 5 hours between eating.  If you eat breakfast, please do so within one hour of getting up.   Each meal should contain half fruits/vegetables, one quarter protein, and one quarter carbs (no bigger than a computer mouse)  Cut down on sweet beverages. This includes juice, soda, and sweet tea.   Drink at least 1 glass of water with each meal and aim for at least 8 glasses per day  Exercise at least 150 minutes every week.

## 2022-02-05 NOTE — Assessment & Plan Note (Signed)
On Flomax 0.4 mg daily.  Check PSA.

## 2022-02-05 NOTE — Assessment & Plan Note (Signed)
Check a1C.  On metformin 500 mg daily.

## 2022-02-05 NOTE — Assessment & Plan Note (Signed)
Anticoagulated on Xarelto rate controlled on Coreg 12.5 mg twice daily.  Regular rate and rhythm today.

## 2022-02-05 NOTE — Assessment & Plan Note (Signed)
Stable without meds.

## 2022-02-05 NOTE — Assessment & Plan Note (Signed)
Blood pressure at goal on amlodipine 10 mg daily and Coreg 12.5 mg twice daily.

## 2022-02-06 LAB — CBC WITH DIFFERENTIAL/PLATELET
Absolute Monocytes: 537 cells/uL (ref 200–950)
Basophils Absolute: 47 cells/uL (ref 0–200)
Basophils Relative: 0.8 %
Eosinophils Absolute: 89 cells/uL (ref 15–500)
Eosinophils Relative: 1.5 %
HCT: 30.2 % — ABNORMAL LOW (ref 38.5–50.0)
Hemoglobin: 10.4 g/dL — ABNORMAL LOW (ref 13.2–17.1)
Lymphs Abs: 1994 cells/uL (ref 850–3900)
MCH: 31.6 pg (ref 27.0–33.0)
MCHC: 34.4 g/dL (ref 32.0–36.0)
MCV: 91.8 fL (ref 80.0–100.0)
MPV: 10.8 fL (ref 7.5–12.5)
Monocytes Relative: 9.1 %
Neutro Abs: 3233 cells/uL (ref 1500–7800)
Neutrophils Relative %: 54.8 %
Platelets: 211 10*3/uL (ref 140–400)
RBC: 3.29 10*6/uL — ABNORMAL LOW (ref 4.20–5.80)
RDW: 13.9 % (ref 11.0–15.0)
Total Lymphocyte: 33.8 %
WBC: 5.9 10*3/uL (ref 3.8–10.8)

## 2022-02-06 LAB — HEMOGLOBIN A1C
Hgb A1c MFr Bld: 5.9 % of total Hgb — ABNORMAL HIGH (ref <5.7)
Mean Plasma Glucose: 123 mg/dL
eAG (mmol/L): 6.8 mmol/L

## 2022-02-07 NOTE — Progress Notes (Signed)
Please inform patient of the following:  Blood sugar borderline elevated but stable.  His blood counts are low but stable compared to previous values.  Everything else is normal.  Do not need to make any changes to treatment plan at this time.  He should continue to work on diet and exercise and we can recheck in a year.

## 2022-02-08 ENCOUNTER — Telehealth (HOSPITAL_BASED_OUTPATIENT_CLINIC_OR_DEPARTMENT_OTHER): Payer: Self-pay

## 2022-02-08 NOTE — Telephone Encounter (Signed)
Left message call office to schedule appt with alva

## 2022-02-12 DIAGNOSIS — Z0000 Encounter for general adult medical examination without abnormal findings: Secondary | ICD-10-CM | POA: Diagnosis not present

## 2022-02-12 DIAGNOSIS — Z1211 Encounter for screening for malignant neoplasm of colon: Secondary | ICD-10-CM | POA: Diagnosis not present

## 2022-02-15 ENCOUNTER — Encounter (HOSPITAL_BASED_OUTPATIENT_CLINIC_OR_DEPARTMENT_OTHER): Payer: Self-pay | Admitting: Pulmonary Disease

## 2022-02-15 ENCOUNTER — Ambulatory Visit (INDEPENDENT_AMBULATORY_CARE_PROVIDER_SITE_OTHER): Payer: Medicare Other | Admitting: Pulmonary Disease

## 2022-02-15 VITALS — BP 128/64 | HR 63 | Temp 98.0°F | Ht 73.0 in | Wt 234.4 lb

## 2022-02-15 DIAGNOSIS — J449 Chronic obstructive pulmonary disease, unspecified: Secondary | ICD-10-CM | POA: Diagnosis not present

## 2022-02-15 DIAGNOSIS — G4733 Obstructive sleep apnea (adult) (pediatric): Secondary | ICD-10-CM | POA: Diagnosis not present

## 2022-02-15 NOTE — Patient Instructions (Signed)
CPAP is working well on current auto settings

## 2022-02-15 NOTE — Assessment & Plan Note (Signed)
CPAP download was reviewed which shows excellent control of events on auto settings 10 to 20 cm with average pressure of 17.5 cm and maximum pressure of 19 cm. He is very compliant and CPAP is certainly helped improve his daytime somnolence and fatigue  Weight loss encouraged, compliance with goal of at least 4-6 hrs every night is the expectation. Advised against medications with sedative side effects Cautioned against driving when sleepy - understanding that sleepiness will vary on a day to day basis

## 2022-02-15 NOTE — Progress Notes (Signed)
   Subjective:    Patient ID: Steven Shepard, male    DOB: 05-22-1945, 76 y.o.   MRN: [id number]  HPI  76 yo ex smoker, with mild COPD & A fibn for annual  FU of obstructive sleep apnea  -retired [employer]  -CPAP 9 cm   PMH - AF  Chief Complaint  Patient presents with   Follow-up    Pt states no new issues since LOV.   1-year follow-up He lives part-time at [employer].  Compliant with his CPAP machine No problems with mask or pressure He needed a CPAP mask urgently when [place] was in [place] a [place]  A-fib is controlled on Coreg and Xarelto He has gained 8 pounds since his last visit in 2021   Significant tests/ events reviewed PSG 09/2010  severe obstructive sleep apnea with AHI 86/h corrected by CPAP 9 cm  Maintained on CPAP 9cm, small nasal comfort gel mask,    2010 Spirometry FEV1 83%, ratio 69  Review of Systems neg for any significant sore throat, dysphagia, itching, sneezing, nasal congestion or excess/ purulent secretions, fever, chills, sweats, unintended wt loss, pleuritic or exertional cp, hempoptysis, orthopnea pnd or change in chronic leg swelling. Also denies presyncope, palpitations, heartburn, abdominal pain, nausea, vomiting, diarrhea or change in bowel or urinary habits, dysuria,hematuria, rash, arthralgias, visual complaints, headache, numbness weakness or ataxia.     Objective:   Physical Exam  Gen. Pleasant, obese, in no distress ENT - no lesions, no post nasal drip Neck: No JVD, no thyromegaly, no carotid bruits Lungs: no use of accessory muscles, no dullness to percussion, decreased without rales or rhonchi  Cardiovascular: Rhythm regular, heart sounds  normal, no murmurs or gallops, no peripheral edema Musculoskeletal: No deformities, no cyanosis or clubbing , no tremors       Assessment & Plan:

## 2022-02-15 NOTE — Assessment & Plan Note (Signed)
Very mild. Does not need bronchodilators

## 2022-02-18 LAB — COLOGUARD: COLOGUARD: NEGATIVE

## 2022-02-19 NOTE — Progress Notes (Signed)
Please inform patient of the following:  Cologuard is negative. We can repeat in 3 years.  Steven Shepard. Jerline Pain, MD 02/19/2022 12:46 PM

## 2022-02-20 DIAGNOSIS — L814 Other melanin hyperpigmentation: Secondary | ICD-10-CM | POA: Diagnosis not present

## 2022-02-20 DIAGNOSIS — L821 Other seborrheic keratosis: Secondary | ICD-10-CM | POA: Diagnosis not present

## 2022-02-20 DIAGNOSIS — Z85828 Personal history of other malignant neoplasm of skin: Secondary | ICD-10-CM | POA: Diagnosis not present

## 2022-02-20 DIAGNOSIS — D2372 Other benign neoplasm of skin of left lower limb, including hip: Secondary | ICD-10-CM | POA: Diagnosis not present

## 2022-02-20 DIAGNOSIS — L570 Actinic keratosis: Secondary | ICD-10-CM | POA: Diagnosis not present

## 2022-02-20 DIAGNOSIS — D1801 Hemangioma of skin and subcutaneous tissue: Secondary | ICD-10-CM | POA: Diagnosis not present

## 2022-02-20 DIAGNOSIS — L738 Other specified follicular disorders: Secondary | ICD-10-CM | POA: Diagnosis not present

## 2022-02-27 ENCOUNTER — Ambulatory Visit (INDEPENDENT_AMBULATORY_CARE_PROVIDER_SITE_OTHER): Payer: Medicare Other

## 2022-02-27 VITALS — Wt 234.0 lb

## 2022-02-27 DIAGNOSIS — Z0000 Encounter for general adult medical examination without abnormal findings: Secondary | ICD-10-CM | POA: Diagnosis not present

## 2022-02-27 NOTE — Progress Notes (Signed)
I connected with  [name] on 02/27/22 by a audio enabled telemedicine application and verified that I am speaking with the correct person using two identifiers.  Patient Location: Home  Provider Location: Office/Clinic  I discussed the limitations of evaluation and management by telemedicine. The patient expressed understanding and agreed to proceed.   Subjective:   Steven Shepard is a 76 y.o. male who presents for Medicare Annual/Subsequent preventive examination.  Review of Systems     Cardiac Risk Factors include: advanced age (>5mn, >>36women);hypertension;dyslipidemia;male gender;obesity (BMI >30kg/m2)     Objective:    Today's Vitals   02/27/22 0803  Weight: 234 lb (106.1 kg)   Body mass index is 30.87 kg/m.     02/27/2022    8:08 AM 03/15/2021    5:27 PM 03/08/2021    1:12 PM 03/02/2021    9:48 AM 02/21/2021    7:58 AM 03/23/2020   10:16 AM 02/18/2020    9:27 AM  Advanced Directives  Does Patient Have a Medical Advance Directive? Yes Yes Yes Yes Yes Yes Yes  Type of AParamedicof ACannon AFBLiving will Living will;Healthcare Power of Attorney Living will;Healthcare Power of ABartonsvilleLiving will HRoyalLiving will HBel Air SouthLiving will HOnslowLiving will  Does patient want to make changes to medical advance directive? No - Patient declined No - Patient declined No - Patient declined      Copy of [place] Chart? Yes - validated most recent copy scanned in chart (See row information) No - copy requested   No - copy requested  No - copy requested    Current Medications (verified) Outpatient Encounter Medications as of 02/27/2022  Medication Sig   acetaminophen (TYLENOL) 650 MG CR tablet Take 650 mg by mouth 2 (two) times daily as needed for pain.   amLODipine (NORVASC) 10 MG tablet TAKE 1 TABLET(10 MG) BY MOUTH DAILY    atorvastatin (LIPITOR) 20 MG tablet TAKE 1 TABLET(20 MG) BY MOUTH DAILY   carvedilol (COREG) 12.5 MG tablet TAKE 1 TABLET(12.5 MG) BY MOUTH TWICE DAILY   fexofenadine (ALLEGRA) 180 MG tablet Take 180 mg by mouth in the morning.   folic acid (FOLVITE) 8798MCG tablet Take 800 mcg by mouth 2 (two) times daily.   furosemide (LASIX) 40 MG tablet TAKE 1 TABLET BY MOUTH TWICE DAILY AT 8 AM AND AT 2 PM   Krill Oil 500 MG CAPS Take 500 mg by mouth daily with supper.   metFORMIN (GLUCOPHAGE) 500 MG tablet Take 1 tablet (500 mg total) by mouth daily with breakfast.   Misc Natural Product Nasal (NASAL CLEANSE RINSE MIX NA) Place into the nose in the morning.   Multiple Vitamins-Minerals (EMERGEN-C IMMUNE PO) Take 1 packet by mouth See admin instructions. Mix 1 packet into 4-8 ounces of water with one well-rounded teaspoonful of Metamucil powder and drink every morning   Multiple Vitamins-Minerals (ONE-A-DAY MENS 50+) TABS Take 1 tablet by mouth daily with breakfast.   potassium chloride SA (KLOR-CON M) 20 MEQ tablet Take 1 tablet (20 mEq total) by mouth See admin instructions. Take 20 mEq by mouth at 8 AM and 2 PM   PRESCRIPTION MEDICATION CPAP- At bedtime and during any time of rest   Psyllium (METAMUCIL FIBER PO) Take by mouth See admin instructions. Mix 1 well-rounded teaspoonful of powder into 4-8 ounces of water with one Emergen-C packet and drink every morning   tamsulosin (  FLOMAX) 0.4 MG CAPS capsule TAKE 1 CAPSULE(0.4 MG) BY MOUTH DAILY   XARELTO 20 MG TABS tablet TAKE 1 TABLET(20 MG) BY MOUTH DAILY WITH SUPPER   diphenhydrAMINE-APAP, sleep, (TYLENOL PM EXTRA STRENGTH PO) Take 1 tablet by mouth at bedtime. (Patient not taking: Reported on 02/27/2022)   No facility-administered encounter medications on file as of 02/27/2022.    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 Maureen Ralphs   TOTAL HIP ARTHROPLASTY Left 03/15/2021   Procedure: TOTAL HIP ARTHROPLASTY ANTERIOR APPROACH;  Surgeon: Gaynelle Arabian, MD;  Location: [facility];  Service: Orthopedics;  Laterality: Left;   Family History  Problem Relation Age of Onset   Coronary artery disease Mother    Hypertension Mother    Heart attack Mother    Coronary artery disease Father    Hypertension Father    Heart attack Father    Diabetes Sister    Hypertension Sister    Diabetes Brother    Cancer Brother    Colon cancer Neg Hx    Colonic polyp Neg Hx    Social History   Socioeconomic History   Marital status: Married    Spouse name: Not on file   Number of children: Not on file   Years of education: Not on file   Highest education level: Not on file  Occupational History   Occupation: Architectural technologist: [employer]    Comment: retired  Tobacco Use   Smoking status: Former    Packs/day: 1.50    Years: 3.00    Total pack years: 4.50    Types: Cigarettes, Pipe    Quit date: 03/12/1973    Years since quitting: 48.9   Smokeless tobacco: Never   Tobacco comments:    quit appox 36 years ago  Vaping Use   Vaping Use: Never used  Substance and Sexual Activity   Alcohol use: Yes    Comment: occasionally, 1-7  drinks per week   Drug use: No   Sexual activity: Yes  Other Topics Concern   Not on file  Social History Narrative   Not on file   Social Determinants of Health   Financial Resource Strain: Low Risk  (02/27/2022)   Overall Financial Resource Strain (CARDIA)    Difficulty of Paying Living Expenses: Not hard at all  Food Insecurity: No Food Insecurity (02/27/2022)   Hunger Vital Sign    Worried About Running Out of Food in the Last Year:  Never true    Ran Out of Food in the Last Year: Never true  Transportation Needs: No Transportation Needs (02/27/2022)   PRAPARE - Hydrologist (Medical): No    Lack of Transportation (Non-Medical): No  Physical Activity: Sufficiently Active (02/27/2022)   Exercise Vital Sign    Days of Exercise per Week: 7 days    Minutes of Exercise per Session: 30 min  Stress: No Stress Concern Present (02/27/2022)   [place]    Feeling of Stress : Not at all  Social Connections: [place] (02/27/2022)   Social Connection and Isolation Panel [NHANES]    Frequency of Communication with Friends and Family: More than three times a week    Frequency of Social Gatherings with Friends and Family: More than three times a week    Attends Religious Services: More than 4 times per year    Active Member of [employer] or Organizations: Yes    Attends Archivist Meetings: 1 to 4 times per year    Marital Status: Married    Tobacco Counseling Counseling given: Not Answered Tobacco comments: quit appox 36 years ago   Clinical Intake:  Pre-visit preparation completed: Yes  Pain : No/denies pain     BMI - recorded: 30.87 Nutritional Status: BMI > 30  Obese Nutritional Risks: None Diabetes: No  How often do you need to have someone help you when you read instructions, pamphlets, or other written materials from your doctor or pharmacy?: 1 -  Never  Diabetic?no  Interpreter Needed?: No  Information entered by :: Charlott Rakes, LPN   Activities of Daily Living    02/27/2022    8:09 AM 02/23/2022    6:17 PM  In your present state of health, do you have any difficulty performing the following activities:  Hearing? 0 0  Vision? 0 0  Difficulty concentrating or making decisions? 0 0  Walking or climbing stairs? 0 0  Dressing or bathing? 0 0  Doing errands, shopping? 0 0  Preparing Food and eating ? N N  Using the Toilet? N N  In the past six months, have you accidently leaked urine? N N  Do you have problems with loss of bowel control? N N  Managing your Medications? N N  Managing your Finances? N N  Housekeeping or managing your Housekeeping? N N    Patient Care Team: Vivi Barrack, MD as PCP - General (Family Medicine) Buford Dresser, MD as PCP - Cardiology (Cardiology) Sharyne Peach, MD as Consulting Physician (Ophthalmology) Dorothy Spark, MD as Consulting Physician (Cardiology) Jovita Gamma, MD as Consulting Physician (Neurosurgery) Laurence Spates, MD (Inactive) as Consulting Physician (Gastroenterology) Martinique, Amy, MD as Consulting Physician (Dermatology) Rigoberto Noel, MD as Consulting Physician (Pulmonary Disease) Volanda Napoleon, MD as Consulting Physician (Oncology)  Indicate any recent Medical Services you may have received from other than [facility] providers in the past year (date may be approximate).     Assessment:   This is a routine wellness examination for [facility].  Hearing/Vision screen Hearing Screening - Comments:: Pt denies any hearing issues  Vision Screening - Comments:: Pt follows up with Dr Delman Cheadle for annual eye exams   Dietary issues and exercise activities discussed: Current Exercise Habits: Home exercise routine, Type of exercise: walking, Time (Minutes): 30, Frequency (Times/Week): 7, Weekly Exercise (Minutes/Week): 210   Goals Addressed  This  Visit's Progress    Patient Stated       Lose a little weight        Depression Screen    02/27/2022    8:07 AM 02/05/2022    7:29 AM 02/21/2021    7:57 AM 11/01/2020    3:31 PM 02/18/2020    9:26 AM 01/13/2020    8:28 AM 07/15/2019    8:16 AM  PHQ 2/9 Scores  PHQ - 2 Score 0 0 0 0 0 0 0    Fall Risk    02/27/2022    8:09 AM 02/23/2022    6:17 PM 02/05/2022    7:29 AM 02/21/2021    7:59 AM 11/01/2020    3:31 PM  Fall Risk   Falls in the past year? 0 0 0 0 0  Number falls in past yr: 0 0 0 0 0  Injury with Fall? 0 0 0 0 0  Risk for fall due to : Impaired vision  No Fall Risks Impaired vision   Follow up Falls prevention discussed   Falls prevention discussed     FALL RISK PREVENTION PERTAINING TO THE HOME:  Any stairs in or around the home? Yes  If so, are there any without handrails? No  Home free of loose throw rugs in walkways, pet beds, electrical cords, etc? Yes  Adequate lighting in your home to reduce risk of falls? Yes   ASSISTIVE DEVICES UTILIZED TO PREVENT FALLS:  Life alert? No  Use of a cane, walker or w/c? No  Grab bars in the bathroom? No  Shower chair or bench in shower? Yes  Elevated toilet seat or a handicapped toilet? No   TIMED UP AND GO:  Was the test performed? No .   Cognitive Function:        02/27/2022    8:09 AM 02/21/2021    8:02 AM 02/18/2020    9:32 AM 01/14/2019    2:11 PM 01/08/2018    1:26 PM  6CIT Screen  What Year? 0 points 0 points 0 points 0 points 0 points  What month? 0 points 0 points 0 points 0 points 0 points  What time? 0 points 0 points  0 points 0 points  Count back from 20 0 points 0 points 0 points 0 points 0 points  Months in reverse 0 points 0 points 0 points 0 points 0 points  Repeat phrase 0 points 0 points 0 points 0 points 0 points  Total Score 0 points 0 points  0 points 0 points    Immunizations Immunization History  Administered Date(s) Administered   Fluad Quad(high Dose 65+) 12/17/2018,  01/12/2020, 01/12/2021, 02/05/2022   Influenza Split 12/11/2011, 12/10/2012   Influenza, High Dose Seasonal PF 01/09/2012, 01/09/2013, 01/11/2014, 12/14/2015, 01/01/2018   Influenza-Unspecified 01/09/2012, 01/09/2013, 12/10/2013, 01/11/2014, 12/14/2015, 11/25/2016   PFIZER(Purple Top)SARS-COV-2 Vaccination 03/31/2019, 04/21/2019, 12/18/2019   PNEUMOCOCCAL CONJUGATE-20 05/30/2021   Pneumococcal Conjugate-13 11/15/2015   Pneumococcal Polysaccharide-23 09/09/2009, 06/07/2010, 01/11/2018   Tdap 11/20/2005, 01/11/2018   Zoster Recombinat (Shingrix) 05/30/2021   Zoster, Live 03/12/2010    TDAP status: Up to date  Flu Vaccine status: Up to date  Pneumococcal vaccine status: Up to date  Covid-19 vaccine status: Completed vaccines  Qualifies for Shingles Vaccine? Yes   Zostavax completed Yes   Shingrix Completed?: Yes  Screening Tests Health Maintenance  Topic Date Due   Zoster Vaccines- Shingrix (2 of 2) 05/08/2022 (Originally 07/25/2021)   Medicare Annual Wellness (AWV)  02/28/2023  Fecal DNA (Cologuard)  02/12/2025   DTaP/Tdap/Td (3 - Td or Tdap) 01/12/2028   Pneumonia Vaccine 74+ Years old  Completed   INFLUENZA VACCINE  Completed   Hepatitis C Screening  Completed   HPV VACCINES  Aged Out   COLONOSCOPY (Pts 45-82yr Insurance coverage will need to be confirmed)  Discontinued   COVID-19 Vaccine  Discontinued    Health Maintenance  There are no preventive care reminders to display for this patient.  Colorectal cancer screening: Type of screening: Cologuard. Completed 02/12/22. Repeat every 3 years   Additional Screening:  Hepatitis C Screening: Completed 03/01/17  Vision Screening: Recommended annual ophthalmology exams for early detection of glaucoma and other disorders of the eye. Is the patient up to date with their annual eye exam?  Yes  Who is the provider or what is the name of the office in which the patient attends annual eye exams? Dr GDelman Cheadle If pt is not  established with a provider, would they like to be referred to a provider to establish care? No .   Dental Screening: Recommended annual dental exams for proper oral hygiene  Community Resource Referral / Chronic Care Management: CRR required this visit?  No   CCM required this visit?  No      Plan:     I have personally reviewed and noted the following in the patient's chart:   Medical and social history Use of alcohol, tobacco or illicit drugs  Current medications and supplements including opioid prescriptions. Patient is not currently taking opioid prescriptions. Functional ability and status Nutritional status Physical activity Advanced directives List of other physicians Hospitalizations, surgeries, and ER visits in previous 12 months Vitals Screenings to include cognitive, depression, and falls Referrals and appointments  In addition, I have reviewed and discussed with patient certain preventive protocols, quality metrics, and best practice recommendations. A written personalized care plan for preventive services as well as general preventive health recommendations were provided to patient.     TWillette Brace LPN   [id number]  Nurse Notes: none

## 2022-02-27 NOTE — Patient Instructions (Signed)
Steven Shepard , Thank you for taking time to come for your Medicare Wellness Visit. I appreciate your ongoing commitment to your health goals. Please review the following plan we discussed and let me know if I can assist you in the future.   These are the goals we discussed:  Goals      Patient Stated     Lose weight     Patient Stated     Lose weight      Patient Stated     Lose a little weight      Reduce salt intake to 2 grams per day or less     Weight (lb) < 200 lb (90.7 kg)     Would like to lose weight to get to around 220lbs        This is a list of the screening recommended for you and due dates:  Health Maintenance  Topic Date Due   Zoster (Shingles) Vaccine (2 of 2) 05/08/2022*   Medicare Annual Wellness Visit  02/28/2023   Cologuard (Stool DNA test)  02/12/2025   DTaP/Tdap/Td vaccine (3 - Td or Tdap) 01/12/2028   Pneumonia Vaccine  Completed   Flu Shot  Completed   Hepatitis C Screening: USPSTF Recommendation to screen - Ages 39-79 yo.  Completed   HPV Vaccine  Aged Out   Colon Cancer Screening  Discontinued   COVID-19 Vaccine  Discontinued  *Topic was postponed. The date shown is not the original due date.    Advanced directives: copies in chart   Conditions/risks identified: lose weight   Next appointment: Follow up in one year for your annual wellness visit.   Preventive Care 75 Years and Older, Male  Preventive care refers to lifestyle choices and visits with your health care provider that can promote health and wellness. What does preventive care include? A yearly physical exam. This is also called an annual well check. Dental exams once or twice a year. Routine eye exams. Ask your health care provider how often you should have your eyes checked. Personal lifestyle choices, including: Daily care of your teeth and gums. Regular physical activity. Eating a healthy diet. Avoiding tobacco and drug use. Limiting alcohol use. Practicing safe  sex. Taking low doses of aspirin every day. Taking vitamin and mineral supplements as recommended by your health care provider. What happens during an annual well check? The services and screenings done by your health care provider during your annual well check will depend on your age, overall health, lifestyle risk factors, and family history of disease. Counseling  Your health care provider may ask you questions about your: Alcohol use. Tobacco use. Drug use. Emotional well-being. Home and relationship well-being. Sexual activity. Eating habits. History of falls. Memory and ability to understand (cognition). Work and work Statistician. Screening  You may have the following tests or measurements: Height, weight, and BMI. Blood pressure. Lipid and cholesterol levels. These may be checked every 5 years, or more frequently if you are over 8 years old. Skin check. Lung cancer screening. You may have this screening every year starting at age 20 if you have a 30-pack-year history of smoking and currently smoke or have quit within the past 15 years. Fecal occult blood test (FOBT) of the stool. You may have this test every year starting at age 29. Flexible sigmoidoscopy or colonoscopy. You may have a sigmoidoscopy every 5 years or a colonoscopy every 10 years starting at age 91. Prostate cancer screening. Recommendations will vary depending on  your family history and other risks. Hepatitis C blood test. Hepatitis B blood test. Sexually transmitted disease (STD) testing. Diabetes screening. This is done by checking your blood sugar (glucose) after you have not eaten for a while (fasting). You may have this done every 1-3 years. Abdominal aortic aneurysm (AAA) screening. You may need this if you are a current or former smoker. Osteoporosis. You may be screened starting at age 26 if you are at high risk. Talk with your health care provider about your test results, treatment options, and if  necessary, the need for more tests. Vaccines  Your health care provider may recommend certain vaccines, such as: Influenza vaccine. This is recommended every year. Tetanus, diphtheria, and acellular pertussis (Tdap, Td) vaccine. You may need a Td booster every 10 years. Zoster vaccine. You may need this after age 75. Pneumococcal 13-valent conjugate (PCV13) vaccine. One dose is recommended after age 75. Pneumococcal polysaccharide (PPSV23) vaccine. One dose is recommended after age 22. Talk to your health care provider about which screenings and vaccines you need and how often you need them. This information is not intended to replace advice given to you by your health care provider. Make sure you discuss any questions you have with your health care provider. Document Released: 03/25/2015 Document Revised: 11/16/2015 Document Reviewed: 12/28/2014 Elsevier Interactive Patient Education  2017 [place] Prevention in the Home Falls can cause injuries. They can happen to people of all ages. There are many things you can do to make your home safe and to help prevent falls. What can I do on the outside of my home? Regularly fix the edges of walkways and driveways and fix any cracks. Remove anything that might make you trip as you walk through a door, such as a raised step or threshold. Trim any bushes or trees on the path to your home. Use bright outdoor lighting. Clear any walking paths of anything that might make someone trip, such as rocks or tools. Regularly check to see if handrails are loose or broken. Make sure that both sides of any steps have handrails. Any raised decks and porches should have guardrails on the edges. Have any leaves, snow, or ice cleared regularly. Use sand or salt on walking paths during winter. Clean up any spills in your garage right away. This includes oil or grease spills. What can I do in the bathroom? Use night lights. Install grab bars by the toilet  and in the tub and shower. Do not use towel bars as grab bars. Use non-skid mats or decals in the tub or shower. If you need to sit down in the shower, use a plastic, non-slip stool. Keep the floor dry. Clean up any water that spills on the floor as soon as it happens. Remove soap buildup in the tub or shower regularly. Attach bath mats securely with double-sided non-slip rug tape. Do not have throw rugs and other things on the floor that can make you trip. What can I do in the bedroom? Use night lights. Make sure that you have a light by your bed that is easy to reach. Do not use any sheets or blankets that are too big for your bed. They should not hang down onto the floor. Have a firm chair that has side arms. You can use this for support while you get dressed. Do not have throw rugs and other things on the floor that can make you trip. What can I do in the kitchen? Clean  up any spills right away. Avoid walking on wet floors. Keep items that you use a lot in easy-to-reach places. If you need to reach something above you, use a strong step stool that has a grab bar. Keep electrical cords out of the way. Do not use floor polish or wax that makes floors slippery. If you must use wax, use non-skid floor wax. Do not have throw rugs and other things on the floor that can make you trip. What can I do with my stairs? Do not leave any items on the stairs. Make sure that there are handrails on both sides of the stairs and use them. Fix handrails that are broken or loose. Make sure that handrails are as long as the stairways. Check any carpeting to make sure that it is firmly attached to the stairs. Fix any carpet that is loose or worn. Avoid having throw rugs at the top or bottom of the stairs. If you do have throw rugs, attach them to the floor with carpet tape. Make sure that you have a light switch at the top of the stairs and the bottom of the stairs. If you do not have them, ask someone to add  them for you. What else can I do to help prevent falls? Wear shoes that: Do not have high heels. Have rubber bottoms. Are comfortable and fit you well. Are closed at the toe. Do not wear sandals. If you use a stepladder: Make sure that it is fully opened. Do not climb a closed stepladder. Make sure that both sides of the stepladder are locked into place. Ask someone to hold it for you, if possible. Clearly mark and make sure that you can see: Any grab bars or handrails. First and last steps. Where the edge of each step is. Use tools that help you move around (mobility aids) if they are needed. These include: Canes. Walkers. Scooters. Crutches. Turn on the lights when you go into a dark area. Replace any light bulbs as soon as they burn out. Set up your furniture so you have a clear path. Avoid moving your furniture around. If any of your floors are uneven, fix them. If there are any pets around you, be aware of where they are. Review your medicines with your doctor. Some medicines can make you feel dizzy. This can increase your chance of falling. Ask your doctor what other things that you can do to help prevent falls. This information is not intended to replace advice given to you by your health care provider. Make sure you discuss any questions you have with your health care provider. Document Released: 12/23/2008 Document Revised: 08/04/2015 Document Reviewed: 04/02/2014 Elsevier Interactive Patient Education  2017 [employer].

## 2022-02-28 ENCOUNTER — Inpatient Hospital Stay (HOSPITAL_BASED_OUTPATIENT_CLINIC_OR_DEPARTMENT_OTHER): Payer: Medicare Other | Admitting: Hematology & Oncology

## 2022-02-28 ENCOUNTER — Encounter: Payer: Self-pay | Admitting: Hematology & Oncology

## 2022-02-28 ENCOUNTER — Inpatient Hospital Stay: Payer: Medicare Other | Attending: Hematology & Oncology

## 2022-02-28 VITALS — BP 131/61 | HR 73 | Temp 97.9°F | Resp 20 | Ht 73.0 in | Wt 233.1 lb

## 2022-02-28 DIAGNOSIS — D5912 Cold autoimmune hemolytic anemia: Secondary | ICD-10-CM

## 2022-02-28 DIAGNOSIS — Z7901 Long term (current) use of anticoagulants: Secondary | ICD-10-CM | POA: Diagnosis not present

## 2022-02-28 DIAGNOSIS — Z79899 Other long term (current) drug therapy: Secondary | ICD-10-CM | POA: Diagnosis not present

## 2022-02-28 LAB — RETICULOCYTES
Immature Retic Fract: 13.5 % (ref 2.3–15.9)
RBC.: 3.3 MIL/uL — ABNORMAL LOW (ref 4.22–5.81)
Retic Count, Absolute: 85.8 10*3/uL (ref 19.0–186.0)
Retic Ct Pct: 2.6 % (ref 0.4–3.1)

## 2022-02-28 LAB — CBC WITH DIFFERENTIAL (CANCER CENTER ONLY)
Abs Immature Granulocytes: 0 10*3/uL (ref 0.00–0.07)
Basophils Absolute: 0 10*3/uL (ref 0.0–0.1)
Basophils Relative: 1 %
Eosinophils Absolute: 0.1 10*3/uL (ref 0.0–0.5)
Eosinophils Relative: 1 %
HCT: UNDETERMINED % (ref 39.0–52.0)
Hemoglobin: 12.6 g/dL — ABNORMAL LOW (ref 13.0–17.0)
Immature Granulocytes: 0 %
Lymphocytes Relative: 30 %
Lymphs Abs: 1.6 10*3/uL (ref 0.7–4.0)
MCH: UNDETERMINED pg (ref 26.0–34.0)
MCHC: UNDETERMINED g/dL (ref 30.0–36.0)
MCV: UNDETERMINED fL (ref 80.0–100.0)
Monocytes Absolute: 0.4 10*3/uL (ref 0.1–1.0)
Monocytes Relative: 7 %
Neutro Abs: 3.2 10*3/uL (ref 1.7–7.7)
Neutrophils Relative %: 61 %
Platelet Count: 206 10*3/uL (ref 150–400)
RBC: UNDETERMINED MIL/uL (ref 4.22–5.81)
RDW: UNDETERMINED % (ref 11.5–15.5)
WBC Count: 5.3 10*3/uL (ref 4.0–10.5)
nRBC: 0 % (ref 0.0–0.2)

## 2022-02-28 LAB — CMP (CANCER CENTER ONLY)
ALT: 19 U/L (ref 0–44)
AST: 17 U/L (ref 15–41)
Albumin: 4.3 g/dL (ref 3.5–5.0)
Alkaline Phosphatase: 64 U/L (ref 38–126)
Anion gap: 8 (ref 5–15)
BUN: 15 mg/dL (ref 8–23)
CO2: 31 mmol/L (ref 22–32)
Calcium: 9.2 mg/dL (ref 8.9–10.3)
Chloride: 102 mmol/L (ref 98–111)
Creatinine: 0.85 mg/dL (ref 0.61–1.24)
GFR, Estimated: >60 mL/min (ref >60)
Glucose, Bld: 173 mg/dL — ABNORMAL HIGH (ref 70–99)
Potassium: 3.9 mmol/L (ref 3.5–5.1)
Sodium: 141 mmol/L (ref 135–145)
Total Bilirubin: 2.4 mg/dL — ABNORMAL HIGH (ref 0.3–1.2)
Total Protein: 6.9 g/dL (ref 6.5–8.1)

## 2022-02-28 LAB — LACTATE DEHYDROGENASE: LDH: 151 U/L (ref 98–192)

## 2022-02-28 NOTE — Progress Notes (Signed)
Hematology and Oncology Follow Up Visit  Steven Shepard [id number] [birth year] 76 y.o. 02/28/2022   Principle Diagnosis:  Cold agglutinin disease  Current Therapy:   Folic acid 2 mg p.o. daily      Interim History:  Steven Shepard is  back for another visit.  We see him every 6 months.  He had no problems over the summertime.  He had a nice Thanksgiving.  He has been playing golf.  He had his hip surgery about a year ago.  He really has come along nicely from this.  He has been playing quite a bit of golf.  He is having no problems with golf.  He is on folic acid.  He is doing well with the folic acid.  The warm weather certainly does make the cold agglutinin disease less prominent.  He still has a very high cold agglutinin titer.  However, he seems to be very well compensated for this.  He has had no change in bowel or bladder habits.  He has had no problems with COVID.  He has had no leg swelling.  He continues on the Coumadin.  He has a heart valve repair.  He is doing well on the Coumadin.  Overall, I would say his performance status is probably ECOG 1.    Medications:  Current Outpatient Medications:    acetaminophen (TYLENOL) 650 MG CR tablet, Take 650 mg by mouth 2 (two) times daily as needed for pain., Disp: , Rfl:    amLODipine (NORVASC) 10 MG tablet, TAKE 1 TABLET(10 MG) BY MOUTH DAILY, Disp: 90 tablet, Rfl: 1   atorvastatin (LIPITOR) 20 MG tablet, TAKE 1 TABLET(20 MG) BY MOUTH DAILY, Disp: 90 tablet, Rfl: 3   carvedilol (COREG) 12.5 MG tablet, TAKE 1 TABLET(12.5 MG) BY MOUTH TWICE DAILY, Disp: 180 tablet, Rfl: 3   fexofenadine (ALLEGRA) 180 MG tablet, Take 180 mg by mouth in the morning., Disp: , Rfl:    folic acid (FOLVITE) 680 MCG tablet, Take 800 mcg by mouth 2 (two) times daily., Disp: , Rfl:    furosemide (LASIX) 40 MG tablet, TAKE 1 TABLET BY MOUTH TWICE DAILY AT 8 AM AND AT 2 PM, Disp: 180 tablet, Rfl: 0   Krill Oil 500 MG CAPS, Take 500 mg by mouth daily with  supper., Disp: , Rfl:    metFORMIN (GLUCOPHAGE) 500 MG tablet, Take 1 tablet (500 mg total) by mouth daily with breakfast., Disp: 90 tablet, Rfl: 3   Misc Natural Product Nasal (NASAL CLEANSE RINSE MIX NA), Place into the nose in the morning., Disp: , Rfl:    Multiple Vitamins-Minerals (EMERGEN-C IMMUNE PO), Take 1 packet by mouth See admin instructions. Mix 1 packet into 4-8 ounces of water with one well-rounded teaspoonful of Metamucil powder and drink every morning, Disp: , Rfl:    Multiple Vitamins-Minerals (ONE-A-DAY MENS 50+) TABS, Take 1 tablet by mouth daily with breakfast., Disp: , Rfl:    potassium chloride SA (KLOR-CON M) 20 MEQ tablet, Take 1 tablet (20 mEq total) by mouth See admin instructions. Take 20 mEq by mouth at 8 AM and 2 PM, Disp: 180 tablet, Rfl: 3   PRESCRIPTION MEDICATION, CPAP- At bedtime and during any time of rest, Disp: , Rfl:    Psyllium (METAMUCIL FIBER PO), Take by mouth See admin instructions. Mix 1 well-rounded teaspoonful of powder into 4-8 ounces of water with one Emergen-C packet and drink every morning, Disp: , Rfl:    tamsulosin (FLOMAX) 0.4 MG CAPS capsule, TAKE  1 CAPSULE(0.4 MG) BY MOUTH DAILY, Disp: 90 capsule, Rfl: 1   XARELTO 20 MG TABS tablet, TAKE 1 TABLET(20 MG) BY MOUTH DAILY WITH SUPPER, Disp: 90 tablet, Rfl: 3   diphenhydrAMINE-APAP, sleep, (TYLENOL PM EXTRA STRENGTH PO), Take 1 tablet by mouth at bedtime. (Patient not taking: Reported on 02/27/2022), Disp: , Rfl:   Allergies:  Allergies  Allergen Reactions   Ace Inhibitors Other (See Comments)    High K+    Codeine Other (See Comments)    Hallucinations   Spironolactone Other (See Comments)    Increases potassium    Past Medical History, Surgical history, Social history, and Family History were reviewed and updated.  Review of Systems: Review of Systems  Constitutional: Negative.   HENT:  Negative.    Eyes: Negative.   Respiratory: Negative.    Cardiovascular: Negative.    Gastrointestinal: Negative.   Endocrine: Negative.   Genitourinary: Negative.    Musculoskeletal: Negative.   Skin: Negative.   Neurological: Negative.   Hematological: Negative.   Psychiatric/Behavioral: Negative.      Physical Exam:  height is 6' 1"  (1.854 m) and weight is 233 lb 1.3 oz (105.7 kg). His oral temperature is 97.9 F (36.6 C). His blood pressure is 131/61 and his pulse is 73. His respiration is 20 and oxygen saturation is 97%.   Wt Readings from Last 3 Encounters:  02/28/22 233 lb 1.3 oz (105.7 kg)  02/27/22 234 lb (106.1 kg)  02/15/22 234 lb 6.4 oz (106.3 kg)    Physical Exam Vitals reviewed.  HENT:     Head: Normocephalic and atraumatic.  Eyes:     Pupils: Pupils are equal, round, and reactive to light.  Cardiovascular:     Rate and Rhythm: Normal rate and regular rhythm.     Heart sounds: Normal heart sounds.  Pulmonary:     Effort: Pulmonary effort is normal.     Breath sounds: Normal breath sounds.  Abdominal:     General: Bowel sounds are normal.     Palpations: Abdomen is soft.  Musculoskeletal:        General: No tenderness or deformity. Normal range of motion.     Cervical back: Normal range of motion.  Lymphadenopathy:     Cervical: No cervical adenopathy.  Skin:    General: Skin is warm and dry.     Findings: No erythema or rash.  Neurological:     Mental Status: He is alert and oriented to person, place, and time.  Psychiatric:        Behavior: Behavior normal.        Thought Content: Thought content normal.        Judgment: Judgment normal.      Lab Results  Component Value Date   WBC 5.3 02/28/2022   HGB 12.6 (L) 02/28/2022   HCT Unable to determine due to a cold agglutinin 02/28/2022   MCV Unable to determine due to a cold agglutinin 02/28/2022   PLT 206 02/28/2022     Chemistry      Component Value Date/Time   NA 141 02/28/2022 0953   K 3.9 02/28/2022 0953   CL 102 02/28/2022 0953   CO2 31 02/28/2022 0953   BUN 15  02/28/2022 0953   CREATININE 0.85 02/28/2022 0953   CREATININE 0.89 01/13/2020 0906      Component Value Date/Time   CALCIUM 9.2 02/28/2022 0953   ALKPHOS 64 02/28/2022 0953   AST 17 02/28/2022 0953   ALT 19  02/28/2022 0953   BILITOT 2.4 (H) 02/28/2022 0953      Impression and Plan: Mr. Man is a very nice 76 year old white male.  He has cold agglutinin disease.  As far as I can tell, this is idiopathic.  There is no associated lymphoproliferative disease or autoimmune disease that we have been able to find..  Again, his hemoglobin is holding steady.  His reticulocyte count is not bad.  He will always have a cold agglutinin.  Unless we see that his hemoglobin is affected, I do not see that we have to intervene with therapy.  We will continue to see him back in 6 months.    Volanda Napoleon, MD 12/20/202310:58 AM

## 2022-03-01 LAB — HAPTOGLOBIN: Haptoglobin: 89 mg/dL (ref 34–355)

## 2022-03-02 LAB — COLD AGGLUTININ TITER: Cold Agglutinin Titer: 1:4096 {titer} — ABNORMAL HIGH

## 2022-03-08 ENCOUNTER — Other Ambulatory Visit: Payer: Self-pay | Admitting: Family Medicine

## 2022-03-08 DIAGNOSIS — I480 Paroxysmal atrial fibrillation: Secondary | ICD-10-CM

## 2022-03-08 DIAGNOSIS — I5031 Acute diastolic (congestive) heart failure: Secondary | ICD-10-CM

## 2022-04-24 ENCOUNTER — Telehealth: Payer: Self-pay

## 2022-04-24 NOTE — Patient Outreach (Signed)
  Care Coordination   Initial Visit Note   04/24/2022 Name: Anselmo Reihl MRN: [id number] DOB: 02-Sep-1945  Raghav Verrilli is a 77 y.o. year old male who sees Ennever, Rudell Cobb, MD for primary care. I spoke with  [name] by phone today.  What matters to the patients health and wellness today?  none     Goals Addressed             This Visit's Progress    COMPLETED: Care coordination activities-No follow up required       Interventions Today    Flowsheet Row Most Recent Value  General Interventions   General Interventions Discussed/Reviewed General Interventions Discussed, Doctor Visits, Health Screening  Doctor Visits Discussed/Reviewed Doctor Visits Discussed, Annual Wellness Visits  Health Screening Colonoscopy              SDOH assessments and interventions completed:  Yes  SDOH Interventions Today    Flowsheet Row Most Recent Value  SDOH Interventions   Housing Interventions Intervention Not Indicated        Care Coordination Interventions:  Yes, provided   Follow up plan: No further intervention required.   Encounter Outcome:  Pt. Visit Completed   Jone Baseman, RN, MSN [place] [facility] Care Management Coordinator Direct Line [phone]

## 2022-04-24 NOTE — Patient Instructions (Signed)
Visit Information  Thank you for taking time to visit with me today. Please don't hesitate to contact me if I can be of assistance to you.   Following are the goals we discussed today:   Goals Addressed             This Visit's Progress    COMPLETED: Care coordination activities-No follow up required       Interventions Today    Flowsheet Row Most Recent Value  General Interventions   General Interventions Discussed/Reviewed General Interventions Discussed, Doctor Visits, Health Screening  Doctor Visits Discussed/Reviewed Doctor Visits Discussed, Annual Wellness Visits  Health Screening Colonoscopy               If you are experiencing a Mental Health or [place] or need someone to talk to, please call the Suicide and Crisis Lifeline: [phone]   Patient verbalizes understanding of instructions and care plan provided today and agrees to view in [place]. Active MyChart status and patient understanding of how to access instructions and care plan via MyChart confirmed with patient.     No further follow up required: decline  Jone Baseman, RN, MSN [place] [facility] Care Management Coordinator Direct Line [phone]

## 2022-05-22 DIAGNOSIS — D225 Melanocytic nevi of trunk: Secondary | ICD-10-CM | POA: Diagnosis not present

## 2022-05-22 DIAGNOSIS — D2372 Other benign neoplasm of skin of left lower limb, including hip: Secondary | ICD-10-CM | POA: Diagnosis not present

## 2022-05-22 DIAGNOSIS — D1801 Hemangioma of skin and subcutaneous tissue: Secondary | ICD-10-CM | POA: Diagnosis not present

## 2022-05-22 DIAGNOSIS — L570 Actinic keratosis: Secondary | ICD-10-CM | POA: Diagnosis not present

## 2022-05-22 DIAGNOSIS — Z85820 Personal history of malignant melanoma of skin: Secondary | ICD-10-CM | POA: Diagnosis not present

## 2022-05-22 DIAGNOSIS — C44329 Squamous cell carcinoma of skin of other parts of face: Secondary | ICD-10-CM | POA: Diagnosis not present

## 2022-05-22 DIAGNOSIS — L814 Other melanin hyperpigmentation: Secondary | ICD-10-CM | POA: Diagnosis not present

## 2022-05-22 DIAGNOSIS — D2271 Melanocytic nevi of right lower limb, including hip: Secondary | ICD-10-CM | POA: Diagnosis not present

## 2022-05-22 DIAGNOSIS — Z85828 Personal history of other malignant neoplasm of skin: Secondary | ICD-10-CM | POA: Diagnosis not present

## 2022-05-22 DIAGNOSIS — D2361 Other benign neoplasm of skin of right upper limb, including shoulder: Secondary | ICD-10-CM | POA: Diagnosis not present

## 2022-06-18 ENCOUNTER — Other Ambulatory Visit (HOSPITAL_BASED_OUTPATIENT_CLINIC_OR_DEPARTMENT_OTHER): Payer: Self-pay | Admitting: Cardiology

## 2022-06-18 ENCOUNTER — Other Ambulatory Visit: Payer: Self-pay | Admitting: Family Medicine

## 2022-06-18 DIAGNOSIS — I5031 Acute diastolic (congestive) heart failure: Secondary | ICD-10-CM

## 2022-06-18 DIAGNOSIS — I480 Paroxysmal atrial fibrillation: Secondary | ICD-10-CM

## 2022-06-18 NOTE — Telephone Encounter (Signed)
Rx(s) sent to pharmacy electronically.  

## 2022-06-25 DIAGNOSIS — Z85828 Personal history of other malignant neoplasm of skin: Secondary | ICD-10-CM | POA: Diagnosis not present

## 2022-06-25 DIAGNOSIS — C44329 Squamous cell carcinoma of skin of other parts of face: Secondary | ICD-10-CM | POA: Diagnosis not present

## 2022-06-27 ENCOUNTER — Telehealth: Payer: Self-pay | Admitting: Cardiology

## 2022-06-27 DIAGNOSIS — I480 Paroxysmal atrial fibrillation: Secondary | ICD-10-CM

## 2022-06-27 MED ORDER — RIVAROXABAN 20 MG PO TABS
ORAL_TABLET | ORAL | 1 refills | Status: DC
Start: 2022-06-27 — End: 2022-10-16

## 2022-06-27 NOTE — Telephone Encounter (Signed)
 *  STAT* If patient is at the pharmacy, call can be transferred to refill team.   1. Which medications need to be refilled? (please list name of each medication and dose if known) XARELTO 20 MG TABS tablet   2. Which pharmacy/location (including street and city if local pharmacy) is medication to be sent to?  [id number] - [place], [place] - [address] AT [facility] OF [address]    3. Do they need a 30 day or 90 day supply? 90 days  Pt is out of meds, needs refill today

## 2022-06-27 NOTE — Telephone Encounter (Signed)
Xarelto 20mg  refill request received. Pt is 77 years old, weight-105.7kg, Crea-0.85 on 02/28/22, last seen by Dr. Cristal Deer on 04/03/21 and per her note pt has a 16 month recall date and is due July 2024, Diagnosis-Afib, CrCl-112.26 mL/min; Dose is appropriate based on dosing criteria. Will send in refill to requested pharmacy.

## 2022-07-26 ENCOUNTER — Encounter: Payer: Self-pay | Admitting: Family Medicine

## 2022-07-26 NOTE — Telephone Encounter (Signed)
Please schedule an OV with PCP  

## 2022-07-31 ENCOUNTER — Encounter: Payer: Self-pay | Admitting: Family Medicine

## 2022-07-31 ENCOUNTER — Ambulatory Visit (INDEPENDENT_AMBULATORY_CARE_PROVIDER_SITE_OTHER): Payer: Medicare Other | Admitting: Family Medicine

## 2022-07-31 VITALS — BP 105/73 | HR 94 | Temp 97.5°F | Ht 73.0 in | Wt 230.0 lb

## 2022-07-31 DIAGNOSIS — I10 Essential (primary) hypertension: Secondary | ICD-10-CM | POA: Diagnosis not present

## 2022-07-31 DIAGNOSIS — S30862A Insect bite (nonvenomous) of penis, initial encounter: Secondary | ICD-10-CM

## 2022-07-31 DIAGNOSIS — W57XXXA Bitten or stung by nonvenomous insect and other nonvenomous arthropods, initial encounter: Secondary | ICD-10-CM | POA: Diagnosis not present

## 2022-07-31 DIAGNOSIS — E782 Mixed hyperlipidemia: Secondary | ICD-10-CM

## 2022-07-31 NOTE — Assessment & Plan Note (Signed)
Blood pressure at goal on amlodipine 10 mg daily and Coreg 12.5 mg twice daily. 

## 2022-07-31 NOTE — Progress Notes (Signed)
   Steven Shepard is a 77 y.o. male who presents today for an office visit.  Assessment/Plan:  New/Acute Problems: Tick Bite No red flag signs or symptoms.  Does have a small local inflammatory reaction but no other signs or symptoms of tickborne illness.  Given that tick was likely attached for less than 24 hours no indication for starting prophylactic antibiotics at this point.  We did discuss warning signs for tickborne illness including fevers, chills, rash, body aches, and headaches.  He will let us know if any new symptoms develop.  He can use cortisone as needed to the local inflammatory reaction.  Follow-up as needed.  Chronic Problems Addressed Today: Essential hypertension Blood pressure at goal on amlodipine 10 mg daily and Coreg 12.5 mg twice daily.  Hyperlipidemia Continue Lipitor 20 mg daily.  Check lipids later this year at annual follow-up visit.  He is tolerating this well.     Subjective:  HPI:  See A/P for status of chronic conditions.  Patient is here with tick bite on groin area.  Found a tick about 6 days ago. Took it off immediately. Thinks it was probably attached less than a day.  No rash. No fevers or chills.  No headache.  No body aches.      Objective:  Physical Exam: BP 105/73   Pulse 94   Temp (!) 97.5 F (36.4 C) (Temporal)   Ht 6\' 1"  (1.854 m)   Wt 230 lb (104.3 kg)   SpO2 98%   BMI 30.34 kg/m   Gen: No acute distress, resting comfortably CV: Irregular with 2 out of 6 systolic murmur Pulm: Normal work of breathing, clear to auscultation bilaterally with no crackles, wheezes, or rhonchi Skin: Slightly raised approximately 3 x 7 mm erythematous area on left inguinal crease Neuro: Grossly normal, moves all extremities Psych: Normal affect and thought content      Steven Gary M. Jimmey Ralph, MD 07/31/2022 9:19 AM

## 2022-07-31 NOTE — Patient Instructions (Signed)
It was very nice to see you today!  You do not have any signs of a tickborne illness.  Please let us know if you develop any worsening rash, headache, body aches, fevers, or chills.  Return if symptoms worsen or fail to improve.   Take care, Dr Jimmey Ralph  PLEASE NOTE:  If you had any lab tests, please let us know if you have not heard back within a few days. You may see your results on mychart before we have a chance to review them but we will give you a call once they are reviewed by [place].   If we ordered any referrals today, please let us know if you have not heard from their office within the next week.   If you had any urgent prescriptions sent in today, please check with the pharmacy within an hour of our visit to make sure the prescription was transmitted appropriately.   Please try these tips to maintain a healthy lifestyle:  Eat at least 3 REAL meals and 1-2 snacks per day.  Aim for no more than 5 hours between eating.  If you eat breakfast, please do so within one hour of getting up.   Each meal should contain half fruits/vegetables, one quarter protein, and one quarter carbs (no bigger than a computer mouse)  Cut down on sweet beverages. This includes juice, soda, and sweet tea.   Drink at least 1 glass of water with each meal and aim for at least 8 glasses per day  Exercise at least 150 minutes every week.

## 2022-07-31 NOTE — Assessment & Plan Note (Signed)
Continue Lipitor 20 mg daily.  Check lipids later this year at annual follow-up visit.  He is tolerating this well.

## 2022-08-03 ENCOUNTER — Other Ambulatory Visit: Payer: Self-pay | Admitting: Family Medicine

## 2022-08-20 DIAGNOSIS — R7303 Prediabetes: Secondary | ICD-10-CM | POA: Diagnosis not present

## 2022-08-20 DIAGNOSIS — R609 Edema, unspecified: Secondary | ICD-10-CM | POA: Diagnosis not present

## 2022-08-20 DIAGNOSIS — Z1330 Encounter for screening examination for mental health and behavioral disorders, unspecified: Secondary | ICD-10-CM | POA: Diagnosis not present

## 2022-08-20 DIAGNOSIS — Z125 Encounter for screening for malignant neoplasm of prostate: Secondary | ICD-10-CM | POA: Diagnosis not present

## 2022-08-20 DIAGNOSIS — R011 Cardiac murmur, unspecified: Secondary | ICD-10-CM | POA: Diagnosis not present

## 2022-08-20 DIAGNOSIS — I4811 Longstanding persistent atrial fibrillation: Secondary | ICD-10-CM | POA: Diagnosis not present

## 2022-08-20 DIAGNOSIS — G4733 Obstructive sleep apnea (adult) (pediatric): Secondary | ICD-10-CM | POA: Diagnosis not present

## 2022-08-20 DIAGNOSIS — E7800 Pure hypercholesterolemia, unspecified: Secondary | ICD-10-CM | POA: Diagnosis not present

## 2022-08-22 DIAGNOSIS — L570 Actinic keratosis: Secondary | ICD-10-CM | POA: Diagnosis not present

## 2022-08-22 DIAGNOSIS — L814 Other melanin hyperpigmentation: Secondary | ICD-10-CM | POA: Diagnosis not present

## 2022-08-22 DIAGNOSIS — L821 Other seborrheic keratosis: Secondary | ICD-10-CM | POA: Diagnosis not present

## 2022-08-22 DIAGNOSIS — Z85828 Personal history of other malignant neoplasm of skin: Secondary | ICD-10-CM | POA: Diagnosis not present

## 2022-08-22 DIAGNOSIS — D2271 Melanocytic nevi of right lower limb, including hip: Secondary | ICD-10-CM | POA: Diagnosis not present

## 2022-08-22 DIAGNOSIS — D225 Melanocytic nevi of trunk: Secondary | ICD-10-CM | POA: Diagnosis not present

## 2022-08-22 DIAGNOSIS — D2372 Other benign neoplasm of skin of left lower limb, including hip: Secondary | ICD-10-CM | POA: Diagnosis not present

## 2022-08-30 ENCOUNTER — Ambulatory Visit: Payer: Medicare Other | Admitting: Hematology & Oncology

## 2022-08-30 ENCOUNTER — Other Ambulatory Visit: Payer: Medicare Other

## 2022-09-06 ENCOUNTER — Encounter: Payer: Self-pay | Admitting: Hematology & Oncology

## 2022-09-06 ENCOUNTER — Inpatient Hospital Stay (HOSPITAL_BASED_OUTPATIENT_CLINIC_OR_DEPARTMENT_OTHER): Payer: Medicare Other | Admitting: Hematology & Oncology

## 2022-09-06 ENCOUNTER — Inpatient Hospital Stay: Payer: Medicare Other | Attending: Hematology & Oncology

## 2022-09-06 VITALS — BP 112/66 | HR 88 | Temp 98.1°F | Resp 20 | Ht 73.0 in | Wt 229.4 lb

## 2022-09-06 DIAGNOSIS — D5912 Cold autoimmune hemolytic anemia: Secondary | ICD-10-CM

## 2022-09-06 DIAGNOSIS — Z79899 Other long term (current) drug therapy: Secondary | ICD-10-CM | POA: Insufficient documentation

## 2022-09-06 LAB — CMP (CANCER CENTER ONLY)
ALT: 16 U/L (ref 0–44)
AST: 16 U/L (ref 15–41)
Albumin: 4.1 g/dL (ref 3.5–5.0)
Alkaline Phosphatase: 65 U/L (ref 38–126)
Anion gap: 6 (ref 5–15)
BUN: 16 mg/dL (ref 8–23)
CO2: 32 mmol/L (ref 22–32)
Calcium: 9.5 mg/dL (ref 8.9–10.3)
Chloride: 104 mmol/L (ref 98–111)
Creatinine: 0.92 mg/dL (ref 0.61–1.24)
GFR, Estimated: >60 mL/min (ref >60)
Glucose, Bld: 149 mg/dL — ABNORMAL HIGH (ref 70–99)
Potassium: 3.6 mmol/L (ref 3.5–5.1)
Sodium: 142 mmol/L (ref 135–145)
Total Bilirubin: 2.9 mg/dL — ABNORMAL HIGH (ref 0.3–1.2)
Total Protein: 6.3 g/dL — ABNORMAL LOW (ref 6.5–8.1)

## 2022-09-06 LAB — CBC WITH DIFFERENTIAL (CANCER CENTER ONLY)
Abs Immature Granulocytes: 0.05 10*3/uL (ref 0.00–0.07)
Basophils Absolute: 0 10*3/uL (ref 0.0–0.1)
Basophils Relative: 1 %
Eosinophils Absolute: 0.1 10*3/uL (ref 0.0–0.5)
Eosinophils Relative: 1 %
HCT: UNDETERMINED % (ref 39.0–52.0)
Hemoglobin: 13 g/dL (ref 13.0–17.0)
Immature Granulocytes: 1 %
Lymphocytes Relative: 28 %
Lymphs Abs: 1.7 10*3/uL (ref 0.7–4.0)
MCH: UNDETERMINED pg (ref 26.0–34.0)
MCHC: UNDETERMINED g/dL (ref 30.0–36.0)
MCV: UNDETERMINED fL (ref 80.0–100.0)
Monocytes Absolute: 0.5 10*3/uL (ref 0.1–1.0)
Monocytes Relative: 8 %
Neutro Abs: 3.7 10*3/uL (ref 1.7–7.7)
Neutrophils Relative %: 61 %
Platelet Count: 190 10*3/uL (ref 150–400)
RBC: UNDETERMINED MIL/uL (ref 4.22–5.81)
RDW: UNDETERMINED % (ref 11.5–15.5)
WBC Count: 6 10*3/uL (ref 4.0–10.5)
nRBC: UNDETERMINED % (ref 0.0–0.2)

## 2022-09-06 LAB — LACTATE DEHYDROGENASE: LDH: 142 U/L (ref 98–192)

## 2022-09-06 LAB — RETICULOCYTES

## 2022-09-06 NOTE — Progress Notes (Signed)
Hematology and Oncology Follow Up Visit  Steven Shepard [id number] [birth year] 77 y.o. 09/06/2022   Principle Diagnosis:  Cold agglutinin disease  Current Therapy:   Folic acid 2 mg p.o. daily      Interim History:  Steven Shepard is  back for another visit.  We see him every 6 months.  So far, everything is going well for him.  As always, he is going to the beach.  He goes down there for the Summer.  When we saw him back in December, his cold agglutinin titer was 1: 4096.  He has had no problems with fatigue or weakness.  He does exercise.  He does walk.  He has had no problems with nausea or vomiting.  He has had no headache.  He has had no rashes.  He has a little bit of swelling in the legs.  This may be from the amlodipine.  Does have a significant heart murmur.  I suspect this probably his aortic valve.  He is uses cardiologist in July.  He is very diligent about taking his folic acid.  Overall, I would say that his performance status is probably ECOG 0.    Medications:  Current Outpatient Medications:    acetaminophen (TYLENOL) 650 MG CR tablet, Take 650 mg by mouth 2 (two) times daily as needed for pain., Disp: , Rfl:    amLODipine (NORVASC) 10 MG tablet, TAKE 1 TABLET(10 MG) BY MOUTH DAILY, Disp: 90 tablet, Rfl: 1   atorvastatin (LIPITOR) 20 MG tablet, TAKE 1 TABLET(20 MG) BY MOUTH DAILY, Disp: 90 tablet, Rfl: 3   carvedilol (COREG) 12.5 MG tablet, Take 1 tablet (12.5 mg total) by mouth 2 (two) times daily with a meal. NEED APPOINTMENT, Disp: 180 tablet, Rfl: 0   fexofenadine (ALLEGRA) 180 MG tablet, Take 180 mg by mouth in the morning., Disp: , Rfl:    folic acid (FOLVITE) 800 MCG tablet, Take 800 mcg by mouth 2 (two) times daily., Disp: , Rfl:    furosemide (LASIX) 40 MG tablet, TAKE 1 TABLET BY MOUTH TWICE DAILY AT 8 AM AND AT 2 PM, Disp: 180 tablet, Rfl: 0   Krill Oil 500 MG CAPS, Take 500 mg by mouth daily with supper., Disp: , Rfl:    metFORMIN (GLUCOPHAGE) 500  MG tablet, Take 1 tablet (500 mg total) by mouth daily with breakfast., Disp: 90 tablet, Rfl: 3   Misc Natural Product Nasal (NASAL CLEANSE RINSE MIX NA), Place into the nose in the morning., Disp: , Rfl:    Multiple Vitamins-Minerals (ONE-A-DAY MENS 50+) TABS, Take 1 tablet by mouth daily with breakfast., Disp: , Rfl:    potassium chloride SA (KLOR-CON M) 20 MEQ tablet, Take 1 tablet (20 mEq total) by mouth See admin instructions. Take 20 mEq by mouth at 8 AM and 2 PM, Disp: 180 tablet, Rfl: 3   Psyllium (METAMUCIL FIBER PO), Take by mouth See admin instructions. Mix 1 well-rounded teaspoonful of powder into 4-8 ounces of water with one Emergen-C packet and drink every morning, Disp: , Rfl:    rivaroxaban (XARELTO) 20 MG TABS tablet, TAKE 1 TABLET(20 MG) BY MOUTH DAILY WITH SUPPER, Disp: 90 tablet, Rfl: 1   tamsulosin (FLOMAX) 0.4 MG CAPS capsule, TAKE 1 CAPSULE(0.4 MG) BY MOUTH DAILY, Disp: 90 capsule, Rfl: 1   diphenhydrAMINE-APAP, sleep, (TYLENOL PM EXTRA STRENGTH PO), Take 1 tablet by mouth at bedtime. (Patient not taking: Reported on 09/06/2022), Disp: , Rfl:   Allergies:  Allergies  Allergen Reactions  Ace Inhibitors Other (See Comments)    High K+    Codeine Other (See Comments)    Hallucinations   Spironolactone Other (See Comments)    Increases potassium    Past Medical History, Surgical history, Social history, and Family History were reviewed and updated.  Review of Systems: Review of Systems  Constitutional: Negative.   HENT:  Negative.    Eyes: Negative.   Respiratory: Negative.    Cardiovascular: Negative.   Gastrointestinal: Negative.   Endocrine: Negative.   Genitourinary: Negative.    Musculoskeletal: Negative.   Skin: Negative.   Neurological: Negative.   Hematological: Negative.   Psychiatric/Behavioral: Negative.      Physical Exam:  height is 6\' 1"  (1.854 m) and weight is 229 lb 6.4 oz (104.1 kg). His oral temperature is 98.1 F (36.7 C). His blood  pressure is 112/66 and his pulse is 88. His respiration is 20 and oxygen saturation is 98%.   Wt Readings from Last 3 Encounters:  09/06/22 229 lb 6.4 oz (104.1 kg)  07/31/22 230 lb (104.3 kg)  02/28/22 233 lb 1.3 oz (105.7 kg)    Physical Exam Vitals reviewed.  HENT:     Head: Normocephalic and atraumatic.  Eyes:     Pupils: Pupils are equal, round, and reactive to light.  Cardiovascular:     Rate and Rhythm: Normal rate and regular rhythm.     Heart sounds: Normal heart sounds.  Pulmonary:     Effort: Pulmonary effort is normal.     Breath sounds: Normal breath sounds.  Abdominal:     General: Bowel sounds are normal.     Palpations: Abdomen is soft.  Musculoskeletal:        General: No tenderness or deformity. Normal range of motion.     Cervical back: Normal range of motion.  Lymphadenopathy:     Cervical: No cervical adenopathy.  Skin:    General: Skin is warm and dry.     Findings: No erythema or rash.  Neurological:     Mental Status: He is alert and oriented to person, place, and time.  Psychiatric:        Behavior: Behavior normal.        Thought Content: Thought content normal.        Judgment: Judgment normal.      Lab Results  Component Value Date   WBC 5.3 02/28/2022   HGB 12.6 (L) 02/28/2022   HCT Unable to determine due to a cold agglutinin 02/28/2022   MCV Unable to determine due to a cold agglutinin 02/28/2022   PLT 206 02/28/2022     Chemistry      Component Value Date/Time   NA 142 09/06/2022 0955   K 3.6 09/06/2022 0955   CL 104 09/06/2022 0955   CO2 32 09/06/2022 0955   BUN 16 09/06/2022 0955   CREATININE 0.92 09/06/2022 0955   CREATININE 0.89 01/13/2020 0906      Component Value Date/Time   CALCIUM 9.5 09/06/2022 0955   ALKPHOS 65 09/06/2022 0955   AST 16 09/06/2022 0955   ALT 16 09/06/2022 0955   BILITOT 2.9 (H) 09/06/2022 0955      Impression and Plan: Steven Shepard is a very nice 77 year old white male.  He has cold  agglutinin disease.  As far as I can tell, this is idiopathic.  There is no associated lymphoproliferative disease or autoimmune disease that we have been able to find..  It is no surprise that his hemoglobin  is so good right now.  The summertime always gets his blood count up.  We will plan to get him back in 6 more months, as always,  If, for some reason, he will ever need to have heart surgery, we will have to be involved just because of his cold agglutinin.  Josph Macho, MD 6/27/202410:48 AM

## 2022-09-10 LAB — COLD AGGLUTININ TITER: Cold Agglutinin Titer: 1:1024 {titer} — ABNORMAL HIGH

## 2022-09-11 DIAGNOSIS — R7303 Prediabetes: Secondary | ICD-10-CM | POA: Diagnosis not present

## 2022-09-11 DIAGNOSIS — E7800 Pure hypercholesterolemia, unspecified: Secondary | ICD-10-CM | POA: Diagnosis not present

## 2022-09-12 ENCOUNTER — Other Ambulatory Visit (HOSPITAL_BASED_OUTPATIENT_CLINIC_OR_DEPARTMENT_OTHER): Payer: Self-pay | Admitting: Cardiology

## 2022-09-12 ENCOUNTER — Other Ambulatory Visit: Payer: Self-pay | Admitting: Family Medicine

## 2022-09-12 DIAGNOSIS — I5031 Acute diastolic (congestive) heart failure: Secondary | ICD-10-CM

## 2022-09-12 DIAGNOSIS — I480 Paroxysmal atrial fibrillation: Secondary | ICD-10-CM

## 2022-09-12 NOTE — Telephone Encounter (Signed)
Rx request sent to pharmacy.  

## 2022-09-18 IMAGING — RF DG HIP (WITH PELVIS) OPERATIVE*L*
1 series · 4 of 4 positions shown · non-contrast
Comparison: CT Abdomen and Pelvis 12/14/2014.

CLINICAL DATA: 75-year-old male undergoing left hip replacement.
Pre-existing right total hip arthroplasty.

EXAM:
OPERATIVE choose 1 HIP (WITH PELVIS IF PERFORMED) 4 VIEWS
TECHNIQUE: Fluoroscopic spot image(s) were submitted for interpretation
post-operatively.

[Series 1: unknown protocol · 0.20mm/px · 4 of 4 slices shown]
[im 1/4]
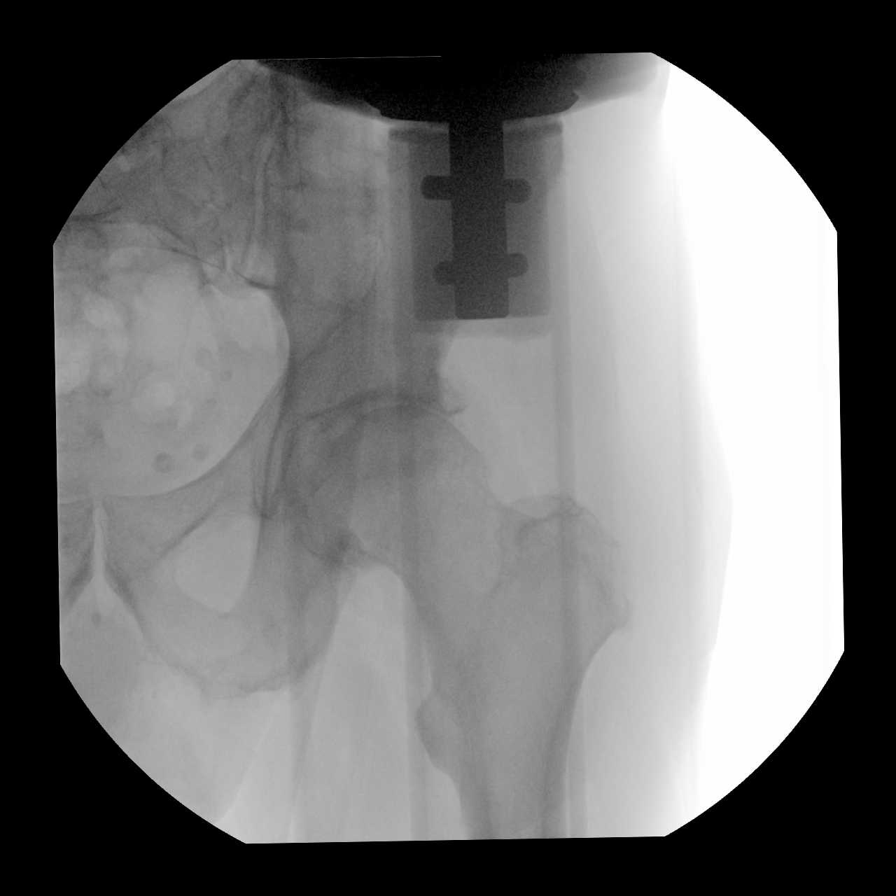
[im 2/4]
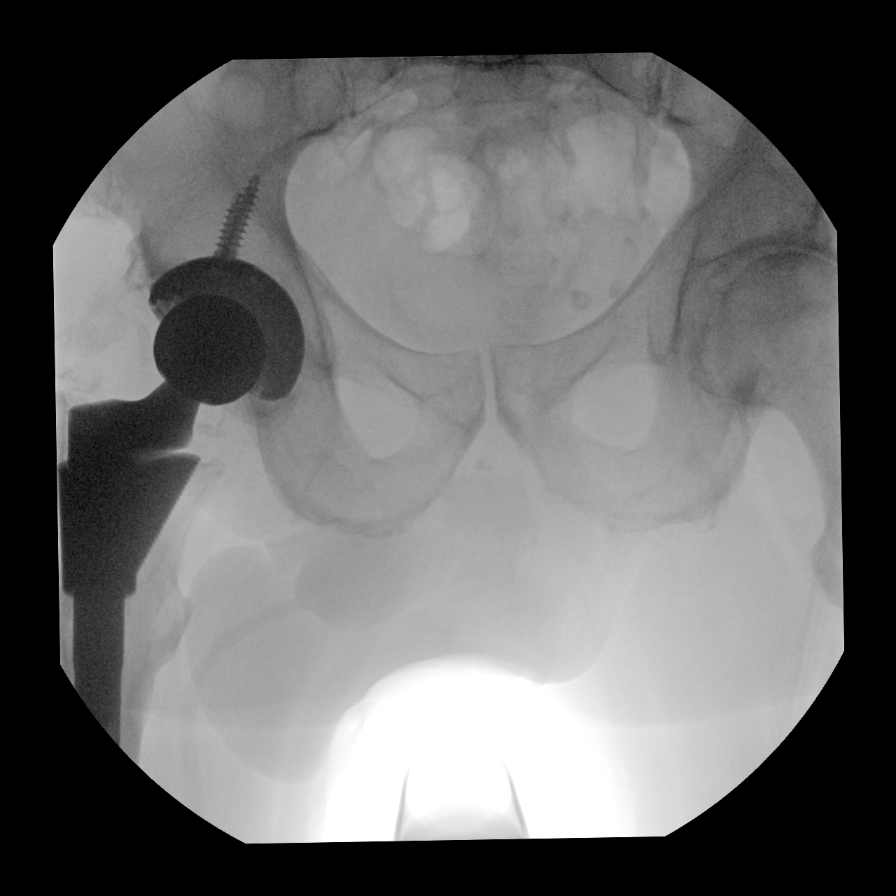
[im 3/4]
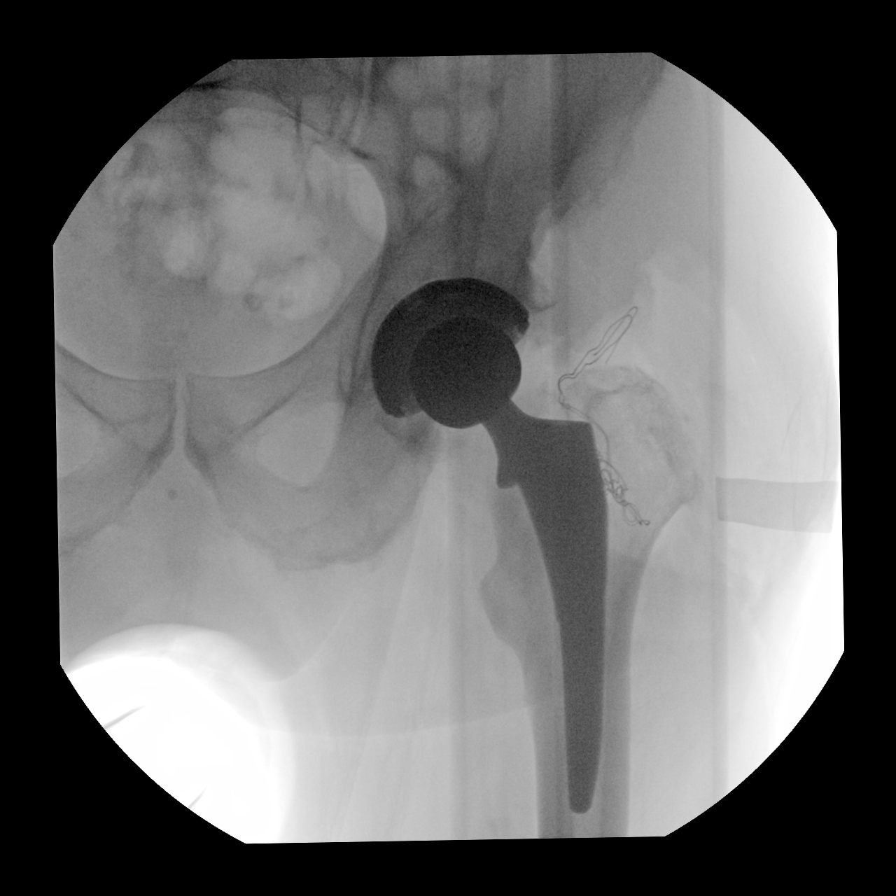
[im 4/4]
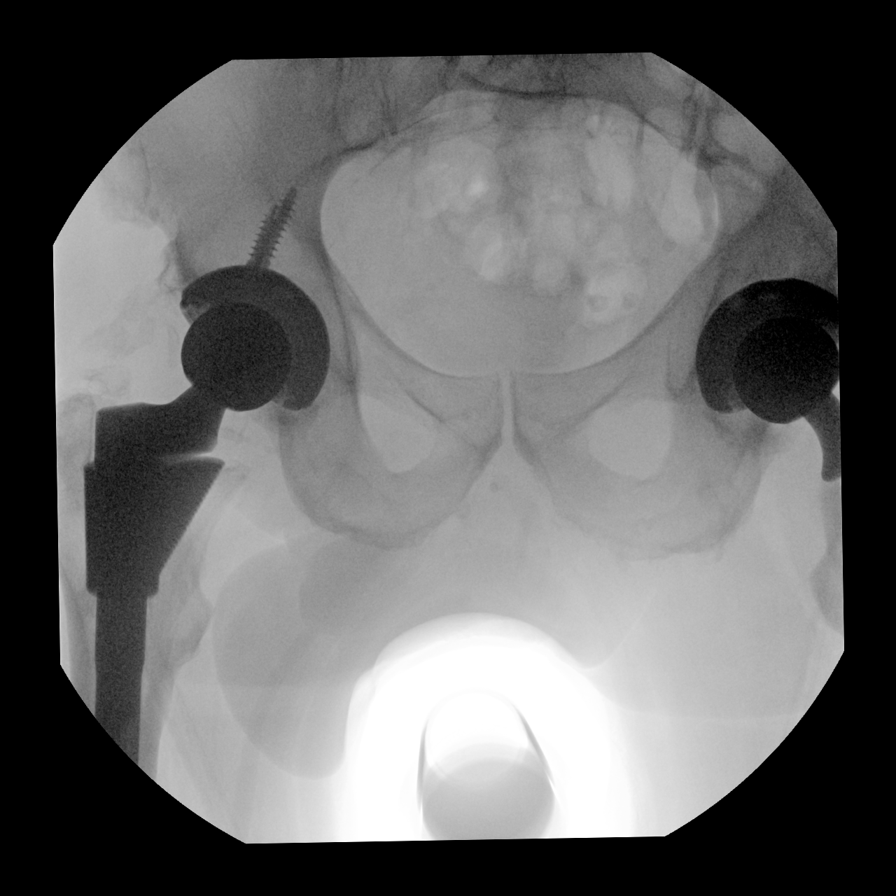

[4 of 4 positions shown; findings below may reference images not displayed]

FINDINGS: Four intraoperative fluoroscopic AP spot images of the left hip and
lower pelvis demonstrate pre-existing right total hip arthroplasty,
placement of bipolar type left hip arthroplasty. Normal hardware AP
alignment.

FLUOROSCOPY TIME:  0 minutes 7 seconds
IMPRESSION: Intraoperative images of left bipolar hip arthroplasty.

## 2022-09-18 IMAGING — DX DG PORTABLE PELVIS
1 series · 1 of 1 positions shown · non-contrast
Comparison: 03/15/2021 intraoperative images

CLINICAL DATA: Status post left hip replacement.

EXAM:
PORTABLE PELVIS 1-2 VIEWS

[pelvis ap]
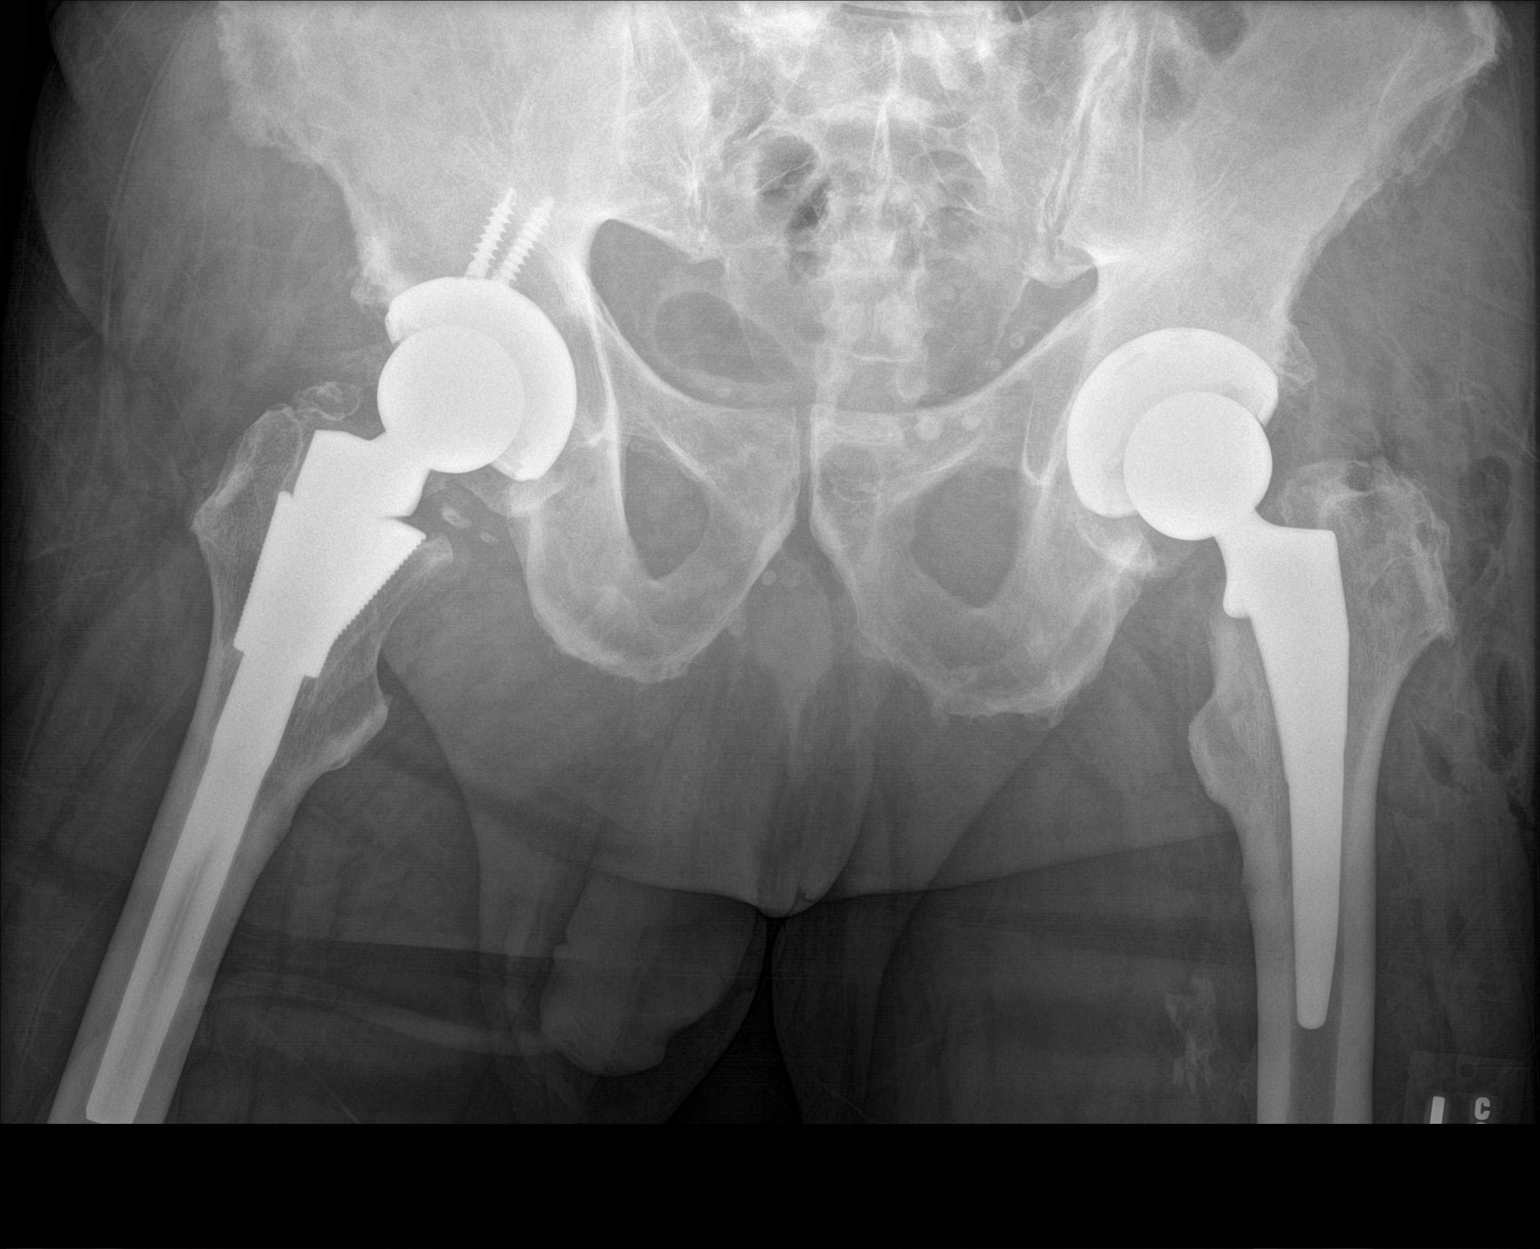

[1 of 1 positions shown; findings below may reference images not displayed]

FINDINGS: Status post left total hip arthroplasty, in near anatomic alignment.
Redemonstrated prior right total hip arthroplasty. There is no
evidence of pelvic fracture or diastasis. No pelvic bone lesions are
seen. Air in the overlying soft tissues, not unexpected
postoperatively.
IMPRESSION: Expected postoperative appearance of left total hip arthroplasty.

## 2022-09-24 ENCOUNTER — Other Ambulatory Visit (HOSPITAL_BASED_OUTPATIENT_CLINIC_OR_DEPARTMENT_OTHER): Payer: Medicare Other

## 2022-10-03 ENCOUNTER — Ambulatory Visit (INDEPENDENT_AMBULATORY_CARE_PROVIDER_SITE_OTHER): Payer: Medicare Other

## 2022-10-03 DIAGNOSIS — I350 Nonrheumatic aortic (valve) stenosis: Secondary | ICD-10-CM | POA: Diagnosis not present

## 2022-10-03 LAB — ECHOCARDIOGRAM COMPLETE
AR max vel: 0.68 cm2
AV Area VTI: 0.59 cm2
AV Area mean vel: 0.61 cm2
AV Mean grad: 31 mmHg
AV Peak grad: 49 mmHg
Ao pk vel: 3.5 m/s
Area-P 1/2: 2.93 cm2
S' Lateral: 2.15 cm

## 2022-10-16 ENCOUNTER — Other Ambulatory Visit: Payer: Self-pay | Admitting: Cardiology

## 2022-10-16 DIAGNOSIS — I480 Paroxysmal atrial fibrillation: Secondary | ICD-10-CM

## 2022-10-16 NOTE — Telephone Encounter (Signed)
Prescription refill request for Xarelto received.  Indication:afib Last office visit:upcoming Weight:104.1  kg Age:77 Scr:0.8  7/24 CrCl:117.47  ml/min  Prescription refilled

## 2022-10-30 ENCOUNTER — Ambulatory Visit (INDEPENDENT_AMBULATORY_CARE_PROVIDER_SITE_OTHER): Payer: Medicare Other | Admitting: Cardiology

## 2022-10-30 ENCOUNTER — Encounter (HOSPITAL_BASED_OUTPATIENT_CLINIC_OR_DEPARTMENT_OTHER): Payer: Self-pay | Admitting: Cardiology

## 2022-10-30 VITALS — BP 118/64 | HR 90 | Ht 73.0 in | Wt 231.9 lb

## 2022-10-30 DIAGNOSIS — Z01812 Encounter for preprocedural laboratory examination: Secondary | ICD-10-CM | POA: Diagnosis not present

## 2022-10-30 DIAGNOSIS — I480 Paroxysmal atrial fibrillation: Secondary | ICD-10-CM

## 2022-10-30 DIAGNOSIS — Z7901 Long term (current) use of anticoagulants: Secondary | ICD-10-CM | POA: Diagnosis not present

## 2022-10-30 DIAGNOSIS — I350 Nonrheumatic aortic (valve) stenosis: Secondary | ICD-10-CM

## 2022-10-30 DIAGNOSIS — D6869 Other thrombophilia: Secondary | ICD-10-CM | POA: Diagnosis not present

## 2022-10-30 DIAGNOSIS — I110 Hypertensive heart disease with heart failure: Secondary | ICD-10-CM

## 2022-10-30 DIAGNOSIS — I10 Essential (primary) hypertension: Secondary | ICD-10-CM

## 2022-10-30 NOTE — H&P (View-Only) (Signed)
Cardiology Office Note:  .    Date:  10/30/2022  ID:  Steven Shepard, DOB 1945-03-13, MRN [id number] PCP: Ardith Dark, MD  [facility] Providers Cardiologist:  Jodelle Red, MD     History of Present Illness: .    Steven Shepard is a 77 y.o. male with a hx of paroxysmal atrial fibrillation, hemolytic anemia, aortic stenosis, cold agglutinin disease, COPD, hyperlipidemia, hypertension, OSA on CPAP, and pre-diabetes, who is seen for follow-up. He was previously a patient of Dr. Delton See and Jacolyn Reedy.   Family history: His father (27 yo) and mother (34 yo) died of heart attacks. Prior cardiac testing and/or incidental findings on other testing (ie coronary calcium): Heart Cath (20-30 yrs ago), no stents Exercise level: Regularly walks his dog. Reports mild chest pain and shortness of breath due to deconditioning.   At his visit 04/03/2021, he was doing well since his hip surgery earlier that month. Was completing home PT. Reported several very brief palpitations, subsided with taking deep breaths. No significant sensations perioperatively. Tolerating rivaroxaban.   Repeat echo completed 09/2022 revealing LVEF 60-65%, mild LVH, indeterminate diastolic parameters. Left and right atrial size mildly dilated. Trivial mitral regurgitation. Moderate to severe aortic valve stenosis. Compared to prior TTE in 2022, the mean AoV gradient is now (previously ) with Vmax 3.66m/s (previously 3.1m/s). There is mild dilatation of the aortic root, measuring 40 mm. There is mild dilatation of the ascending aorta, measuring 41 mm. He was also noted to be in Afib.    Today, he states he is feeling fair. For a couple months he has noticed that sometimes he may become short winded with activities such as walking upstairs. He feels winded but doesn't need to stop. He denies shortness of breath walking on level ground. Additionally he also has swelling in his legs. Typically he  sleeps with his legs elevated. In the mornings his swelling will improve. He does own compression socks at home, but it has been more difficult to tolerate them during the Summer.  At times he may feel mild palpitations, improved with taking deep breaths. He confirms that he is not always able to tell that he is in Afib.  He denies any chest pain, lightheadedness, headaches, syncope, orthopnea, or PND.  ROS:  Please see the history of present illness. ROS otherwise negative except as noted.  (+) Exertional shortness of breath (+) BLE edema (+) Occasional palpitations (+) Fatigue  Studies Reviewed: Marland Kitchen    EKG Interpretation Date/Time:  Tuesday October 30 2022 11:29:14 EDT Ventricular Rate:  90 PR Interval:    QRS Duration:  100 QT Interval:  344 QTC Calculation: 420 R Axis:   12  Text Interpretation: Atrial fibrillation When compared with ECG of 05-Jan-2010 05:16, Atrial fibrillation has replaced Sinus rhythm Confirmed by Jodelle Red [id number]) on 10/30/2022 12:04:26 PM    Echo  10/03/2022: 1. Left ventricular ejection fraction, by estimation, is 60 to 65%. The  left ventricle has normal function. The left ventricle has no regional  wall motion abnormalities. There is mild concentric left ventricular  hypertrophy. Left ventricular diastolic  parameters are indeterminate.   2. Right ventricular systolic function is normal. The right ventricular  size is normal.   3. Left atrial size was mildly dilated.   4. Right atrial size was mildly dilated.   5. The mitral valve is grossly normal. Trivial mitral valve  regurgitation.   6. LVOT VTI underestimated due to suboptimal PW doppler placement and  will overestimate severity of AS. Based on mean gradient and Vamx, the AS  is within the moderate to severe range and has progressed compared to  prior TTE. The aortic valve is tricuspid. There is severe calcifcation of the aortic valve. There is  severe thickening of the aortic valve.  Aortic valve regurgitation is not  visualized. Moderate to severe aortic valve stenosis. Aortic valve mean  gradient measures 31.0 mmHg. Aortic  valve Vmax measures 3.50 m/s.   7. Aortic dilatation noted. There is mild dilatation of the aortic root,  measuring 40 mm. There is mild dilatation of the ascending aorta,  measuring 41 mm.   8. The inferior vena cava is dilated in size with >50% respiratory  variability, suggesting right atrial pressure of 8 mmHg.   Comparison(s): Compared to prior TTE in 2022, the mean AoV gradient is now  (previously ) with Vmax 3.25m/s (previously 3.2m/s). The  patient is also now in Afib.   Physical Exam:    VS:  BP 118/64 (BP Location: Right Arm, Patient Position: Sitting, Cuff Size: Normal)   Pulse 90   Ht 6\' 1"  (1.854 m)   Wt 231 lb 14.4 oz (105.2 kg)   BMI 30.60 kg/m    Wt Readings from Last 3 Encounters:  10/30/22 231 lb 14.4 oz (105.2 kg)  09/06/22 229 lb 6.4 oz (104.1 kg)  07/31/22 230 lb (104.3 kg)    GEN: Well nourished, well developed in no acute distress HEENT: Normal, moist mucous membranes NECK: No JVD CARDIAC: Irregularly irregular rhythm, normal S1 and S2, no rubs or gallops. 2/6 late-peaking murmur. VASCULAR: Radial and DP pulses 2+ bilaterally. No carotid bruits RESPIRATORY:  Clear to auscultation without rales, wheezing or rhonchi  ABDOMEN: Soft, non-tender, non-distended MUSCULOSKELETAL:  Ambulates independently SKIN: Warm and dry, 1+ LE edema bilaterally (L>R) NEUROLOGIC:  Alert and oriented x 3. No focal neuro deficits noted. PSYCHIATRIC:  Normal affect   ASSESSMENT AND PLAN: .    Paroxysmal atrial fibrillation: -CHA2DS2/VAS Stroke Risk Points=4  -tolerating rivaroxaban -in afib, discussed cardioversion, he is amenable -counseled on monitoring for bleeding -continue CPAP for OSA  Informed Consent   Shared Decision Making/Informed Consent The risks (stroke, cardiac arrhythmias rarely resulting in the need  for a temporary or permanent pacemaker, skin irritation or burns and complications associated with conscious sedation including aspiration, arrhythmia, respiratory failure and death), benefits (restoration of normal sinus rhythm) and alternatives of a direct current cardioversion were explained in detail to Steven Shepard and he agrees to proceed.         Moderate aortic stenosis Mild dilation of ascending aorta Elevated ASCVD risk -continue atorvastatin -last echo 09/2020, repeat 09/2022 -reviewed symptoms that need to be reported to me   Hypertension: -at goal today -continue amlodipine 10 mg daily, carvedilol 12.5 mg daily, furosemide 40 mg daily -reports he cannot take ACEi/MRA due to hyperkalemia, but he is currently on a potassium supplement with his furosemide. Could reassess cautiously in the future if needed   Cardiac risk counseling and prevention recommendations: -recommend heart healthy/Mediterranean diet, with whole grains, fruits, vegetable, fish, lean meats, nuts, and olive oil. Limit salt. -recommend moderate walking, 3-5 times/week for 30-50 minutes each session. Aim for at least 150 minutes.week. Goal should be pace of 3 miles/hours, or walking 1.5 miles in 30 minutes -recommend avoidance of tobacco products. Avoid excess alcohol.  Dispo: Follow-up in 6-8 weeks, or sooner as needed.  I,Mathew Stumpf,acting as a Neurosurgeon for [employer], MD.,have documented  all relevant documentation on the behalf of Jodelle Red, MD,as directed by  Jodelle Red, MD while in the presence of Jodelle Red, MD.  I, Jodelle Red, MD, have reviewed all documentation for this visit. The documentation on 11/07/22 for the exam, diagnosis, procedures, and orders are all accurate and complete.   Signed, Jodelle Red, MD

## 2022-10-30 NOTE — Patient Instructions (Addendum)
Medication Instructions:  Your physician recommends that you continue on your current medications as directed. Please refer to the Current Medication list given to you today.   *If you need a refill on your cardiac medications before your next appointment, please call your pharmacy*  Lab Work: BMET/CBC TODAY   If you have labs (blood work) drawn today and your tests are completely normal, you will receive your results only by: MyChart Message (if you have MyChart) OR A paper copy in the mail If you have any lab test that is abnormal or we need to change your treatment, we will call you to review the results.  Testing/Procedures: CARDIOVERSION 11/07/2022   Follow-Up: 11/19/2022 AT 2:20 PM WITH DR Cristal Deer   Other Instructions  You are scheduled for a Cardioversion on Wednesday, August 28 with Dr. Cristal Deer.  Please arrive at the [facility] (Main Entrance A) at [facility]: [address] [place], [place] [address] at 7:30 AM (This time is 1 hour(s) before your procedure to ensure your preparation). Free valet parking service is available. You will check in at ADMITTING. The support person will be asked to wait in the waiting room.  It is OK to have someone drop you off and come back when you are ready to be discharged.     DIET:  Nothing to eat or drink after midnight except a sip of water with medications (see medication instructions below)  MEDICATION INSTRUCTIONS: !!IF ANY NEW MEDICATIONS ARE STARTED AFTER TODAY, PLEASE NOTIFY YOUR PROVIDER AS SOON AS POSSIBLE!!  FYI: Medications such as Semaglutide (Ozempic, [place]), Tirzepatide (Mounjaro, Zepbound), Dulaglutide (Trulicity), etc ("GLP1 agonists") AND Canagliflozin (Invokana), Dapagliflozin (Farxiga), Empagliflozin (Jardiance), Ertugliflozin (Steglatro), Bexagliflozin [employer]) or any combination with one of these drugs such as Invokamet (Canagliflozin/Metformin), Synjardy (Empagliflozin/Metformin), etc ("SGLT2 inhibitors")  must be held around the time of a procedure. This is not a comprehensive list of all of these drugs. Please review all of your medications and talk to your provider if you take any one of these. If you are not sure, ask your provider.  Continue taking your anticoagulant (blood thinner): Rivaroxaban (Xarelto).   DO NOT TAKE METFORMIN MORNING OF   You will need to continue this after your procedure until you are told by your provider that it is safe to stop.    LABS: TODAY   FYI:  For your safety, and to allow [place] to monitor your vital signs accurately during the surgery/procedure we request: If you have artificial nails, gel coating, SNS etc, please have those removed prior to your surgery/procedure. Not having the nail coverings /polish removed may result in cancellation or delay of your surgery/procedure.  You must have a responsible person to drive you home and stay in the waiting area during your procedure. Failure to do so could result in cancellation.  Bring your insurance cards.  *Special Note: Every effort is made to have your procedure done on time. Occasionally there are emergencies that occur at the hospital that may cause delays. Please be patient if a delay does occur.

## 2022-10-30 NOTE — Progress Notes (Signed)
Cardiology Office Note:  .    Date:  10/30/2022  ID:  Steven Shepard, DOB 11-28-1945, MRN [id number] PCP: Ardith Dark, MD  [facility] Providers Cardiologist:  Jodelle Red, MD     History of Present Illness: .    Steven Shepard is a 77 y.o. male with a hx of paroxysmal atrial fibrillation, hemolytic anemia, aortic stenosis, cold agglutinin disease, COPD, hyperlipidemia, hypertension, OSA on CPAP, and pre-diabetes, who is seen for follow-up. He was previously a patient of Dr. Delton See and Jacolyn Reedy.   Family history: His father (68 yo) and mother (85 yo) died of heart attacks. Prior cardiac testing and/or incidental findings on other testing (ie coronary calcium): Heart Cath (20-30 yrs ago), no stents Exercise level: Regularly walks his dog. Reports mild chest pain and shortness of breath due to deconditioning.   At his visit 04/03/2021, he was doing well since his hip surgery earlier that month. Was completing home PT. Reported several very brief palpitations, subsided with taking deep breaths. No significant sensations perioperatively. Tolerating rivaroxaban.   Repeat echo completed 09/2022 revealing LVEF 60-65%, mild LVH, indeterminate diastolic parameters. Left and right atrial size mildly dilated. Trivial mitral regurgitation. Moderate to severe aortic valve stenosis. Compared to prior TTE in 2022, the mean AoV gradient is now (previously ) with Vmax 3.35m/s (previously 3.19m/s). There is mild dilatation of the aortic root, measuring 40 mm. There is mild dilatation of the ascending aorta, measuring 41 mm. He was also noted to be in Afib.    Today, he states he is feeling fair. For a couple months he has noticed that sometimes he may become short winded with activities such as walking upstairs. He feels winded but doesn't need to stop. He denies shortness of breath walking on level ground. Additionally he also has swelling in his legs. Typically he  sleeps with his legs elevated. In the mornings his swelling will improve. He does own compression socks at home, but it has been more difficult to tolerate them during the Summer.  At times he may feel mild palpitations, improved with taking deep breaths. He confirms that he is not always able to tell that he is in Afib.  He denies any chest pain, lightheadedness, headaches, syncope, orthopnea, or PND.  ROS:  Please see the history of present illness. ROS otherwise negative except as noted.  (+) Exertional shortness of breath (+) BLE edema (+) Occasional palpitations (+) Fatigue  Studies Reviewed: Marland Kitchen    EKG Interpretation Date/Time:  Tuesday October 30 2022 11:29:14 EDT Ventricular Rate:  90 PR Interval:    QRS Duration:  100 QT Interval:  344 QTC Calculation: 420 R Axis:   12  Text Interpretation: Atrial fibrillation When compared with ECG of 05-Jan-2010 05:16, Atrial fibrillation has replaced Sinus rhythm Confirmed by Jodelle Red [phone]) on 10/30/2022 12:04:26 PM    Echo  10/03/2022: 1. Left ventricular ejection fraction, by estimation, is 60 to 65%. The  left ventricle has normal function. The left ventricle has no regional  wall motion abnormalities. There is mild concentric left ventricular  hypertrophy. Left ventricular diastolic  parameters are indeterminate.   2. Right ventricular systolic function is normal. The right ventricular  size is normal.   3. Left atrial size was mildly dilated.   4. Right atrial size was mildly dilated.   5. The mitral valve is grossly normal. Trivial mitral valve  regurgitation.   6. LVOT VTI underestimated due to suboptimal PW doppler placement and  will overestimate severity of AS. Based on mean gradient and Vamx, the AS  is within the moderate to severe range and has progressed compared to  prior TTE. The aortic valve is tricuspid. There is severe calcifcation of the aortic valve. There is  severe thickening of the aortic valve.  Aortic valve regurgitation is not  visualized. Moderate to severe aortic valve stenosis. Aortic valve mean  gradient measures 31.0 mmHg. Aortic  valve Vmax measures 3.50 m/s.   7. Aortic dilatation noted. There is mild dilatation of the aortic root,  measuring 40 mm. There is mild dilatation of the ascending aorta,  measuring 41 mm.   8. The inferior vena cava is dilated in size with >50% respiratory  variability, suggesting right atrial pressure of 8 mmHg.   Comparison(s): Compared to prior TTE in 2022, the mean AoV gradient is now  (previously ) with Vmax 3.18m/s (previously 3.58m/s). The  patient is also now in Afib.   Physical Exam:    VS:  BP 118/64 (BP Location: Right Arm, Patient Position: Sitting, Cuff Size: Normal)   Pulse 90   Ht 6\' 1"  (1.854 m)   Wt 231 lb 14.4 oz (105.2 kg)   BMI 30.60 kg/m    Wt Readings from Last 3 Encounters:  10/30/22 231 lb 14.4 oz (105.2 kg)  09/06/22 229 lb 6.4 oz (104.1 kg)  07/31/22 230 lb (104.3 kg)    GEN: Well nourished, well developed in no acute distress HEENT: Normal, moist mucous membranes NECK: No JVD CARDIAC: Irregularly irregular rhythm, normal S1 and S2, no rubs or gallops. 2/6 late-peaking murmur. VASCULAR: Radial and DP pulses 2+ bilaterally. No carotid bruits RESPIRATORY:  Clear to auscultation without rales, wheezing or rhonchi  ABDOMEN: Soft, non-tender, non-distended MUSCULOSKELETAL:  Ambulates independently SKIN: Warm and dry, 1+ LE edema bilaterally (L>R) NEUROLOGIC:  Alert and oriented x 3. No focal neuro deficits noted. PSYCHIATRIC:  Normal affect   ASSESSMENT AND PLAN: .    Paroxysmal atrial fibrillation: -CHA2DS2/VAS Stroke Risk Points=4  -tolerating rivaroxaban -in afib, discussed cardioversion, he is amenable -counseled on monitoring for bleeding -continue CPAP for OSA  Informed Consent   Shared Decision Making/Informed Consent The risks (stroke, cardiac arrhythmias rarely resulting in the need  for a temporary or permanent pacemaker, skin irritation or burns and complications associated with conscious sedation including aspiration, arrhythmia, respiratory failure and death), benefits (restoration of normal sinus rhythm) and alternatives of a direct current cardioversion were explained in detail to Mr. Rhein and he agrees to proceed.         Moderate aortic stenosis Mild dilation of ascending aorta Elevated ASCVD risk -continue atorvastatin -last echo 09/2020, repeat 09/2022 -reviewed symptoms that need to be reported to me   Hypertension: -at goal today -continue amlodipine 10 mg daily, carvedilol 12.5 mg daily, furosemide 40 mg daily -reports he cannot take ACEi/MRA due to hyperkalemia, but he is currently on a potassium supplement with his furosemide. Could reassess cautiously in the future if needed   Cardiac risk counseling and prevention recommendations: -recommend heart healthy/Mediterranean diet, with whole grains, fruits, vegetable, fish, lean meats, nuts, and olive oil. Limit salt. -recommend moderate walking, 3-5 times/week for 30-50 minutes each session. Aim for at least 150 minutes.week. Goal should be pace of 3 miles/hours, or walking 1.5 miles in 30 minutes -recommend avoidance of tobacco products. Avoid excess alcohol.  Dispo: Follow-up in 6-8 weeks, or sooner as needed.  I,Mathew Stumpf,acting as a Neurosurgeon for [employer], MD.,have documented  all relevant documentation on the behalf of Jodelle Red, MD,as directed by  Jodelle Red, MD while in the presence of Jodelle Red, MD.  I, Jodelle Red, MD, have reviewed all documentation for this visit. The documentation on 11/07/22 for the exam, diagnosis, procedures, and orders are all accurate and complete.   Signed, Jodelle Red, MD

## 2022-11-01 LAB — BASIC METABOLIC PANEL
BUN/Creatinine Ratio: 15 (ref 10–24)
BUN: 14 mg/dL (ref 8–27)
CO2: 27 mmol/L (ref 20–29)
Calcium: 9.4 mg/dL (ref 8.6–10.2)
Chloride: 104 mmol/L (ref 96–106)
Creatinine, Ser: 0.91 mg/dL (ref 0.76–1.27)
Glucose: 74 mg/dL (ref 70–99)
Potassium: 4.2 mmol/L (ref 3.5–5.2)
Sodium: 143 mmol/L (ref 134–144)
eGFR: 88 mL/min/{1.73_m2} (ref >59)

## 2022-11-01 LAB — CBC WITH DIFFERENTIAL/PLATELET
Basophils Absolute: 0 10*3/uL (ref 0.0–0.2)
Basos: 1 %
EOS (ABSOLUTE): 0.1 10*3/uL (ref 0.0–0.4)
Eos: 1 %
Hematocrit: 40.4 % (ref 37.5–51.0)
Hemoglobin: 13.4 g/dL (ref 13.0–17.7)
Immature Grans (Abs): 0.1 10*3/uL (ref 0.0–0.1)
Immature Granulocytes: 1 %
Lymphocytes Absolute: 2.3 10*3/uL (ref 0.7–3.1)
Lymphs: 32 %
MCH: 31.1 pg (ref 26.6–33.0)
MCHC: 33.2 g/dL (ref 31.5–35.7)
MCV: 94 fL (ref 79–97)
Monocytes Absolute: 0.6 10*3/uL (ref 0.1–0.9)
Monocytes: 9 %
Neutrophils Absolute: 4.2 10*3/uL (ref 1.4–7.0)
Neutrophils: 56 %
Platelets: 201 10*3/uL (ref 150–450)
RBC: 4.31 x10E6/uL (ref 4.14–5.80)
RDW: 13.6 % (ref 11.6–15.4)
WBC: 7.3 10*3/uL (ref 3.4–10.8)

## 2022-11-06 NOTE — Progress Notes (Signed)
Spoke with patient, Procedure scheduled for 0745, Please arrive at the hospital at 0645, NPO after midnight on Tuesday, May take meds with sips of water in the AM, please have transportation for home post procedure, and someone to stay with pt for approximately 24 hours after  Pt states he has been taking Xarelto regularly with no missed doses

## 2022-11-07 ENCOUNTER — Encounter (HOSPITAL_COMMUNITY): Payer: Self-pay | Admitting: Cardiology

## 2022-11-07 ENCOUNTER — Ambulatory Visit (HOSPITAL_COMMUNITY): Admission: RE | Admit: 2022-11-07 | Payer: Medicare Other | Source: Ambulatory Visit | Admitting: Cardiology

## 2022-11-07 ENCOUNTER — Encounter (HOSPITAL_COMMUNITY)
Admission: RE | Disposition: A | Discharge status: Home / Self Care | Payer: Self-pay | Source: Ambulatory Visit | Attending: Cardiology

## 2022-11-07 ENCOUNTER — Ambulatory Visit (HOSPITAL_COMMUNITY)
Admission: RE | Admit: 2022-11-07 | Discharge: 2022-11-07 | Disposition: A | Discharge status: Home / Self Care | Payer: Medicare Other | Source: Ambulatory Visit | Attending: Cardiology | Admitting: Cardiology

## 2022-11-07 ENCOUNTER — Ambulatory Visit (HOSPITAL_COMMUNITY): Payer: Medicare Other | Admitting: Registered Nurse

## 2022-11-07 ENCOUNTER — Ambulatory Visit (HOSPITAL_COMMUNITY): Admit: 2022-11-07 | Payer: Medicare Other | Admitting: Cardiology

## 2022-11-07 ENCOUNTER — Ambulatory Visit (HOSPITAL_BASED_OUTPATIENT_CLINIC_OR_DEPARTMENT_OTHER): Payer: Medicare Other | Admitting: Registered Nurse

## 2022-11-07 ENCOUNTER — Encounter (HOSPITAL_COMMUNITY): Admission: RE | Payer: Self-pay | Source: Ambulatory Visit

## 2022-11-07 ENCOUNTER — Other Ambulatory Visit: Payer: Self-pay

## 2022-11-07 ENCOUNTER — Encounter (HOSPITAL_COMMUNITY): Payer: Self-pay

## 2022-11-07 DIAGNOSIS — Z87891 Personal history of nicotine dependence: Secondary | ICD-10-CM

## 2022-11-07 DIAGNOSIS — R7303 Prediabetes: Secondary | ICD-10-CM | POA: Insufficient documentation

## 2022-11-07 DIAGNOSIS — I10 Essential (primary) hypertension: Secondary | ICD-10-CM | POA: Diagnosis not present

## 2022-11-07 DIAGNOSIS — E785 Hyperlipidemia, unspecified: Secondary | ICD-10-CM | POA: Diagnosis not present

## 2022-11-07 DIAGNOSIS — I480 Paroxysmal atrial fibrillation: Secondary | ICD-10-CM | POA: Insufficient documentation

## 2022-11-07 DIAGNOSIS — I4891 Unspecified atrial fibrillation: Secondary | ICD-10-CM | POA: Diagnosis not present

## 2022-11-07 DIAGNOSIS — I350 Nonrheumatic aortic (valve) stenosis: Secondary | ICD-10-CM | POA: Diagnosis not present

## 2022-11-07 DIAGNOSIS — J449 Chronic obstructive pulmonary disease, unspecified: Secondary | ICD-10-CM | POA: Diagnosis not present

## 2022-11-07 DIAGNOSIS — D5912 Cold autoimmune hemolytic anemia: Secondary | ICD-10-CM | POA: Diagnosis not present

## 2022-11-07 DIAGNOSIS — Z7901 Long term (current) use of anticoagulants: Secondary | ICD-10-CM | POA: Diagnosis not present

## 2022-11-07 DIAGNOSIS — G4733 Obstructive sleep apnea (adult) (pediatric): Secondary | ICD-10-CM | POA: Insufficient documentation

## 2022-11-07 DIAGNOSIS — Z79899 Other long term (current) drug therapy: Secondary | ICD-10-CM | POA: Diagnosis not present

## 2022-11-07 HISTORY — PX: CARDIOVERSION: SHX1299

## 2022-11-07 LAB — GLUCOSE, CAPILLARY: Glucose-Capillary: 120 mg/dL — ABNORMAL HIGH (ref 70–99)

## 2022-11-07 SURGERY — CARDIOVERSION
Anesthesia: General

## 2022-11-07 MED ORDER — LIDOCAINE 2% (20 MG/ML) 5 ML SYRINGE
INTRAMUSCULAR | Status: DC | PRN
  Administered 2022-11-07: 60 mg via INTRAVENOUS

## 2022-11-07 MED ORDER — SODIUM CHLORIDE 0.9 % IV SOLN: INTRAVENOUS | Status: DC

## 2022-11-07 MED ORDER — PROPOFOL 10 MG/ML IV BOLUS
INTRAVENOUS | Status: DC | PRN
  Administered 2022-11-07: 60 mg via INTRAVENOUS

## 2022-11-07 MED ORDER — AMIODARONE HCL 200 MG PO TABS: ORAL_TABLET | ORAL | 0 refills | Status: DC

## 2022-11-07 SURGICAL SUPPLY — 1 items: ELECT DEFIB PAD ADLT CADENCE (PAD) ×1 IMPLANT

## 2022-11-07 NOTE — Transfer of Care (Signed)
Immediate Anesthesia Transfer of Care Note  Patient: Steven Shepard  Procedure(s) Performed: CARDIOVERSION  Patient Location: PACU  Anesthesia Type:MAC  Level of Consciousness: awake, alert , and oriented  Airway & Oxygen Therapy: Patient Spontanous Breathing  Post-op Assessment: Report given to RN and Post -op Vital signs reviewed and stable  Post vital signs: Reviewed and stable  Last Vitals:  Vitals Value Taken Time  BP 123/87   Temp    Pulse 82   Resp    SpO2 94     Last Pain:  Vitals:   11/07/22 0708  TempSrc: Temporal  PainSc: 0-No pain         Complications: There were no known notable events for this encounter.

## 2022-11-07 NOTE — CV Procedure (Signed)
Procedure:   DCCV  Indication:  Symptomatic atrial fibrillation  Procedure Note:  The patient signed informed consent.  They have had had therapeutic anticoagulation with rivaroxaban greater than 3 weeks.  Anesthesia was administered by Dr. Maple Hudson.  Patient received 60 mg IV lidocaine and 60 mg IV propofol.Adequate airway was maintained throughout and vital followed per protocol.  They were cardioverted x 3 with 150, 200, 200J of biphasic synchronized energy with anterior pressure.  There were brief pauses after each shock with a single p wave after, then immediate return to atrial fibrillation.  There were no apparent complications.  The patient had normal neuro status and respiratory status post procedure with vitals stable as recorded elsewhere.    Follow up:  They will continue on current medical therapy and follow up with cardiology as scheduled.  Jodelle Red, MD PhD 11/07/2022 8:17 AM

## 2022-11-07 NOTE — Anesthesia Postprocedure Evaluation (Signed)
Anesthesia Post Note  Patient: Steven Shepard  Procedure(s) Performed: CARDIOVERSION     Patient location during evaluation: Cath Lab Anesthesia Type: General Level of consciousness: awake and alert Pain management: pain level controlled Vital Signs Assessment: post-procedure vital signs reviewed and stable Respiratory status: spontaneous breathing, nonlabored ventilation and respiratory function stable Cardiovascular status: blood pressure returned to baseline and stable Postop Assessment: no apparent nausea or vomiting Anesthetic complications: no   There were no known notable events for this encounter.  Last Vitals:  Vitals:   11/07/22 0830 11/07/22 0835  BP: 112/84 120/84  Pulse: 83 80  Resp: 13 18  Temp:  36.7 C  SpO2: 95% 95%    Last Pain:  Vitals:   11/07/22 0835  TempSrc: Temporal  PainSc: 0-No pain                 Alila Sotero

## 2022-11-07 NOTE — Anesthesia Preprocedure Evaluation (Addendum)
Anesthesia Evaluation  Patient identified by MRN, date of birth, ID band Patient awake    Reviewed: Allergy & Precautions, NPO status , Patient's Chart, lab work & pertinent test results  History of Anesthesia Complications Negative for: history of anesthetic complications  Airway Mallampati: III  TM Distance: >3 FB Neck ROM: Full    Dental  (+) Dental Advisory Given, Teeth Intact   Pulmonary sleep apnea , COPD, former smoker   breath sounds clear to auscultation       Cardiovascular hypertension, Pt. on medications (-) angina + Peripheral Vascular Disease and + DOE  (-) Past MI + dysrhythmias Atrial Fibrillation (-) pacemaker+ Valvular Problems/Murmurs AS  Rhythm:Irregular  1. Left ventricular ejection fraction, by estimation, is 60 to 65%. The  left ventricle has normal function. The left ventricle has no regional  wall motion abnormalities. There is mild concentric left ventricular  hypertrophy. Left ventricular diastolic  parameters are indeterminate.   2. Right ventricular systolic function is normal. The right ventricular  size is normal.   3. Left atrial size was mildly dilated.   4. Right atrial size was mildly dilated.   5. The mitral valve is grossly normal. Trivial mitral valve  regurgitation.   6. LVOT VTI underestimated due to suboptimal PW doppler placement and  will overestimate severity of AS. Based on mean gradient and Vamx, the AS  is within the moderate to severe range and has progressed compared to  prior TTE.. The aortic valve is  tricuspid. There is severe calcifcation of the aortic valve. There is  severe thickening of the aortic valve. Aortic valve regurgitation is not  visualized. Moderate to severe aortic valve stenosis. Aortic valve mean  gradient measures 31.0 mmHg. Aortic  valve Vmax measures 3.50 m/s.   7. Aortic dilatation noted. There is mild dilatation of the aortic root,  measuring 40 mm.  There is mild dilatation of the ascending aorta,  measuring 41 mm.   8. The inferior vena cava is dilated in size with >50% respiratory  variability, suggesting right atrial pressure of 8 mmHg.     Neuro/Psych    GI/Hepatic negative GI ROS,,,(+) Hepatitis -  Endo/Other  negative endocrine ROS    Renal/GU Lab Results      Component                Value               Date                      NA                       143                 10/30/2022                K                        4.2                 10/30/2022                CO2                      27                  10/30/2022  GLUCOSE                  74                  10/30/2022                BUN                      14                  10/30/2022                CREATININE               0.91                10/30/2022                CALCIUM                  9.4                 10/30/2022                GFR                      82.75               02/05/2022                EGFR                     88                  10/30/2022                GFRNONAA                 >60                 09/06/2022                 Musculoskeletal  (+) Arthritis ,    Abdominal   Peds  Hematology  (+) Blood dyscrasia Lab Results      Component                Value               Date                      WBC                      7.3                 10/30/2022                HGB                      13.4                10/30/2022                HCT                      40.4                10/30/2022  MCV                      94                  10/30/2022                PLT                      201                 10/30/2022             xarelto   Anesthesia Other Findings   Reproductive/Obstetrics                             Anesthesia Physical Anesthesia Plan  ASA: 3  Anesthesia Plan: General   Post-op Pain Management: Minimal or no pain anticipated   Induction:  Intravenous  PONV Risk Score and Plan: 2 and Treatment may vary due to age or medical condition  Airway Management Planned: Nasal Cannula, Simple Face Mask and Natural Airway  Additional Equipment: None  Intra-op Plan:   Post-operative Plan:   Informed Consent: I have reviewed the patients History and Physical, chart, labs and discussed the procedure including the risks, benefits and alternatives for the proposed anesthesia with the patient or authorized representative who has indicated his/her understanding and acceptance.     Dental advisory given  Plan Discussed with: CRNA  Anesthesia Plan Comments:         Anesthesia Quick Evaluation

## 2022-11-07 NOTE — Interval H&P Note (Signed)
History and Physical Interval Note:  11/07/2022 8:01 AM  Steven Shepard  has presented today for surgery, with the diagnosis of afib.  The various methods of treatment have been discussed with the patient and family. After consideration of risks, benefits and other options for treatment, the patient has consented to  Procedure(s): CARDIOVERSION (N/A) as a surgical intervention.  The patient's history has been reviewed, patient examined, no change in status, stable for surgery.  I have reviewed the patient's chart and labs.  Questions were answered to the patient's satisfaction.     [name] Cristal Deer

## 2022-11-08 ENCOUNTER — Encounter (HOSPITAL_COMMUNITY): Payer: Self-pay | Admitting: Cardiology

## 2022-11-19 ENCOUNTER — Ambulatory Visit (HOSPITAL_BASED_OUTPATIENT_CLINIC_OR_DEPARTMENT_OTHER): Payer: Medicare Other | Admitting: Cardiology

## 2022-11-19 ENCOUNTER — Encounter (HOSPITAL_BASED_OUTPATIENT_CLINIC_OR_DEPARTMENT_OTHER): Payer: Self-pay | Admitting: Cardiology

## 2022-11-19 VITALS — BP 110/64 | HR 75 | Ht 73.0 in | Wt 229.9 lb

## 2022-11-19 DIAGNOSIS — I10 Essential (primary) hypertension: Secondary | ICD-10-CM | POA: Diagnosis not present

## 2022-11-19 DIAGNOSIS — I480 Paroxysmal atrial fibrillation: Secondary | ICD-10-CM

## 2022-11-19 DIAGNOSIS — Z7901 Long term (current) use of anticoagulants: Secondary | ICD-10-CM | POA: Diagnosis not present

## 2022-11-19 DIAGNOSIS — I350 Nonrheumatic aortic (valve) stenosis: Secondary | ICD-10-CM | POA: Diagnosis not present

## 2022-11-19 DIAGNOSIS — Z01812 Encounter for preprocedural laboratory examination: Secondary | ICD-10-CM

## 2022-11-19 DIAGNOSIS — D6869 Other thrombophilia: Secondary | ICD-10-CM

## 2022-11-19 DIAGNOSIS — I77810 Thoracic aortic ectasia: Secondary | ICD-10-CM | POA: Diagnosis not present

## 2022-11-19 NOTE — Progress Notes (Signed)
Cardiology Office Note:  .    Date:  11/19/2022  ID:  Steven Shepard, DOB [birth year], MRN [id number] PCP: Ardith Dark, MD  [facility] Providers Cardiologist:  Jodelle Red, MD     History of Present Illness: .    Steven Shepard is a 77 y.o. male with a hx of paroxysmal atrial fibrillation, hemolytic anemia, aortic stenosis, cold agglutinin disease, COPD, hyperlipidemia, hypertension, OSA on CPAP, and pre-diabetes, who is seen for follow-up. He was previously a patient of Dr. Delton See and Jacolyn Reedy.   Family history: His father (19 yo) and mother (67 yo) died of heart attacks. Prior cardiac testing and/or incidental findings on other testing (ie coronary calcium): Heart Cath (20-30 yrs ago), no stents Exercise level: Regularly walks his dog. Reports mild chest pain and shortness of breath due to deconditioning.   On 04/03/2021, he was doing well since his hip surgery earlier that month. Was completing home PT. Reported several very brief palpitations, subsided with taking deep breaths. No significant sensations perioperatively. Tolerating rivaroxaban.    Repeat echo completed 09/2022 revealing LVEF 60-65%, mild LVH, indeterminate diastolic parameters. Left and right atrial size mildly dilated. Trivial mitral regurgitation. Moderate to severe aortic valve stenosis. Compared to prior TTE in 2022, the mean AoV gradient is now (previously ) with Vmax 3.46m/s (previously 3.48m/s). There is mild dilatation of the aortic root, measuring 40 mm. There is mild dilatation of the ascending aorta, measuring 41 mm. He was also noted to be in Afib.     At his visit 10/2022, he complained of some shortness of breath with activities like climbing stairs, but not with walking on level ground. He also noted LE edema that improved with elevation overnight. Occasionally he felt mild palpitations but was not always able to tell that he was in Afib. At that visit he was in Afib,  subsequently underwent cardioversion 11/07/2022. He was unsuccessfully cardioverted x 3 with 150, 200, 200J of biphasic synchronized energy with anterior pressure.   Today, he is currently in atrial fibrillation. It has been 12 days since his cardioversion. Had significant pruritus for one night following the cardioversion due to adhesive sensitivity. He continues to experience shortness of breath with walking up inclines, and generalized weakness. He denies any side effects from the amiodarone. After 2 more days, he will decrease amiodarone to 200 mg daily.  He drinks decaf coffee once daily. May have a glass of tea if he goes to a restaurant.   He denies any chest pain, peripheral edema, lightheadedness, headaches, syncope, orthopnea, or PND.  ROS:  Please see the history of present illness. ROS otherwise negative except as noted.  (+) Shortness of breath (+) Generalized weakness  Studies Reviewed: Marland Kitchen    EKG Interpretation Date/Time:  Monday November 19 2022 14:06:04 EDT Ventricular Rate:  75 PR Interval:    QRS Duration:  102 QT Interval:  378 QTC Calculation: 422 R Axis:   48  Text Interpretation: Atrial fibrillation Confirmed by Jodelle Red [phone]) on 11/19/2022 2:46:56 PM    Physical Exam:    VS:  BP 110/64 (BP Location: Right Arm, Patient Position: Sitting, Cuff Size: Large)   Pulse 75   Ht 6\' 1"  (1.854 m)   Wt 229 lb 14.4 oz (104.3 kg)   BMI 30.33 kg/m    Wt Readings from Last 3 Encounters:  11/19/22 229 lb 14.4 oz (104.3 kg)  11/07/22 231 lb (104.8 kg)  10/30/22 231 lb 14.4 oz (105.2 kg)  GEN: Well nourished, well developed in no acute distress HEENT: Normal, moist mucous membranes NECK: No JVD CARDIAC: Irregular irregular rhythm, normal S1 and S2, no rubs or gallops. 2/6 late-peaking  murmur. VASCULAR: Radial and DP pulses 2+ bilaterally. No carotid bruits RESPIRATORY:  Clear to auscultation without rales, wheezing or rhonchi  ABDOMEN: Soft, non-tender,  non-distended MUSCULOSKELETAL:  Ambulates independently SKIN: Warm and dry, no edema NEUROLOGIC:  Alert and oriented x 3. No focal neuro deficits noted. PSYCHIATRIC:  Normal affect   ASSESSMENT AND PLAN: .    Paroxysmal atrial fibrillation: -CHA2DS2/VAS Stroke Risk Points=4  -tolerating rivaroxaban -attempted cardioversion, did not hold, started amiodarone. Discussed re-attempt at cardioversion, will have >5 gm load this week. He is amenable to re-attempt, scheduled today -counseled on monitoring for bleeding -continue CPAP for OSA  Informed Consent   Shared Decision Making/Informed Consent The risks (stroke, cardiac arrhythmias rarely resulting in the need for a temporary or permanent pacemaker, skin irritation or burns and complications associated with conscious sedation including aspiration, arrhythmia, respiratory failure and death), benefits (restoration of normal sinus rhythm) and alternatives of a direct current cardioversion were explained in detail to [name] and he agrees to proceed.         Moderate aortic stenosis Mild dilation of ascending aorta Elevated ASCVD risk -continue atorvastatin -last echo 09/2020, repeat 09/2022 showed progression of stenosis -reviewed symptoms that need to be reported to me   Hypertension: -at goal today -continue amlodipine 10 mg daily, carvedilol 12.5 mg daily, furosemide 40 mg daily -reports he cannot take ACEi/MRA due to hyperkalemia, but he is currently on a potassium supplement with his furosemide. Could reassess cautiously in the future if needed   Cardiac risk counseling and prevention recommendations: -recommend heart healthy/Mediterranean diet, with whole grains, fruits, vegetable, fish, lean meats, nuts, and olive oil. Limit salt. -recommend moderate walking, 3-5 times/week for 30-50 minutes each session. Aim for at least 150 minutes.week. Goal should be pace of 3 miles/hours, or walking 1.5 miles in 30 minutes -recommend  avoidance of tobacco products. Avoid excess alcohol.   Dispo: Follow-up scheduled for 12/03/2022, or sooner as needed.  I,Mathew Stumpf,acting as a Neurosurgeon for [employer], MD.,have documented all relevant documentation on the behalf of Jodelle Red, MD,as directed by  Jodelle Red, MD while in the presence of Jodelle Red, MD.  I, Jodelle Red, MD, have reviewed all documentation for this visit. The documentation on 11/19/22 for the exam, diagnosis, procedures, and orders are all accurate and complete.   Signed, Jodelle Red, MD

## 2022-11-19 NOTE — Patient Instructions (Signed)
Medication Instructions:  Your physician recommends that you continue on your current medications as directed. Please refer to the Current Medication list given to you today.  *If you need a refill on your cardiac medications before your next appointment, please call your pharmacy*   Lab Work: Your physician recommends that you return for lab work TODAY- BMP& CBC   Testing/Procedures: You are scheduled for a Cardioversion on Tuesday, September 17 with Dr. Cristal Deer.  Please arrive at the [facility] (Main Entrance A) at [facility]: [address] [place], [place] [address] at 8:00 AM (This time is 1 hour(s) before your procedure to ensure your preparation). Free valet parking service is available. You will check in at ADMITTING. The support person will be asked to wait in the waiting room.  It is OK to have someone drop you off and come back when you are ready to be discharged.     DIET:  Nothing to eat or drink after midnight except a sip of water with medications (see medication instructions below)  MEDICATION INSTRUCTIONS: Continue taking your anticoagulant (blood thinner): Rivaroxaban (Xarelto).  You will need to continue this after your procedure until you are told by your provider that it is safe to stop.    LABS: DONE AT OV 9/9  FYI:  For your safety, and to allow [place] to monitor your vital signs accurately during the surgery/procedure we request: If you have artificial nails, gel coating, SNS etc, please have those removed prior to your surgery/procedure. Not having the nail coverings /polish removed may result in cancellation or delay of your surgery/procedure.  You must have a responsible person to drive you home and stay in the waiting area during your procedure. Failure to do so could result in cancellation.  Bring your insurance cards.  *Special Note: Every effort is made to have your procedure done on time. Occasionally there are emergencies that occur at the  hospital that may cause delays. Please be patient if a delay does occur.   Follow-Up: At [facility], you and your health needs are our priority.  As part of our continuing mission to provide you with exceptional heart care, we have created designated Provider Care Teams.  These Care Teams include your primary Cardiologist (physician) and Advanced Practice Providers (APPs -  Physician Assistants and Nurse Practitioners) who all work together to provide you with the care you need, when you need it.  We recommend signing up for the patient portal called "MyChart".  Sign up information is provided on this After Visit Summary.  MyChart is used to connect with patients for Virtual Visits (Telemedicine).  Patients are able to view lab/test results, encounter notes, upcoming appointments, etc.  Non-urgent messages can be sent to your provider as well.   To learn more about what you can do with MyChart, go to [email or web address].    Your next appointment:   9/23 WITH DR. Cristal Deer AT 9:20AM

## 2022-11-19 NOTE — H&P (View-Only) (Signed)
Cardiology Office Note:  .    Date:  11/19/2022  ID:  Steven Shepard, DOB 28-Dec-1945, MRN [id number] PCP: Ardith Dark, MD  [facility] Providers Cardiologist:  Jodelle Red, MD     History of Present Illness: .    Steven Shepard is a 77 y.o. male with a hx of paroxysmal atrial fibrillation, hemolytic anemia, aortic stenosis, cold agglutinin disease, COPD, hyperlipidemia, hypertension, OSA on CPAP, and pre-diabetes, who is seen for follow-up. He was previously a patient of Dr. Delton See and Jacolyn Reedy.   Family history: His father (1 yo) and mother (76 yo) died of heart attacks. Prior cardiac testing and/or incidental findings on other testing (ie coronary calcium): Heart Cath (20-30 yrs ago), no stents Exercise level: Regularly walks his dog. Reports mild chest pain and shortness of breath due to deconditioning.   On 04/03/2021, he was doing well since his hip surgery earlier that month. Was completing home PT. Reported several very brief palpitations, subsided with taking deep breaths. No significant sensations perioperatively. Tolerating rivaroxaban.    Repeat echo completed 09/2022 revealing LVEF 60-65%, mild LVH, indeterminate diastolic parameters. Left and right atrial size mildly dilated. Trivial mitral regurgitation. Moderate to severe aortic valve stenosis. Compared to prior TTE in 2022, the mean AoV gradient is now (previously ) with Vmax 3.49m/s (previously 3.6m/s). There is mild dilatation of the aortic root, measuring 40 mm. There is mild dilatation of the ascending aorta, measuring 41 mm. He was also noted to be in Afib.     At his visit 10/2022, he complained of some shortness of breath with activities like climbing stairs, but not with walking on level ground. He also noted LE edema that improved with elevation overnight. Occasionally he felt mild palpitations but was not always able to tell that he was in Afib. At that visit he was in Afib,  subsequently underwent cardioversion 11/07/2022. He was unsuccessfully cardioverted x 3 with 150, 200, 200J of biphasic synchronized energy with anterior pressure.   Today, he is currently in atrial fibrillation. It has been 12 days since his cardioversion. Had significant pruritus for one night following the cardioversion due to adhesive sensitivity. He continues to experience shortness of breath with walking up inclines, and generalized weakness. He denies any side effects from the amiodarone. After 2 more days, he will decrease amiodarone to 200 mg daily.  He drinks decaf coffee once daily. May have a glass of tea if he goes to a restaurant.   He denies any chest pain, peripheral edema, lightheadedness, headaches, syncope, orthopnea, or PND.  ROS:  Please see the history of present illness. ROS otherwise negative except as noted.  (+) Shortness of breath (+) Generalized weakness  Studies Reviewed: Marland Kitchen    EKG Interpretation Date/Time:  Monday November 19 2022 14:06:04 EDT Ventricular Rate:  75 PR Interval:    QRS Duration:  102 QT Interval:  378 QTC Calculation: 422 R Axis:   48  Text Interpretation: Atrial fibrillation Confirmed by Jodelle Red [id number]) on 11/19/2022 2:46:56 PM    Physical Exam:    VS:  BP 110/64 (BP Location: Right Arm, Patient Position: Sitting, Cuff Size: Large)   Pulse 75   Ht 6\' 1"  (1.854 m)   Wt 229 lb 14.4 oz (104.3 kg)   BMI 30.33 kg/m    Wt Readings from Last 3 Encounters:  11/19/22 229 lb 14.4 oz (104.3 kg)  11/07/22 231 lb (104.8 kg)  10/30/22 231 lb 14.4 oz (105.2 kg)  GEN: Well nourished, well developed in no acute distress HEENT: Normal, moist mucous membranes NECK: No JVD CARDIAC: Irregular irregular rhythm, normal S1 and S2, no rubs or gallops. 2/6 late-peaking  murmur. VASCULAR: Radial and DP pulses 2+ bilaterally. No carotid bruits RESPIRATORY:  Clear to auscultation without rales, wheezing or rhonchi  ABDOMEN: Soft, non-tender,  non-distended MUSCULOSKELETAL:  Ambulates independently SKIN: Warm and dry, no edema NEUROLOGIC:  Alert and oriented x 3. No focal neuro deficits noted. PSYCHIATRIC:  Normal affect   ASSESSMENT AND PLAN: .    Paroxysmal atrial fibrillation: -CHA2DS2/VAS Stroke Risk Points=4  -tolerating rivaroxaban -attempted cardioversion, did not hold, started amiodarone. Discussed re-attempt at cardioversion, will have >5 gm load this week. He is amenable to re-attempt, scheduled today -counseled on monitoring for bleeding -continue CPAP for OSA  Informed Consent   Shared Decision Making/Informed Consent The risks (stroke, cardiac arrhythmias rarely resulting in the need for a temporary or permanent pacemaker, skin irritation or burns and complications associated with conscious sedation including aspiration, arrhythmia, respiratory failure and death), benefits (restoration of normal sinus rhythm) and alternatives of a direct current cardioversion were explained in detail to Steven Shepard and he agrees to proceed.         Moderate aortic stenosis Mild dilation of ascending aorta Elevated ASCVD risk -continue atorvastatin -last echo 09/2020, repeat 09/2022 showed progression of stenosis -reviewed symptoms that need to be reported to me   Hypertension: -at goal today -continue amlodipine 10 mg daily, carvedilol 12.5 mg daily, furosemide 40 mg daily -reports he cannot take ACEi/MRA due to hyperkalemia, but he is currently on a potassium supplement with his furosemide. Could reassess cautiously in the future if needed   Cardiac risk counseling and prevention recommendations: -recommend heart healthy/Mediterranean diet, with whole grains, fruits, vegetable, fish, lean meats, nuts, and olive oil. Limit salt. -recommend moderate walking, 3-5 times/week for 30-50 minutes each session. Aim for at least 150 minutes.week. Goal should be pace of 3 miles/hours, or walking 1.5 miles in 30 minutes -recommend  avoidance of tobacco products. Avoid excess alcohol.   Dispo: Follow-up scheduled for 12/03/2022, or sooner as needed.  I,Mathew Stumpf,acting as a Neurosurgeon for [employer], MD.,have documented all relevant documentation on the behalf of Jodelle Red, MD,as directed by  Jodelle Red, MD while in the presence of Jodelle Red, MD.  I, Jodelle Red, MD, have reviewed all documentation for this visit. The documentation on 11/19/22 for the exam, diagnosis, procedures, and orders are all accurate and complete.   Signed, Jodelle Red, MD

## 2022-11-20 LAB — BASIC METABOLIC PANEL
BUN/Creatinine Ratio: 15 (ref 10–24)
BUN: 17 mg/dL (ref 8–27)
CO2: 26 mmol/L (ref 20–29)
Calcium: 9.1 mg/dL (ref 8.6–10.2)
Chloride: 102 mmol/L (ref 96–106)
Creatinine, Ser: 1.12 mg/dL (ref 0.76–1.27)
Glucose: 90 mg/dL (ref 70–99)
Potassium: 4.2 mmol/L (ref 3.5–5.2)
Sodium: 141 mmol/L (ref 134–144)
eGFR: 69 mL/min/{1.73_m2} (ref >59)

## 2022-11-20 LAB — CBC
Hematocrit: 38.6 % (ref 37.5–51.0)
Hemoglobin: 12.9 g/dL — ABNORMAL LOW (ref 13.0–17.7)
MCH: 31.2 pg (ref 26.6–33.0)
MCHC: 33.4 g/dL (ref 31.5–35.7)
MCV: 94 fL (ref 79–97)
Platelets: 243 10*3/uL (ref 150–450)
RBC: 4.13 x10E6/uL — ABNORMAL LOW (ref 4.14–5.80)
RDW: 13.2 % (ref 11.6–15.4)
WBC: 6.5 10*3/uL (ref 3.4–10.8)

## 2022-11-26 NOTE — Progress Notes (Addendum)
Spoke to pt and instructed them to come at 0745 and to be NPO after 0000. Confirmed no missed doses of AC.   Confirmed that pt will have a ride home and someone to stay with them for 24 hours after the procedure.

## 2022-11-27 ENCOUNTER — Ambulatory Visit (HOSPITAL_COMMUNITY)
Admission: RE | Admit: 2022-11-27 | Discharge: 2022-11-27 | Disposition: A | Discharge status: Home / Self Care | Payer: Medicare Other | Source: Ambulatory Visit | Attending: Cardiology | Admitting: Cardiology

## 2022-11-27 ENCOUNTER — Ambulatory Visit (HOSPITAL_BASED_OUTPATIENT_CLINIC_OR_DEPARTMENT_OTHER): Payer: Medicare Other | Admitting: Anesthesiology

## 2022-11-27 ENCOUNTER — Ambulatory Visit (HOSPITAL_COMMUNITY): Payer: Medicare Other | Admitting: Anesthesiology

## 2022-11-27 ENCOUNTER — Encounter (HOSPITAL_COMMUNITY): Payer: Self-pay | Admitting: Cardiology

## 2022-11-27 ENCOUNTER — Encounter (HOSPITAL_COMMUNITY)
Admission: RE | Disposition: A | Discharge status: Home / Self Care | Payer: Self-pay | Source: Ambulatory Visit | Attending: Cardiology

## 2022-11-27 DIAGNOSIS — D5912 Cold autoimmune hemolytic anemia: Secondary | ICD-10-CM | POA: Insufficient documentation

## 2022-11-27 DIAGNOSIS — E785 Hyperlipidemia, unspecified: Secondary | ICD-10-CM | POA: Insufficient documentation

## 2022-11-27 DIAGNOSIS — I350 Nonrheumatic aortic (valve) stenosis: Secondary | ICD-10-CM | POA: Diagnosis not present

## 2022-11-27 DIAGNOSIS — I10 Essential (primary) hypertension: Secondary | ICD-10-CM | POA: Diagnosis not present

## 2022-11-27 DIAGNOSIS — I480 Paroxysmal atrial fibrillation: Secondary | ICD-10-CM | POA: Diagnosis not present

## 2022-11-27 DIAGNOSIS — Z8249 Family history of ischemic heart disease and other diseases of the circulatory system: Secondary | ICD-10-CM | POA: Insufficient documentation

## 2022-11-27 DIAGNOSIS — Z79899 Other long term (current) drug therapy: Secondary | ICD-10-CM | POA: Insufficient documentation

## 2022-11-27 DIAGNOSIS — G4730 Sleep apnea, unspecified: Secondary | ICD-10-CM | POA: Diagnosis not present

## 2022-11-27 DIAGNOSIS — R7303 Prediabetes: Secondary | ICD-10-CM | POA: Diagnosis not present

## 2022-11-27 DIAGNOSIS — G4733 Obstructive sleep apnea (adult) (pediatric): Secondary | ICD-10-CM | POA: Insufficient documentation

## 2022-11-27 DIAGNOSIS — I4891 Unspecified atrial fibrillation: Secondary | ICD-10-CM | POA: Diagnosis not present

## 2022-11-27 DIAGNOSIS — Z87891 Personal history of nicotine dependence: Secondary | ICD-10-CM | POA: Diagnosis not present

## 2022-11-27 DIAGNOSIS — J449 Chronic obstructive pulmonary disease, unspecified: Secondary | ICD-10-CM | POA: Diagnosis not present

## 2022-11-27 HISTORY — PX: CARDIOVERSION: SHX1299

## 2022-11-27 SURGERY — CARDIOVERSION
Anesthesia: Monitor Anesthesia Care

## 2022-11-27 MED ORDER — PROPOFOL 10 MG/ML IV BOLUS
INTRAVENOUS | Status: DC | PRN
  Administered 2022-11-27: 60 mg via INTRAVENOUS

## 2022-11-27 MED ORDER — SODIUM CHLORIDE 0.9 % IV SOLN: INTRAVENOUS | Status: DC

## 2022-11-27 MED ORDER — AMIODARONE HCL 200 MG PO TABS: 200.0000 mg | ORAL_TABLET | Freq: Every day | ORAL | 3 refills | Status: DC

## 2022-11-27 MED ORDER — LIDOCAINE 2% (20 MG/ML) 5 ML SYRINGE
INTRAMUSCULAR | Status: DC | PRN
  Administered 2022-11-27: 60 mg via INTRAVENOUS

## 2022-11-27 SURGICAL SUPPLY — 1 items: ELECT DEFIB PAD ADLT CADENCE (PAD) ×1 IMPLANT

## 2022-11-27 NOTE — Anesthesia Postprocedure Evaluation (Signed)
Anesthesia Post Note  Patient: Zyshawn Eichinger  Procedure(s) Performed: CARDIOVERSION     Patient location during evaluation: PACU Anesthesia Type: MAC Level of consciousness: awake and alert Pain management: pain level controlled Vital Signs Assessment: post-procedure vital signs reviewed and stable Respiratory status: spontaneous breathing, nonlabored ventilation, respiratory function stable and patient connected to nasal cannula oxygen Cardiovascular status: stable and blood pressure returned to baseline Postop Assessment: no apparent nausea or vomiting Anesthetic complications: no   No notable events documented.  Last Vitals:  Vitals:   11/27/22 0905 11/27/22 0915  BP: 116/70 118/75  Pulse: (!) 58 60  Resp: 12 16  Temp:    SpO2: 93% 94%    Last Pain:  Vitals:   11/27/22 0915  TempSrc:   PainSc: 0-No pain                 Mariann Barter

## 2022-11-27 NOTE — Anesthesia Procedure Notes (Signed)
Procedure Name: General with mask airway Date/Time: 11/27/2022 8:52 AM  Performed by: Nils Pyle, CRNAPre-anesthesia Checklist: Patient identified, Emergency Drugs available, Suction available, Patient being monitored and Timeout performed Patient Re-evaluated:Patient Re-evaluated prior to induction Oxygen Delivery Method: Simple face mask Preoxygenation: Pre-oxygenation with 100% oxygen Induction Type: IV induction Placement Confirmation: positive ETCO2 and breath sounds checked- equal and bilateral Dental Injury: Teeth and Oropharynx as per pre-operative assessment

## 2022-11-27 NOTE — Anesthesia Preprocedure Evaluation (Signed)
Anesthesia Evaluation  Patient identified by MRN, date of birth, ID band Patient awake    Reviewed: Allergy & Precautions, NPO status , Patient's Chart, lab work & pertinent test results, reviewed documented beta blocker date and time   History of Anesthesia Complications Negative for: history of anesthetic complications  Airway Mallampati: III  TM Distance: >3 FB Neck ROM: Full    Dental no notable dental hx.    Pulmonary sleep apnea , COPD, former smoker, neg PE   breath sounds clear to auscultation       Cardiovascular hypertension, + Peripheral Vascular Disease and + DOE  (-) CAD and (-) Past MI + dysrhythmias + Valvular Problems/Murmurs AS  Rhythm:Irregular Rate:Normal + Systolic murmurs    Neuro/Psych neg Seizures    GI/Hepatic ,neg GERD  ,,(+) Hepatitis -  Endo/Other    Renal/GU      Musculoskeletal  (+) Arthritis ,    Abdominal   Peds  Hematology  (+) Blood dyscrasia, anemia   Anesthesia Other Findings   Reproductive/Obstetrics                              Anesthesia Physical Anesthesia Plan  ASA: 3  Anesthesia Plan: MAC   Post-op Pain Management:    Induction: Intravenous  PONV Risk Score and Plan: 1  Airway Management Planned:   Additional Equipment:   Intra-op Plan:   Post-operative Plan:   Informed Consent: I have reviewed the patients History and Physical, chart, labs and discussed the procedure including the risks, benefits and alternatives for the proposed anesthesia with the patient or authorized representative who has indicated his/her understanding and acceptance.     Dental advisory given  Plan Discussed with: CRNA  Anesthesia Plan Comments:          Anesthesia Quick Evaluation

## 2022-11-27 NOTE — Transfer of Care (Signed)
Immediate Anesthesia Transfer of Care Note  Patient: Steven Shepard  Procedure(s) Performed: CARDIOVERSION  Patient Location: PACU  Anesthesia Type:General  Level of Consciousness: drowsy  Airway & Oxygen Therapy: Patient Spontanous Breathing  Post-op Assessment: Report given to RN and Post -op Vital signs reviewed and stable  Post vital signs: Reviewed and stable  Last Vitals:  Vitals Value Taken Time  BP 128/73 11/27/22 0845  Temp    Pulse 79 11/27/22 0845  Resp 9 11/27/22 0845  SpO2 94 % 11/27/22 0845  Vitals shown include unfiled device data.  Last Pain:  Vitals:   11/27/22 0751  TempSrc: Temporal         Complications: No notable events documented.

## 2022-11-27 NOTE — Discharge Instructions (Signed)
Use topical 1% hydrocortisone (over the counter) on the sticker sites to minimize itching/redness.

## 2022-11-27 NOTE — Interval H&P Note (Signed)
History and Physical Interval Note:  11/27/2022 8:40 AM  Steven Shepard  has presented today for surgery, with the diagnosis of afib.  The various methods of treatment have been discussed with the patient and family. After consideration of risks, benefits and other options for treatment, the patient has consented to  Procedure(s): CARDIOVERSION (N/A) as a surgical intervention.  The patient's history has been reviewed, patient examined, no change in status, stable for surgery.  I have reviewed the patient's chart and labs.  Questions were answered to the patient's satisfaction.     [name] Cristal Deer

## 2022-11-27 NOTE — CV Procedure (Signed)
Procedure:   DCCV  Indication:  Symptomatic atrial fibrillation  Procedure Note:  The patient signed informed consent.  They have had had therapeutic anticoagulation with rivaroxaban greater than 3 weeks.  Anesthesia was administered under the supervision of  Dr. Ace Gins.  Patient received 60 mg IV lidocaine and 60 mg IV propofol.Adequate airway was maintained throughout and vital followed per protocol.  They were cardioverted x 1 with 150J of biphasic synchronized energy.  They converted to sinus bradycardia.  There were no apparent complications.  The patient had normal neuro status and respiratory status post procedure with vitals stable as recorded elsewhere.    Follow up:  They will continue on current medical therapy and follow up with cardiology as scheduled.  Jodelle Red, MD PhD 11/27/2022 8:54 AM

## 2022-11-28 ENCOUNTER — Encounter (HOSPITAL_COMMUNITY): Payer: Self-pay | Admitting: Cardiology

## 2022-11-28 DIAGNOSIS — D2372 Other benign neoplasm of skin of left lower limb, including hip: Secondary | ICD-10-CM | POA: Diagnosis not present

## 2022-11-28 DIAGNOSIS — C44319 Basal cell carcinoma of skin of other parts of face: Secondary | ICD-10-CM | POA: Diagnosis not present

## 2022-11-28 DIAGNOSIS — D225 Melanocytic nevi of trunk: Secondary | ICD-10-CM | POA: Diagnosis not present

## 2022-11-28 DIAGNOSIS — C44629 Squamous cell carcinoma of skin of left upper limb, including shoulder: Secondary | ICD-10-CM | POA: Diagnosis not present

## 2022-11-28 DIAGNOSIS — Z85828 Personal history of other malignant neoplasm of skin: Secondary | ICD-10-CM | POA: Diagnosis not present

## 2022-11-28 DIAGNOSIS — L570 Actinic keratosis: Secondary | ICD-10-CM | POA: Diagnosis not present

## 2022-11-28 DIAGNOSIS — D2271 Melanocytic nevi of right lower limb, including hip: Secondary | ICD-10-CM | POA: Diagnosis not present

## 2022-11-28 DIAGNOSIS — L821 Other seborrheic keratosis: Secondary | ICD-10-CM | POA: Diagnosis not present

## 2022-11-28 DIAGNOSIS — D1801 Hemangioma of skin and subcutaneous tissue: Secondary | ICD-10-CM | POA: Diagnosis not present

## 2022-12-03 ENCOUNTER — Ambulatory Visit (HOSPITAL_BASED_OUTPATIENT_CLINIC_OR_DEPARTMENT_OTHER): Payer: Medicare Other | Admitting: Cardiology

## 2022-12-03 ENCOUNTER — Encounter (HOSPITAL_BASED_OUTPATIENT_CLINIC_OR_DEPARTMENT_OTHER): Payer: Self-pay | Admitting: Cardiology

## 2022-12-03 VITALS — BP 128/72 | HR 59 | Ht 73.0 in | Wt 231.0 lb

## 2022-12-03 DIAGNOSIS — I10 Essential (primary) hypertension: Secondary | ICD-10-CM | POA: Diagnosis not present

## 2022-12-03 DIAGNOSIS — I480 Paroxysmal atrial fibrillation: Secondary | ICD-10-CM | POA: Diagnosis not present

## 2022-12-03 DIAGNOSIS — Z7901 Long term (current) use of anticoagulants: Secondary | ICD-10-CM | POA: Diagnosis not present

## 2022-12-03 DIAGNOSIS — I350 Nonrheumatic aortic (valve) stenosis: Secondary | ICD-10-CM

## 2022-12-03 DIAGNOSIS — D6869 Other thrombophilia: Secondary | ICD-10-CM | POA: Diagnosis not present

## 2022-12-03 NOTE — Patient Instructions (Signed)
Medication Instructions:  Your physician recommends that you continue on your current medications as directed. Please refer to the Current Medication list given to you today.  *If you need a refill on your cardiac medications before your next appointment, please call your pharmacy*  Lab Work: NONE  Testing/Procedures: NONE  Follow-Up: 02/01/2023 9:20 AM WITH DR Cristal Deer

## 2022-12-03 NOTE — Progress Notes (Signed)
Cardiology Office Note:  .    Date:  12/03/2022  ID:  Steven Shepard, DOB 01-29-1946, MRN [id number] PCP: Ardith Dark, MD  [facility] Providers Cardiologist:  Jodelle Red, MD     History of Present Illness: .    Steven Shepard is a 77 y.o. male with a hx of paroxysmal atrial fibrillation, hemolytic anemia, aortic stenosis, cold agglutinin disease, COPD, hyperlipidemia, hypertension, OSA on CPAP, and pre-diabetes, who is seen for follow-up. He was previously a patient of Dr. Delton See and Jacolyn Reedy.   Family history: His father (74 yo) and mother (82 yo) died of heart attacks. Prior cardiac testing and/or incidental findings on other testing (ie coronary calcium): Heart Cath (20-30 yrs ago), no stents Exercise level: Regularly walks his dog. Reports mild chest pain and shortness of breath due to deconditioning.   On 04/03/2021, he was doing well since his hip surgery earlier that month. Was completing home PT. Reported several very brief palpitations, subsided with taking deep breaths. No significant sensations perioperatively. Tolerating rivaroxaban.    Repeat echo completed 09/2022 revealing LVEF 60-65%, mild LVH, indeterminate diastolic parameters. Left and right atrial size mildly dilated. Trivial mitral regurgitation. Moderate to severe aortic valve stenosis. Compared to prior TTE in 2022, the mean AoV gradient is now (previously ) with Vmax 3.40m/s (previously 3.18m/s). There is mild dilatation of the aortic root, measuring 40 mm. There is mild dilatation of the ascending aorta, measuring 41 mm. He was also noted to be in Afib.     At his visit 10/2022, he complained of some shortness of breath with activities like climbing stairs, but not with walking on level ground. He also noted LE edema that improved with elevation overnight. Occasionally he felt mild palpitations but was not always able to tell that he was in Afib. At that visit he was in Afib,  subsequently underwent cardioversion 11/07/2022. He was unsuccessfully cardioverted x 3 with 150, 200, 200J of biphasic synchronized energy with anterior pressure.    When I saw him 11/19/2022 he was 12 days post cardioversion and in Afib. Had significant pruritus for one night following the cardioversion due to adhesive sensitivity. He continued to experience shortness of breath with walking up inclines, and generalized weakness. He was scheduled for repeat cardioversion 11/27/2022 and he was successfully cardioverted x 1 and converted to sinus bradycardia.   Today, he states he is feeling a little better but still feels fatigued sometimes. When he walks he does not feel quite as short of breath. Yesterday he was able to complete a long walk of 15-20 minutes without feeling terribly. Per his EKG today he is still maintaining sinus rhythm.  When lying down at night he occasionally is able to hear his heartbeat in his ears. However, he denies any significant palpitations.   He denies any chest pain, peripheral edema, lightheadedness, headaches, syncope, orthopnea, or PND.  ROS:  Please see the history of present illness. ROS otherwise negative except as noted.  (+) Fatigue (+) Shortness of breath  Studies Reviewed: Marland Kitchen    EKG Interpretation Date/Time:  Monday December 03 2022 09:25:09 EDT Ventricular Rate:  59 PR Interval:  244 QRS Duration:  100 QT Interval:  442 QTC Calculation: 437 R Axis:   -79  Text Interpretation: Sinus bradycardia with 1st degree A-V block Left axis deviation Confirmed by Jodelle Red [phone]) on 12/03/2022 9:47:06 AM    Physical Exam:    VS:  BP 128/72   Pulse (!) 59  Ht 6\' 1"  (1.854 m)   Wt 231 lb (104.8 kg)   SpO2 96%   BMI 30.48 kg/m    Wt Readings from Last 3 Encounters:  12/03/22 231 lb (104.8 kg)  11/27/22 229 lb 15 oz (104.3 kg)  11/19/22 229 lb 14.4 oz (104.3 kg)    GEN: Well nourished, well developed in no acute distress HEENT: Normal,  moist mucous membranes NECK: No JVD CARDIAC: regular rhythm, normal S1 and S2, no rubs or gallops. 2/6 early to mid peaking systolic murmur with quiet but audible S2 VASCULAR: Radial and DP pulses 2+ bilaterally. No carotid bruits RESPIRATORY:  Clear to auscultation without rales, wheezing or rhonchi  ABDOMEN: Soft, non-tender, non-distended MUSCULOSKELETAL:  Ambulates independently SKIN: Warm and dry, no edema NEUROLOGIC:  Alert and oriented x 3. No focal neuro deficits noted. PSYCHIATRIC:  Normal affect   ASSESSMENT AND PLAN: .    Paroxysmal atrial fibrillation: -CHA2DS2/VAS Stroke Risk Points=4  -tolerating rivaroxaban -remains in SR one week post cardioversion, likely held 2/2 amiodarone (did not hold initially) -counseled on monitoring for bleeding -continue CPAP for OSA   Moderate aortic stenosis Mild dilation of ascending aorta Elevated ASCVD risk -continue atorvastatin -prior echo 09/2020, repeat 09/2022 showed progression of stenosis -reviewed symptoms that need to be reported to me   Hypertension: -at goal today -continue amlodipine 10 mg daily, carvedilol 12.5 mg daily, furosemide 40 mg daily -reports he cannot take ACEi/MRA due to hyperkalemia, but he is currently on a potassium supplement with his furosemide. Could reassess cautiously in the future if needed   Cardiac risk counseling and prevention recommendations: -recommend heart healthy/Mediterranean diet, with whole grains, fruits, vegetable, fish, lean meats, nuts, and olive oil. Limit salt. -recommend moderate walking, 3-5 times/week for 30-50 minutes each session. Aim for at least 150 minutes.week. Goal should be pace of 3 miles/hours, or walking 1.5 miles in 30 minutes -recommend avoidance of tobacco products. Avoid excess alcohol.  Dispo: Follow-up in 6-8 weeks, or sooner as needed.  I,Mathew Stumpf,acting as a Neurosurgeon for [employer], MD.,have documented all relevant documentation on the behalf of  Jodelle Red, MD,as directed by  Jodelle Red, MD while in the presence of Jodelle Red, MD.  I, Jodelle Red, MD, have reviewed all documentation for this visit. The documentation on 12/03/22 for the exam, diagnosis, procedures, and orders are all accurate and complete.   Signed, Jodelle Red, MD

## 2022-12-05 DIAGNOSIS — E119 Type 2 diabetes mellitus without complications: Secondary | ICD-10-CM | POA: Diagnosis not present

## 2022-12-05 DIAGNOSIS — H52203 Unspecified astigmatism, bilateral: Secondary | ICD-10-CM | POA: Diagnosis not present

## 2022-12-05 DIAGNOSIS — H2513 Age-related nuclear cataract, bilateral: Secondary | ICD-10-CM | POA: Diagnosis not present

## 2022-12-05 DIAGNOSIS — H5203 Hypermetropia, bilateral: Secondary | ICD-10-CM | POA: Diagnosis not present

## 2022-12-05 LAB — HM DIABETES EYE EXAM

## 2022-12-13 ENCOUNTER — Other Ambulatory Visit: Payer: Self-pay | Admitting: Family Medicine

## 2022-12-13 ENCOUNTER — Other Ambulatory Visit (HOSPITAL_BASED_OUTPATIENT_CLINIC_OR_DEPARTMENT_OTHER): Payer: Self-pay | Admitting: Cardiology

## 2022-12-13 DIAGNOSIS — I480 Paroxysmal atrial fibrillation: Secondary | ICD-10-CM

## 2022-12-13 DIAGNOSIS — I5031 Acute diastolic (congestive) heart failure: Secondary | ICD-10-CM

## 2022-12-26 ENCOUNTER — Other Ambulatory Visit (HOSPITAL_BASED_OUTPATIENT_CLINIC_OR_DEPARTMENT_OTHER): Payer: Self-pay | Admitting: Cardiology

## 2022-12-26 DIAGNOSIS — E7849 Other hyperlipidemia: Secondary | ICD-10-CM

## 2022-12-26 DIAGNOSIS — I110 Hypertensive heart disease with heart failure: Secondary | ICD-10-CM

## 2023-01-08 DIAGNOSIS — C44319 Basal cell carcinoma of skin of other parts of face: Secondary | ICD-10-CM | POA: Diagnosis not present

## 2023-01-08 DIAGNOSIS — Z85828 Personal history of other malignant neoplasm of skin: Secondary | ICD-10-CM | POA: Diagnosis not present

## 2023-01-14 ENCOUNTER — Encounter (HOSPITAL_BASED_OUTPATIENT_CLINIC_OR_DEPARTMENT_OTHER): Payer: Self-pay

## 2023-01-24 ENCOUNTER — Other Ambulatory Visit: Payer: Self-pay | Admitting: Family Medicine

## 2023-02-01 ENCOUNTER — Ambulatory Visit (INDEPENDENT_AMBULATORY_CARE_PROVIDER_SITE_OTHER): Payer: Medicare Other | Admitting: Cardiology

## 2023-02-01 ENCOUNTER — Encounter (HOSPITAL_BASED_OUTPATIENT_CLINIC_OR_DEPARTMENT_OTHER): Payer: Self-pay | Admitting: Cardiology

## 2023-02-01 VITALS — BP 124/60 | HR 62 | Ht 73.0 in | Wt 232.4 lb

## 2023-02-01 DIAGNOSIS — I480 Paroxysmal atrial fibrillation: Secondary | ICD-10-CM

## 2023-02-01 DIAGNOSIS — I10 Essential (primary) hypertension: Secondary | ICD-10-CM

## 2023-02-01 DIAGNOSIS — Z7901 Long term (current) use of anticoagulants: Secondary | ICD-10-CM | POA: Diagnosis not present

## 2023-02-01 DIAGNOSIS — I350 Nonrheumatic aortic (valve) stenosis: Secondary | ICD-10-CM | POA: Diagnosis not present

## 2023-02-01 DIAGNOSIS — D6869 Other thrombophilia: Secondary | ICD-10-CM

## 2023-02-01 DIAGNOSIS — I77810 Thoracic aortic ectasia: Secondary | ICD-10-CM

## 2023-02-01 NOTE — Patient Instructions (Signed)
Medication Instructions:  Your physician recommends that you continue on your current medications as directed. Please refer to the Current Medication list given to you today.  *If you need a refill on your cardiac medications before your next appointment, please call your pharmacy*   Follow-Up: At [facility], you and your health needs are our priority.  As part of our continuing mission to provide you with exceptional heart care, we have created designated Provider Care Teams.  These Care Teams include your primary Cardiologist (physician) and Advanced Practice Providers (APPs -  Physician Assistants and Nurse Practitioners) who all work together to provide you with the care you need, when you need it.  We recommend signing up for the patient portal called "MyChart".  Sign up information is provided on this After Visit Summary.  MyChart is used to connect with patients for Virtual Visits (Telemedicine).  Patients are able to view lab/test results, encounter notes, upcoming appointments, etc.  Non-urgent messages can be sent to your provider as well.   To learn more about what you can do with MyChart, go to [email or web address].    Your next appointment:   3 month(s)  Provider:   Jodelle Red, MD

## 2023-02-01 NOTE — Progress Notes (Signed)
Cardiology Office Note:  .    Date:  02/01/2023  ID:  Steven Shepard, DOB Jan 28, 1946, MRN [id number] PCP: Ardith Dark, MD  [facility] Providers Cardiologist:  Jodelle Red, MD     History of Present Illness: .    Steven Shepard is a 77 y.o. male with a hx of paroxysmal atrial fibrillation, hemolytic anemia, aortic stenosis, cold agglutinin disease, COPD, hyperlipidemia, hypertension, OSA on CPAP, and pre-diabetes, who is seen for follow-up. He was previously a patient of Dr. Delton See and Jacolyn Reedy.   Family history: His father (37 yo) and mother (63 yo) died of heart attacks. Prior cardiac testing and/or incidental findings on other testing (ie coronary calcium): Heart Cath (20-30 yrs ago), no stents   Repeat echo completed 09/2022 revealing LVEF 60-65%, mild LVH, indeterminate diastolic parameters. Left and right atrial size mildly dilated. Trivial mitral regurgitation. Moderate to severe aortic valve stenosis. Compared to prior TTE in 2022, the mean AoV gradient is now (previously ) with Vmax 3.39m/s (previously 3.70m/s). There is mild dilatation of the aortic root, measuring 40 mm. There is mild dilatation of the ascending aorta, measuring 41 mm. He was also noted to be in Afib.     Today: Doing much better since cardioversion. Reviewed his phone log, has not had significant afib since cardioversion. Notes shortness of breath walking uphill with his dog but otherwise no symptoms. No issues on flat ground. No chest pain, no syncope. Can hear his heartbeat at night, no other times.  ROS:  Denies chest pain, shortness of breath at rest or with normal exertion. No PND, orthopnea, LE edema or unexpected weight gain. No syncope or palpitations. ROS otherwise negative except as noted.   Studies Reviewed: Marland Kitchen         Physical Exam:    VS:  BP 124/60   Pulse 62   Ht 6\' 1"  (1.854 m)   Wt 232 lb 6.4 oz (105.4 kg)   SpO2 97%   BMI 30.66 kg/m    Wt  Readings from Last 3 Encounters:  02/01/23 232 lb 6.4 oz (105.4 kg)  12/03/22 231 lb (104.8 kg)  11/27/22 229 lb 15 oz (104.3 kg)    GEN: Well nourished, well developed in no acute distress HEENT: Normal, moist mucous membranes NECK: No JVD CARDIAC: regular rhythm, normal S1 and S2, no rubs or gallops. 3/6  mid peaking systolic murmur with quiet but audible S2 VASCULAR: Radial and DP pulses 2+ bilaterally. No carotid bruits RESPIRATORY:  Clear to auscultation without rales, wheezing or rhonchi  ABDOMEN: Soft, non-tender, non-distended MUSCULOSKELETAL:  Ambulates independently SKIN: Warm and dry, no edema NEUROLOGIC:  Alert and oriented x 3. No focal neuro deficits noted. PSYCHIATRIC:  Normal affect   ASSESSMENT AND PLAN: .    Paroxysmal atrial fibrillation: -CHA2DS2/VAS Stroke Risk Points=4  -tolerating rivaroxaban -remains in SR, likely held post cardioversion 2/2 amiodarone (did not hold initially on no antiarrhythmic) -counseled on monitoring for bleeding -doing well on amiodarone, check labs at follow up -continue CPAP for OSA   Moderate to severe aortic stenosis Mild dilation of ascending aorta Elevated ASCVD risk -continue atorvastatin -prior echo 09/2020, repeat 09/2022 showed progression of stenosis -reviewed symptoms that need to be reported to me -recheck 09/2023 or sooner for progressive symptoms   Hypertension: -at goal today -continue amlodipine 10 mg daily, carvedilol 12.5 mg daily, furosemide 40 mg daily -reports he cannot take ACEi/MRA due to hyperkalemia, but he is currently on a potassium supplement  with his furosemide. Could reassess cautiously in the future if needed   Cardiac risk counseling and prevention recommendations: -recommend heart healthy/Mediterranean diet, with whole grains, fruits, vegetable, fish, lean meats, nuts, and olive oil. Limit salt. -recommend moderate walking, 3-5 times/week for 30-50 minutes each session. Aim for at least 150  minutes.week. Goal should be pace of 3 miles/hours, or walking 1.5 miles in 30 minutes -recommend avoidance of tobacco products. Avoid excess alcohol.  Dispo: Follow-up in 3 mos, or sooner as needed.  Signed, Jodelle Red, MD, PhD, [facility]  [facility]  [facility] at [facility] [address], [address] [place], [place] [address] [phone]

## 2023-02-03 ENCOUNTER — Other Ambulatory Visit: Payer: Self-pay | Admitting: Family Medicine

## 2023-02-20 DIAGNOSIS — B078 Other viral warts: Secondary | ICD-10-CM | POA: Diagnosis not present

## 2023-02-20 DIAGNOSIS — D485 Neoplasm of uncertain behavior of skin: Secondary | ICD-10-CM | POA: Diagnosis not present

## 2023-02-20 DIAGNOSIS — D2372 Other benign neoplasm of skin of left lower limb, including hip: Secondary | ICD-10-CM | POA: Diagnosis not present

## 2023-02-20 DIAGNOSIS — D1801 Hemangioma of skin and subcutaneous tissue: Secondary | ICD-10-CM | POA: Diagnosis not present

## 2023-02-20 DIAGNOSIS — D1809 Hemangioma of other sites: Secondary | ICD-10-CM | POA: Diagnosis not present

## 2023-02-20 DIAGNOSIS — L570 Actinic keratosis: Secondary | ICD-10-CM | POA: Diagnosis not present

## 2023-02-20 DIAGNOSIS — Z85828 Personal history of other malignant neoplasm of skin: Secondary | ICD-10-CM | POA: Diagnosis not present

## 2023-02-20 DIAGNOSIS — L821 Other seborrheic keratosis: Secondary | ICD-10-CM | POA: Diagnosis not present

## 2023-02-20 DIAGNOSIS — D225 Melanocytic nevi of trunk: Secondary | ICD-10-CM | POA: Diagnosis not present

## 2023-02-20 DIAGNOSIS — B079 Viral wart, unspecified: Secondary | ICD-10-CM | POA: Diagnosis not present

## 2023-02-26 DIAGNOSIS — E7800 Pure hypercholesterolemia, unspecified: Secondary | ICD-10-CM | POA: Diagnosis not present

## 2023-02-26 DIAGNOSIS — I4811 Longstanding persistent atrial fibrillation: Secondary | ICD-10-CM | POA: Diagnosis not present

## 2023-02-26 DIAGNOSIS — Z125 Encounter for screening for malignant neoplasm of prostate: Secondary | ICD-10-CM | POA: Diagnosis not present

## 2023-02-26 DIAGNOSIS — R7303 Prediabetes: Secondary | ICD-10-CM | POA: Diagnosis not present

## 2023-02-26 DIAGNOSIS — Z23 Encounter for immunization: Secondary | ICD-10-CM | POA: Diagnosis not present

## 2023-02-26 DIAGNOSIS — I10 Essential (primary) hypertension: Secondary | ICD-10-CM | POA: Diagnosis not present

## 2023-02-26 DIAGNOSIS — Z0000 Encounter for general adult medical examination without abnormal findings: Secondary | ICD-10-CM | POA: Diagnosis not present

## 2023-02-28 ENCOUNTER — Inpatient Hospital Stay: Payer: Medicare Other | Admitting: Hematology & Oncology

## 2023-02-28 ENCOUNTER — Inpatient Hospital Stay: Payer: Medicare Other | Attending: Hematology & Oncology

## 2023-02-28 ENCOUNTER — Encounter: Payer: Self-pay | Admitting: Hematology & Oncology

## 2023-02-28 VITALS — BP 120/59 | HR 60 | Temp 98.0°F | Resp 20 | Ht 73.0 in | Wt 235.0 lb

## 2023-02-28 DIAGNOSIS — Z79899 Other long term (current) drug therapy: Secondary | ICD-10-CM | POA: Insufficient documentation

## 2023-02-28 DIAGNOSIS — D5912 Cold autoimmune hemolytic anemia: Secondary | ICD-10-CM | POA: Insufficient documentation

## 2023-02-28 DIAGNOSIS — Z7901 Long term (current) use of anticoagulants: Secondary | ICD-10-CM | POA: Insufficient documentation

## 2023-02-28 DIAGNOSIS — I4891 Unspecified atrial fibrillation: Secondary | ICD-10-CM | POA: Insufficient documentation

## 2023-02-28 LAB — CBC WITH DIFFERENTIAL (CANCER CENTER ONLY)
Abs Immature Granulocytes: 0.1 10*3/uL — ABNORMAL HIGH (ref 0.00–0.07)
Basophils Absolute: 0 10*3/uL (ref 0.0–0.1)
Basophils Relative: 0 %
Eosinophils Absolute: 0 10*3/uL (ref 0.0–0.5)
Eosinophils Relative: 2 %
HCT: 39 % (ref 39.0–52.0)
Hemoglobin: 12.5 g/dL — ABNORMAL LOW (ref 13.0–17.0)
Immature Granulocytes: 2 %
Lymphocytes Relative: 27 %
Lymphs Abs: 2 10*3/uL (ref 0.7–4.0)
MCH: 30.5 pg (ref 26.0–34.0)
MCHC: 32.1 g/dL (ref 30.0–36.0)
MCV: 95.1 fL (ref 80.0–100.0)
Monocytes Absolute: 0.5 10*3/uL (ref 0.1–1.0)
Monocytes Relative: 7 %
Neutro Abs: 4 10*3/uL (ref 1.7–7.7)
Neutrophils Relative %: 62 %
Platelet Count: 200 10*3/uL (ref 150–400)
RBC: 4.1 MIL/uL — ABNORMAL LOW (ref 4.22–5.81)
RDW: 14.2 % (ref 11.5–15.5)
WBC Count: 6.5 10*3/uL (ref 4.0–10.5)
nRBC: 0 % (ref 0.0–0.2)

## 2023-02-28 LAB — CMP (CANCER CENTER ONLY)
ALT: 18 U/L (ref 0–44)
AST: 17 U/L (ref 15–41)
Albumin: 4.3 g/dL (ref 3.5–5.0)
Alkaline Phosphatase: 65 U/L (ref 38–126)
Anion gap: 7 (ref 5–15)
BUN: 19 mg/dL (ref 8–23)
CO2: 31 mmol/L (ref 22–32)
Calcium: 9.2 mg/dL (ref 8.9–10.3)
Chloride: 105 mmol/L (ref 98–111)
Creatinine: 0.96 mg/dL (ref 0.61–1.24)
GFR, Estimated: >60 mL/min (ref >60)
Glucose, Bld: 150 mg/dL — ABNORMAL HIGH (ref 70–99)
Potassium: 4 mmol/L (ref 3.5–5.1)
Sodium: 143 mmol/L (ref 135–145)
Total Bilirubin: 2.2 mg/dL — ABNORMAL HIGH (ref <1.2)
Total Protein: 7 g/dL (ref 6.5–8.1)

## 2023-02-28 LAB — RETICULOCYTES
Immature Retic Fract: 16.5 % — ABNORMAL HIGH (ref 2.3–15.9)
RBC.: 3.95 MIL/uL — ABNORMAL LOW (ref 4.22–5.81)
Retic Count, Absolute: 105 10*3/uL (ref 19.0–186.0)
Retic Ct Pct: 2.7 % (ref 0.4–3.1)

## 2023-02-28 LAB — LACTATE DEHYDROGENASE: LDH: 146 U/L (ref 98–192)

## 2023-02-28 NOTE — Progress Notes (Signed)
Hematology and Oncology Follow Up Visit  Steven Shepard [id number] 1945-11-05 77 y.o. 02/28/2023   Principle Diagnosis:  Cold agglutinin disease  Current Therapy:   Folic acid 2 mg p.o. daily      Interim History:  Steven Shepard is  back for another visit.  We see him every 6 months.  He is doing quite well.  He has had a nice time since we last saw him.  Had a very nice Thanksgiving.  He is going have very nice Christmas.   Unfortunately, we do not have back the CBC yet.  The big news is that if he was cardioverted since we last saw him.  He had atrial fibrillation.  He was able to be cardioverted with amiodarone.  He currently is on amiodarone.  When we last saw him, his cold agglutinin was 1: 1024.  He is taking his folic acid.  He has had no pain in the hands or feet.  He has had no weakness.  He has had no bleeding.  He has had no dysuria..  There is been no issues with rashes.  He has had no nausea or vomiting.  Overall, I would say that his performance status is ECOG 1.      Medications:  Current Outpatient Medications:    acetaminophen (TYLENOL) 650 MG CR tablet, Take 650 mg by mouth 2 (two) times daily as needed for pain., Disp: , Rfl:    amiodarone (PACERONE) 200 MG tablet, Take 1 tablet (200 mg total) by mouth daily., Disp: 90 tablet, Rfl: 3   amLODipine (NORVASC) 10 MG tablet, TAKE 1 TABLET(10 MG) BY MOUTH DAILY, Disp: 90 tablet, Rfl: 1   atorvastatin (LIPITOR) 20 MG tablet, TAKE 1 TABLET(20 MG) BY MOUTH DAILY, Disp: 90 tablet, Rfl: 3   carvedilol (COREG) 12.5 MG tablet, TAKE 1 TABLET(12.5 MG TOTAL) BY MOUTH TWICE DAILY WITH A MEAL., Disp: 180 tablet, Rfl: 3   fexofenadine (ALLEGRA) 180 MG tablet, Take 180 mg by mouth in the morning., Disp: , Rfl:    folic acid (FOLVITE) 800 MCG tablet, Take 800 mcg by mouth 2 (two) times daily., Disp: , Rfl:    furosemide (LASIX) 40 MG tablet, TAKE 1 TABLET BY MOUTH TWICE DAILY AT 8 AM AND AT 2 PM, Disp: 180 tablet, Rfl: 0    Krill Oil 500 MG CAPS, Take 500 mg by mouth daily with supper., Disp: , Rfl:    metFORMIN (GLUCOPHAGE) 500 MG tablet, TAKE 1 TABLET(500 MG) BY MOUTH DAILY WITH BREAKFAST, Disp: 90 tablet, Rfl: 3   Misc Natural Product Nasal (NASAL CLEANSE RINSE MIX NA), Place into the nose in the morning. NeilMed Sinus Rinse, Disp: , Rfl:    Multiple Vitamins-Minerals (ONE-A-DAY MENS 50+) TABS, Take 1 tablet by mouth daily with breakfast., Disp: , Rfl:    potassium chloride SA (KLOR-CON M) 20 MEQ tablet, TAKE 1 TABLET(20 MEQ) BY MOUTH AT 8 AM AND AT 2 PM AS DIRECTED, Disp: 180 tablet, Rfl: 3   PRESCRIPTION MEDICATION, Pt has CPAP Machine, Disp: , Rfl:    Psyllium (METAMUCIL FIBER PO), Take by mouth See admin instructions. Mix 1 well-rounded teaspoonful of powder into 4-8 ounces of water with one Emergen-C packet and drink every morning, Disp: , Rfl:    tamsulosin (FLOMAX) 0.4 MG CAPS capsule, TAKE 1 CAPSULE(0.4 MG) BY MOUTH DAILY, Disp: 90 capsule, Rfl: 1   XARELTO 20 MG TABS tablet, TAKE 1 TABLET(20 MG) BY MOUTH DAILY WITH SUPPER, Disp: 90 tablet, Rfl: 1  Allergies:  Allergies  Allergen Reactions   Ace Inhibitors Other (See Comments)    High K+    Codeine Other (See Comments)    Hallucinations   Spironolactone Other (See Comments)    Increases potassium    Past Medical History, Surgical history, Social history, and Family History were reviewed and updated.  Review of Systems: Review of Systems  Constitutional: Negative.   HENT:  Negative.    Eyes: Negative.   Respiratory: Negative.    Cardiovascular: Negative.   Gastrointestinal: Negative.   Endocrine: Negative.   Genitourinary: Negative.    Musculoskeletal: Negative.   Skin: Negative.   Neurological: Negative.   Hematological: Negative.   Psychiatric/Behavioral: Negative.      Physical Exam:  height is 6\' 1"  (1.854 m) and weight is 235 lb (106.6 kg). His oral temperature is 98 F (36.7 C). His blood pressure is 120/59 (abnormal) and his  pulse is 60. His respiration is 20 and oxygen saturation is 97%.   Wt Readings from Last 3 Encounters:  02/28/23 235 lb (106.6 kg)  02/01/23 232 lb 6.4 oz (105.4 kg)  12/03/22 231 lb (104.8 kg)    Physical Exam Vitals reviewed.  HENT:     Head: Normocephalic and atraumatic.  Eyes:     Pupils: Pupils are equal, round, and reactive to light.  Cardiovascular:     Rate and Rhythm: Normal rate and regular rhythm.     Heart sounds: Normal heart sounds.  Pulmonary:     Effort: Pulmonary effort is normal.     Breath sounds: Normal breath sounds.  Abdominal:     General: Bowel sounds are normal.     Palpations: Abdomen is soft.  Musculoskeletal:        General: No tenderness or deformity. Normal range of motion.     Cervical back: Normal range of motion.  Lymphadenopathy:     Cervical: No cervical adenopathy.  Skin:    General: Skin is warm and dry.     Findings: No erythema or rash.  Neurological:     Mental Status: He is alert and oriented to person, place, and time.  Psychiatric:        Behavior: Behavior normal.        Thought Content: Thought content normal.        Judgment: Judgment normal.      Lab Results  Component Value Date   WBC 6.5 11/19/2022   HGB 12.9 (L) 11/19/2022   HCT 38.6 11/19/2022   MCV 94 11/19/2022   PLT 243 11/19/2022     Chemistry      Component Value Date/Time   NA 143 02/28/2023 0951   NA 141 11/19/2022 1514   K 4.0 02/28/2023 0951   CL 105 02/28/2023 0951   CO2 31 02/28/2023 0951   BUN 19 02/28/2023 0951   BUN 17 11/19/2022 1514   CREATININE 0.96 02/28/2023 0951   CREATININE 0.89 01/13/2020 0906      Component Value Date/Time   CALCIUM 9.2 02/28/2023 0951   ALKPHOS 65 02/28/2023 0951   AST 17 02/28/2023 0951   ALT 18 02/28/2023 0951   BILITOT 2.2 (H) 02/28/2023 0951      Impression and Plan: Steven Shepard is a very nice 77 year old white male.  He has cold agglutinin disease.  As far as I can tell, this is idiopathic.  There  is no associated lymphoproliferative disease or autoimmune disease that we have been able to find..  It is no surprise  that his hemoglobin is so good right now.  His bilirubin is good indicator for him.  His bilirubin is not any higher than it was back in June.  I think that as long as he takes his folic acid, should be okay.  Is very proactive when he comes to make sure he stays warm when it is cold outside. .  We will plan to get him back in 6 more months, as always,    Josph Macho, MD 12/19/202410:42 AM

## 2023-03-04 LAB — COLD AGGLUTININ TITER: Cold Agglutinin Titer: 1:256 {titer} — ABNORMAL HIGH

## 2023-03-22 ENCOUNTER — Other Ambulatory Visit: Payer: Self-pay | Admitting: Family Medicine

## 2023-03-22 DIAGNOSIS — I480 Paroxysmal atrial fibrillation: Secondary | ICD-10-CM

## 2023-03-22 DIAGNOSIS — I5031 Acute diastolic (congestive) heart failure: Secondary | ICD-10-CM

## 2023-03-26 ENCOUNTER — Other Ambulatory Visit: Payer: Self-pay | Admitting: Family Medicine

## 2023-04-16 ENCOUNTER — Other Ambulatory Visit: Payer: Self-pay | Admitting: Cardiology

## 2023-04-16 DIAGNOSIS — I480 Paroxysmal atrial fibrillation: Secondary | ICD-10-CM

## 2023-04-16 NOTE — Telephone Encounter (Signed)
Prescription refill request for Xarelto received.  Indication:afib Last office visit:11/24 Weight:106.6  kg Age:78 Scr:0.96  12/24 CrCl:98.7  ml/min  Prescription refilled

## 2023-04-23 ENCOUNTER — Ambulatory Visit (INDEPENDENT_AMBULATORY_CARE_PROVIDER_SITE_OTHER): Payer: Medicare Other

## 2023-04-23 VITALS — Wt 225.0 lb

## 2023-04-23 DIAGNOSIS — Z0000 Encounter for general adult medical examination without abnormal findings: Secondary | ICD-10-CM

## 2023-04-23 NOTE — Progress Notes (Signed)
Subjective:   Steven Shepard is a 78 y.o. male who presents for Medicare Annual/Subsequent preventive examination.  Visit Complete: Virtual I connected with  Steven Shepard on 04/23/23 by a video and audio enabled telemedicine application and verified that I am speaking with the correct person using two identifiers.  Patient Location: Home  Provider Location: Office/Clinic  I discussed the limitations of evaluation and management by telemedicine. The patient expressed understanding and agreed to proceed.  Vital Signs: Because this visit was a virtual/telehealth visit, some criteria may be missing or patient reported. Any vitals not documented were not able to be obtained and vitals that have been documented are patient reported.  Patient Medicare AWV questionnaire was completed by the patient on 04/22/23; I have confirmed that all information answered by patient is correct and no changes since this date.  Cardiac Risk Factors include: advanced age (>46men, >79 women);dyslipidemia;hypertension;male gender     Objective:    Today's Vitals   04/23/23 1044  Weight: 225 lb (102.1 kg)   Body mass index is 29.69 kg/m.     04/23/2023   10:47 AM 02/28/2023   10:19 AM 11/07/2022    7:10 AM 09/06/2022   10:33 AM 02/28/2022   10:27 AM 02/27/2022    8:08 AM 03/15/2021    5:27 PM  Advanced Directives  Does Patient Have a Medical Advance Directive? Yes Yes Yes Yes Yes Yes Yes  Type of Estate agent of [place];Living will Healthcare Power of [place];Living will  Healthcare Power of [place];Living will Healthcare Power of [place];Living will Healthcare Power of [place];Living will Living will;Healthcare Power of Attorney  Does patient want to make changes to medical advance directive? No - Patient declined     No - Patient declined No - Patient declined  Copy of Healthcare Power of Attorney in Chart? Yes - validated most recent copy scanned in chart (See row  information) Yes - validated most recent copy scanned in chart (See row information)  Yes - validated most recent copy scanned in chart (See row information) Yes - validated most recent copy scanned in chart (See row information) Yes - validated most recent copy scanned in chart (See row information) No - copy requested    Current Medications (verified) Outpatient Encounter Medications as of 04/23/2023  Medication Sig   acetaminophen (TYLENOL) 650 MG CR tablet Take 650 mg by mouth 2 (two) times daily as needed for pain.   amiodarone (PACERONE) 200 MG tablet Take 1 tablet (200 mg total) by mouth daily.   amLODipine (NORVASC) 10 MG tablet TAKE 1 TABLET(10 MG) BY MOUTH DAILY   AMOXICILLIN PO Take by mouth. Before dental work only   atorvastatin (LIPITOR) 20 MG tablet TAKE 1 TABLET(20 MG) BY MOUTH DAILY   carvedilol (COREG) 12.5 MG tablet TAKE 1 TABLET(12.5 MG TOTAL) BY MOUTH TWICE DAILY WITH A MEAL.   fexofenadine (ALLEGRA) 180 MG tablet Take 180 mg by mouth in the morning.   folic acid (FOLVITE) 800 MCG tablet Take 800 mcg by mouth 2 (two) times daily.   furosemide (LASIX) 40 MG tablet TAKE 1 TABLET BY MOUTH TWICE DAILY AT 8 AM AND AT 2 PM   Krill Oil 500 MG CAPS Take 500 mg by mouth daily with supper.   metFORMIN (GLUCOPHAGE) 500 MG tablet TAKE 1 TABLET(500 MG) BY MOUTH DAILY WITH BREAKFAST   Misc Natural Product Nasal (NASAL CLEANSE RINSE MIX NA) Place into the nose in the morning. NeilMed Sinus Rinse   Multiple Vitamins-Minerals (  ONE-A-DAY MENS 50+) TABS Take 1 tablet by mouth daily with breakfast.   potassium chloride SA (KLOR-CON M) 20 MEQ tablet TAKE 1 TABLET(20 MEQ) BY MOUTH AT 8 AM AND AT 2 PM AS DIRECTED   PRESCRIPTION MEDICATION Pt has CPAP Machine   Psyllium (METAMUCIL FIBER PO) Take by mouth See admin instructions. Mix 1 well-rounded teaspoonful of powder into 4-8 ounces of water with one Emergen-C packet and drink every morning   tamsulosin (FLOMAX) 0.4 MG CAPS capsule TAKE 1  CAPSULE(0.4 MG) BY MOUTH DAILY   XARELTO 20 MG TABS tablet TAKE 1 TABLET(20 MG) BY MOUTH DAILY WITH SUPPER.   No facility-administered encounter medications on file as of 04/23/2023.    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   COPD (chronic obstructive pulmonary disease) (HCC)    DOE (dyspnea on exertion)    Dysrhythmia    Elevated bilirubin    Fatty liver    Heart murmur    History of neck surgery    Hyperlipidemia    Hypertension    Mild dilation of ascending aorta (HCC)    OSA on CPAP    PAF (paroxysmal atrial fibrillation) (HCC)    Pre-diabetes    Umbilical hernia    Past Surgical History:  Procedure Laterality Date   ADENOIDECTOMY     CARDIOVERSION N/A 11/07/2022   Procedure: CARDIOVERSION;  Surgeon: Jodelle Red, MD;  Location: [facility] INVASIVE CV LAB;  Service: Cardiovascular;  Laterality: N/A;   CARDIOVERSION N/A 11/27/2022   Procedure: CARDIOVERSION;  Surgeon: Jodelle Red, MD;  Location: [facility] INVASIVE CV LAB;  Service: Cardiovascular;  Laterality: N/A;   HIATAL HERNIA REPAIR  2011   NECK SURGERY  2005   SKIN SURGERY     skin cancer   TONSILLECTOMY     TOTAL HIP ARTHROPLASTY Right 09/18/2010   Dr. Despina Hick   TOTAL HIP ARTHROPLASTY Left 03/15/2021   Procedure: TOTAL HIP ARTHROPLASTY ANTERIOR APPROACH;  Surgeon: Ollen Gross, MD;  Location: [facility] ORS;  Service: Orthopedics;  Laterality: Left;   Family History  Problem Relation Age of Onset   Coronary artery disease Mother    Hypertension Mother    Heart attack Mother    Coronary artery disease Father    Hypertension Father    Heart attack Father    Diabetes Sister    Hypertension Sister    Diabetes Brother    Cancer Brother    Colon cancer Neg Hx     Colonic polyp Neg Hx    Social History   Socioeconomic History   Marital status: Married    Spouse name: Not on file   Number of children: Not on file   Years of education: Not on file   Highest education level: Bachelor's degree (e.g., BA, AB, BS)  Occupational History   Occupation: Engineer, drilling: [employer]    Comment: retired  Tobacco Use   Smoking status: Former    Current packs/day: 0.00    Average packs/day: 1.5 packs/day for 3.0 years (4.5 ttl pk-yrs)    Types: Cigarettes, Pipe    Start date: 03/12/1970    Quit date: 03/12/1973    Years since quitting: 50.1   Smokeless tobacco: Never   Tobacco comments:  quit appox 36 years ago  Vaping Use   Vaping status: Never Used  Substance and Sexual Activity   Alcohol use: Yes    Comment: occasionally, 1-7 drinks per week   Drug use: No   Sexual activity: Yes  Other Topics Concern   Not on file  Social History Narrative   Not on file   Social Drivers of Health   Financial Resource Strain: Low Risk  (04/22/2023)   Overall Financial Resource Strain (CARDIA)    Difficulty of Paying Living Expenses: Not hard at all  Food Insecurity: No Food Insecurity (04/22/2023)   Hunger Vital Sign    Worried About Running Out of Food in the Last Year: Never true    Ran Out of Food in the Last Year: Never true  Transportation Needs: No Transportation Needs (04/22/2023)   PRAPARE - Administrator, Civil Service (Medical): No    Lack of Transportation (Non-Medical): No  Physical Activity: Unknown (04/22/2023)   Exercise Vital Sign    Days of Exercise per Week: Patient declined    Minutes of Exercise per Session: 10 min  Recent Concern: Physical Activity - Insufficiently Active (02/26/2023)   Received from [facility]   Exercise Vital Sign    Days of Exercise per Week: 7 days    Minutes of Exercise per Session: 20 min  Stress: No Stress Concern Present (04/22/2023)   [employer] of Occupational Health -  Occupational Stress Questionnaire    Feeling of Stress : Not at all  Social Connections: Socially Integrated (04/22/2023)   Social Connection and Isolation Panel [NHANES]    Frequency of Communication with Friends and Family: More than three times a week    Frequency of Social Gatherings with Friends and Family: Twice a week    Attends Religious Services: More than 4 times per year    Active Member of [employer] or Organizations: Yes    Attends Engineer, structural: More than 4 times per year    Marital Status: Married    Tobacco Counseling Counseling given: Not Answered Tobacco comments: quit appox 36 years ago   Clinical Intake:  Pre-visit preparation completed: Yes  Pain : No/denies pain     BMI - recorded: 29.69 Nutritional Status: BMI 25 -29 Overweight Nutritional Risks: None Diabetes: No  How often do you need to have someone help you when you read instructions, pamphlets, or other written materials from your doctor or pharmacy?: 1 - Never  Interpreter Needed?: No  Information entered by :: Lanier Ensign, LPN   Activities of Daily Living    04/22/2023   12:44 PM 11/27/2022    8:08 AM  In your present state of health, do you have any difficulty performing the following activities:  Hearing? 0 0  Vision? 0 0  Difficulty concentrating or making decisions? 0 0  Walking or climbing stairs? 0 0  Dressing or bathing? 0 0  Doing errands, shopping? 0   Preparing Food and eating ? N   Using the Toilet? N   In the past six months, have you accidently leaked urine? N   Do you have problems with loss of bowel control? N   Managing your Medications? N   Managing your Finances? N   Housekeeping or managing your Housekeeping? N     Patient Care Team: Ardith Dark, MD as PCP - General (Family Medicine) Jodelle Red, MD as PCP - Cardiology (Cardiology) Elise Benne, MD as Consulting Physician (Ophthalmology) Tobias Alexander  Rexene Edison, MD as Consulting  Physician (Cardiology) Shirlean Kelly, MD as Consulting Physician (Neurosurgery) Carman Ching, MD (Inactive) as Consulting Physician (Gastroenterology) [place], Amy, MD as Consulting Physician (Dermatology) Oretha Milch, MD as Consulting Physician (Pulmonary Disease) Josph Macho, MD as Consulting Physician (Oncology)  Indicate any recent Medical Services you may have received from other than [facility] providers in the past year (date may be approximate).     Assessment:   This is a routine wellness examination for [facility].  Hearing/Vision screen Hearing Screening - Comments:: Pt  denies any hearing issues  Vision Screening - Comments:: Pt follows up with Dr Emily Filbert for annual eye exams    Goals Addressed             This Visit's Progress    Patient Stated       Lose weight        Depression Screen    04/23/2023   10:48 AM 07/31/2022    8:36 AM 04/24/2022   12:28 PM 02/27/2022    8:07 AM 02/05/2022    7:29 AM 02/21/2021    7:57 AM 11/01/2020    3:31 PM  PHQ 2/9 Scores  PHQ - 2 Score 0 0 0 0 0 0 0    Fall Risk    04/22/2023   12:44 PM 07/31/2022    8:36 AM 02/27/2022    8:09 AM 02/23/2022    6:17 PM 02/05/2022    7:29 AM  Fall Risk   Falls in the past year? 0 0 0 0 0  Number falls in past yr: 0 0 0 0 0  Injury with Fall? 0 0 0 0 0  Risk for fall due to : No Fall Risks No Fall Risks Impaired vision  No Fall Risks  Follow up Falls prevention discussed  Falls prevention discussed      MEDICARE RISK AT HOME: Medicare Risk at Home Any stairs in or around the home?: (Patient-Rptd) Yes If so, are there any without handrails?: (Patient-Rptd) No Home free of loose throw rugs in walkways, pet beds, electrical cords, etc?: (Patient-Rptd) Yes Adequate lighting in your home to reduce risk of falls?: (Patient-Rptd) Yes Life alert?: (Patient-Rptd) No Use of a cane, walker or w/c?: (Patient-Rptd) No Grab bars in the bathroom?: (Patient-Rptd) No Shower chair or bench in  shower?: (Patient-Rptd) No Elevated toilet seat or a handicapped toilet?: (Patient-Rptd) Yes  TIMED UP AND GO:  Was the test performed?  No    Cognitive Function:        04/23/2023   10:49 AM 02/27/2022    8:09 AM 02/21/2021    8:02 AM 02/18/2020    9:32 AM 01/14/2019    2:11 PM  6CIT Screen  What Year? 0 points 0 points 0 points 0 points 0 points  What month? 0 points 0 points 0 points 0 points 0 points  What time? 0 points 0 points 0 points  0 points  Count back from 20 0 points 0 points 0 points 0 points 0 points  Months in reverse 0 points 0 points 0 points 0 points 0 points  Repeat phrase 0 points 0 points 0 points 0 points 0 points  Total Score 0 points 0 points 0 points  0 points    Immunizations Immunization History  Administered Date(s) Administered   Fluad Quad(high Dose 65+) 12/17/2018, 01/12/2020, 01/12/2021, 02/05/2022   Fluad Trivalent(High Dose 65+) 02/26/2023   Influenza Split 12/11/2011, 01/09/2012, 12/10/2012, 01/11/2014   Influenza, High Dose Seasonal PF  01/09/2012, 01/09/2013, 01/11/2014, 12/14/2015, 01/01/2018   Influenza, Seasonal, Injecte, Preservative Fre 01/09/2013   Influenza-Unspecified 01/09/2012, 01/09/2013, 12/10/2013, 01/11/2014, 12/14/2015, 11/25/2016   PFIZER(Purple Top)SARS-COV-2 Vaccination 03/31/2019, 04/21/2019, 12/18/2019   PNEUMOCOCCAL CONJUGATE-20 05/30/2021   Pneumococcal Conjugate-13 11/15/2015   Pneumococcal Polysaccharide-23 09/09/2009, 06/07/2010, 01/11/2018   Tdap 11/20/2005, 01/11/2018   Zoster Recombinant(Shingrix) 01/18/2021, 05/30/2021   Zoster, Live 03/12/2010    TDAP status: Up to date  Flu Vaccine status: Up to date  Pneumococcal vaccine status: Up to date  Covid-19 vaccine status: Completed vaccines  Qualifies for Shingles Vaccine? Yes   Zostavax completed Yes   Shingrix Completed?: Yes  Screening Tests Health Maintenance  Topic Date Due   Medicare Annual Wellness (AWV)  04/22/2024   DTaP/Tdap/Td (3 - Td  or Tdap) 01/12/2028   Pneumonia Vaccine 53+ Years old  Completed   INFLUENZA VACCINE  Completed   Hepatitis C Screening  Completed   Zoster Vaccines- Shingrix  Completed   HPV VACCINES  Aged Out   Colonoscopy  Discontinued   COVID-19 Vaccine  Discontinued   Fecal DNA (Cologuard)  Discontinued    Health Maintenance  There are no preventive care reminders to display for this patient.  Colorectal cancer screening: No longer required.    Additional Screening:  Hepatitis C Screening:  Completed 03/01/17  Vision Screening: Recommended annual ophthalmology exams for early detection of glaucoma and other disorders of the eye. Is the patient up to date with their annual eye exam?  Yes  Who is the provider or what is the name of the office in which the patient attends annual eye exams? Dr Emily Filbert  If pt is not established with a provider, would they like to be referred to a provider to establish care? No .   Dental Screening: Recommended annual dental exams for proper oral hygiene  Community Resource Referral / Chronic Care Management: CRR required this visit?  No   CCM required this visit?  No     Plan:     I have personally reviewed and noted the following in the patient's chart:   Medical and social history Use of alcohol, tobacco or illicit drugs  Current medications and supplements including opioid prescriptions. Patient is not currently taking opioid prescriptions. Functional ability and status Nutritional status Physical activity Advanced directives List of other physicians Hospitalizations, surgeries, and ER visits in previous 12 months Vitals Screenings to include cognitive, depression, and falls Referrals and appointments  In addition, I have reviewed and discussed with patient certain preventive protocols, quality metrics, and best practice recommendations. A written personalized care plan for preventive services as well as general preventive health  recommendations were provided to patient.     Marzella Schlein, LPN   04/25/863   After Visit Summary: (MyChart) Due to this being a telephonic visit, the after visit summary with patients personalized plan was offered to patient via MyChart   Nurse Notes: none

## 2023-04-23 NOTE — Patient Instructions (Signed)
Steven Shepard , Thank you for taking time to come for your Medicare Wellness Visit. I appreciate your ongoing commitment to your health goals. Please review the following plan we discussed and let me know if I can assist you in the future.   Referrals/Orders/Follow-Ups/Clinician Recommendations: continue to work on losing weight  Aim for 30 minutes of exercise or brisk walking, 6-8 glasses of water, and 5 servings of fruits and vegetables each day.   This is a list of the screening recommended for you and due dates:  Health Maintenance  Topic Date Due   Medicare Annual Wellness Visit  04/22/2024   DTaP/Tdap/Td vaccine (3 - Td or Tdap) 01/12/2028   Pneumonia Vaccine  Completed   Flu Shot  Completed   Hepatitis C Screening  Completed   Zoster (Shingles) Vaccine  Completed   HPV Vaccine  Aged Out   Colon Cancer Screening  Discontinued   COVID-19 Vaccine  Discontinued   Cologuard (Stool DNA test)  Discontinued    Advanced directives: (In Chart) A copy of your advanced directives are scanned into your chart should your provider ever need it.  Next Medicare Annual Wellness Visit scheduled for next year: Yes

## 2023-05-08 ENCOUNTER — Ambulatory Visit (INDEPENDENT_AMBULATORY_CARE_PROVIDER_SITE_OTHER): Payer: Medicare Other | Admitting: Cardiology

## 2023-05-08 ENCOUNTER — Encounter (HOSPITAL_BASED_OUTPATIENT_CLINIC_OR_DEPARTMENT_OTHER): Payer: Self-pay | Admitting: Cardiology

## 2023-05-08 VITALS — BP 114/62 | HR 59 | Ht 73.0 in | Wt 234.3 lb

## 2023-05-08 DIAGNOSIS — Z79899 Other long term (current) drug therapy: Secondary | ICD-10-CM | POA: Diagnosis not present

## 2023-05-08 DIAGNOSIS — Z7901 Long term (current) use of anticoagulants: Secondary | ICD-10-CM | POA: Diagnosis not present

## 2023-05-08 DIAGNOSIS — I77810 Thoracic aortic ectasia: Secondary | ICD-10-CM

## 2023-05-08 DIAGNOSIS — I10 Essential (primary) hypertension: Secondary | ICD-10-CM | POA: Diagnosis not present

## 2023-05-08 DIAGNOSIS — I480 Paroxysmal atrial fibrillation: Secondary | ICD-10-CM

## 2023-05-08 DIAGNOSIS — I350 Nonrheumatic aortic (valve) stenosis: Secondary | ICD-10-CM

## 2023-05-08 DIAGNOSIS — D6869 Other thrombophilia: Secondary | ICD-10-CM

## 2023-05-08 NOTE — Progress Notes (Signed)
 Cardiology Office Note:  .    Date:  05/08/2023  ID:  Steven Shepard, DOB October 18, 1945, MRN [id number] PCP: Ardith Dark, MD  [facility] Providers Cardiologist:  Jodelle Red, MD     History of Present Illness: .    Steven Shepard is a 78 y.o. male with a hx of paroxysmal atrial fibrillation, hemolytic anemia, aortic stenosis, cold agglutinin disease, COPD, hyperlipidemia, hypertension, OSA on CPAP, and pre-diabetes, who is seen for follow-up. He was previously a patient of Dr. Delton See and Jacolyn Reedy.   Family history: His father (66 yo) and mother (53 yo) died of heart attacks. Prior cardiac testing and/or incidental findings on other testing (ie coronary calcium): Heart Cath (20-30 yrs ago), no stents   Repeat echo completed 09/2022 revealing LVEF 60-65%, mild LVH, indeterminate diastolic parameters. Left and right atrial size mildly dilated. Trivial mitral regurgitation. Moderate to severe aortic valve stenosis. Compared to prior TTE in 2022, the mean AoV gradient is now (previously ) with Vmax 3.73m/s (previously 3.73m/s). There is mild dilatation of the aortic root, measuring 40 mm. There is mild dilatation of the ascending aorta, measuring 41 mm. He was also noted to be in Afib.     Today: Doing very well. His watch tells him he has been in afib <2% of the time (the lowest reading, never reads 0). Has not felt any afib. Energy level is still not where he wants it to be, and he gets some shortness of breath with activity. Shortness of breath occurs for instance walking up a hill, but it does not stop him.   Has chronic mild LE edema, unchanged. Wears compression socks, which help. Worse at the end of the day, better in the AM, mild.  Chronic intermittent pulsatile tinnitus, mild, unchanged.   Reviewed his aortic valve stenosis, symptoms to watch for. He has not had   ROS:  Denies chest pain, shortness of breath. No PND, orthopnea, or unexpected  weight gain. No syncope or palpitations. ROS otherwise negative except as noted.   Studies Reviewed: Marland Kitchen         Physical Exam:    VS:  BP 114/62 (BP Location: Left Arm, Patient Position: Sitting, Cuff Size: Normal)   Pulse (!) 59   Ht 6\' 1"  (1.854 m)   Wt 234 lb 4.8 oz (106.3 kg)   SpO2 96%   BMI 30.91 kg/m    Wt Readings from Last 3 Encounters:  05/08/23 234 lb 4.8 oz (106.3 kg)  04/23/23 225 lb (102.1 kg)  02/28/23 235 lb (106.6 kg)    GEN: Well nourished, well developed in no acute distress HEENT: Normal, moist mucous membranes NECK: No JVD CARDIAC: regular rhythm, normal S1 and S2, no rubs or gallops. 3/6 mid peaking systolic murmur with quiet but audible S2. VASCULAR: Radial and DP pulses 2+ bilaterally. No carotid bruits RESPIRATORY:  Clear to auscultation without rales, wheezing or rhonchi  ABDOMEN: Soft, non-tender, non-distended MUSCULOSKELETAL:  Ambulates independently SKIN: Warm and dry, no edema NEUROLOGIC:  Alert and oriented x 3. No focal neuro deficits noted. PSYCHIATRIC:  Normal affect    ASSESSMENT AND PLAN: .    Paroxysmal atrial fibrillation: -CHA2DS2/VAS Stroke Risk Points=4  -tolerating rivaroxaban -remains in SR, likely held post cardioversion 2/2 amiodarone (did not hold initially on no antiarrhythmic) -counseled on monitoring for bleeding -doing well on amiodarone, liver checked 02/2023, check TSH for monitoring of amiodarone -continue CPAP for OSA   Moderate to severe aortic stenosis Mild dilation  of ascending aorta Elevated ASCVD risk -continue atorvastatin -prior echo 09/2020, repeat 09/2022 showed progression of stenosis. Ordered for 09/2023 -reviewed symptoms that need to be reported to me   Hypertension: -at goal today -continue amlodipine 10 mg daily, carvedilol 12.5 mg daily, furosemide 40 mg daily -reports he cannot take ACEi/MRA due to hyperkalemia, but he is currently on a potassium supplement with his furosemide. Could reassess  cautiously in the future if needed   Cardiac risk counseling and prevention recommendations: -recommend heart healthy/Mediterranean diet, with whole grains, fruits, vegetable, fish, lean meats, nuts, and olive oil. Limit salt. -recommend moderate walking, 3-5 times/week for 30-50 minutes each session. Aim for at least 150 minutes.week. Goal should be pace of 3 miles/hours, or walking 1.5 miles in 30 minutes -recommend avoidance of tobacco products. Avoid excess alcohol.  Dispo: Follow-up in 6 mos, or sooner as needed.  Signed, Jodelle Red, MD, PhD, [facility]  [facility]  [place]  [facility] at [facility] at [facility] [address], [address] [place], [place] [address] [phone]

## 2023-05-08 NOTE — Patient Instructions (Signed)
 Medication Instructions:   Your physician recommends that you continue on your current medications as directed. Please refer to the Current Medication list given to you today.   *If you need a refill on your cardiac medications before your next appointment, please call your pharmacy*   Lab Work:  TODAY!!!TSH  If you have labs (blood work) drawn today and your tests are completely normal, you will receive your results only by: MyChart Message (if you have MyChart) OR A paper copy in the mail If you have any lab test that is abnormal or we need to change your treatment, we will call you to review the results.   Testing/Procedures:  Your physician has requested that you have an echocardiogram. Echocardiography is a painless test that uses sound waves to create images of your heart. It provides your doctor with information about the size and shape of your heart and how well your heart's chambers and valves are working. This procedure takes approximately one hour. There are no restrictions for this procedure. Please do NOT wear cologne, aftershave, or lotions (deodorant is allowed). Please arrive 15 minutes prior to your appointment time.     Follow-Up: At [facility], you and your health needs are our priority.  As part of our continuing mission to provide you with exceptional heart care, we have created designated Provider Care Teams.  These Care Teams include your primary Cardiologist (physician) and Advanced Practice Providers (APPs -  Physician Assistants and Nurse Practitioners) who all work together to provide you with the care you need, when you need it.  We recommend signing up for the patient portal called "MyChart".  Sign up information is provided on this After Visit Summary.  MyChart is used to connect with patients for Virtual Visits (Telemedicine).  Patients are able to view lab/test results, encounter notes, upcoming appointments, etc.  Non-urgent messages can be  sent to your provider as well.   To learn more about what you can do with MyChart, go to [email or web address].    Your next appointment:   6 month(s)  Provider:   Jodelle Red, MD    Other Instructions  Your physician wants you to follow-up in: 6 months.  You will receive a reminder letter in the mail two months in advance. If you don't receive a letter, please call our office to schedule the follow-up appointment.

## 2023-05-09 LAB — TSH: TSH: 6.22 u[IU]/mL — ABNORMAL HIGH (ref 0.450–4.500)

## 2023-05-16 ENCOUNTER — Encounter (HOSPITAL_BASED_OUTPATIENT_CLINIC_OR_DEPARTMENT_OTHER): Payer: Self-pay

## 2023-05-16 DIAGNOSIS — I110 Hypertensive heart disease with heart failure: Secondary | ICD-10-CM | POA: Diagnosis not present

## 2023-05-16 DIAGNOSIS — Z79899 Other long term (current) drug therapy: Secondary | ICD-10-CM | POA: Diagnosis not present

## 2023-05-16 DIAGNOSIS — R002 Palpitations: Secondary | ICD-10-CM | POA: Diagnosis not present

## 2023-05-16 DIAGNOSIS — I480 Paroxysmal atrial fibrillation: Secondary | ICD-10-CM | POA: Diagnosis not present

## 2023-05-16 NOTE — Telephone Encounter (Addendum)
 Called and left message for patient to call back.   ----- Message from Alver Sorrow sent at 05/16/2023  9:20 AM EST ----- Thyroid hormone elevated.  Would recommend calling [facility] to ask if they can add on T3/T4.  If they cannot add on T3/T4, please ask him return for full thyroid panel.

## 2023-05-17 ENCOUNTER — Encounter (HOSPITAL_BASED_OUTPATIENT_CLINIC_OR_DEPARTMENT_OTHER): Payer: Self-pay

## 2023-05-17 DIAGNOSIS — E7849 Other hyperlipidemia: Secondary | ICD-10-CM

## 2023-05-17 LAB — THYROID PANEL WITH TSH
Free Thyroxine Index: 2.3 (ref 1.2–4.9)
T3 Uptake Ratio: 31 % (ref 24–39)
T4, Total: 7.4 ug/dL (ref 4.5–12.0)
TSH: 6.72 u[IU]/mL — ABNORMAL HIGH (ref 0.450–4.500)

## 2023-05-17 NOTE — Telephone Encounter (Signed)
-----   Message from Alver Sorrow sent at 05/17/2023  8:11 AM EST ----- Thyroid hormone mildly elevated with normal T3 and T4.  Indicative of subclinical hypothyroidism.  No indication for any medication changes at this time.  Would recommend repeat thyroid panel in 3 to 4 months to ensure stability of thyroid numbers.

## 2023-05-29 DIAGNOSIS — D225 Melanocytic nevi of trunk: Secondary | ICD-10-CM | POA: Diagnosis not present

## 2023-05-29 DIAGNOSIS — D2372 Other benign neoplasm of skin of left lower limb, including hip: Secondary | ICD-10-CM | POA: Diagnosis not present

## 2023-05-29 DIAGNOSIS — Z85828 Personal history of other malignant neoplasm of skin: Secondary | ICD-10-CM | POA: Diagnosis not present

## 2023-05-29 DIAGNOSIS — L570 Actinic keratosis: Secondary | ICD-10-CM | POA: Diagnosis not present

## 2023-05-29 DIAGNOSIS — D2271 Melanocytic nevi of right lower limb, including hip: Secondary | ICD-10-CM | POA: Diagnosis not present

## 2023-05-29 DIAGNOSIS — L905 Scar conditions and fibrosis of skin: Secondary | ICD-10-CM | POA: Diagnosis not present

## 2023-05-29 DIAGNOSIS — D1801 Hemangioma of skin and subcutaneous tissue: Secondary | ICD-10-CM | POA: Diagnosis not present

## 2023-05-29 DIAGNOSIS — L814 Other melanin hyperpigmentation: Secondary | ICD-10-CM | POA: Diagnosis not present

## 2023-05-29 DIAGNOSIS — L821 Other seborrheic keratosis: Secondary | ICD-10-CM | POA: Diagnosis not present

## 2023-06-19 ENCOUNTER — Other Ambulatory Visit: Payer: Self-pay | Admitting: *Deleted

## 2023-06-19 DIAGNOSIS — I480 Paroxysmal atrial fibrillation: Secondary | ICD-10-CM

## 2023-06-19 DIAGNOSIS — I5031 Acute diastolic (congestive) heart failure: Secondary | ICD-10-CM

## 2023-06-19 MED ORDER — FUROSEMIDE 40 MG PO TABS: ORAL_TABLET | ORAL | 0 refills | Status: DC

## 2023-06-25 ENCOUNTER — Encounter (HOSPITAL_BASED_OUTPATIENT_CLINIC_OR_DEPARTMENT_OTHER): Payer: Self-pay

## 2023-07-23 ENCOUNTER — Other Ambulatory Visit: Payer: Self-pay | Admitting: *Deleted

## 2023-07-23 MED ORDER — AMLODIPINE BESYLATE 10 MG PO TABS: ORAL_TABLET | ORAL | 0 refills | Status: DC

## 2023-07-26 ENCOUNTER — Ambulatory Visit: Payer: Self-pay | Admitting: Pulmonary Disease

## 2023-07-26 NOTE — Telephone Encounter (Signed)
 Copied from CRM [id number]. Topic: Clinical - Red Word Triage >> Jul 26, 2023  3:06 PM Ilene Malling wrote: Red Word that prompted transfer to Nurse Triage: Patient [phone] uses a [employer] mini cpap states in the middle of night the nose area startes burning the last two times patient used it. Patient denies any other symptoms. Patient states has replacement both filters, unsure if the machine is working. Patient asked if he could use nasal spray prior to using the cpap machine. Please advise. Reason for Disposition  [1] Caller requesting NON-URGENT health information AND [2] PCP's office is the best resource  Answer Assessment - Initial Assessment Questions 1. REASON FOR CALL or QUESTION: "What is your reason for calling today?" or "How can I best help you?" or "What question do you have that I can help answer?"     CPAP issues with "Air Mini" tank-less type - "when I start using it it's fine" then an 1-2 hrs later, then my nose starts burning".  Pt reports having machine x 5-6 years, only uses when traveling. No changes in pressure. Pt reports using new filter, so unsure what to do.  Called into [facility] -- spoke to rep and he checked his clinical guidelines to see if any barrier can be applied to lessen burning sensation - does not have any recs for this particular situation.  Protocols used: Information Only Call - No Triage-A-AH

## 2023-07-30 NOTE — Telephone Encounter (Signed)
 Any suggestions on nasal spray or barrier for nasal burning with CPAP use?

## 2023-07-31 ENCOUNTER — Ambulatory Visit (HOSPITAL_BASED_OUTPATIENT_CLINIC_OR_DEPARTMENT_OTHER): Admitting: Pulmonary Disease

## 2023-07-31 NOTE — Telephone Encounter (Signed)
 Pt.notified

## 2023-08-21 ENCOUNTER — Other Ambulatory Visit: Payer: Self-pay | Admitting: *Deleted

## 2023-08-21 MED ORDER — AMLODIPINE BESYLATE 10 MG PO TABS
ORAL_TABLET | ORAL | 0 refills | Status: DC
Start: 2023-08-21 — End: 2023-09-24

## 2023-08-28 DIAGNOSIS — L570 Actinic keratosis: Secondary | ICD-10-CM | POA: Diagnosis not present

## 2023-08-28 DIAGNOSIS — L821 Other seborrheic keratosis: Secondary | ICD-10-CM | POA: Diagnosis not present

## 2023-08-28 DIAGNOSIS — I8311 Varicose veins of right lower extremity with inflammation: Secondary | ICD-10-CM | POA: Diagnosis not present

## 2023-08-28 DIAGNOSIS — D0439 Carcinoma in situ of skin of other parts of face: Secondary | ICD-10-CM | POA: Diagnosis not present

## 2023-08-28 DIAGNOSIS — D2372 Other benign neoplasm of skin of left lower limb, including hip: Secondary | ICD-10-CM | POA: Diagnosis not present

## 2023-08-28 DIAGNOSIS — D225 Melanocytic nevi of trunk: Secondary | ICD-10-CM | POA: Diagnosis not present

## 2023-08-28 DIAGNOSIS — I872 Venous insufficiency (chronic) (peripheral): Secondary | ICD-10-CM | POA: Diagnosis not present

## 2023-08-28 DIAGNOSIS — I8312 Varicose veins of left lower extremity with inflammation: Secondary | ICD-10-CM | POA: Diagnosis not present

## 2023-08-28 DIAGNOSIS — Z85828 Personal history of other malignant neoplasm of skin: Secondary | ICD-10-CM | POA: Diagnosis not present

## 2023-08-28 DIAGNOSIS — D0421 Carcinoma in situ of skin of right ear and external auricular canal: Secondary | ICD-10-CM | POA: Diagnosis not present

## 2023-08-28 DIAGNOSIS — D1801 Hemangioma of skin and subcutaneous tissue: Secondary | ICD-10-CM | POA: Diagnosis not present

## 2023-08-28 DIAGNOSIS — D2271 Melanocytic nevi of right lower limb, including hip: Secondary | ICD-10-CM | POA: Diagnosis not present

## 2023-08-29 ENCOUNTER — Encounter: Payer: Self-pay | Admitting: Hematology & Oncology

## 2023-08-29 ENCOUNTER — Inpatient Hospital Stay: Payer: Medicare Other | Attending: Hematology & Oncology

## 2023-08-29 ENCOUNTER — Inpatient Hospital Stay (HOSPITAL_BASED_OUTPATIENT_CLINIC_OR_DEPARTMENT_OTHER): Payer: Medicare Other | Admitting: Hematology & Oncology

## 2023-08-29 VITALS — BP 104/55 | HR 52 | Temp 97.8°F | Resp 18 | Ht 73.0 in | Wt 216.0 lb

## 2023-08-29 DIAGNOSIS — D5912 Cold autoimmune hemolytic anemia: Secondary | ICD-10-CM | POA: Diagnosis not present

## 2023-08-29 DIAGNOSIS — Z79899 Other long term (current) drug therapy: Secondary | ICD-10-CM | POA: Insufficient documentation

## 2023-08-29 LAB — CMP (CANCER CENTER ONLY)
ALT: 29 U/L (ref 0–44)
AST: 16 U/L (ref 15–41)
Albumin: 4.2 g/dL (ref 3.5–5.0)
Alkaline Phosphatase: 63 U/L (ref 38–126)
Anion gap: 6 (ref 5–15)
BUN: 18 mg/dL (ref 8–23)
CO2: 33 mmol/L — ABNORMAL HIGH (ref 22–32)
Calcium: 9 mg/dL (ref 8.9–10.3)
Chloride: 103 mmol/L (ref 98–111)
Creatinine: 1.08 mg/dL (ref 0.61–1.24)
GFR, Estimated: >60 mL/min (ref >60)
Glucose, Bld: 155 mg/dL — ABNORMAL HIGH (ref 70–99)
Potassium: 4.5 mmol/L (ref 3.5–5.1)
Sodium: 142 mmol/L (ref 135–145)
Total Bilirubin: 2.7 mg/dL — ABNORMAL HIGH (ref 0.0–1.2)
Total Protein: 6.5 g/dL (ref 6.5–8.1)

## 2023-08-29 LAB — SAMPLE TO BLOOD BANK

## 2023-08-29 LAB — RETICULOCYTES: RBC.: 1.83 MIL/uL — ABNORMAL LOW (ref 4.22–5.81)

## 2023-08-29 LAB — CBC WITH DIFFERENTIAL (CANCER CENTER ONLY)
Abs Immature Granulocytes: 0.03 10*3/uL (ref 0.00–0.07)
Basophils Absolute: 0 10*3/uL (ref 0.0–0.1)
Basophils Relative: 1 %
Eosinophils Absolute: 0.1 10*3/uL (ref 0.0–0.5)
Eosinophils Relative: 1 %
HCT: 33.2 % — ABNORMAL LOW (ref 39.0–52.0)
Hemoglobin: 11.6 g/dL — ABNORMAL LOW (ref 13.0–17.0)
Immature Granulocytes: 1 %
Lymphocytes Relative: 26 %
Lymphs Abs: 1.1 10*3/uL (ref 0.7–4.0)
MCH: 34.5 pg — ABNORMAL HIGH (ref 26.0–34.0)
MCHC: 34.9 g/dL (ref 30.0–36.0)
MCV: 98.8 fL (ref 80.0–100.0)
Monocytes Absolute: 0.3 10*3/uL (ref 0.1–1.0)
Monocytes Relative: 6 %
Neutro Abs: 2.8 10*3/uL (ref 1.7–7.7)
Neutrophils Relative %: 65 %
Platelet Count: 190 10*3/uL (ref 150–400)
RBC: 3.36 MIL/uL — ABNORMAL LOW (ref 4.22–5.81)
RDW: 14.1 % (ref 11.5–15.5)
WBC Count: 4.3 10*3/uL (ref 4.0–10.5)
nRBC: 0 % (ref 0.0–0.2)

## 2023-08-29 LAB — LACTATE DEHYDROGENASE: LDH: 158 U/L (ref 98–192)

## 2023-08-29 NOTE — Progress Notes (Signed)
 Hematology and Oncology Follow Up Visit  Steven Shepard [id number] December 30, 1945 78 y.o. 08/29/2023   Principle Diagnosis:  Cold agglutinin disease  Current Therapy:   Folic acid 2 mg p.o. daily      Interim History:  Steven Shepard is  back for another visit.  We see him every 6 months.  He has now retired.  He goes to the beach all the time.  He actually is going this weekend.  He is on his folic acid.  He is doing well on the folic acid.  He has had no problems with his atrial fibrillation.  He is on amiodarone .  He is on Eliquis.  He has had no change in bowel or bladder habits.  He has had no rashes.  His last cold agglutinin titer was 1: 256.  He has had no problems with COVID or Influenza.  Has been no pain or numbness in the hands or feet.  Currently, I would say his performance status is probably ECOG 0.  Medications:  Current Outpatient Medications:    acetaminophen  (TYLENOL ) 650 MG CR tablet, Take 650 mg by mouth 2 (two) times daily as needed for pain., Disp: , Rfl:    amiodarone  (PACERONE ) 200 MG tablet, Take 1 tablet (200 mg total) by mouth daily., Disp: 90 tablet, Rfl: 3   amLODipine  (NORVASC ) 10 MG tablet, TAKE 1 TABLET(10 MG) BY MOUTH DAILY, Disp: 30 tablet, Rfl: 0   atorvastatin  (LIPITOR) 20 MG tablet, TAKE 1 TABLET(20 MG) BY MOUTH DAILY, Disp: 90 tablet, Rfl: 3   carvedilol  (COREG ) 12.5 MG tablet, TAKE 1 TABLET(12.5 MG TOTAL) BY MOUTH TWICE DAILY WITH A MEAL., Disp: 180 tablet, Rfl: 3   fexofenadine (ALLEGRA) 180 MG tablet, Take 180 mg by mouth in the morning., Disp: , Rfl:    folic acid (FOLVITE) 800 MCG tablet, Take 800 mcg by mouth 2 (two) times daily., Disp: , Rfl:    furosemide  (LASIX ) 40 MG tablet, TAKE 1 TABLET BY MOUTH TWICE DAILY AT 8 AM AND AT 2 PM, Disp: 180 tablet, Rfl: 0   Krill Oil 500 MG CAPS, Take 500 mg by mouth daily with supper., Disp: , Rfl:    metFORMIN  (GLUCOPHAGE ) 500 MG tablet, TAKE 1 TABLET(500 MG) BY MOUTH DAILY WITH BREAKFAST, Disp:  90 tablet, Rfl: 3   Misc Natural Product Nasal (NASAL CLEANSE RINSE MIX NA), Place into the nose in the morning. NeilMed Sinus Rinse, Disp: , Rfl:    Multiple Vitamins-Minerals (ONE-A-DAY MENS 50+) TABS, Take 1 tablet by mouth daily with breakfast., Disp: , Rfl:    potassium chloride  SA (KLOR-CON  M) 20 MEQ tablet, TAKE 1 TABLET(20 MEQ) BY MOUTH AT 8 AM AND AT 2 PM AS DIRECTED, Disp: 180 tablet, Rfl: 3   PRESCRIPTION MEDICATION, Pt has CPAP Machine, Disp: , Rfl:    Psyllium (METAMUCIL FIBER PO), Take by mouth See admin instructions. Mix 1 well-rounded teaspoonful of powder into 4-8 ounces of water  with one Emergen-C packet and drink every morning, Disp: , Rfl:    tamsulosin  (FLOMAX ) 0.4 MG CAPS capsule, TAKE 1 CAPSULE(0.4 MG) BY MOUTH DAILY, Disp: 90 capsule, Rfl: 1   XARELTO  20 MG TABS tablet, TAKE 1 TABLET(20 MG) BY MOUTH DAILY WITH SUPPER., Disp: 90 tablet, Rfl: 1   AMOXICILLIN PO, Take by mouth. Before dental work only (Patient not taking: Reported on 08/29/2023), Disp: , Rfl:   Allergies:  Allergies  Allergen Reactions   Ace Inhibitors Other (See Comments)    High K+  Codeine Other (See Comments)    Hallucinations   Spironolactone Other (See Comments)    Increases potassium    Past Medical History, Surgical history, Social history, and Family History were reviewed and updated.  Review of Systems: Review of Systems  Constitutional: Negative.   HENT:  Negative.    Eyes: Negative.   Respiratory: Negative.    Cardiovascular: Negative.   Gastrointestinal: Negative.   Endocrine: Negative.   Genitourinary: Negative.    Musculoskeletal: Negative.   Skin: Negative.   Neurological: Negative.   Hematological: Negative.   Psychiatric/Behavioral: Negative.      Physical Exam:  height is 6' 1 (1.854 m) and weight is 216 lb (98 kg). His oral temperature is 97.8 F (36.6 C). His blood pressure is 104/55 (abnormal) and his pulse is 52 (abnormal). His respiration is 18 and oxygen  saturation is 100%.   Wt Readings from Last 3 Encounters:  08/29/23 216 lb (98 kg)  05/08/23 234 lb 4.8 oz (106.3 kg)  04/23/23 225 lb (102.1 kg)    Physical Exam Vitals reviewed.  HENT:     Head: Normocephalic and atraumatic.   Eyes:     Pupils: Pupils are equal, round, and reactive to light.    Cardiovascular:     Rate and Rhythm: Normal rate and regular rhythm.     Heart sounds: Normal heart sounds.  Pulmonary:     Effort: Pulmonary effort is normal.     Breath sounds: Normal breath sounds.  Abdominal:     General: Bowel sounds are normal.     Palpations: Abdomen is soft.   Musculoskeletal:        General: No tenderness or deformity. Normal range of motion.     Cervical back: Normal range of motion.  Lymphadenopathy:     Cervical: No cervical adenopathy.   Skin:    General: Skin is warm and dry.     Findings: No erythema or rash.   Neurological:     Mental Status: He is alert and oriented to person, place, and time.   Psychiatric:        Behavior: Behavior normal.        Thought Content: Thought content normal.        Judgment: Judgment normal.     Lab Results  Component Value Date   WBC 6.5 02/28/2023   HGB 12.5 (L) 02/28/2023   HCT 39.0 02/28/2023   MCV 95.1 02/28/2023   PLT 200 02/28/2023     Chemistry      Component Value Date/Time   NA 142 08/29/2023 0950   NA 141 11/19/2022 1514   K 4.5 08/29/2023 0950   CL 103 08/29/2023 0950   CO2 33 (H) 08/29/2023 0950   BUN 18 08/29/2023 0950   BUN 17 11/19/2022 1514   CREATININE 1.08 08/29/2023 0950   CREATININE 0.89 01/13/2020 0906      Component Value Date/Time   CALCIUM  9.0 08/29/2023 0950   ALKPHOS 63 08/29/2023 0950   AST 16 08/29/2023 0950   ALT 29 08/29/2023 0950   BILITOT 2.7 (H) 08/29/2023 0950      Impression and Plan: Steven Shepard is a very nice 78 year old white male.  He has cold agglutinin disease.  As far as I can tell, this is idiopathic.  There is no associated  lymphoproliferative disease or autoimmune disease that we have been able to find..  It is no surprise that his hemoglobin is so good right now.  His bilirubin is good  indicator for him.  His bilirubin is relatively stable.  His LDH is also stable so that is encouraging.  I think that as long as he takes his folic acid, should be okay.  Overall, I really think that he should continue to do well.  The warm weather definitely will help him.  Will plan to get him back in 6 more months.    Ivor Mars, MD 6/19/202510:51 AM

## 2023-08-30 LAB — COLD AGGLUTININ TITER: Cold Agglutinin Titer: NEGATIVE

## 2023-09-04 ENCOUNTER — Ambulatory Visit (INDEPENDENT_AMBULATORY_CARE_PROVIDER_SITE_OTHER): Payer: Medicare Other

## 2023-09-04 DIAGNOSIS — I350 Nonrheumatic aortic (valve) stenosis: Secondary | ICD-10-CM | POA: Diagnosis not present

## 2023-09-04 LAB — ECHOCARDIOGRAM COMPLETE
AR max vel: 0.68 cm2
AV Area VTI: 0.75 cm2
AV Area mean vel: 0.64 cm2
AV Mean grad: 50 mmHg
AV Peak grad: 87.2 mmHg
Ao pk vel: 4.67 m/s
Area-P 1/2: 2.99 cm2
S' Lateral: 1.99 cm

## 2023-09-06 DIAGNOSIS — J449 Chronic obstructive pulmonary disease, unspecified: Secondary | ICD-10-CM | POA: Diagnosis not present

## 2023-09-06 DIAGNOSIS — Z1330 Encounter for screening examination for mental health and behavioral disorders, unspecified: Secondary | ICD-10-CM | POA: Diagnosis not present

## 2023-09-06 DIAGNOSIS — E7800 Pure hypercholesterolemia, unspecified: Secondary | ICD-10-CM | POA: Diagnosis not present

## 2023-09-06 DIAGNOSIS — M0600 Rheumatoid arthritis without rheumatoid factor, unspecified site: Secondary | ICD-10-CM | POA: Diagnosis not present

## 2023-09-06 DIAGNOSIS — D049 Carcinoma in situ of skin, unspecified: Secondary | ICD-10-CM | POA: Diagnosis not present

## 2023-09-06 DIAGNOSIS — R7303 Prediabetes: Secondary | ICD-10-CM | POA: Diagnosis not present

## 2023-09-06 DIAGNOSIS — D5912 Cold autoimmune hemolytic anemia: Secondary | ICD-10-CM | POA: Diagnosis not present

## 2023-09-06 DIAGNOSIS — I10 Essential (primary) hypertension: Secondary | ICD-10-CM | POA: Diagnosis not present

## 2023-09-06 DIAGNOSIS — Z125 Encounter for screening for malignant neoplasm of prostate: Secondary | ICD-10-CM | POA: Diagnosis not present

## 2023-09-06 DIAGNOSIS — I4811 Longstanding persistent atrial fibrillation: Secondary | ICD-10-CM | POA: Diagnosis not present

## 2023-09-17 ENCOUNTER — Other Ambulatory Visit: Payer: Self-pay | Admitting: *Deleted

## 2023-09-17 DIAGNOSIS — I5031 Acute diastolic (congestive) heart failure: Secondary | ICD-10-CM

## 2023-09-17 DIAGNOSIS — I480 Paroxysmal atrial fibrillation: Secondary | ICD-10-CM

## 2023-09-17 MED ORDER — FUROSEMIDE 40 MG PO TABS: ORAL_TABLET | ORAL | 0 refills | Status: DC

## 2023-09-24 ENCOUNTER — Other Ambulatory Visit: Payer: Self-pay | Admitting: *Deleted

## 2023-09-24 MED ORDER — AMLODIPINE BESYLATE 10 MG PO TABS: ORAL_TABLET | ORAL | 3 refills | Status: DC

## 2023-09-26 ENCOUNTER — Other Ambulatory Visit (HOSPITAL_BASED_OUTPATIENT_CLINIC_OR_DEPARTMENT_OTHER): Payer: Self-pay | Admitting: Cardiology

## 2023-09-26 DIAGNOSIS — E7849 Other hyperlipidemia: Secondary | ICD-10-CM

## 2023-09-26 DIAGNOSIS — I110 Hypertensive heart disease with heart failure: Secondary | ICD-10-CM

## 2023-09-30 ENCOUNTER — Other Ambulatory Visit: Payer: Self-pay | Admitting: *Deleted

## 2023-09-30 DIAGNOSIS — C44222 Squamous cell carcinoma of skin of right ear and external auricular canal: Secondary | ICD-10-CM | POA: Diagnosis not present

## 2023-09-30 DIAGNOSIS — C44329 Squamous cell carcinoma of skin of other parts of face: Secondary | ICD-10-CM | POA: Diagnosis not present

## 2023-09-30 DIAGNOSIS — Z85828 Personal history of other malignant neoplasm of skin: Secondary | ICD-10-CM | POA: Diagnosis not present

## 2023-09-30 MED ORDER — ATORVASTATIN CALCIUM 20 MG PO TABS: ORAL_TABLET | ORAL | 1 refills | Status: AC

## 2023-10-03 ENCOUNTER — Ambulatory Visit (HOSPITAL_BASED_OUTPATIENT_CLINIC_OR_DEPARTMENT_OTHER): Admitting: Pulmonary Disease

## 2023-10-03 ENCOUNTER — Encounter (HOSPITAL_BASED_OUTPATIENT_CLINIC_OR_DEPARTMENT_OTHER): Payer: Self-pay | Admitting: Pulmonary Disease

## 2023-10-03 VITALS — BP 116/60 | HR 54 | Ht 73.0 in | Wt 209.0 lb

## 2023-10-03 DIAGNOSIS — G4733 Obstructive sleep apnea (adult) (pediatric): Secondary | ICD-10-CM | POA: Diagnosis not present

## 2023-10-03 NOTE — Progress Notes (Signed)
   Subjective:    Patient ID: Steven Shepard, male    DOB: 1945/03/27, 78 y.o.   MRN: [id number]   78 yo ex smoker, with mild COPD & A fibn for annual  FU of obstructive sleep apnea  -retired Veterinary surgeon  -CPAP 9 cm   PMH - AF   Discussed the use of AI scribe software for clinical note transcription with the patient, who gave verbal consent to proceed.  History of Present Illness Steven Shepard is a 78 year old male with obstructive sleep apnea and atrial fibrillation who presents for an eighteen month follow-up.  He is compliant with CPAP therapy, using two machines based on location. [place] the [place], and the older one is at home. He does not sleep without the CPAP machine, emphasizing its necessity for his condition. He engages in physical activities such as walking his dog and working in the yard.    DL reviewed over phone >> good compliance, no residual events, mild leak Significant tests/ events reviewed PSG 09/2010  severe obstructive sleep apnea with AHI 86/h corrected by CPAP 9 cm  Maintained on CPAP 9cm, small nasal comfort gel mask,    2010 Spirometry FEV1 83%, ratio 69    Review of Systems  neg for any significant sore throat, dysphagia, itching, sneezing, nasal congestion or excess/ purulent secretions, fever, chills, sweats, unintended wt loss, pleuritic or exertional cp, hempoptysis, orthopnea pnd or change in chronic leg swelling. Also denies presyncope, palpitations, heartburn, abdominal pain, nausea, vomiting, diarrhea or change in bowel or urinary habits, dysuria,hematuria, rash, arthralgias, visual complaints, headache, numbness weakness or ataxia.      Objective:   Physical Exam  Gen. Pleasant, well-nourished, in no distress ENT - no thrush, no pallor/icterus,no post nasal drip Neck: No JVD, no thyromegaly, no carotid bruits Lungs: no use of accessory muscles, no dullness to percussion, clear without rales or rhonchi   Cardiovascular: Rhythm regular, heart sounds  normal, ESM 2/6 at base, no peripheral edema Musculoskeletal: No deformities, no cyanosis or clubbing        Assessment & Plan:   Assessment and Plan Assessment & Plan Obstructive Sleep Apnea (OSA) OSA is well-controlled with CPAP therapy. Compliance is excellent, using two machines for travel. - Continue CPAP therapy with current machines.  Atrial Fibrillation Atrial fibrillation is well-controlled with Coreg , Omidria, Xarelto , and amiodarone . Recent cardioversion was successful after initial failure, and he has remained in rhythm since. AFib is not directly related to OSA in this case. - Continue current medications: Coreg , Omidria, Xarelto , and amiodarone . - Monitor heart rhythm and medication efficacy annually.  Aortic Stenosis Presence of a 2/6 systolic murmur at the base, likely related to moderate aortic stenosis as noted on echocardiogram from June 2025. No acute changes or symptoms reported.

## 2023-10-03 NOTE — Patient Instructions (Signed)
 CPAP supplies renewed x 1 year

## 2023-10-07 DIAGNOSIS — R7303 Prediabetes: Secondary | ICD-10-CM | POA: Diagnosis not present

## 2023-10-07 DIAGNOSIS — I4811 Longstanding persistent atrial fibrillation: Secondary | ICD-10-CM | POA: Diagnosis not present

## 2023-10-07 DIAGNOSIS — I10 Essential (primary) hypertension: Secondary | ICD-10-CM | POA: Diagnosis not present

## 2023-10-07 DIAGNOSIS — D5912 Cold autoimmune hemolytic anemia: Secondary | ICD-10-CM | POA: Diagnosis not present

## 2023-10-07 DIAGNOSIS — E7800 Pure hypercholesterolemia, unspecified: Secondary | ICD-10-CM | POA: Diagnosis not present

## 2023-10-07 DIAGNOSIS — D049 Carcinoma in situ of skin, unspecified: Secondary | ICD-10-CM | POA: Diagnosis not present

## 2023-10-07 DIAGNOSIS — M0600 Rheumatoid arthritis without rheumatoid factor, unspecified site: Secondary | ICD-10-CM | POA: Diagnosis not present

## 2023-10-07 DIAGNOSIS — J449 Chronic obstructive pulmonary disease, unspecified: Secondary | ICD-10-CM | POA: Diagnosis not present

## 2023-10-09 ENCOUNTER — Other Ambulatory Visit: Payer: Self-pay | Admitting: Cardiology

## 2023-10-09 DIAGNOSIS — I480 Paroxysmal atrial fibrillation: Secondary | ICD-10-CM

## 2023-10-10 NOTE — Telephone Encounter (Signed)
 Prescription refill request for Xarelto  received.  Indication: afib  Last office visit: Christopher,05/08/2023 Weight: 94.8 kg  Age: 78 yo  Scr: 1.1, 09/06/2023 CrCl: 77 ml/min   Refill sent.

## 2023-10-15 ENCOUNTER — Encounter (HOSPITAL_BASED_OUTPATIENT_CLINIC_OR_DEPARTMENT_OTHER): Payer: Self-pay

## 2023-10-16 ENCOUNTER — Encounter (HOSPITAL_BASED_OUTPATIENT_CLINIC_OR_DEPARTMENT_OTHER): Payer: Self-pay | Admitting: Pulmonary Disease

## 2023-10-16 DIAGNOSIS — G4733 Obstructive sleep apnea (adult) (pediatric): Secondary | ICD-10-CM

## 2023-10-17 ENCOUNTER — Ambulatory Visit (HOSPITAL_BASED_OUTPATIENT_CLINIC_OR_DEPARTMENT_OTHER): Payer: Self-pay | Admitting: Cardiology

## 2023-10-17 NOTE — Telephone Encounter (Signed)
 Please advise on your return if new CPAP order is okay

## 2023-11-04 DIAGNOSIS — D049 Carcinoma in situ of skin, unspecified: Secondary | ICD-10-CM | POA: Diagnosis not present

## 2023-11-04 DIAGNOSIS — R7303 Prediabetes: Secondary | ICD-10-CM | POA: Diagnosis not present

## 2023-11-04 DIAGNOSIS — E7800 Pure hypercholesterolemia, unspecified: Secondary | ICD-10-CM | POA: Diagnosis not present

## 2023-11-04 DIAGNOSIS — I10 Essential (primary) hypertension: Secondary | ICD-10-CM | POA: Diagnosis not present

## 2023-11-04 DIAGNOSIS — I4811 Longstanding persistent atrial fibrillation: Secondary | ICD-10-CM | POA: Diagnosis not present

## 2023-11-04 DIAGNOSIS — J449 Chronic obstructive pulmonary disease, unspecified: Secondary | ICD-10-CM | POA: Diagnosis not present

## 2023-11-04 DIAGNOSIS — M0600 Rheumatoid arthritis without rheumatoid factor, unspecified site: Secondary | ICD-10-CM | POA: Diagnosis not present

## 2023-11-04 DIAGNOSIS — D5912 Cold autoimmune hemolytic anemia: Secondary | ICD-10-CM | POA: Diagnosis not present

## 2023-11-04 NOTE — Telephone Encounter (Signed)
 Can someone please check on this for pt?

## 2023-11-14 NOTE — Telephone Encounter (Signed)
 I have confirmed with Steven Shepard that he is scheduled to be setup on 11/21/23

## 2023-11-14 NOTE — Telephone Encounter (Signed)
 I have sent an urgent message to [employer] about this issue

## 2023-11-14 NOTE — Telephone Encounter (Signed)
 Received a message from [place] with [employer] [facility], Steven Shepard   Per notes, patient was contacted on 11-13-2023 332pm and has been scheduled for setup on 11-21-2023 130pm at our [place] office.

## 2023-12-04 DIAGNOSIS — M0600 Rheumatoid arthritis without rheumatoid factor, unspecified site: Secondary | ICD-10-CM | POA: Diagnosis not present

## 2023-12-04 DIAGNOSIS — I10 Essential (primary) hypertension: Secondary | ICD-10-CM | POA: Diagnosis not present

## 2023-12-04 DIAGNOSIS — I4811 Longstanding persistent atrial fibrillation: Secondary | ICD-10-CM | POA: Diagnosis not present

## 2023-12-04 DIAGNOSIS — R7303 Prediabetes: Secondary | ICD-10-CM | POA: Diagnosis not present

## 2023-12-04 DIAGNOSIS — E7800 Pure hypercholesterolemia, unspecified: Secondary | ICD-10-CM | POA: Diagnosis not present

## 2023-12-04 DIAGNOSIS — D049 Carcinoma in situ of skin, unspecified: Secondary | ICD-10-CM | POA: Diagnosis not present

## 2023-12-04 DIAGNOSIS — D5912 Cold autoimmune hemolytic anemia: Secondary | ICD-10-CM | POA: Diagnosis not present

## 2023-12-04 DIAGNOSIS — J449 Chronic obstructive pulmonary disease, unspecified: Secondary | ICD-10-CM | POA: Diagnosis not present

## 2023-12-06 DIAGNOSIS — H2513 Age-related nuclear cataract, bilateral: Secondary | ICD-10-CM | POA: Diagnosis not present

## 2023-12-06 DIAGNOSIS — H5203 Hypermetropia, bilateral: Secondary | ICD-10-CM | POA: Diagnosis not present

## 2023-12-06 DIAGNOSIS — E119 Type 2 diabetes mellitus without complications: Secondary | ICD-10-CM | POA: Diagnosis not present

## 2023-12-06 LAB — HM DIABETES EYE EXAM

## 2023-12-10 DIAGNOSIS — L820 Inflamed seborrheic keratosis: Secondary | ICD-10-CM | POA: Diagnosis not present

## 2023-12-10 DIAGNOSIS — L821 Other seborrheic keratosis: Secondary | ICD-10-CM | POA: Diagnosis not present

## 2023-12-10 DIAGNOSIS — D2372 Other benign neoplasm of skin of left lower limb, including hip: Secondary | ICD-10-CM | POA: Diagnosis not present

## 2023-12-10 DIAGNOSIS — D485 Neoplasm of uncertain behavior of skin: Secondary | ICD-10-CM | POA: Diagnosis not present

## 2023-12-10 DIAGNOSIS — L570 Actinic keratosis: Secondary | ICD-10-CM | POA: Diagnosis not present

## 2023-12-10 DIAGNOSIS — L814 Other melanin hyperpigmentation: Secondary | ICD-10-CM | POA: Diagnosis not present

## 2023-12-10 DIAGNOSIS — D1801 Hemangioma of skin and subcutaneous tissue: Secondary | ICD-10-CM | POA: Diagnosis not present

## 2023-12-10 DIAGNOSIS — D225 Melanocytic nevi of trunk: Secondary | ICD-10-CM | POA: Diagnosis not present

## 2023-12-10 DIAGNOSIS — Z85828 Personal history of other malignant neoplasm of skin: Secondary | ICD-10-CM | POA: Diagnosis not present

## 2023-12-19 ENCOUNTER — Other Ambulatory Visit: Payer: Self-pay | Admitting: Family Medicine

## 2023-12-19 DIAGNOSIS — I480 Paroxysmal atrial fibrillation: Secondary | ICD-10-CM

## 2023-12-19 DIAGNOSIS — I5031 Acute diastolic (congestive) heart failure: Secondary | ICD-10-CM

## 2023-12-24 DIAGNOSIS — Z23 Encounter for immunization: Secondary | ICD-10-CM | POA: Diagnosis not present

## 2024-01-03 ENCOUNTER — Ambulatory Visit (HOSPITAL_BASED_OUTPATIENT_CLINIC_OR_DEPARTMENT_OTHER): Admitting: Cardiology

## 2024-01-03 DIAGNOSIS — R7303 Prediabetes: Secondary | ICD-10-CM | POA: Diagnosis not present

## 2024-01-03 DIAGNOSIS — M0600 Rheumatoid arthritis without rheumatoid factor, unspecified site: Secondary | ICD-10-CM | POA: Diagnosis not present

## 2024-01-03 DIAGNOSIS — J449 Chronic obstructive pulmonary disease, unspecified: Secondary | ICD-10-CM | POA: Diagnosis not present

## 2024-01-03 DIAGNOSIS — D5912 Cold autoimmune hemolytic anemia: Secondary | ICD-10-CM | POA: Diagnosis not present

## 2024-01-03 DIAGNOSIS — I4811 Longstanding persistent atrial fibrillation: Secondary | ICD-10-CM | POA: Diagnosis not present

## 2024-01-03 DIAGNOSIS — D049 Carcinoma in situ of skin, unspecified: Secondary | ICD-10-CM | POA: Diagnosis not present

## 2024-01-03 DIAGNOSIS — E7800 Pure hypercholesterolemia, unspecified: Secondary | ICD-10-CM | POA: Diagnosis not present

## 2024-01-03 DIAGNOSIS — I10 Essential (primary) hypertension: Secondary | ICD-10-CM | POA: Diagnosis not present

## 2024-01-07 ENCOUNTER — Other Ambulatory Visit: Payer: Self-pay | Admitting: Family Medicine

## 2024-01-08 ENCOUNTER — Other Ambulatory Visit: Payer: Self-pay | Admitting: Family Medicine

## 2024-01-12 ENCOUNTER — Other Ambulatory Visit: Payer: Self-pay | Admitting: Family Medicine

## 2024-01-20 ENCOUNTER — Ambulatory Visit (INDEPENDENT_AMBULATORY_CARE_PROVIDER_SITE_OTHER): Admitting: Cardiology

## 2024-01-20 ENCOUNTER — Encounter (HOSPITAL_BASED_OUTPATIENT_CLINIC_OR_DEPARTMENT_OTHER): Payer: Self-pay | Admitting: Cardiology

## 2024-01-20 VITALS — BP 118/60 | HR 57 | Ht 73.0 in | Wt 206.2 lb

## 2024-01-20 DIAGNOSIS — Z7901 Long term (current) use of anticoagulants: Secondary | ICD-10-CM | POA: Diagnosis not present

## 2024-01-20 DIAGNOSIS — I350 Nonrheumatic aortic (valve) stenosis: Secondary | ICD-10-CM

## 2024-01-20 DIAGNOSIS — D6869 Other thrombophilia: Secondary | ICD-10-CM | POA: Diagnosis not present

## 2024-01-20 DIAGNOSIS — Z79899 Other long term (current) drug therapy: Secondary | ICD-10-CM

## 2024-01-20 DIAGNOSIS — I480 Paroxysmal atrial fibrillation: Secondary | ICD-10-CM

## 2024-01-20 DIAGNOSIS — I10 Essential (primary) hypertension: Secondary | ICD-10-CM

## 2024-01-20 DIAGNOSIS — E7849 Other hyperlipidemia: Secondary | ICD-10-CM

## 2024-01-20 NOTE — Patient Instructions (Signed)
 Medication Instructions:  No changes *If you need a refill on your cardiac medications before your next appointment, please call your pharmacy*  Lab Work: TSH - do this same day as echocardiogram  If you have labs (blood work) drawn today and your tests are completely normal, you will receive your results only by: MyChart Message (if you have MyChart) OR A paper copy in the mail If you have any lab test that is abnormal or we need to change your treatment, we will call you to review the results.  Testing/Procedures: Your physician has requested that you have an echocardiogram. Echocardiography is a painless test that uses sound waves to create images of your heart. It provides your doctor with information about the size and shape of your heart and how well your heart's chambers and valves are working. This procedure takes approximately one hour. There are no restrictions for this procedure. Please do NOT wear cologne, perfume, aftershave, or lotions (deodorant is allowed). Please arrive 15 minutes prior to your appointment time.  Please note: We ask at that you not bring children with you during ultrasound (echo/ vascular) testing. Due to room size and safety concerns, children are not allowed in the ultrasound rooms during exams. Our front office staff cannot provide observation of children in our lobby area while testing is being conducted. An adult accompanying a patient to their appointment will only be allowed in the ultrasound room at the discretion of the ultrasound technician under special circumstances. We apologize for any inconvenience.   Follow-Up: At [facility], you and your health needs are our priority.  As part of our continuing mission to provide you with exceptional heart care, our providers are all part of one team.  This team includes your primary Cardiologist (physician) and Advanced Practice Providers or APPs (Physician Assistants and Nurse Practitioners) who all  work together to provide you with the care you need, when you need it.  Your next appointment:   6 month(s)  Provider:   Shelda Bruckner, MD, Rosaline Bane, NP, or Reche Finder, NP

## 2024-01-20 NOTE — Progress Notes (Signed)
 Cardiology Office Note:  .    Date:  01/20/2024  ID:  Debby Manners, DOB June 03, 1945, MRN [id number] PCP: Kennyth Worth HERO, MD   HeartCare Providers Cardiologist:  Shelda Bruckner, MD     History of Present Illness: .    Steven Shepard is a 78 y.o. male with a hx of paroxysmal atrial fibrillation, hemolytic anemia, aortic stenosis, cold agglutinin disease, COPD, hyperlipidemia, hypertension, OSA on CPAP, and pre-diabetes, who is seen for follow-up. He was previously a patient of Dr. Maranda and Olivia Pavy.   Family history: His father (33 yo) and mother (86 yo) died of heart attacks. Prior cardiac testing and/or incidental findings on other testing (ie coronary calcium ): Heart Cath (20-30 yrs ago), no stents   Repeat echo completed 09/2022 revealing LVEF 60-65%, mild LVH, indeterminate diastolic parameters. Left and right atrial size mildly dilated. Trivial mitral regurgitation. Moderate to severe aortic valve stenosis. Compared to prior TTE in 2022, the mean AoV gradient is now (previously ) with Vmax 3.59m/s (previously 3.13m/s). There is mild dilatation of the aortic root, measuring 40 mm. There is mild dilatation of the ascending aorta, measuring 41 mm. He was also noted to be in Afib.     Today: Reviewed his echo again, suggesting possible severe aortic stenosis. Does chair yoga, walks, does yardwork. No chest pain or syncope. Mild shortness of breath walking up a hill, doesn't need to stop. This has been unchanged for a while. We reviewed aortic stenosis, workup, treatment, see below.  Has lost about 30 lbs with diet changes based on his home scale.   Hasn't felt afib recently, has been <2% on his smartwatch.  ROS:  Denies chest pain, shortness of breath at rest. No PND, orthopnea, or unexpected weight gain. No syncope or palpitations. ROS otherwise negative except as noted.   Studies Reviewed: SABRA    EKG Interpretation Date/Time:  Monday January 20 2024 09:24:56 EST Ventricular Rate:  52 PR Interval:  248 QRS Duration:  106 QT Interval:  442 QTC Calculation: 411 R Axis:   -39  Text Interpretation: Sinus bradycardia with 1st degree A-V block Left axis deviation Minimal voltage criteria for LVH, may be normal variant When compared with ECG of 03-Dec-2022 09:25, No significant change was found Confirmed by Bruckner Shelda [phone]) on 01/20/2024 9:57:07 AM    Physical Exam:    VS:  BP 118/60   Pulse (!) 57   Ht 6' 1 (1.854 m)   Wt 206 lb 3.2 oz (93.5 kg)   SpO2 96%   BMI 27.20 kg/m    Wt Readings from Last 3 Encounters:  01/20/24 206 lb 3.2 oz (93.5 kg)  10/03/23 209 lb (94.8 kg)  08/29/23 216 lb (98 kg)    GEN: Well nourished, well developed in no acute distress HEENT: Normal, moist mucous membranes NECK: No JVD CARDIAC: regular rhythm, normal S1 and S2, no rubs or gallops. 3/6 mid peaking systolic murmur with quiet but audible S2. VASCULAR: Radial and DP pulses 2+ bilaterally. No carotid bruits RESPIRATORY:  Clear to auscultation without rales, wheezing or rhonchi  ABDOMEN: Soft, non-tender, non-distended MUSCULOSKELETAL:  Ambulates independently SKIN: Warm and dry, mild nonpitting LE edema L>R NEUROLOGIC:  Alert and oriented x 3. No focal neuro deficits noted. PSYCHIATRIC:  Normal affect    ASSESSMENT AND PLAN: .    Paroxysmal atrial fibrillation: -CHA2DS2/VAS Stroke Risk Points=4  -tolerating rivaroxaban  -remains in SR, likely held post cardioversion 2/2 amiodarone  (did not hold initially on no  antiarrhythmic) -counseled on monitoring for bleeding -doing well on amiodarone , liver checked 08/2023 and pending 02/2024 with cancer center, will check TSH for monitoring of amiodarone  -continue CPAP for OSA   Moderate to severe aortic stenosis Mild dilation of ascending aorta Elevated ASCVD risk -continue atorvastatin  -echo 08/2023 with EF 70-75%, visually did not appear severe but gradient/velocities  elevated, question if this is non-valvular stenosis -no limiting symptoms -repeat echo next month (6 mos from prior) -discussed workup for AVR, TAVR vs. Surgical, CT and cath, etc -if repeat echo does suggest significant aortic stenosis, will reach out to structural team   Hypertension: -at goal today -continue amlodipine  10 mg daily, carvedilol  12.5 mg daily, furosemide  40 mg daily -reports he cannot take ACEi/MRA due to hyperkalemia, but he is currently on a potassium supplement with his furosemide . Could reassess cautiously in the future if needed   Cardiac risk counseling and prevention recommendations: -recommend heart healthy/Mediterranean diet, with whole grains, fruits, vegetable, fish, lean meats, nuts, and olive oil. Limit salt. -recommend moderate walking, 3-5 times/week for 30-50 minutes each session. Aim for at least 150 minutes.week. Goal should be pace of 3 miles/hours, or walking 1.5 miles in 30 minutes -recommend avoidance of tobacco products. Avoid excess alcohol.  Dispo: Follow-up in 6 mos, or sooner as needed. If repeat echo does suggest that valve is severely stenosed, refer to structural clinic  Signed, Shelda Bruckner, MD, PhD, [facility]  [facility]  [place]  [facility] at [facility] at [facility] [address] [place], [place] [address] [phone]

## 2024-01-22 ENCOUNTER — Encounter: Payer: Self-pay | Admitting: Family Medicine

## 2024-01-22 ENCOUNTER — Other Ambulatory Visit: Payer: Self-pay | Admitting: Family Medicine

## 2024-01-22 ENCOUNTER — Encounter (HOSPITAL_BASED_OUTPATIENT_CLINIC_OR_DEPARTMENT_OTHER): Payer: Self-pay

## 2024-01-22 DIAGNOSIS — I5031 Acute diastolic (congestive) heart failure: Secondary | ICD-10-CM

## 2024-01-22 DIAGNOSIS — I480 Paroxysmal atrial fibrillation: Secondary | ICD-10-CM

## 2024-01-22 MED ORDER — AMIODARONE HCL 200 MG PO TABS: 200.0000 mg | ORAL_TABLET | Freq: Every day | ORAL | 3 refills | Status: AC

## 2024-01-22 NOTE — Telephone Encounter (Signed)
 Appt schedule

## 2024-01-23 ENCOUNTER — Ambulatory Visit (INDEPENDENT_AMBULATORY_CARE_PROVIDER_SITE_OTHER): Admitting: Family Medicine

## 2024-01-23 ENCOUNTER — Encounter: Payer: Self-pay | Admitting: Family Medicine

## 2024-01-23 VITALS — BP 122/60 | HR 54 | Temp 97.9°F | Ht 73.0 in | Wt 205.0 lb

## 2024-01-23 DIAGNOSIS — I5031 Acute diastolic (congestive) heart failure: Secondary | ICD-10-CM

## 2024-01-23 DIAGNOSIS — E782 Mixed hyperlipidemia: Secondary | ICD-10-CM

## 2024-01-23 DIAGNOSIS — J449 Chronic obstructive pulmonary disease, unspecified: Secondary | ICD-10-CM

## 2024-01-23 DIAGNOSIS — I10 Essential (primary) hypertension: Secondary | ICD-10-CM

## 2024-01-23 DIAGNOSIS — R7303 Prediabetes: Secondary | ICD-10-CM

## 2024-01-23 DIAGNOSIS — I110 Hypertensive heart disease with heart failure: Secondary | ICD-10-CM

## 2024-01-23 DIAGNOSIS — I5032 Chronic diastolic (congestive) heart failure: Secondary | ICD-10-CM | POA: Diagnosis not present

## 2024-01-23 DIAGNOSIS — N400 Enlarged prostate without lower urinary tract symptoms: Secondary | ICD-10-CM

## 2024-01-23 DIAGNOSIS — I350 Nonrheumatic aortic (valve) stenosis: Secondary | ICD-10-CM

## 2024-01-23 DIAGNOSIS — I480 Paroxysmal atrial fibrillation: Secondary | ICD-10-CM | POA: Diagnosis not present

## 2024-01-23 LAB — COMPREHENSIVE METABOLIC PANEL WITH GFR
ALT: 20 U/L (ref 0–53)
AST: 17 U/L (ref 0–37)
Albumin: 3.9 g/dL (ref 3.5–5.2)
Alkaline Phosphatase: 61 U/L (ref 39–117)
BUN: 16 mg/dL (ref 6–23)
CO2: 32 meq/L (ref 19–32)
Calcium: 8.6 mg/dL (ref 8.4–10.5)
Chloride: 103 meq/L (ref 96–112)
Creatinine, Ser: 0.94 mg/dL (ref 0.40–1.50)
GFR: 78.5 mL/min (ref >60.00)
Glucose, Bld: 116 mg/dL — ABNORMAL HIGH (ref 70–99)
Potassium: 3.9 meq/L (ref 3.5–5.1)
Sodium: 141 meq/L (ref 135–145)
Total Bilirubin: 1.9 mg/dL — ABNORMAL HIGH (ref 0.2–1.2)
Total Protein: 6 g/dL (ref 6.0–8.3)

## 2024-01-23 LAB — LIPID PANEL
Cholesterol: 117 mg/dL (ref 0–200)
HDL: 48 mg/dL (ref >39.00)
LDL Cholesterol: 55 mg/dL (ref 0–99)
NonHDL: 69.4
Total CHOL/HDL Ratio: 2
Triglycerides: 70 mg/dL (ref 0.0–149.0)
VLDL: 14 mg/dL (ref 0.0–40.0)

## 2024-01-23 LAB — PSA: PSA: 1.04 ng/mL (ref 0.10–4.00)

## 2024-01-23 LAB — TSH: TSH: 4.97 u[IU]/mL (ref 0.35–5.50)

## 2024-01-23 MED ORDER — FUROSEMIDE 40 MG PO TABS: ORAL_TABLET | ORAL | 4 refills | Status: AC

## 2024-01-23 NOTE — Assessment & Plan Note (Signed)
 On Xarelto , Coreg , and amiodarone  per cardiology.

## 2024-01-23 NOTE — Assessment & Plan Note (Signed)
 Follows with cardiology.  Most recent echo was concerning for severe aortic stenosis.  Has repeat echocardiogram next month.

## 2024-01-23 NOTE — Assessment & Plan Note (Signed)
 Blood pressure goal today on amlodipine  10 mg daily and Coreg  12.5 mg twice daily.

## 2024-01-23 NOTE — Progress Notes (Signed)
   Steven Shepard is a 78 y.o. male who presents today for an office visit.  Assessment/Plan:  Chronic Problems Addressed Today: Prediabetes Check A1c with labs.  He would like to try off metformin .  Will take this off medication list today.  Recheck A1c in 3 to 6 months depending on today's result.  Hyperlipidemia On Lipitor 20 mg daily.  Check labs today.  Essential hypertension Blood pressure goal today on amlodipine  10 mg daily and Coreg  12.5 mg twice daily.  BPH (benign prostatic hyperplasia) On Flomax  0.4 mg daily.  Check PSA.  Paroxysmal atrial fibrillation (HCC) On Xarelto , Coreg , and amiodarone  per cardiology.  Chronic obstructive pulmonary disease (HCC) Continue management per pulmonology.  Symptoms are overall stable.  Moderate aortic stenosis Follows with cardiology.  Most recent echo was concerning for severe aortic stenosis.  Has repeat echocardiogram next month.     Subjective:  HPI:  See assessment / plan for status of chronic conditions.   Discussed the use of AI scribe software for clinical note transcription with the patient, who gave verbal consent to proceed.  History of Present Illness Steven Shepard is a 78 year old male who presents for medication refills and routine follow-up.  He has not been seen in over a year and a half and has no new issues since his last visit. He is currently taking several medications, including furosemide , which he had difficulty refilling due to not having been seen in a year. He is also on metformin , which he questions the necessity of, as he does not have diabetes but was previously noted to have borderline elevated A1c levels. He prefers to stop metformin  and monitor his sugar levels. Other medications include carvedilol , Xarelto , amlodipine , atorvastatin , amiodarone , tamsulosin , and potassium supplements.  He reports regular urination and uses tamsulosin  for prostate health. He also takes furosemide  to manage  fluid retention and uses compression socks to help with swelling, which he states is not bothersome.  He had an echocardiogram in July and is under monitoring for his aortic valve. He is under monitoring for this condition.  He is up to date on his vaccines and is due for blood work, including checks for thyroid  function, A1c, cholesterol, blood counts, kidney function, and PSA.         Objective:  Physical Exam: BP 122/60   Pulse (!) 54   Temp 97.9 F (36.6 C) (Temporal)   Ht 6' 1 (1.854 m)   Wt 205 lb (93 kg)   SpO2 96%   BMI 27.05 kg/m   Gen: No acute distress, resting comfortably CV: Regular rate and rhythm with 3/6 systolic murmur appreciated Pulm: Normal work of breathing, clear to auscultation bilaterally with no crackles, wheezes, or rhonchi Neuro: Grossly normal, moves all extremities Psych: Normal affect and thought content      Steven Schuenemann M. Kennyth, MD 01/23/2024 11:48 AM

## 2024-01-23 NOTE — Assessment & Plan Note (Signed)
On Lipitor 20 mg daily.  Check labs today.

## 2024-01-23 NOTE — Assessment & Plan Note (Signed)
On Flomax 0.4 mg daily.  Check PSA. 

## 2024-01-23 NOTE — Assessment & Plan Note (Signed)
 Check A1c with labs.  He would like to try off metformin .  Will take this off medication list today.  Recheck A1c in 3 to 6 months depending on today's result.

## 2024-01-23 NOTE — Assessment & Plan Note (Signed)
 Continue management per pulmonology.  Symptoms are overall stable.

## 2024-01-23 NOTE — Patient Instructions (Signed)
 It was very nice to see you today!  VISIT SUMMARY: Today, you came in for medication refills and a routine follow-up. We discussed your current medications and made some adjustments. We also ordered some blood tests to monitor your health and scheduled a follow-up appointment for next year.  YOUR PLAN: ESSENTIAL HYPERTENSION: Your blood pressure is being managed with your current medications. -Continue taking your current blood pressure medications.  MIXED HYPERLIPIDEMIA: Your cholesterol levels need to be checked as it has been over a year since your last test. -A lipid panel has been ordered to check your current cholesterol levels.  PAROXYSMAL ATRIAL FIBRILLATION: Your atrial fibrillation is being managed with your current medications. -Continue taking your current antiarrhythmic and anticoagulation medications.  CHRONIC DIASTOLIC HEART FAILURE: You have chronic diastolic heart failure with no recent exacerbations. -Continue with your current management plan.  PREDIABETES: You have prediabetes and were previously taking metformin . -We have discontinued metformin  as per your preference. -An A1c test has been ordered to monitor your blood sugar levels.  BENIGN PROSTATIC HYPERPLASIA WITH LOWER URINARY TRACT SYMPTOMS: You have BPH, which is being managed with tamsulosin . -Continue taking tamsulosin  for BPH management.  GENERAL HEALTH MAINTENANCE: You are up to date on your vaccinations and need routine blood work. -Comprehensive blood work has been ordered, including tests for thyroid  function, A1c, cholesterol, blood counts, kidney function, and PSA. -Schedule a follow-up appointment in one year.  Return in about 1 year (around 01/22/2025).   Take care, Dr Kennyth  PLEASE NOTE:  If you had any lab tests, please let us  know if you have not heard back within a few days. You may see your results on mychart before we have a chance to review them but we will give you a call once they  are reviewed by us .   If we ordered any referrals today, please let us  know if you have not heard from their office within the next week.   If you had any urgent prescriptions sent in today, please check with the pharmacy within an hour of our visit to make sure the prescription was transmitted appropriately.   Please try these tips to maintain a healthy lifestyle:  Eat at least 3 REAL meals and 1-2 snacks per day.  Aim for no more than 5 hours between eating.  If you eat breakfast, please do so within one hour of getting up.   Each meal should contain half fruits/vegetables, one quarter protein, and one quarter carbs (no bigger than a computer mouse)  Cut down on sweet beverages. This includes juice, soda, and sweet tea.   Drink at least 1 glass of water  with each meal and aim for at least 8 glasses per day  Exercise at least 150 minutes every week.    Preventive Care 50 Years and Older, Male Preventive care refers to lifestyle choices and visits with your health care provider that can promote health and wellness. Preventive care visits are also called wellness exams. What can I expect for my preventive care visit? Counseling During your preventive care visit, your health care provider may ask about your: Medical history, including: Past medical problems. Family medical history. History of falls. Current health, including: Emotional well-being. Home life and relationship well-being. Sexual activity. Memory and ability to understand (cognition). Lifestyle, including: Alcohol, nicotine or tobacco, and drug use. Access to firearms. Diet, exercise, and sleep habits. Work and work astronomer. Sunscreen use. Safety issues such as seatbelt and bike helmet use. Physical exam Your  health care provider will check your: Height and weight. These may be used to calculate your BMI (body mass index). BMI is a measurement that tells if you are at a healthy weight. Waist circumference.  This measures the distance around your waistline. This measurement also tells if you are at a healthy weight and may help predict your risk of certain diseases, such as type 2 diabetes and high blood pressure. Heart rate and blood pressure. Body temperature. Skin for abnormal spots. What immunizations do I need?  Vaccines are usually given at various ages, according to a schedule. Your health care provider will recommend vaccines for you based on your age, medical history, and lifestyle or other factors, such as travel or where you work. What tests do I need? Screening Your health care provider may recommend screening tests for certain conditions. This may include: Lipid and cholesterol levels. Diabetes screening. This is done by checking your blood sugar (glucose) after you have not eaten for a while (fasting). Hepatitis C test. Hepatitis B test. HIV (human immunodeficiency virus) test. STI (sexually transmitted infection) testing, if you are at risk. Lung cancer screening. Colorectal cancer screening. Prostate cancer screening. Abdominal aortic aneurysm (AAA) screening. You may need this if you are a current or former smoker. Talk with your health care provider about your test results, treatment options, and if necessary, the need for more tests. Follow these instructions at home: Eating and drinking  Eat a diet that includes fresh fruits and vegetables, whole grains, lean protein, and low-fat dairy products. Limit your intake of foods with high amounts of sugar, saturated fats, and salt. Take vitamin and mineral supplements as recommended by your health care provider. Do not drink alcohol if your health care provider tells you not to drink. If you drink alcohol: Limit how much you have to 0-2 drinks a day. Know how much alcohol is in your drink. In the [place] one drink equals one 12 oz bottle of beer (355 mL), one 5 oz glass of wine (148 mL), or one 1 oz glass of hard liquor (44  mL). Lifestyle Brush your teeth every morning and night with fluoride toothpaste. Floss one time each day. Exercise for at least 30 minutes 5 or more days each week. Do not use any products that contain nicotine or tobacco. These products include cigarettes, chewing tobacco, and vaping devices, such as e-cigarettes. If you need help quitting, ask your health care provider. Do not use drugs. If you are sexually active, practice safe sex. Use a condom or other form of protection to prevent STIs. Take aspirin only as told by your health care provider. Make sure that you understand how much to take and what form to take. Work with your health care provider to find out whether it is safe and beneficial for you to take aspirin daily. Ask your health care provider if you need to take a cholesterol-lowering medicine (statin). Find healthy ways to manage stress, such as: Meditation, yoga, or listening to music. Journaling. Talking to a trusted person. Spending time with friends and family. Safety Always wear your seat belt while driving or riding in a vehicle. Do not drive: If you have been drinking alcohol. Do not ride with someone who has been drinking. When you are tired or distracted. While texting. If you have been using any mind-altering substances or drugs. Wear a helmet and other protective equipment during sports activities. If you have firearms in your house, make sure you follow all gun safety  procedures. Minimize exposure to UV radiation to reduce your risk of skin cancer. What's next? Visit your health care provider once a year for an annual wellness visit. Ask your health care provider how often you should have your eyes and teeth checked. Stay up to date on all vaccines. This information is not intended to replace advice given to you by your health care provider. Make sure you discuss any questions you have with your health care provider. Document Revised: 08/24/2020 Document  Reviewed: 08/24/2020 Elsevier Patient Education  2024 [employer].

## 2024-01-24 ENCOUNTER — Telehealth: Payer: Self-pay | Admitting: *Deleted

## 2024-01-24 NOTE — Telephone Encounter (Signed)
 Darice from [facility] stated unable to process A!C And CBC due to needing special blood collection (tubes need to be warm prior to collection) Patient need  special collection due to [place]  need to go to [facility].

## 2024-01-27 NOTE — Telephone Encounter (Signed)
 Please let patient know and place new orders if needed.  Worth HERO. Kennyth, MD 01/27/2024 8:31 AM

## 2024-01-29 ENCOUNTER — Ambulatory Visit: Admitting: Family Medicine

## 2024-01-29 ENCOUNTER — Other Ambulatory Visit: Payer: Self-pay | Admitting: *Deleted

## 2024-01-29 DIAGNOSIS — R7303 Prediabetes: Secondary | ICD-10-CM

## 2024-01-29 DIAGNOSIS — I10 Essential (primary) hypertension: Secondary | ICD-10-CM

## 2024-01-29 NOTE — Telephone Encounter (Signed)
 Order placed for [facility]  Patient aware  Call [facility] at [phone] for appt  Request placed at front office

## 2024-01-30 ENCOUNTER — Ambulatory Visit: Payer: Self-pay | Admitting: Family Medicine

## 2024-01-30 ENCOUNTER — Other Ambulatory Visit: Payer: Self-pay | Admitting: Family Medicine

## 2024-01-30 DIAGNOSIS — R7303 Prediabetes: Secondary | ICD-10-CM | POA: Diagnosis not present

## 2024-01-30 DIAGNOSIS — I10 Essential (primary) hypertension: Secondary | ICD-10-CM | POA: Diagnosis not present

## 2024-01-30 NOTE — Progress Notes (Signed)
 We are still waiting on his A1c and blood count labs to come back however the rest of his labs are all stable.  Do not need to make any changes to treatment plan at this time.  We will contact him once we get results back on his other labs.

## 2024-01-31 ENCOUNTER — Ambulatory Visit: Payer: Self-pay | Admitting: Family Medicine

## 2024-01-31 LAB — CBC WITH DIFFERENTIAL/PLATELET
Absolute Lymphocytes: 1097 {cells}/uL (ref 850–3900)
Absolute Monocytes: 317 {cells}/uL (ref 200–950)
Basophils Absolute: 21 {cells}/uL (ref 0–200)
Basophils Relative: 0.4 %
Eosinophils Absolute: 31 {cells}/uL (ref 15–500)
Eosinophils Relative: 0.6 %
HCT: 35.5 % — ABNORMAL LOW (ref 38.5–50.0)
Hemoglobin: 11.6 g/dL — ABNORMAL LOW (ref 13.2–17.1)
MCH: 31.1 pg (ref 27.0–33.0)
MCHC: 32.7 g/dL (ref 32.0–36.0)
MCV: 95.2 fL (ref 80.0–100.0)
MPV: 10.3 fL (ref 7.5–12.5)
Monocytes Relative: 6.1 %
Neutro Abs: 3734 {cells}/uL (ref 1500–7800)
Neutrophils Relative %: 71.8 %
Platelets: 194 Thousand/uL (ref 140–400)
RBC: 3.73 Million/uL — ABNORMAL LOW (ref 4.20–5.80)
RDW: 12.5 % (ref 11.0–15.0)
Total Lymphocyte: 21.1 %
WBC: 5.2 Thousand/uL (ref 3.8–10.8)

## 2024-01-31 LAB — HEMOGLOBIN A1C: Hgb A1c MFr Bld: 5 % (ref <5.7)

## 2024-01-31 NOTE — Telephone Encounter (Signed)
 Yes he can discontinue and we can recheck in 3-6 months.

## 2024-01-31 NOTE — Progress Notes (Signed)
 His A1c is much improved to 5.0.  His hemoglobin is 11.6 which is where he was 5 months ago.  Do not need to make any changes to his treatment plan at this time.  He should continue to work on diet and exercise and we can recheck at his next visit here.

## 2024-01-31 NOTE — Telephone Encounter (Signed)
 Read by Debby Eulalio Rear at 7:48AM on 01/31/2024.  Please advise on if patient can stop metformin  or continue the treatment as advised.

## 2024-01-31 NOTE — Telephone Encounter (Signed)
 Read by Debby Eulalio Rear at 2:49PM on 01/31/2024.

## 2024-02-04 ENCOUNTER — Ambulatory Visit (INDEPENDENT_AMBULATORY_CARE_PROVIDER_SITE_OTHER): Admitting: Family Medicine

## 2024-02-04 ENCOUNTER — Encounter: Payer: Self-pay | Admitting: Family Medicine

## 2024-02-04 VITALS — BP 124/62 | HR 58 | Temp 98.6°F | Resp 14 | Ht 73.0 in | Wt 199.2 lb

## 2024-02-04 DIAGNOSIS — I10 Essential (primary) hypertension: Secondary | ICD-10-CM

## 2024-02-04 DIAGNOSIS — R059 Cough, unspecified: Secondary | ICD-10-CM | POA: Diagnosis not present

## 2024-02-04 DIAGNOSIS — R7303 Prediabetes: Secondary | ICD-10-CM | POA: Diagnosis not present

## 2024-02-04 DIAGNOSIS — M0600 Rheumatoid arthritis without rheumatoid factor, unspecified site: Secondary | ICD-10-CM | POA: Diagnosis not present

## 2024-02-04 DIAGNOSIS — R5383 Other fatigue: Secondary | ICD-10-CM | POA: Insufficient documentation

## 2024-02-04 DIAGNOSIS — I4811 Longstanding persistent atrial fibrillation: Secondary | ICD-10-CM | POA: Diagnosis not present

## 2024-02-04 DIAGNOSIS — J449 Chronic obstructive pulmonary disease, unspecified: Secondary | ICD-10-CM | POA: Diagnosis not present

## 2024-02-04 DIAGNOSIS — M064 Inflammatory polyarthropathy: Secondary | ICD-10-CM | POA: Insufficient documentation

## 2024-02-04 DIAGNOSIS — I480 Paroxysmal atrial fibrillation: Secondary | ICD-10-CM | POA: Diagnosis not present

## 2024-02-04 DIAGNOSIS — D049 Carcinoma in situ of skin, unspecified: Secondary | ICD-10-CM | POA: Diagnosis not present

## 2024-02-04 DIAGNOSIS — D5912 Cold autoimmune hemolytic anemia: Secondary | ICD-10-CM | POA: Diagnosis not present

## 2024-02-04 DIAGNOSIS — M2550 Pain in unspecified joint: Secondary | ICD-10-CM | POA: Insufficient documentation

## 2024-02-04 DIAGNOSIS — E7800 Pure hypercholesterolemia, unspecified: Secondary | ICD-10-CM | POA: Diagnosis not present

## 2024-02-04 LAB — POC COVID19 BINAXNOW: SARS Coronavirus 2 Ag: POSITIVE — AB

## 2024-02-04 LAB — POCT RAPID STREP A (OFFICE): Rapid Strep A Screen: NEGATIVE — AB

## 2024-02-04 MED ORDER — ALBUTEROL SULFATE HFA 108 (90 BASE) MCG/ACT IN AERS: 2.0000 | INHALATION_SPRAY | Freq: Four times a day (QID) | RESPIRATORY_TRACT | 0 refills | Status: DC | PRN

## 2024-02-04 MED ORDER — DOXYCYCLINE HYCLATE 100 MG PO TABS: 100.0000 mg | ORAL_TABLET | Freq: Two times a day (BID) | ORAL | 0 refills | Status: DC

## 2024-02-04 MED ORDER — PREDNISONE 20 MG PO TABS: 20.0000 mg | ORAL_TABLET | Freq: Every day | ORAL | 0 refills | Status: DC

## 2024-02-04 NOTE — Patient Instructions (Addendum)
 It was very nice to see you today!  VISIT SUMMARY: You visited us  today because of a sore throat and cough. We confirmed that you have COVID-19, which is a concern given your COPD. We have provided a treatment plan to help manage your symptoms and reduce the risk of complications.  YOUR PLAN: COVID-19 INFECTION WITH COPD: You have COVID-19, and your COPD increases the risk of complications. -Take prednisone  for 5 days as prescribed. -Use the albuterol  inhaler as needed, up to 2-3 times daily. -Doxycycline  prescribed as a backup if symptoms worsen. -Monitor your symptoms and report if there is no improvement by next week or if symptoms worsen.  Return if symptoms worsen or fail to improve.   Take care, Dr Kennyth  PLEASE NOTE:  If you had any lab tests, please let us  know if you have not heard back within a few days. You may see your results on mychart before we have a chance to review them but we will give you a call once they are reviewed by us .   If we ordered any referrals today, please let us  know if you have not heard from their office within the next week.   If you had any urgent prescriptions sent in today, please check with the pharmacy within an hour of our visit to make sure the prescription was transmitted appropriately.   Please try these tips to maintain a healthy lifestyle:  Eat at least 3 REAL meals and 1-2 snacks per day.  Aim for no more than 5 hours between eating.  If you eat breakfast, please do so within one hour of getting up.   Each meal should contain half fruits/vegetables, one quarter protein, and one quarter carbs (no bigger than a computer mouse)  Cut down on sweet beverages. This includes juice, soda, and sweet tea.   Drink at least 1 glass of water  with each meal and aim for at least 8 glasses per day  Exercise at least 150 minutes every week.

## 2024-02-04 NOTE — Progress Notes (Deleted)
 Patient is in office today for a nurse visit for {NurseVisitChoices:30435}. Patient {NurseVisitDetails:30447}

## 2024-02-04 NOTE — Progress Notes (Signed)
   Steven Shepard is a 78 y.o. male who presents today for an office visit.  Assessment/Plan:  COVID No red flags though rapid COVID test is positive.  He is on day 4 of symptoms.  He is at increased risk for complications due to his medical history including COPD however we elected to defer starting paxlovid at this point due to his multiple interactions with his current medications including amiodarone  and Xarelto .  Will treat as mild COPD flare and start a small prednisone  burst.  Will also send in albuterol  inhaler as needed.  Will send in a pocket prescription for doxycycline  with instruction to not start unless symptoms fail to improve over the next 2 days or if symptoms worsen.  We discussed reasons to return to care.  Follow-up as needed.  Essential Hypertension  Blood pressure at goal today on amlodipine  10 mg daily and Coreg  12.5 mg twice daily.  COPD Follows with pulmonology for this.  We are treating his COVID as a COPD flare as above and will be starting prednisone  and albuterol .  Do not anticipate that this will cause any long-term issues though does increase his risk for short-term complications from COVID.     Subjective:  HPI:  See assessment / plan for status of chronic conditions.   Discussed the use of AI scribe software for clinical note transcription with the patient, who gave verbal consent to proceed.  History of Present Illness Steven Shepard is a 78 year old male with COPD who presents with a sore throat and cough.  He began experiencing a sore throat on Friday, followed by a cough and nasal drainage. The nasal drainage is not unusual for him, but the cough has persisted. He describes the cough as sporadic, triggered by a 'tickle' in his throat, and it disrupts his sleep, especially when using his CPAP machine due to congestion.  No fever or chills. His throat feels better now, aided by throat lozenges. No wheezing and no recent use of inhalers. He has  not been around anyone known to be sick, but he did visit a location for blood work where he suspects he might have been exposed to illness.  He has a history of COPD. He is not currently using any inhalers and has not been on prednisone  recently. He is off metformin , as discussed in a previous visit, and his blood sugar levels have been stable.         Objective:  Physical Exam: BP 124/62   Pulse (!) 58   Temp 98.6 F (37 C) (Temporal)   Resp 14   Ht 6' 1 (1.854 m)   Wt 199 lb 3.2 oz (90.4 kg)   SpO2 97%   BMI 26.28 kg/m   Gen: No acute distress, resting comfortably CV: Regular rate and rhythm with 3/6 systolic murmurs appreciated Pulm: Normal work of breathing, clear to auscultation bilaterally with no crackles, wheezes, or rhonchi Neuro: Grossly normal, moves all extremities Psych: Normal affect and thought content      Oscar Hank M. Kennyth, MD 02/04/2024 8:29 AM

## 2024-02-11 ENCOUNTER — Other Ambulatory Visit: Payer: Self-pay | Admitting: Family Medicine

## 2024-02-12 ENCOUNTER — Other Ambulatory Visit: Payer: Self-pay | Admitting: Family Medicine

## 2024-02-12 ENCOUNTER — Encounter: Payer: Self-pay | Admitting: Family Medicine

## 2024-02-12 MED ORDER — TAMSULOSIN HCL 0.4 MG PO CAPS: 0.4000 mg | ORAL_CAPSULE | Freq: Every morning | ORAL | 1 refills | Status: DC

## 2024-02-12 NOTE — Telephone Encounter (Signed)
 Patients Tamsulosin  was refilled. Patient notified via MyChart.

## 2024-02-17 DIAGNOSIS — M25511 Pain in right shoulder: Secondary | ICD-10-CM | POA: Diagnosis not present

## 2024-02-17 DIAGNOSIS — I4811 Longstanding persistent atrial fibrillation: Secondary | ICD-10-CM | POA: Diagnosis not present

## 2024-02-17 DIAGNOSIS — Z0000 Encounter for general adult medical examination without abnormal findings: Secondary | ICD-10-CM | POA: Diagnosis not present

## 2024-02-17 DIAGNOSIS — G4733 Obstructive sleep apnea (adult) (pediatric): Secondary | ICD-10-CM | POA: Diagnosis not present

## 2024-02-17 DIAGNOSIS — I10 Essential (primary) hypertension: Secondary | ICD-10-CM | POA: Diagnosis not present

## 2024-02-24 ENCOUNTER — Other Ambulatory Visit (INDEPENDENT_AMBULATORY_CARE_PROVIDER_SITE_OTHER)

## 2024-02-24 ENCOUNTER — Ambulatory Visit (HOSPITAL_BASED_OUTPATIENT_CLINIC_OR_DEPARTMENT_OTHER): Payer: Self-pay | Admitting: Cardiology

## 2024-02-24 DIAGNOSIS — I350 Nonrheumatic aortic (valve) stenosis: Secondary | ICD-10-CM | POA: Diagnosis not present

## 2024-02-24 DIAGNOSIS — I480 Paroxysmal atrial fibrillation: Secondary | ICD-10-CM | POA: Diagnosis not present

## 2024-02-24 DIAGNOSIS — Z79899 Other long term (current) drug therapy: Secondary | ICD-10-CM | POA: Diagnosis not present

## 2024-02-24 LAB — ECHOCARDIOGRAM COMPLETE
AR max vel: 0.69 cm2
AV Area VTI: 0.68 cm2
AV Area mean vel: 0.59 cm2
AV Mean grad: 52 mmHg
AV Peak grad: 81 mmHg
AV Vena cont: 0.4 cm
Ao pk vel: 4.5 m/s
Area-P 1/2: 3.6 cm2
Radius: 0.7 cm
S' Lateral: 2.6 cm

## 2024-02-25 LAB — TSH: TSH: 7.85 u[IU]/mL — ABNORMAL HIGH (ref 0.450–4.500)

## 2024-02-25 NOTE — Progress Notes (Unsigned)
 Patient ID: Steven Shepard MRN: [id number] DOB/AGE: 04/09/1945 78 y.o.  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 The patient is a 78 year old male with above listed medical problems referred for recommendations regarding his aortic valvular disease.  He was seen by Dr. Bari for in November.  At that point in time he reported mild shortness of breath specially when walking up hills.  An echocardiogram was done which demonstrated critical aortic stenosis.  For this reason he was referred for further recommendations  He has been experiencing shortness of breath on exertion, particularly when climbing stairs, for about a month. He describes getting out of breath 'a little bit' during these activities. He experiences occasional fluttering sensations in the chest but denies chest discomfort, lightheadedness, dizziness, or orthopnea.  He has a history of atrial fibrillation and is currently on Xarelto  and amiodarone . He also takes medication for hypertension and hyperlipidemia. He uses a CPAP machine for sleep apnea.  He reports chronic leg edema, for which he wears compression socks. He denies varicose veins. He has noticed occasional hematochezia, which he attributes to possible hemorrhoids, but this is infrequent.  He has been aware of his heart murmur for several years, with monitoring by Dr. Lonni for over a year. He  acknowledges a decrease in his physical capabilities over the past few years, noting increased fatigue and reduced ability to play golf, now limited to nine holes.       Past Medical History:  Diagnosis Date   Anemia, hemolytic 01/22/2011   Aortic stenosis    Arthritis    Cancer (HCC)    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HCC) 01/23/2011   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HCC) 03/23/2020   COPD (chronic obstructive pulmonary disease) (HCC)    DOE (dyspnea on exertion)    Dysrhythmia    Elevated bilirubin    Fatty liver    Heart murmur    History of neck surgery    Hyperlipidemia    Hypertension    Mild dilation of ascending aorta    OSA on CPAP    PAF (paroxysmal atrial fibrillation) (HCC)    Pre-diabetes    Umbilical hernia     Past Surgical History:  Procedure Laterality Date   ADENOIDECTOMY     CARDIOVERSION N/A 11/07/2022   Procedure: CARDIOVERSION;  Surgeon: Lonni Slain, MD;  Location: [facility] INVASIVE CV LAB;  Service: Cardiovascular;  Laterality: N/A;   CARDIOVERSION N/A 11/27/2022   Procedure: CARDIOVERSION;  Surgeon: Lonni Slain, MD;  Location: [facility] INVASIVE CV LAB;  Service: Cardiovascular;  Laterality: N/A;   HIATAL HERNIA REPAIR  2011   NECK SURGERY  2005   SKIN SURGERY     skin cancer   TONSILLECTOMY     TOTAL HIP ARTHROPLASTY Right 09/18/2010   Dr. Hiram   TOTAL HIP ARTHROPLASTY Left 03/15/2021   Procedure: TOTAL HIP ARTHROPLASTY ANTERIOR APPROACH;  Surgeon: Melodi Lerner, MD;  Location: [facility] ORS;  Service: Orthopedics;  Laterality: Left;    Family History  Problem Relation Age of Onset   Coronary artery disease Mother    Hypertension Mother    Heart attack Mother    Coronary artery disease Father    Hypertension Father    Heart attack Father    Diabetes Sister    Hypertension Sister    Diabetes Brother    Cancer Brother    Colon cancer Neg Hx    Colonic polyp Neg Hx     Social History   Socioeconomic History   Marital status: Married    Spouse  name: Not on file   Number of children: Not on file   Years of education: Not on file   Highest education level: Bachelor's degree (e.g., BA, AB, BS)  Occupational History   Occupation: Engineer, Drilling: [employer]    Comment: retired  Tobacco Use   Smoking status: Former    Current packs/day: 0.00    Average packs/day: 1.5 packs/day for 3.0 years (4.5 ttl pk-yrs)    Types: Cigarettes, Pipe    Start date: 03/12/1970    Quit date: 03/12/1973    Years since quitting: 50.9   Smokeless tobacco: Never   Tobacco comments:    quit appox 36 years ago  Vaping Use   Vaping status: Never Used  Substance and Sexual Activity   Alcohol use: Yes    Comment: occasionally, 1-7 drinks per week   Drug use: No   Sexual activity: Yes  Other Topics Concern   Not on file  Social History Narrative   Not on file   Social Drivers of Health   Tobacco Use: Medium Risk (02/26/2024)   Patient History    Smoking Tobacco Use: Former    Smokeless Tobacco Use: Never    Passive Exposure: Not on file  Financial Resource Strain: Low Risk (02/16/2024)   Received from [facility]   Overall Financial Resource Strain (CARDIA)    How hard is it for you to pay for the very basics like food, housing, medical care, and heating?: Not hard at all  Food Insecurity: No Food Insecurity (02/16/2024)   Received from [facility]   Epic    Within the past 12 months, you worried that your food would run out before you got the money to buy more.: Never true    Within the past 12 months, the food you bought just didn't last and you didn't have money to get more.: Never true  Transportation Needs: No Transportation Needs (02/16/2024)   Received from [facility]    In the past 12 months, has lack of transportation kept you from medical appointments or from getting medications?: No    In the past 12 months, has lack of transportation kept you from meetings, work, or from getting things needed for daily living?: No   Physical Activity: Sufficiently Active (02/16/2024)   Received from [facility]   Exercise Vital Sign    On average, how many days per week do you engage in moderate to strenuous exercise (like a brisk walk)?: 5 days    On average, how many minutes do you engage in exercise at this level?: 30 min  Recent Concern: Physical Activity - Insufficiently Active (01/22/2024)   Exercise Vital Sign    Days of Exercise per Week: 5 days    Minutes of Exercise per Session: 20 min  Stress: No Stress Concern Present (02/16/2024)   Received from [facility] - Occupational  Stress Questionnaire    Do you feel stress - tense, restless, nervous, or anxious, or unable to sleep at night because your mind is troubled all the time - these days?: Not at all  Social Connections: Socially Integrated (02/16/2024)   Received from [facility]   Social Network    How would you rate your social network (family, work, friends)?: Good participation with social networks  Intimate Partner Violence: Not At Risk (02/16/2024)   Received from [facility]   HITS    Over the last 12 months how often did your partner physically hurt you?: Never    Over the last 12 months how often did your partner insult you or talk down to you?: Never    Over the last 12 months how often did your partner threaten you with physical harm?: Never    Over the last 12 months how often did your partner scream or curse at you?: Never  Depression (PHQ2-9): Low Risk (02/04/2024)   Depression (PHQ2-9)    PHQ-2 Score: 0  Alcohol Screen: Low Risk (01/22/2024)   Alcohol Screen    Last Alcohol Screening Score (AUDIT): 1  Housing: Low Risk (02/16/2024)   Received from [facility]    In the last 12 months, was there a time when you were not able to pay the mortgage or rent on time?: No    In the past 12 months, how many times have you moved where you were living?: 0    At any time in the past 12  months, were you homeless or living in a shelter (including now)?: No  Utilities: Not At Risk (02/16/2024)   Received from [facility]    In the past 12 months has the electric, gas, oil, or water  company threatened to shut off services in your home?: No  Health Literacy: Adequate Health Literacy (04/23/2023)   B1300 Health Literacy    Frequency of need for help with medical instructions: Never     Prior to Admission medications  Medication Sig Start Date End Date Taking? Authorizing Provider  acetaminophen  (TYLENOL ) 650 MG CR tablet Take 650 mg by mouth 2 (two) times daily as needed for pain.    [provider]  albuterol  (VENTOLIN  HFA) 108 (90 Base) MCG/ACT inhaler Inhale 2 puffs into the lungs every 6 (six) hours as needed for wheezing or shortness of breath. 02/04/24   Kennyth Worth HERO, MD  amiodarone  (PACERONE ) 200 MG tablet Take 1 tablet (200 mg total) by mouth daily. 01/22/24 01/16/25  Lonni Slain, MD  amLODipine  (NORVASC ) 10 MG tablet TAKE 1 TABLET(10 MG) BY MOUTH DAILY 01/13/24   Kennyth Worth HERO, MD  AMOXICILLIN PO Take by mouth. Before dental work only    [employer], Historical, MD  atorvastatin  (LIPITOR) 20 MG tablet TAKE 1 TABLET(20 MG) BY MOUTH DAILY 09/30/23   Kennyth Worth HERO, MD  carvedilol  (COREG ) 12.5 MG tablet TAKE 1 TABLET(12.5 MG) BY MOUTH TWICE DAILY WITH A MEAL 09/27/23   Lonni Slain, MD  doxycycline  (VIBRA -TABS) 100 MG tablet Take 1 tablet (100 mg total) by mouth 2 (two) times daily. 02/04/24   Kennyth Worth HERO, MD  fexofenadine (ALLEGRA) 180 MG tablet Take 180 mg by mouth in the morning.    [provider]  folic acid (FOLVITE) 800 MCG tablet Take 800 mcg by mouth 2 (two) times daily.    [provider]  furosemide  (LASIX ) 40 MG tablet TAKE 1 TABLET BY MOUTH TWICE  DAILY AT 8 AM AND AT 2 PM 01/23/24   Kennyth Worth HERO, MD  Anselm Oil 500 MG CAPS Take 500 mg by mouth daily with supper.    [provider]  Misc  Natural Product Nasal (NASAL CLEANSE RINSE MIX NA) Place into the nose in the morning. NeilMed Sinus Rinse    [provider]  Multiple Vitamins-Minerals (ONE-A-DAY MENS 50+) TABS Take 1 tablet by mouth daily with breakfast.    [provider]  potassium chloride  SA (KLOR-CON  M) 20 MEQ tablet TAKE 1 TABLET(20 MEQ) BY MOUTH AT 8 AM AND AT 2 PM AS DIRECTED 09/27/23   Lonni Slain, MD  predniSONE  (DELTASONE ) 20 MG tablet Take 1 tablet (20 mg total) by mouth daily with breakfast. 02/04/24   Kennyth Worth HERO, MD  PRESCRIPTION MEDICATION Pt has CPAP Machine    [provider]  Psyllium (METAMUCIL FIBER PO) Take by mouth See admin instructions. Mix 1 well-rounded teaspoonful of powder into 4-8 ounces of water  with one Emergen-C packet and drink every morning    [provider]  tamsulosin  (FLOMAX ) 0.4 MG CAPS capsule Take 1 capsule (0.4 mg total) by mouth every morning. 02/12/24   Kennyth Worth HERO, MD  XARELTO  20 MG TABS tablet TAKE 1 TABLET(20 MG) BY MOUTH DAILY WITH SUPPER 10/10/23   Lonni Slain, MD    Allergies[1]  REVIEW OF SYSTEMS:  General: no fevers/chills/night sweats Eyes: no blurry vision, diplopia, or amaurosis ENT: no sore throat or hearing loss Resp: no cough, wheezing, or hemoptysis CV: no edema or palpitations GI: no abdominal pain, nausea, vomiting, diarrhea, or constipation GU: no dysuria, frequency, or hematuria Skin: no rash Neuro: no headache, numbness, tingling, or weakness of extremities Musculoskeletal: no joint pain or swelling Heme: no bleeding, DVT, or easy bruising Endo: no polydipsia or polyuria  BP (!) 122/58   Pulse (!) 57   Ht 6' 1 (1.854 m)   Wt 208 lb 11.2 oz (94.7 kg)   SpO2 95%   BMI 27.53 kg/m   PHYSICAL EXAM: GEN:  AO x 3 in no acute distress HEENT: normal Dentition: Normal Neck: JVP normal. +2 carotid upstrokes without bruits. No thyromegaly. Lungs: equal expansion, clear bilaterally CV: Apex  is discrete and nondisplaced, RRR with 3 out of 6 crescendo decrescendo murmur Abd: soft, non-tender, non-distended; no bruit; positive bowel sounds Ext: no edema, ecchymoses, or cyanosis Vascular: 2+ femoral pulses, 2+ radial pulses       Skin: warm and dry without rash Neuro: CN II-XII grossly intact; motor and sensory grossly intact    DATA AND STUDIES:  EKG: 2025 sinus rhythm with first-degree AV block  EKG Interpretation Date/Time:  Wednesday February 26 2024 13:22:13 EST Ventricular Rate:  57 PR Interval:  232 QRS Duration:  106 QT Interval:  442 QTC Calculation: 430 R Axis:   28  Text Interpretation: Sinus bradycardia with 1st degree A-V block with occasional Premature ventricular complexes When compared with ECG of 20-Jan-2024 09:24, Premature ventricular complexes are now Present Confirmed by Wendel Haws ([id number]) on 02/26/2024 1:32:16 PM        CARDIAC STUDIES: Refer to CV Procedures and Imaging Tabs  01/23/2024: ALT 20; BUN 16; Creatinine, Ser 0.94; Potassium 3.9; Sodium 141 01/30/2024: Hemoglobin 11.6; Platelets 194 02/24/2024: TSH 7.850   STS RISK CALCULATOR: Pending  NYHA CLASS: 2    ASSESSMENT AND PLAN:   1. Nonrheumatic aortic valve stenosis   2. Diastolic dysfunction   3. Paroxysmal atrial fibrillation (HCC)  4. Hypercoagulable state due to paroxysmal atrial fibrillation (HCC)   5. Primary hypertension   6. Hyperlipidemia LDL goal <70   7. Aortic atherosclerosis   8. CKD (chronic kidney disease) stage 2, GFR 60-89 ml/min   9. Pre-procedure lab exam     Aortic stenosis: Patient has developed NYHA II symptoms of shortness of breath.  He tells me when compared to a year ago he is not able to play golf and he would very much like to get back to doing this.  I reviewed his echocardiogram which demonstrates severely calcified valve with restricted leaflet motion.  Will refer for coronary angiography, right heart catheterization, CTA, and cardiothoracic  surgical opinion. Diastolic dysfunction: Continue Coreg  twice daily twice daily, intolerant to ACE inhibitors or spironolactone.  Consider trial of ARB and SGLT2 inhibitor. Paroxysmal atrial fibrillation: Continue Xarelto  20 mg daily, Coreg  12.5 mg twice daily Hypercoagulable state: Continue Xarelto  20 mg daily Hypertension: BP is well-controlled today.  Continue amlodipine  10 mg Hyperlipidemia: Continue atorvastatin  20 mg daily Atherosclerosis: Continue atorvastatin  20 mg daily, continue Xarelto  20 mg daily  CKD stage II: Consider ARB and SGLT2 in future given CKD and diastolic function.   I have personally reviewed the patients imaging data as summarized above.  I have reviewed the natural history of aortic stenosis with the patient and family members who are present today. We have discussed the limitations of medical therapy and the poor prognosis associated with symptomatic aortic stenosis. We have also reviewed potential treatment options, including palliative medical therapy, conventional surgical aortic valve replacement, and transcatheter aortic valve replacement. We discussed treatment options in the context of this patient's specific comorbid medical conditions.   All of the patient's questions were answered today. Will make further recommendations based on the results of studies outlined above.   I spent 45 minutes reviewing all clinical data during and prior to this visit including all relevant imaging studies, laboratories, clinical information from other health systems and prior notes from both Cardiology and other specialties, interviewing the patient, conducting a complete physical examination, and coordinating care in order to formulate a comprehensive and personalized evaluation and treatment plan.   Roxana Lai K Lawyer Washabaugh, MD  02/26/2024 2:16 PM    [facility] [address] [place], [place], [place]  [address] Phone: [phone]; Fax: [phone]        [1]   Allergies Allergen Reactions   Ace Inhibitors Other (See Comments)    High K+    Codeine Other (See Comments)    Hallucinations   Spironolactone Other (See Comments)    Increases potassium

## 2024-02-25 NOTE — H&P (View-Only) (Signed)
 Patient ID: Steven Shepard MRN: [id number] DOB/AGE: 04/09/1945 78 y.o.  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 The patient is a 78 year old male with above listed medical problems referred for recommendations regarding his aortic valvular disease.  He was seen by Dr. Bari for in November.  At that point in time he reported mild shortness of breath specially when walking up hills.  An echocardiogram was done which demonstrated critical aortic stenosis.  For this reason he was referred for further recommendations  He has been experiencing shortness of breath on exertion, particularly when climbing stairs, for about a month. He describes getting out of breath 'a little bit' during these activities. He experiences occasional fluttering sensations in the chest but denies chest discomfort, lightheadedness, dizziness, or orthopnea.  He has a history of atrial fibrillation and is currently on Xarelto  and amiodarone . He also takes medication for hypertension and hyperlipidemia. He uses a CPAP machine for sleep apnea.  He reports chronic leg edema, for which he wears compression socks. He denies varicose veins. He has noticed occasional hematochezia, which he attributes to possible hemorrhoids, but this is infrequent.  He has been aware of his heart murmur for several years, with monitoring by Dr. Lonni for over a year. He  acknowledges a decrease in his physical capabilities over the past few years, noting increased fatigue and reduced ability to play golf, now limited to nine holes.       Past Medical History:  Diagnosis Date   Anemia, hemolytic 01/22/2011   Aortic stenosis    Arthritis    Cancer (HCC)    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HCC) 01/23/2011   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HCC) 03/23/2020   COPD (chronic obstructive pulmonary disease) (HCC)    DOE (dyspnea on exertion)    Dysrhythmia    Elevated bilirubin    Fatty liver    Heart murmur    History of neck surgery    Hyperlipidemia    Hypertension    Mild dilation of ascending aorta    OSA on CPAP    PAF (paroxysmal atrial fibrillation) (HCC)    Pre-diabetes    Umbilical hernia     Past Surgical History:  Procedure Laterality Date   ADENOIDECTOMY     CARDIOVERSION N/A 11/07/2022   Procedure: CARDIOVERSION;  Surgeon: Lonni Slain, MD;  Location: [facility] INVASIVE CV LAB;  Service: Cardiovascular;  Laterality: N/A;   CARDIOVERSION N/A 11/27/2022   Procedure: CARDIOVERSION;  Surgeon: Lonni Slain, MD;  Location: [facility] INVASIVE CV LAB;  Service: Cardiovascular;  Laterality: N/A;   HIATAL HERNIA REPAIR  2011   NECK SURGERY  2005   SKIN SURGERY     skin cancer   TONSILLECTOMY     TOTAL HIP ARTHROPLASTY Right 09/18/2010   Dr. Hiram   TOTAL HIP ARTHROPLASTY Left 03/15/2021   Procedure: TOTAL HIP ARTHROPLASTY ANTERIOR APPROACH;  Surgeon: Melodi Lerner, MD;  Location: [facility] ORS;  Service: Orthopedics;  Laterality: Left;    Family History  Problem Relation Age of Onset   Coronary artery disease Mother    Hypertension Mother    Heart attack Mother    Coronary artery disease Father    Hypertension Father    Heart attack Father    Diabetes Sister    Hypertension Sister    Diabetes Brother    Cancer Brother    Colon cancer Neg Hx    Colonic polyp Neg Hx     Social History   Socioeconomic History   Marital status: Married    Spouse  name: Not on file   Number of children: Not on file   Years of education: Not on file   Highest education level: Bachelor's degree (e.g., BA, AB, BS)  Occupational History   Occupation: Engineer, Drilling: [employer]    Comment: retired  Tobacco Use   Smoking status: Former    Current packs/day: 0.00    Average packs/day: 1.5 packs/day for 3.0 years (4.5 ttl pk-yrs)    Types: Cigarettes, Pipe    Start date: 03/12/1970    Quit date: 03/12/1973    Years since quitting: 50.9   Smokeless tobacco: Never   Tobacco comments:    quit appox 36 years ago  Vaping Use   Vaping status: Never Used  Substance and Sexual Activity   Alcohol use: Yes    Comment: occasionally, 1-7 drinks per week   Drug use: No   Sexual activity: Yes  Other Topics Concern   Not on file  Social History Narrative   Not on file   Social Drivers of Health   Tobacco Use: Medium Risk (02/26/2024)   Patient History    Smoking Tobacco Use: Former    Smokeless Tobacco Use: Never    Passive Exposure: Not on file  Financial Resource Strain: Low Risk (02/16/2024)   Received from [facility]   Overall Financial Resource Strain (CARDIA)    How hard is it for you to pay for the very basics like food, housing, medical care, and heating?: Not hard at all  Food Insecurity: No Food Insecurity (02/16/2024)   Received from [facility]   Epic    Within the past 12 months, you worried that your food would run out before you got the money to buy more.: Never true    Within the past 12 months, the food you bought just didn't last and you didn't have money to get more.: Never true  Transportation Needs: No Transportation Needs (02/16/2024)   Received from [facility]    In the past 12 months, has lack of transportation kept you from medical appointments or from getting medications?: No    In the past 12 months, has lack of transportation kept you from meetings, work, or from getting things needed for daily living?: No   Physical Activity: Sufficiently Active (02/16/2024)   Received from [facility]   Exercise Vital Sign    On average, how many days per week do you engage in moderate to strenuous exercise (like a brisk walk)?: 5 days    On average, how many minutes do you engage in exercise at this level?: 30 min  Recent Concern: Physical Activity - Insufficiently Active (01/22/2024)   Exercise Vital Sign    Days of Exercise per Week: 5 days    Minutes of Exercise per Session: 20 min  Stress: No Stress Concern Present (02/16/2024)   Received from [facility] - Occupational  Stress Questionnaire    Do you feel stress - tense, restless, nervous, or anxious, or unable to sleep at night because your mind is troubled all the time - these days?: Not at all  Social Connections: Socially Integrated (02/16/2024)   Received from [facility]   Social Network    How would you rate your social network (family, work, friends)?: Good participation with social networks  Intimate Partner Violence: Not At Risk (02/16/2024)   Received from [facility]   HITS    Over the last 12 months how often did your partner physically hurt you?: Never    Over the last 12 months how often did your partner insult you or talk down to you?: Never    Over the last 12 months how often did your partner threaten you with physical harm?: Never    Over the last 12 months how often did your partner scream or curse at you?: Never  Depression (PHQ2-9): Low Risk (02/04/2024)   Depression (PHQ2-9)    PHQ-2 Score: 0  Alcohol Screen: Low Risk (01/22/2024)   Alcohol Screen    Last Alcohol Screening Score (AUDIT): 1  Housing: Low Risk (02/16/2024)   Received from [facility]    In the last 12 months, was there a time when you were not able to pay the mortgage or rent on time?: No    In the past 12 months, how many times have you moved where you were living?: 0    At any time in the past 12  months, were you homeless or living in a shelter (including now)?: No  Utilities: Not At Risk (02/16/2024)   Received from [facility]    In the past 12 months has the electric, gas, oil, or water  company threatened to shut off services in your home?: No  Health Literacy: Adequate Health Literacy (04/23/2023)   B1300 Health Literacy    Frequency of need for help with medical instructions: Never     Prior to Admission medications  Medication Sig Start Date End Date Taking? Authorizing Provider  acetaminophen  (TYLENOL ) 650 MG CR tablet Take 650 mg by mouth 2 (two) times daily as needed for pain.    [provider]  albuterol  (VENTOLIN  HFA) 108 (90 Base) MCG/ACT inhaler Inhale 2 puffs into the lungs every 6 (six) hours as needed for wheezing or shortness of breath. 02/04/24   Kennyth Worth HERO, MD  amiodarone  (PACERONE ) 200 MG tablet Take 1 tablet (200 mg total) by mouth daily. 01/22/24 01/16/25  Lonni Slain, MD  amLODipine  (NORVASC ) 10 MG tablet TAKE 1 TABLET(10 MG) BY MOUTH DAILY 01/13/24   Kennyth Worth HERO, MD  AMOXICILLIN PO Take by mouth. Before dental work only    Restaurant Manager[employer], Historical, MD  atorvastatin  (LIPITOR) 20 MG tablet TAKE 1 TABLET(20 MG) BY MOUTH DAILY 09/30/23   Kennyth Worth HERO, MD  carvedilol  (COREG ) 12.5 MG tablet TAKE 1 TABLET(12.5 MG) BY MOUTH TWICE DAILY WITH A MEAL 09/27/23   Lonni Slain, MD  doxycycline  (VIBRA -TABS) 100 MG tablet Take 1 tablet (100 mg total) by mouth 2 (two) times daily. 02/04/24   Kennyth Worth HERO, MD  fexofenadine (ALLEGRA) 180 MG tablet Take 180 mg by mouth in the morning.    [provider]  folic acid (FOLVITE) 800 MCG tablet Take 800 mcg by mouth 2 (two) times daily.    [provider]  furosemide  (LASIX ) 40 MG tablet TAKE 1 TABLET BY MOUTH TWICE  DAILY AT 8 AM AND AT 2 PM 01/23/24   Kennyth Worth HERO, MD  Anselm Oil 500 MG CAPS Take 500 mg by mouth daily with supper.    [provider]  Misc  Natural Product Nasal (NASAL CLEANSE RINSE MIX NA) Place into the nose in the morning. NeilMed Sinus Rinse    [provider]  Multiple Vitamins-Minerals (ONE-A-DAY MENS 50+) TABS Take 1 tablet by mouth daily with breakfast.    [provider]  potassium chloride  SA (KLOR-CON  M) 20 MEQ tablet TAKE 1 TABLET(20 MEQ) BY MOUTH AT 8 AM AND AT 2 PM AS DIRECTED 09/27/23   Lonni Slain, MD  predniSONE  (DELTASONE ) 20 MG tablet Take 1 tablet (20 mg total) by mouth daily with breakfast. 02/04/24   Kennyth Worth HERO, MD  PRESCRIPTION MEDICATION Pt has CPAP Machine    [provider]  Psyllium (METAMUCIL FIBER PO) Take by mouth See admin instructions. Mix 1 well-rounded teaspoonful of powder into 4-8 ounces of water  with one Emergen-C packet and drink every morning    [provider]  tamsulosin  (FLOMAX ) 0.4 MG CAPS capsule Take 1 capsule (0.4 mg total) by mouth every morning. 02/12/24   Kennyth Worth HERO, MD  XARELTO  20 MG TABS tablet TAKE 1 TABLET(20 MG) BY MOUTH DAILY WITH SUPPER 10/10/23   Lonni Slain, MD    Allergies[1]  REVIEW OF SYSTEMS:  General: no fevers/chills/night sweats Eyes: no blurry vision, diplopia, or amaurosis ENT: no sore throat or hearing loss Resp: no cough, wheezing, or hemoptysis CV: no edema or palpitations GI: no abdominal pain, nausea, vomiting, diarrhea, or constipation GU: no dysuria, frequency, or hematuria Skin: no rash Neuro: no headache, numbness, tingling, or weakness of extremities Musculoskeletal: no joint pain or swelling Heme: no bleeding, DVT, or easy bruising Endo: no polydipsia or polyuria  BP (!) 122/58   Pulse (!) 57   Ht 6' 1 (1.854 m)   Wt 208 lb 11.2 oz (94.7 kg)   SpO2 95%   BMI 27.53 kg/m   PHYSICAL EXAM: GEN:  AO x 3 in no acute distress HEENT: normal Dentition: Normal Neck: JVP normal. +2 carotid upstrokes without bruits. No thyromegaly. Lungs: equal expansion, clear bilaterally CV: Apex  is discrete and nondisplaced, RRR with 3 out of 6 crescendo decrescendo murmur Abd: soft, non-tender, non-distended; no bruit; positive bowel sounds Ext: no edema, ecchymoses, or cyanosis Vascular: 2+ femoral pulses, 2+ radial pulses       Skin: warm and dry without rash Neuro: CN II-XII grossly intact; motor and sensory grossly intact    DATA AND STUDIES:  EKG: 2025 sinus rhythm with first-degree AV block  EKG Interpretation Date/Time:  Wednesday February 26 2024 13:22:13 EST Ventricular Rate:  57 PR Interval:  232 QRS Duration:  106 QT Interval:  442 QTC Calculation: 430 R Axis:   28  Text Interpretation: Sinus bradycardia with 1st degree A-V block with occasional Premature ventricular complexes When compared with ECG of 20-Jan-2024 09:24, Premature ventricular complexes are now Present Confirmed by Wendel Haws ([id number]) on 02/26/2024 1:32:16 PM        CARDIAC STUDIES: Refer to CV Procedures and Imaging Tabs  01/23/2024: ALT 20; BUN 16; Creatinine, Ser 0.94; Potassium 3.9; Sodium 141 01/30/2024: Hemoglobin 11.6; Platelets 194 02/24/2024: TSH 7.850   STS RISK CALCULATOR: Pending  NYHA CLASS: 2    ASSESSMENT AND PLAN:   1. Nonrheumatic aortic valve stenosis   2. Diastolic dysfunction   3. Paroxysmal atrial fibrillation (HCC)  4. Hypercoagulable state due to paroxysmal atrial fibrillation (HCC)   5. Primary hypertension   6. Hyperlipidemia LDL goal <70   7. Aortic atherosclerosis   8. CKD (chronic kidney disease) stage 2, GFR 60-89 ml/min   9. Pre-procedure lab exam     Aortic stenosis: Patient has developed NYHA II symptoms of shortness of breath.  He tells me when compared to a year ago he is not able to play golf and he would very much like to get back to doing this.  I reviewed his echocardiogram which demonstrates severely calcified valve with restricted leaflet motion.  Will refer for coronary angiography, right heart catheterization, CTA, and cardiothoracic  surgical opinion. Diastolic dysfunction: Continue Coreg  twice daily twice daily, intolerant to ACE inhibitors or spironolactone.  Consider trial of ARB and SGLT2 inhibitor. Paroxysmal atrial fibrillation: Continue Xarelto  20 mg daily, Coreg  12.5 mg twice daily Hypercoagulable state: Continue Xarelto  20 mg daily Hypertension: BP is well-controlled today.  Continue amlodipine  10 mg Hyperlipidemia: Continue atorvastatin  20 mg daily Atherosclerosis: Continue atorvastatin  20 mg daily, continue Xarelto  20 mg daily  CKD stage II: Consider ARB and SGLT2 in future given CKD and diastolic function.   I have personally reviewed the patients imaging data as summarized above.  I have reviewed the natural history of aortic stenosis with the patient and family members who are present today. We have discussed the limitations of medical therapy and the poor prognosis associated with symptomatic aortic stenosis. We have also reviewed potential treatment options, including palliative medical therapy, conventional surgical aortic valve replacement, and transcatheter aortic valve replacement. We discussed treatment options in the context of this patient's specific comorbid medical conditions.   All of the patient's questions were answered today. Will make further recommendations based on the results of studies outlined above.   I spent 45 minutes reviewing all clinical data during and prior to this visit including all relevant imaging studies, laboratories, clinical information from other health systems and prior notes from both Cardiology and other specialties, interviewing the patient, conducting a complete physical examination, and coordinating care in order to formulate a comprehensive and personalized evaluation and treatment plan.   Roxana Lai K Lawyer Washabaugh, MD  02/26/2024 2:16 PM    [facility] [address] [place], [place], [place]  [address] Phone: [phone]; Fax: [phone]        [1]   Allergies Allergen Reactions   Ace Inhibitors Other (See Comments)    High K+    Codeine Other (See Comments)    Hallucinations   Spironolactone Other (See Comments)    Increases potassium

## 2024-02-26 ENCOUNTER — Ambulatory Visit: Attending: Internal Medicine | Admitting: Internal Medicine

## 2024-02-26 ENCOUNTER — Encounter: Payer: Self-pay | Admitting: Internal Medicine

## 2024-02-26 VITALS — BP 122/58 | HR 57 | Ht 73.0 in | Wt 208.7 lb

## 2024-02-26 DIAGNOSIS — I10 Essential (primary) hypertension: Secondary | ICD-10-CM | POA: Insufficient documentation

## 2024-02-26 DIAGNOSIS — E785 Hyperlipidemia, unspecified: Secondary | ICD-10-CM | POA: Insufficient documentation

## 2024-02-26 DIAGNOSIS — Z01812 Encounter for preprocedural laboratory examination: Secondary | ICD-10-CM | POA: Diagnosis present

## 2024-02-26 DIAGNOSIS — I700 Atherosclerosis of aorta: Secondary | ICD-10-CM | POA: Insufficient documentation

## 2024-02-26 DIAGNOSIS — I350 Nonrheumatic aortic (valve) stenosis: Secondary | ICD-10-CM | POA: Diagnosis present

## 2024-02-26 DIAGNOSIS — N182 Chronic kidney disease, stage 2 (mild): Secondary | ICD-10-CM | POA: Diagnosis present

## 2024-02-26 DIAGNOSIS — I5189 Other ill-defined heart diseases: Secondary | ICD-10-CM | POA: Diagnosis present

## 2024-02-26 DIAGNOSIS — I480 Paroxysmal atrial fibrillation: Secondary | ICD-10-CM | POA: Insufficient documentation

## 2024-02-26 DIAGNOSIS — D6869 Other thrombophilia: Secondary | ICD-10-CM | POA: Diagnosis present

## 2024-02-26 LAB — CBC

## 2024-02-26 MED ORDER — AMLODIPINE BESYLATE 10 MG PO TABS: ORAL_TABLET | ORAL | 3 refills | Status: DC

## 2024-02-26 NOTE — Progress Notes (Signed)
 Pre Surgical Assessment: 5 M Walk Test  94M=16.26ft  5 Meter Walk Test- trial 1: 4.23 seconds 5 Meter Walk Test- trial 2: 3.6 seconds 5 Meter Walk Test- trial 3: 3.56 seconds 5 Meter Walk Test Average: 3.8 seconds

## 2024-02-26 NOTE — Patient Instructions (Signed)
 Medication Instructions:  No medication changes were made at this visit. Continue current regimen.   *If you need a refill on your cardiac medications before your next appointment, please call your pharmacy*  Lab Work: To be completed today: CBC and BMP  If you have labs (blood work) drawn today and your tests are completely normal, you will receive your results only by: MyChart Message (if you have MyChart) OR A paper copy in the mail If you have any lab test that is abnormal or we need to change your treatment, we will call you to review the results.  Testing/Procedures: Your physician has requested that you have a cardiac catheterization. Cardiac catheterization is used to diagnose and/or treat various heart conditions. Doctors may recommend this procedure for a number of different reasons. The most common reason is to evaluate chest pain. Chest pain can be a symptom of coronary artery disease (CAD), and cardiac catheterization can show whether plaque is narrowing or blocking your hearts arteries. This procedure is also used to evaluate the valves, as well as measure the blood flow and oxygen levels in different parts of your heart. For further information please visit [email or web address] Please follow instruction sheet, as given.   Follow-Up: At [facility], you and your health needs are our priority.  As part of our continuing mission to provide you with exceptional heart care, our providers are all part of one team.  This team includes your primary Cardiologist (physician) and Advanced Practice Providers or APPs (Physician Assistants and Nurse Practitioners) who all work together to provide you with the care you need, when you need it.  Your next appointment:   Per structural heart team  Provider:   Lurena Red, MD    We recommend signing up for the patient portal called MyChart.  Sign up information is provided on this After Visit Summary.  MyChart is used to connect  with patients for Virtual Visits (Telemedicine).  Patients are able to view lab/test results, encounter notes, upcoming appointments, etc.  Non-urgent messages can be sent to your provider as well.   To learn more about what you can do with MyChart, go to [email or web address].   Other Instructions       Cardiac/Peripheral Catheterization   You are scheduled for a Cardiac Catheterization on Wednesday, December 24 with Dr. Ozell Fell.  1. Please arrive at the [facility] (Main Entrance A) at [facility]: [address] [place], [place] [address] at 8:00 AM (This time is 2 hour(s) before your procedure to ensure your preparation). Your procedure is scheduled to begin at 10 AM.  Free valet parking service is available. You will check in at ADMITTING. The support person will be asked to wait in the waiting room.  It is OK to have someone drop you off and come back when you are ready to be discharged.        Special note: Every effort is made to have your procedure done on time. Please understand that emergencies sometimes delay scheduled procedures.  2. Diet: Nothing to eat after midnight.  3. Hydration:You need to be well hydrated before your procedure. On December 24, you may drink approved liquids (see below) until 2 hours before the procedure, with 16 oz of water  as your last intake.   List of approved liquids water , clear juice, clear tea, black coffee, fruit juices, non-citric and without pulp, carbonated beverages, Gatorade, Kool -Aid, plain Jello-O and plain ice popsicles.  4. Labs: You will need  to have blood drawn on Wednesday, December 17 at [facility] (1st Floor), [address] [place], [place] [address]. You do not need to be fasting.  5. Medication instructions in preparation for your procedure:   Contrast Allergy: No  Stop taking Xarelto  (Rivaroxaban ) on Monday, December 22. Last dose will be Sunday, December  21.  Stop taking, Lasix  (Furosemide )  Tuesday, December 23, last dose will be Monday December 22.  On the morning of your procedure, take Aspirin 81 mg and any morning medicines NOT listed above.  You may use sips of water .  6. Plan to go home the same day, you will only stay overnight if medically necessary. 7. You MUST have a responsible adult to drive you home. 8. An adult MUST be with you the first 24 hours after you arrive home. 9. Bring a current list of your medications, and the last time and date medication taken. 10. Bring ID and current insurance cards. 11.Please wear clothes that are easy to get on and off and wear slip-on shoes.  Thank you for allowing us  to care for you!   -- [facility]

## 2024-02-27 ENCOUNTER — Ambulatory Visit: Payer: Self-pay | Admitting: Internal Medicine

## 2024-02-27 LAB — CBC
Hematocrit: 37.8 % (ref 37.5–51.0)
Hemoglobin: 11.6 g/dL — AB (ref 13.0–17.7)
MCH: 29.1 pg (ref 26.6–33.0)
MCHC: 30.7 g/dL — AB (ref 31.5–35.7)
MCV: 95 fL (ref 79–97)
Platelets: 224 x10E3/uL (ref 150–450)
RBC: 3.98 x10E6/uL — AB (ref 4.14–5.80)
RDW: 13 % (ref 11.6–15.4)
WBC: 6 x10E3/uL (ref 3.4–10.8)

## 2024-02-27 LAB — BASIC METABOLIC PANEL WITH GFR
BUN/Creatinine Ratio: 19 (ref 10–24)
BUN: 19 mg/dL (ref 8–27)
CO2: 28 mmol/L (ref 20–29)
Calcium: 8.7 mg/dL (ref 8.6–10.2)
Chloride: 102 mmol/L (ref 96–106)
Creatinine, Ser: 1.01 mg/dL (ref 0.76–1.27)
Glucose: 99 mg/dL (ref 70–99)
Potassium: 4.4 mmol/L (ref 3.5–5.2)
Sodium: 142 mmol/L (ref 134–144)
eGFR: 77 mL/min/1.73 (ref >59)

## 2024-02-28 ENCOUNTER — Inpatient Hospital Stay: Attending: Hematology & Oncology

## 2024-02-28 ENCOUNTER — Other Ambulatory Visit: Payer: Self-pay | Admitting: *Deleted

## 2024-02-28 ENCOUNTER — Encounter: Payer: Self-pay | Admitting: Hematology & Oncology

## 2024-02-28 ENCOUNTER — Inpatient Hospital Stay

## 2024-02-28 ENCOUNTER — Other Ambulatory Visit: Payer: Self-pay

## 2024-02-28 ENCOUNTER — Ambulatory Visit: Admitting: Hematology & Oncology

## 2024-02-28 VITALS — BP 122/42 | HR 50 | Temp 97.8°F | Resp 16 | Ht 73.0 in | Wt 208.0 lb

## 2024-02-28 DIAGNOSIS — I4891 Unspecified atrial fibrillation: Secondary | ICD-10-CM | POA: Insufficient documentation

## 2024-02-28 DIAGNOSIS — D5912 Cold autoimmune hemolytic anemia: Secondary | ICD-10-CM | POA: Diagnosis not present

## 2024-02-28 DIAGNOSIS — Z7901 Long term (current) use of anticoagulants: Secondary | ICD-10-CM | POA: Diagnosis not present

## 2024-02-28 DIAGNOSIS — Z79899 Other long term (current) drug therapy: Secondary | ICD-10-CM | POA: Insufficient documentation

## 2024-02-28 LAB — CBC WITH DIFFERENTIAL (CANCER CENTER ONLY)
Abs Immature Granulocytes: 0.05 K/uL (ref 0.00–0.07)
Abs Immature Granulocytes: UNDETERMINED K/uL (ref 0.00–0.07)
Band Neutrophils: UNDETERMINED %
Basophils Absolute: 0 K/uL (ref 0.0–0.1)
Basophils Absolute: UNDETERMINED K/uL (ref 0.0–0.1)
Basophils Relative: 1 %
Basophils Relative: UNDETERMINED %
Blasts: UNDETERMINED %
Eosinophils Absolute: 0.1 K/uL (ref 0.0–0.5)
Eosinophils Absolute: UNDETERMINED K/uL (ref 0.0–0.5)
Eosinophils Relative: 1 %
Eosinophils Relative: UNDETERMINED %
HCT: UNDETERMINED % (ref 39.0–52.0)
Hemoglobin: 10.9 g/dL — ABNORMAL LOW (ref 13.0–17.0)
Immature Granulocytes: 1 %
Immature Granulocytes: UNDETERMINED %
Lymphocytes Relative: 31 %
Lymphocytes Relative: UNDETERMINED %
Lymphs Abs: 1.3 K/uL (ref 0.7–4.0)
Lymphs Abs: UNDETERMINED K/uL (ref 0.7–4.0)
MCH: UNDETERMINED pg (ref 26.0–34.0)
MCHC: UNDETERMINED g/dL (ref 30.0–36.0)
MCV: UNDETERMINED fL (ref 80.0–100.0)
Metamyelocytes Relative: UNDETERMINED %
Monocytes Absolute: 0.3 K/uL (ref 0.1–1.0)
Monocytes Absolute: UNDETERMINED K/uL (ref 0.1–1.0)
Monocytes Relative: 7 %
Monocytes Relative: UNDETERMINED %
Myelocytes: UNDETERMINED %
Neutro Abs: 2.5 K/uL (ref 1.7–7.7)
Neutro Abs: UNDETERMINED K/uL (ref 1.7–7.7)
Neutrophils Relative %: 59 %
Neutrophils Relative %: UNDETERMINED %
Other: UNDETERMINED %
Platelet Count: 210 K/uL (ref 150–400)
Promyelocytes Relative: UNDETERMINED %
RBC Morphology: UNDETERMINED
RBC: UNDETERMINED MIL/uL (ref 4.22–5.81)
RDW: UNDETERMINED % (ref 11.5–15.5)
Smear Review: NORMAL
Smear Review: UNDETERMINED
WBC Count: 4.2 K/uL (ref 4.0–10.5)
WBC Morphology: UNDETERMINED
nRBC: 0 % (ref 0.0–0.2)
nRBC: UNDETERMINED /100{WBCs}

## 2024-02-28 LAB — CMP (CANCER CENTER ONLY)
ALT: 28 U/L (ref 0–44)
AST: 27 U/L (ref 15–41)
Albumin: 4.2 g/dL (ref 3.5–5.0)
Alkaline Phosphatase: 73 U/L (ref 38–126)
Anion gap: 9 (ref 5–15)
BUN: 16 mg/dL (ref 8–23)
CO2: 30 mmol/L (ref 22–32)
Calcium: 9 mg/dL (ref 8.9–10.3)
Chloride: 104 mmol/L (ref 98–111)
Creatinine: 1.03 mg/dL (ref 0.61–1.24)
GFR, Estimated: >60 mL/min
Glucose, Bld: 176 mg/dL — ABNORMAL HIGH (ref 70–99)
Potassium: 4.6 mmol/L (ref 3.5–5.1)
Sodium: 142 mmol/L (ref 135–145)
Total Bilirubin: 2.1 mg/dL — ABNORMAL HIGH (ref 0.0–1.2)
Total Protein: 6.7 g/dL (ref 6.5–8.1)

## 2024-02-28 LAB — SAVE SMEAR(SSMR), FOR PROVIDER SLIDE REVIEW

## 2024-02-28 LAB — RETICULOCYTES
Immature Retic Fract: 20.3 % — ABNORMAL HIGH (ref 2.3–15.9)
RBC.: 3.3 MIL/uL — ABNORMAL LOW (ref 4.22–5.81)
Retic Count, Absolute: 90 K/uL (ref 19.0–186.0)
Retic Ct Pct: 2.7 % (ref 0.4–3.1)

## 2024-02-28 LAB — LACTATE DEHYDROGENASE: LDH: 171 U/L (ref 105–235)

## 2024-02-28 NOTE — Progress Notes (Signed)
 " Hematology and Oncology Follow Up Visit  [place] Monds [id number] 1945-07-14 78 y.o. 02/28/2024   Principle Diagnosis:  Cold agglutinin disease  Current Therapy:   Folic acid 2 mg p.o. daily      Interim History:  Mr. Bressman is  back for another visit.  We see him every 6 months.  News is that he is going to have a TAVR procedure for his aortic valve.  He is not sure when this is done be done.  He is good have a cardiac cath on Christmas Eve.  Otherwise, he is doing okay.  [place]  He just came back I think yesterday.  He is on folic acid.  He is doing well with the folic acid.  Continues on Eliquis for that his atrial fibrillation.  He has had no bleeding.  He is on no change in bowel or bladder habits.  His last cold agglutinin titer was Negative.  He has had no cough or shortness of breath.  He has had no headache.  Overall, I will say that his performance status is probably ECOG 1.   Medications:  Current Outpatient Medications:    acetaminophen  (TYLENOL ) 500 MG tablet, Take 500-1,000 mg by mouth every 6 (six) hours as needed (pain.)., Disp: , Rfl:    amiodarone  (PACERONE ) 200 MG tablet, Take 1 tablet (200 mg total) by mouth daily., Disp: 90 tablet, Rfl: 3   amLODipine  (NORVASC ) 10 MG tablet, TAKE 1 TABLET(10 MG) BY MOUTH DAILY, Disp: 90 tablet, Rfl: 3   amoxicillin (AMOXIL) 500 MG capsule, Take 2,000 mg by mouth See admin instructions. Take 4 capsule, Disp: , Rfl:    AMOXICILLIN PO, Take by mouth. Before dental work only, Disp: , Rfl:    atorvastatin  (LIPITOR) 20 MG tablet, TAKE 1 TABLET(20 MG) BY MOUTH DAILY (Patient taking differently: Take 20 mg by mouth at bedtime. TAKE 1 TABLET(20 MG) BY MOUTH DAILY), Disp: 90 tablet, Rfl: 1   carvedilol  (COREG ) 12.5 MG tablet, TAKE 1 TABLET(12.5 MG) BY MOUTH TWICE DAILY WITH A MEAL, Disp: 180 tablet, Rfl: 3   fexofenadine (ALLEGRA) 180 MG tablet, Take 180 mg by mouth in the morning., Disp: , Rfl:     folic acid (FOLVITE) 800 MCG tablet, Take 800 mcg by mouth 2 (two) times daily., Disp: , Rfl:    furosemide  (LASIX ) 40 MG tablet, TAKE 1 TABLET BY MOUTH TWICE DAILY AT 8 AM AND AT 2 PM, Disp: 180 tablet, Rfl: 4   Krill Oil 500 MG CAPS, Take 500 mg by mouth daily with supper., Disp: , Rfl:    Misc Natural Product Nasal (NASAL CLEANSE RINSE MIX NA), Place into the nose in the morning. [employer] Sinus Rinse, Disp: , Rfl:    Multiple Vitamins-Minerals (EMERGEN-C IMMUNE PLUS PO), Take 1 packet by mouth daily., Disp: , Rfl:    Multiple Vitamins-Minerals (ONE-A-DAY MENS 50+) TABS, Take 1 tablet by mouth daily with breakfast., Disp: , Rfl:    OVER THE COUNTER MEDICATION, Take 2 capsules by mouth daily. [facility] [employer] Gas and Bloating Relief, Disp: , Rfl:    potassium chloride  SA (KLOR-CON  M) 20 MEQ tablet, TAKE 1 TABLET(20 MEQ) BY MOUTH AT 8 AM AND AT 2 PM AS DIRECTED, Disp: 180 tablet, Rfl: 3   PRESCRIPTION MEDICATION, Pt has CPAP Machine, Disp: , Rfl:    Psyllium (METAMUCIL FIBER PO), Take 1 Dose by mouth daily as needed (regularity)., Disp: , Rfl:    tamsulosin  (FLOMAX ) 0.4  MG CAPS capsule, Take 1 capsule (0.4 mg total) by mouth every morning. (Patient taking differently: Take 0.4 mg by mouth at bedtime.), Disp: 90 capsule, Rfl: 1   XARELTO  20 MG TABS tablet, TAKE 1 TABLET(20 MG) BY MOUTH DAILY WITH SUPPER, Disp: 90 tablet, Rfl: 1  Allergies:  Allergies  Allergen Reactions   Ace Inhibitors Other (See Comments)    High K+    Codeine Other (See Comments)    Hallucinations   Spironolactone Other (See Comments)    Increases potassium    Past Medical History, Surgical history, Social history, and Family History were reviewed and updated.  Review of Systems: Review of Systems  Constitutional: Negative.   HENT:  Negative.    Eyes: Negative.   Respiratory: Negative.    Cardiovascular: Negative.   Gastrointestinal: Negative.   Endocrine: Negative.   Genitourinary: Negative.     Musculoskeletal: Negative.   Skin: Negative.   Neurological: Negative.   Hematological: Negative.   Psychiatric/Behavioral: Negative.      Physical Exam:  Vital signs show a temperature is 97.8.  Pulse 50.  Blood pressure 122/42.  Weight is 208 pounds.  Wt Readings from Last 3 Encounters:  02/26/24 208 lb 11.2 oz (94.7 kg)  02/04/24 199 lb 3.2 oz (90.4 kg)  01/23/24 205 lb (93 kg)    Physical Exam Vitals reviewed.  HENT:     Head: Normocephalic and atraumatic.  Eyes:     Pupils: Pupils are equal, round, and reactive to light.  Cardiovascular:     Rate and Rhythm: Normal rate and regular rhythm.     Heart sounds: Normal heart sounds.     Comments: Cardiac exam is regular rate and rhythm.  He has a 4/6 systolic ejection murmur consistent with aortic stenosis. Pulmonary:     Effort: Pulmonary effort is normal.     Breath sounds: Normal breath sounds.  Abdominal:     General: Bowel sounds are normal.     Palpations: Abdomen is soft.  Musculoskeletal:        General: No tenderness or deformity. Normal range of motion.     Cervical back: Normal range of motion.     Comments: His extremities does show some chronic 1-2+ edema.  Lymphadenopathy:     Cervical: No cervical adenopathy.  Skin:    General: Skin is warm and dry.     Findings: No erythema or rash.  Neurological:     Mental Status: He is alert and oriented to person, place, and time.  Psychiatric:        Behavior: Behavior normal.        Thought Content: Thought content normal.        Judgment: Judgment normal.      Lab Results  Component Value Date   WBC 4.3 02/28/2024   WBC 4.2 02/28/2024   HGB 10.9 (L) 02/28/2024   HGB 10.9 (L) 02/28/2024   HCT 15.0 (L) 02/28/2024   HCT Unable to determine due to a cold agglutinin 02/28/2024   MCV 104.9 (H) 02/28/2024   MCV Unable to determine due to a cold agglutinin 02/28/2024   PLT 194 02/28/2024   PLT 210 02/28/2024     Chemistry      Component Value  Date/Time   NA 142 02/28/2024 1001   NA 142 02/26/2024 1433   K 4.6 02/28/2024 1001   CL 104 02/28/2024 1001   CO2 30 02/28/2024 1001   BUN 16 02/28/2024 1001   BUN 19 02/26/2024  1433   CREATININE 1.03 02/28/2024 1001   CREATININE 0.89 01/13/2020 0906      Component Value Date/Time   CALCIUM  9.0 02/28/2024 1001   ALKPHOS 73 02/28/2024 1001   AST 27 02/28/2024 1001   ALT 28 02/28/2024 1001   BILITOT 2.1 (H) 02/28/2024 1001      Impression and Plan: Mr. Liller is a very nice 78 year old white male.  He has cold agglutinin disease.  As far as I can tell, this is idiopathic.  There is no associated lymphoproliferative disease or autoimmune disease that we have been able to find..  I really do not see a problem with him having the TAVR procedure.  From my point of view, I do not see that there should be any issue with respect to the cold agglutinin.  It sounds like this TAVR will be done in January or early February.  We will plan to get him back in another 6 months.    Maude JONELLE Crease, MD 12/19/202511:24 AM "

## 2024-03-02 LAB — COLD AGGLUTININ TITER: Cold Agglutinin Titer: 1:4096 {titer} — ABNORMAL HIGH

## 2024-03-03 ENCOUNTER — Other Ambulatory Visit: Payer: Self-pay

## 2024-03-03 DIAGNOSIS — I350 Nonrheumatic aortic (valve) stenosis: Secondary | ICD-10-CM

## 2024-03-04 ENCOUNTER — Other Ambulatory Visit: Payer: Self-pay

## 2024-03-04 ENCOUNTER — Encounter (HOSPITAL_COMMUNITY)
Admission: RE | Disposition: A | Discharge status: Home / Self Care | Payer: Self-pay | Source: Home / Self Care | Attending: Cardiovascular Disease

## 2024-03-04 ENCOUNTER — Ambulatory Visit (HOSPITAL_COMMUNITY)
Admission: RE | Admit: 2024-03-04 | Discharge: 2024-03-04 | Disposition: A | Discharge status: Home / Self Care | Attending: Cardiovascular Disease | Admitting: Cardiovascular Disease

## 2024-03-04 DIAGNOSIS — I2510 Atherosclerotic heart disease of native coronary artery without angina pectoris: Secondary | ICD-10-CM | POA: Diagnosis not present

## 2024-03-04 DIAGNOSIS — E785 Hyperlipidemia, unspecified: Secondary | ICD-10-CM | POA: Diagnosis not present

## 2024-03-04 DIAGNOSIS — Z7901 Long term (current) use of anticoagulants: Secondary | ICD-10-CM | POA: Insufficient documentation

## 2024-03-04 DIAGNOSIS — I440 Atrioventricular block, first degree: Secondary | ICD-10-CM | POA: Diagnosis not present

## 2024-03-04 DIAGNOSIS — I350 Nonrheumatic aortic (valve) stenosis: Secondary | ICD-10-CM | POA: Diagnosis present

## 2024-03-04 DIAGNOSIS — I480 Paroxysmal atrial fibrillation: Secondary | ICD-10-CM | POA: Insufficient documentation

## 2024-03-04 DIAGNOSIS — I2584 Coronary atherosclerosis due to calcified coronary lesion: Secondary | ICD-10-CM | POA: Insufficient documentation

## 2024-03-04 DIAGNOSIS — N182 Chronic kidney disease, stage 2 (mild): Secondary | ICD-10-CM | POA: Diagnosis not present

## 2024-03-04 DIAGNOSIS — D5912 Cold autoimmune hemolytic anemia: Secondary | ICD-10-CM | POA: Insufficient documentation

## 2024-03-04 DIAGNOSIS — I1310 Hypertensive heart and chronic kidney disease without heart failure, with stage 1 through stage 4 chronic kidney disease, or unspecified chronic kidney disease: Secondary | ICD-10-CM | POA: Diagnosis not present

## 2024-03-04 DIAGNOSIS — I700 Atherosclerosis of aorta: Secondary | ICD-10-CM | POA: Diagnosis not present

## 2024-03-04 DIAGNOSIS — D6869 Other thrombophilia: Secondary | ICD-10-CM | POA: Diagnosis not present

## 2024-03-04 DIAGNOSIS — Z87891 Personal history of nicotine dependence: Secondary | ICD-10-CM | POA: Insufficient documentation

## 2024-03-04 DIAGNOSIS — R600 Localized edema: Secondary | ICD-10-CM | POA: Diagnosis not present

## 2024-03-04 DIAGNOSIS — Z79899 Other long term (current) drug therapy: Secondary | ICD-10-CM | POA: Insufficient documentation

## 2024-03-04 DIAGNOSIS — G4733 Obstructive sleep apnea (adult) (pediatric): Secondary | ICD-10-CM | POA: Diagnosis not present

## 2024-03-04 HISTORY — PX: RIGHT HEART CATH AND CORONARY/GRAFT ANGIOGRAPHY: CATH118265

## 2024-03-04 SURGERY — RIGHT HEART CATH AND CORONARY/GRAFT ANGIOGRAPHY
Anesthesia: LOCAL

## 2024-03-04 MED ORDER — FENTANYL CITRATE (PF) 100 MCG/2ML IJ SOLN
INTRAMUSCULAR | Status: AC
  Filled 2024-03-04: qty 2

## 2024-03-04 MED ORDER — VERAPAMIL HCL 2.5 MG/ML IV SOLN
INTRAVENOUS | Status: DC | PRN
  Administered 2024-03-04: 10 mL via INTRA_ARTERIAL

## 2024-03-04 MED ORDER — FREE WATER: 500.0000 mL | Freq: Once | Status: DC

## 2024-03-04 MED ORDER — SODIUM CHLORIDE 0.9% FLUSH: 3.0000 mL | Freq: Two times a day (BID) | INTRAVENOUS | Status: DC

## 2024-03-04 MED ORDER — MIDAZOLAM HCL 2 MG/2ML IJ SOLN
INTRAMUSCULAR | Status: AC
  Filled 2024-03-04: qty 2

## 2024-03-04 MED ORDER — HEPARIN SODIUM (PORCINE) 1000 UNIT/ML IJ SOLN
INTRAMUSCULAR | Status: AC
  Filled 2024-03-04: qty 10

## 2024-03-04 MED ORDER — HYDRALAZINE HCL 20 MG/ML IJ SOLN: 10.0000 mg | INTRAMUSCULAR | Status: DC | PRN

## 2024-03-04 MED ORDER — ACETAMINOPHEN 325 MG PO TABS: 650.0000 mg | ORAL_TABLET | ORAL | Status: DC | PRN

## 2024-03-04 MED ORDER — ONDANSETRON HCL 4 MG/2ML IJ SOLN: 4.0000 mg | Freq: Four times a day (QID) | INTRAMUSCULAR | Status: DC | PRN

## 2024-03-04 MED ORDER — SODIUM CHLORIDE 0.9% FLUSH: 3.0000 mL | INTRAVENOUS | Status: DC | PRN

## 2024-03-04 MED ORDER — MIDAZOLAM HCL (PF) 2 MG/2ML IJ SOLN
INTRAMUSCULAR | Status: DC | PRN
  Administered 2024-03-04: 2 mg via INTRAVENOUS

## 2024-03-04 MED ORDER — ASPIRIN 81 MG PO CHEW: 81.0000 mg | CHEWABLE_TABLET | ORAL | Status: DC

## 2024-03-04 MED ORDER — SODIUM CHLORIDE 0.9 % IV SOLN: 250.0000 mL | INTRAVENOUS | Status: DC | PRN

## 2024-03-04 MED ORDER — HEPARIN SODIUM (PORCINE) 1000 UNIT/ML IJ SOLN
INTRAMUSCULAR | Status: DC | PRN
  Administered 2024-03-04: 5000 [IU] via INTRAVENOUS

## 2024-03-04 MED ORDER — LIDOCAINE HCL (PF) 1 % IJ SOLN
INTRAMUSCULAR | Status: DC | PRN
  Administered 2024-03-04 (×2): 2 mL

## 2024-03-04 MED ORDER — HEPARIN (PORCINE) IN NACL 1000-0.9 UT/500ML-% IV SOLN
INTRAVENOUS | Status: DC | PRN
  Administered 2024-03-04: 1000 mL

## 2024-03-04 MED ORDER — VERAPAMIL HCL 2.5 MG/ML IV SOLN
INTRAVENOUS | Status: AC
  Filled 2024-03-04: qty 2

## 2024-03-04 MED ORDER — IOHEXOL 350 MG/ML SOLN
INTRAVENOUS | Status: DC | PRN
  Administered 2024-03-04: 50 mL

## 2024-03-04 MED ORDER — LIDOCAINE HCL (PF) 1 % IJ SOLN
INTRAMUSCULAR | Status: AC
  Filled 2024-03-04: qty 30

## 2024-03-04 MED ORDER — FREE WATER
500.0000 mL | Freq: Once | Status: AC
  Administered 2024-03-04: 500 mL via ORAL

## 2024-03-04 MED ORDER — LABETALOL HCL 5 MG/ML IV SOLN: 10.0000 mg | INTRAVENOUS | Status: DC | PRN

## 2024-03-04 MED ORDER — FENTANYL CITRATE (PF) 100 MCG/2ML IJ SOLN
INTRAMUSCULAR | Status: DC | PRN
  Administered 2024-03-04: 25 ug via INTRAVENOUS

## 2024-03-04 SURGICAL SUPPLY — 10 items
CATH 5FR JL3.5 JR4 ANG PIG MP (CATHETERS) IMPLANT
CATH BALLN WEDGE 5F 110CM (CATHETERS) IMPLANT
DEVICE RAD COMP TR BAND LRG (VASCULAR PRODUCTS) IMPLANT
GLIDESHEATH SLEND SS 6F .021 (SHEATH) IMPLANT
GUIDEWIRE .025 260CM (WIRE) IMPLANT
GUIDEWIRE INQWIRE 1.5J.035X260 (WIRE) IMPLANT
KIT SINGLE USE MANIFOLD (KITS) IMPLANT
PACK CARDIAC CATHETERIZATION (CUSTOM PROCEDURE TRAY) ×1 IMPLANT
SET ATX-X65L (MISCELLANEOUS) IMPLANT
SHEATH GLIDE SLENDER 4/5FR (SHEATH) IMPLANT

## 2024-03-04 NOTE — Interval H&P Note (Signed)
 History and Physical Interval Note:  03/04/2024 9:22 AM  Steven Shepard  has presented today for surgery, with the diagnosis of aortic stenosis.  The various methods of treatment have been discussed with the patient and family. After consideration of risks, benefits and other options for treatment, the patient has consented to  Procedures: RIGHT/LEFT HEART CATH AND CORONARY ANGIOGRAPHY (N/A) as a surgical intervention.  The patient's history has been reviewed, patient examined, no change in status, stable for surgery.  I have reviewed the patient's chart and labs.  Questions were answered to the patient's satisfaction.     Steven Shepard

## 2024-03-04 NOTE — Discharge Instructions (Signed)

## 2024-03-04 NOTE — Progress Notes (Signed)
 Pt and wife received discharge instructions, teach back performed. Iv removed, no complications. Rt radial site is clean dry intact, site is soft, no signs of bleeding. Pt escorted out via wheelchair to wife's vehicle.

## 2024-03-05 ENCOUNTER — Encounter (HOSPITAL_COMMUNITY): Payer: Self-pay | Admitting: Cardiovascular Disease

## 2024-03-06 LAB — POCT I-STAT 7, (LYTES, BLD GAS, ICA,H+H)
Acid-Base Excess: 3 mmol/L — ABNORMAL HIGH (ref 0.0–2.0)
Bicarbonate: 28.5 mmol/L — ABNORMAL HIGH (ref 20.0–28.0)
Calcium, Ion: 1.28 mmol/L (ref 1.15–1.40)
HCT: 32 % — ABNORMAL LOW (ref 39.0–52.0)
Hemoglobin: 10.9 g/dL — ABNORMAL LOW (ref 13.0–17.0)
O2 Saturation: 89 %
Potassium: 3.6 mmol/L (ref 3.5–5.1)
Sodium: 143 mmol/L (ref 135–145)
TCO2: 30 mmol/L (ref 22–32)
pCO2 arterial: 47.3 mmHg (ref 32–48)
pH, Arterial: 7.389 (ref 7.35–7.45)
pO2, Arterial: 57 mmHg — ABNORMAL LOW (ref 83–108)

## 2024-03-06 LAB — POCT I-STAT EG7
Acid-Base Excess: 3 mmol/L — ABNORMAL HIGH (ref 0.0–2.0)
Bicarbonate: 29.2 mmol/L — ABNORMAL HIGH (ref 20.0–28.0)
Calcium, Ion: 1.27 mmol/L (ref 1.15–1.40)
HCT: 35 % — ABNORMAL LOW (ref 39.0–52.0)
Hemoglobin: 11.9 g/dL — ABNORMAL LOW (ref 13.0–17.0)
O2 Saturation: 67 %
Potassium: 3.7 mmol/L (ref 3.5–5.1)
Sodium: 144 mmol/L (ref 135–145)
TCO2: 31 mmol/L (ref 22–32)
pCO2, Ven: 50.1 mmHg (ref 44–60)
pH, Ven: 7.373 (ref 7.25–7.43)
pO2, Ven: 36 mmHg (ref 32–45)

## 2024-03-11 ENCOUNTER — Other Ambulatory Visit: Payer: Self-pay | Admitting: Family Medicine

## 2024-03-31 ENCOUNTER — Other Ambulatory Visit: Payer: Self-pay | Admitting: Family Medicine

## 2024-04-01 ENCOUNTER — Ambulatory Visit: Payer: Self-pay | Admitting: Internal Medicine

## 2024-04-01 ENCOUNTER — Ambulatory Visit (HOSPITAL_COMMUNITY)
Admission: RE | Admit: 2024-04-01 | Discharge: 2024-04-01 | Disposition: A | Discharge status: Home / Self Care | Source: Ambulatory Visit | Attending: Cardiology | Admitting: Cardiology

## 2024-04-01 DIAGNOSIS — I350 Nonrheumatic aortic (valve) stenosis: Secondary | ICD-10-CM | POA: Insufficient documentation

## 2024-04-01 MED ORDER — IOHEXOL 350 MG/ML SOLN
100.0000 mL | Freq: Once | INTRAVENOUS | Status: AC | PRN
  Administered 2024-04-01: 100 mL via INTRAVENOUS

## 2024-04-01 NOTE — Progress Notes (Signed)
 Procedure Type: Isolated AVR Perioperative Outcome Estimate % Operative Mortality 1.17% Morbidity & Mortality 6.31% Stroke 0.837% Renal Failure 1.44% Reoperation 3.37% Prolonged Ventilation 2.88% Deep Sternal Wound Infection 0.049% Long Hospital Stay (>14 days) 3.23% Short Hospital Stay (<6 days)* 45.2%

## 2024-04-10 ENCOUNTER — Ambulatory Visit
Attending: Thoracic Surgery (Cardiothoracic Vascular Surgery) | Admitting: Thoracic Surgery (Cardiothoracic Vascular Surgery)

## 2024-04-10 VITALS — BP 147/71 | HR 55 | Resp 18 | Ht 73.0 in | Wt 212.0 lb

## 2024-04-10 DIAGNOSIS — I350 Nonrheumatic aortic (valve) stenosis: Secondary | ICD-10-CM | POA: Insufficient documentation

## 2024-04-15 ENCOUNTER — Other Ambulatory Visit: Payer: Self-pay | Admitting: Family Medicine

## 2024-04-17 ENCOUNTER — Other Ambulatory Visit: Payer: Self-pay

## 2024-04-17 DIAGNOSIS — I350 Nonrheumatic aortic (valve) stenosis: Secondary | ICD-10-CM

## 2024-04-23 ENCOUNTER — Ambulatory Visit

## 2024-04-24 ENCOUNTER — Other Ambulatory Visit (HOSPITAL_COMMUNITY)

## 2024-04-27 ENCOUNTER — Ambulatory Visit: Payer: Medicare Other

## 2024-04-28 ENCOUNTER — Encounter (HOSPITAL_COMMUNITY): Payer: Self-pay

## 2024-04-28 ENCOUNTER — Inpatient Hospital Stay (HOSPITAL_COMMUNITY): Admit: 2024-04-28 | Admitting: Internal Medicine

## 2024-04-28 DIAGNOSIS — I350 Nonrheumatic aortic (valve) stenosis: Secondary | ICD-10-CM

## 2024-08-28 ENCOUNTER — Inpatient Hospital Stay: Admitting: Hematology & Oncology

## 2024-08-28 ENCOUNTER — Inpatient Hospital Stay

## 2025-01-26 ENCOUNTER — Ambulatory Visit: Admitting: Family Medicine
